# Patient Record
Sex: Female | Born: 1980 | Race: White | Hispanic: No | Smoking: Former smoker
Health system: Southern US, Community
[De-identification: ages and names within clinical notes are randomized; demographics above are authoritative.]

## PROBLEM LIST (undated history)

## (undated) VITALS — BP 106/59 | HR 104 | Temp 97.9°F | Resp 16 | Ht 65.5 in | Wt 258.0 lb

## (undated) DIAGNOSIS — F10931 Alcohol use, unspecified with withdrawal delirium: Secondary | ICD-10-CM

## (undated) DIAGNOSIS — F1010 Alcohol abuse, uncomplicated: Secondary | ICD-10-CM

## (undated) DIAGNOSIS — K259 Gastric ulcer, unspecified as acute or chronic, without hemorrhage or perforation: Secondary | ICD-10-CM

## (undated) DIAGNOSIS — F32A Depression, unspecified: Secondary | ICD-10-CM

## (undated) DIAGNOSIS — K219 Gastro-esophageal reflux disease without esophagitis: Secondary | ICD-10-CM

## (undated) DIAGNOSIS — F419 Anxiety disorder, unspecified: Secondary | ICD-10-CM

## (undated) DIAGNOSIS — F1910 Other psychoactive substance abuse, uncomplicated: Secondary | ICD-10-CM

## (undated) DIAGNOSIS — G4700 Insomnia, unspecified: Secondary | ICD-10-CM

## (undated) DIAGNOSIS — G35 Multiple sclerosis: Secondary | ICD-10-CM

## (undated) DIAGNOSIS — F319 Bipolar disorder, unspecified: Secondary | ICD-10-CM

## (undated) DIAGNOSIS — F329 Major depressive disorder, single episode, unspecified: Secondary | ICD-10-CM

## (undated) DIAGNOSIS — Z9289 Personal history of other medical treatment: Secondary | ICD-10-CM

## (undated) DIAGNOSIS — F10231 Alcohol dependence with withdrawal delirium: Secondary | ICD-10-CM

## (undated) DIAGNOSIS — R110 Nausea: Secondary | ICD-10-CM

## (undated) DIAGNOSIS — G8929 Other chronic pain: Secondary | ICD-10-CM

## (undated) DIAGNOSIS — K529 Noninfective gastroenteritis and colitis, unspecified: Secondary | ICD-10-CM

## (undated) DIAGNOSIS — G43909 Migraine, unspecified, not intractable, without status migrainosus: Secondary | ICD-10-CM

## (undated) HISTORY — DX: Other chronic pain: G89.29

## (undated) HISTORY — DX: Nausea: R11.0

## (undated) HISTORY — DX: Anxiety disorder, unspecified: F41.9

## (undated) HISTORY — PX: GASTRIC BYPASS: SHX52

## (undated) HISTORY — DX: Migraine, unspecified, not intractable, without status migrainosus: G43.909

## (undated) HISTORY — PX: DG TEETH FULL: HXRAD118

## (undated) HISTORY — DX: Multiple sclerosis: G35

## (undated) HISTORY — DX: Insomnia, unspecified: G47.00

## (undated) HISTORY — DX: Personal history of other medical treatment: Z92.89

## (undated) HISTORY — DX: Noninfective gastroenteritis and colitis, unspecified: K52.9

## (undated) HISTORY — DX: Bipolar disorder, unspecified: F31.9

## (undated) HISTORY — DX: Gastric ulcer, unspecified as acute or chronic, without hemorrhage or perforation: K25.9

## (undated) HISTORY — PX: BACK SURGERY: SHX140

---

## 2001-10-29 HISTORY — PX: DIAGNOSTIC LAPAROSCOPY: SUR761

## 2003-10-30 DIAGNOSIS — G35 Multiple sclerosis: Secondary | ICD-10-CM

## 2003-10-30 HISTORY — DX: Multiple sclerosis: G35

## 2009-08-02 ENCOUNTER — Emergency Department (HOSPITAL_COMMUNITY): Admission: EM | Admit: 2009-08-02 | Discharge: 2009-08-02 | Payer: Self-pay | Admitting: Emergency Medicine

## 2009-11-09 ENCOUNTER — Ambulatory Visit: Payer: Self-pay | Admitting: Internal Medicine

## 2009-11-09 ENCOUNTER — Encounter (INDEPENDENT_AMBULATORY_CARE_PROVIDER_SITE_OTHER): Payer: Self-pay | Admitting: Family Medicine

## 2009-11-09 LAB — CONVERTED CEMR LAB
AST: 38 units/L — ABNORMAL HIGH (ref 0–37)
Alkaline Phosphatase: 88 units/L (ref 39–117)
Anti Nuclear Antibody(ANA): NEGATIVE
BUN: 11 mg/dL (ref 6–23)
Calcium: 9.3 mg/dL (ref 8.4–10.5)
Chloride: 101 meq/L (ref 96–112)
Creatinine, Ser: 0.74 mg/dL (ref 0.40–1.20)
Eosinophils Relative: 2 % (ref 0–5)
Glucose, Bld: 71 mg/dL (ref 70–99)
HCT: 36.1 % (ref 36.0–46.0)
Lymphocytes Relative: 29 % (ref 12–46)
MCV: 77.3 fL — ABNORMAL LOW (ref 78.0–100.0)
Monocytes Absolute: 0.6 10*3/uL (ref 0.1–1.0)
Monocytes Relative: 7 % (ref 3–12)
Platelets: 431 10*3/uL — ABNORMAL HIGH (ref 150–400)
Potassium: 4.4 meq/L (ref 3.5–5.3)
Rhuematoid fact SerPl-aCnc: <20 intl units/mL (ref 0–20)
Sed Rate: 8 mm/hr (ref 0–22)
Sodium: 137 meq/L (ref 135–145)
Total CK: 64 units/L (ref 7–177)
Total Protein: 7 g/dL (ref 6.0–8.3)

## 2009-12-29 ENCOUNTER — Ambulatory Visit: Payer: Self-pay | Admitting: Internal Medicine

## 2010-02-13 ENCOUNTER — Encounter (INDEPENDENT_AMBULATORY_CARE_PROVIDER_SITE_OTHER): Payer: Self-pay | Admitting: Family Medicine

## 2010-02-13 ENCOUNTER — Ambulatory Visit: Payer: Self-pay | Admitting: Internal Medicine

## 2010-02-13 LAB — CONVERTED CEMR LAB
Cholesterol: 147 mg/dL (ref 0–200)
Triglycerides: 62 mg/dL (ref <150)
VLDL: 12 mg/dL (ref 0–40)

## 2010-02-23 ENCOUNTER — Ambulatory Visit: Payer: Self-pay | Admitting: Family Medicine

## 2010-03-01 ENCOUNTER — Ambulatory Visit (HOSPITAL_COMMUNITY): Admission: RE | Admit: 2010-03-01 | Discharge: 2010-03-01 | Payer: Self-pay | Admitting: Family Medicine

## 2010-03-07 ENCOUNTER — Ambulatory Visit (HOSPITAL_COMMUNITY): Admission: RE | Admit: 2010-03-07 | Discharge: 2010-03-07 | Payer: Self-pay | Admitting: Family Medicine

## 2010-03-16 ENCOUNTER — Ambulatory Visit: Payer: Self-pay | Admitting: Family Medicine

## 2010-04-25 ENCOUNTER — Ambulatory Visit: Payer: Self-pay | Admitting: Internal Medicine

## 2010-04-25 ENCOUNTER — Encounter (INDEPENDENT_AMBULATORY_CARE_PROVIDER_SITE_OTHER): Payer: Self-pay | Admitting: Family Medicine

## 2010-04-25 LAB — CONVERTED CEMR LAB
Hemoglobin: 12.2 g/dL (ref 12.0–15.0)
Lymphocytes Relative: 23 % (ref 12–46)
MCV: 79.6 fL (ref 78.0–100.0)
Monocytes Relative: 9 % (ref 3–12)
Neutro Abs: 5.6 10*3/uL (ref 1.7–7.7)
Neutrophils Relative %: 66 % (ref 43–77)
RDW: 14.3 % (ref 11.5–15.5)

## 2010-12-03 ENCOUNTER — Emergency Department (HOSPITAL_COMMUNITY)
Admission: EM | Admit: 2010-12-03 | Discharge: 2010-12-03 | Disposition: A | Discharge status: Home / Self Care | Payer: Self-pay | Attending: Emergency Medicine | Admitting: Emergency Medicine

## 2010-12-03 DIAGNOSIS — N12 Tubulo-interstitial nephritis, not specified as acute or chronic: Secondary | ICD-10-CM | POA: Insufficient documentation

## 2010-12-03 DIAGNOSIS — R319 Hematuria, unspecified: Secondary | ICD-10-CM | POA: Insufficient documentation

## 2010-12-03 DIAGNOSIS — R509 Fever, unspecified: Secondary | ICD-10-CM | POA: Insufficient documentation

## 2010-12-03 DIAGNOSIS — R109 Unspecified abdominal pain: Secondary | ICD-10-CM | POA: Insufficient documentation

## 2010-12-03 DIAGNOSIS — R112 Nausea with vomiting, unspecified: Secondary | ICD-10-CM | POA: Insufficient documentation

## 2010-12-03 DIAGNOSIS — R300 Dysuria: Secondary | ICD-10-CM | POA: Insufficient documentation

## 2010-12-03 DIAGNOSIS — G35 Multiple sclerosis: Secondary | ICD-10-CM | POA: Insufficient documentation

## 2010-12-03 LAB — URINALYSIS, ROUTINE W REFLEX MICROSCOPIC
Bilirubin Urine: NEGATIVE
Nitrite: NEGATIVE
Protein, ur: NEGATIVE mg/dL
Specific Gravity, Urine: 1.012 (ref 1.005–1.030)
Urine Glucose, Fasting: NEGATIVE mg/dL
pH: 7.5 (ref 5.0–8.0)

## 2010-12-03 LAB — URINE MICROSCOPIC-ADD ON

## 2010-12-03 LAB — DIFFERENTIAL
Eosinophils Absolute: 0.2 10*3/uL (ref 0.0–0.7)
Lymphocytes Relative: 18 % (ref 12–46)
Lymphs Abs: 1.7 10*3/uL (ref 0.7–4.0)
Monocytes Absolute: 0.9 10*3/uL (ref 0.1–1.0)
Neutrophils Relative %: 71 % (ref 43–77)

## 2010-12-03 LAB — BASIC METABOLIC PANEL
BUN: 7 mg/dL (ref 6–23)
CO2: 26 mEq/L (ref 19–32)
Calcium: 8.8 mg/dL (ref 8.4–10.5)
Chloride: 106 mEq/L (ref 96–112)
GFR calc non Af Amer: >60 mL/min (ref >60)
Potassium: 3.9 mEq/L (ref 3.5–5.1)

## 2010-12-03 LAB — POCT PREGNANCY, URINE: Preg Test, Ur: NEGATIVE

## 2010-12-03 LAB — CBC
HCT: 37 % (ref 36.0–46.0)
MCHC: 31.6 g/dL (ref 30.0–36.0)
Platelets: 451 10*3/uL — ABNORMAL HIGH (ref 150–400)
WBC: 9.5 10*3/uL (ref 4.0–10.5)

## 2010-12-04 LAB — URINE CULTURE
Colony Count: NO GROWTH
Culture  Setup Time: 201202051953

## 2011-01-14 ENCOUNTER — Emergency Department (HOSPITAL_COMMUNITY)
Admission: EM | Admit: 2011-01-14 | Discharge: 2011-01-14 | Disposition: A | Discharge status: Home / Self Care | Payer: Self-pay | Attending: Emergency Medicine | Admitting: Emergency Medicine

## 2011-01-14 DIAGNOSIS — J029 Acute pharyngitis, unspecified: Secondary | ICD-10-CM | POA: Insufficient documentation

## 2011-01-14 DIAGNOSIS — J3489 Other specified disorders of nose and nasal sinuses: Secondary | ICD-10-CM | POA: Insufficient documentation

## 2011-01-14 DIAGNOSIS — R6889 Other general symptoms and signs: Secondary | ICD-10-CM | POA: Insufficient documentation

## 2011-01-14 DIAGNOSIS — R490 Dysphonia: Secondary | ICD-10-CM | POA: Insufficient documentation

## 2011-01-14 DIAGNOSIS — G35 Multiple sclerosis: Secondary | ICD-10-CM | POA: Insufficient documentation

## 2011-02-01 LAB — URINALYSIS, ROUTINE W REFLEX MICROSCOPIC
Glucose, UA: NEGATIVE mg/dL
Nitrite: NEGATIVE
Protein, ur: NEGATIVE mg/dL
Specific Gravity, Urine: 1.019 (ref 1.005–1.030)
Urobilinogen, UA: 1 mg/dL (ref 0.0–1.0)
pH: 7.5 (ref 5.0–8.0)

## 2011-02-01 LAB — BASIC METABOLIC PANEL
CO2: 27 mEq/L (ref 19–32)
Calcium: 9.2 mg/dL (ref 8.4–10.5)
Creatinine, Ser: 0.61 mg/dL (ref 0.4–1.2)
GFR calc non Af Amer: >60 mL/min (ref >60)
Potassium: 4.1 mEq/L (ref 3.5–5.1)
Sodium: 138 mEq/L (ref 135–145)

## 2011-02-01 LAB — DIFFERENTIAL
Basophils Absolute: 0 10*3/uL (ref 0.0–0.1)
Basophils Relative: 1 % (ref 0–1)
Eosinophils Absolute: 0.1 10*3/uL (ref 0.0–0.7)
Eosinophils Relative: 2 % (ref 0–5)
Monocytes Absolute: 0.5 10*3/uL (ref 0.1–1.0)
Monocytes Relative: 8 % (ref 3–12)

## 2011-02-01 LAB — ABO/RH: ABO/RH(D): A POS

## 2011-02-01 LAB — CBC
HCT: 33.1 % — ABNORMAL LOW (ref 36.0–46.0)
MCV: 74.8 fL — ABNORMAL LOW (ref 78.0–100.0)

## 2011-02-01 LAB — HCG, QUANTITATIVE, PREGNANCY: hCG, Beta Chain, Quant, S: <2 m[IU]/mL (ref <5)

## 2011-02-01 LAB — URINE MICROSCOPIC-ADD ON

## 2011-02-01 LAB — POCT PREGNANCY, URINE: Preg Test, Ur: NEGATIVE

## 2011-02-01 LAB — GC/CHLAMYDIA PROBE AMP, GENITAL: Chlamydia, DNA Probe: NEGATIVE

## 2011-02-01 LAB — WET PREP, GENITAL: Trich, Wet Prep: NONE SEEN

## 2011-03-21 ENCOUNTER — Emergency Department (HOSPITAL_COMMUNITY)
Admission: EM | Admit: 2011-03-21 | Discharge: 2011-03-21 | Disposition: A | Discharge status: Home / Self Care | Payer: Self-pay | Attending: Emergency Medicine | Admitting: Emergency Medicine

## 2011-03-21 ENCOUNTER — Emergency Department (HOSPITAL_COMMUNITY): Payer: Self-pay

## 2011-03-21 DIAGNOSIS — R112 Nausea with vomiting, unspecified: Secondary | ICD-10-CM | POA: Insufficient documentation

## 2011-03-21 DIAGNOSIS — K259 Gastric ulcer, unspecified as acute or chronic, without hemorrhage or perforation: Secondary | ICD-10-CM | POA: Insufficient documentation

## 2011-03-21 DIAGNOSIS — R1013 Epigastric pain: Secondary | ICD-10-CM | POA: Insufficient documentation

## 2011-03-21 DIAGNOSIS — G35 Multiple sclerosis: Secondary | ICD-10-CM | POA: Insufficient documentation

## 2011-03-21 LAB — COMPREHENSIVE METABOLIC PANEL
ALT: 9 U/L (ref 0–35)
AST: 15 U/L (ref 0–37)
Alkaline Phosphatase: 76 U/L (ref 39–117)
CO2: 26 mEq/L (ref 19–32)
Chloride: 102 mEq/L (ref 96–112)
GFR calc non Af Amer: >60 mL/min (ref >60)
Glucose, Bld: 82 mg/dL (ref 70–99)
Potassium: 4 mEq/L (ref 3.5–5.1)
Sodium: 137 mEq/L (ref 135–145)
Total Bilirubin: 0.2 mg/dL — ABNORMAL LOW (ref 0.3–1.2)

## 2011-03-21 LAB — DIFFERENTIAL
Basophils Absolute: 0.1 10*3/uL (ref 0.0–0.1)
Lymphocytes Relative: 20 % (ref 12–46)
Monocytes Absolute: 0.9 10*3/uL (ref 0.1–1.0)
Neutro Abs: 7 10*3/uL (ref 1.7–7.7)
Neutrophils Relative %: 67 % (ref 43–77)

## 2011-03-21 LAB — CBC
HCT: 37.9 % (ref 36.0–46.0)
Hemoglobin: 11.7 g/dL — ABNORMAL LOW (ref 12.0–15.0)
MCHC: 30.9 g/dL (ref 30.0–36.0)
RBC: 5.03 MIL/uL (ref 3.87–5.11)
WBC: 10.5 10*3/uL (ref 4.0–10.5)

## 2011-03-21 LAB — URINALYSIS, ROUTINE W REFLEX MICROSCOPIC
Bilirubin Urine: NEGATIVE
Ketones, ur: NEGATIVE mg/dL
Nitrite: NEGATIVE
pH: 6.5 (ref 5.0–8.0)

## 2011-03-21 LAB — POCT PREGNANCY, URINE: Preg Test, Ur: NEGATIVE

## 2011-03-21 LAB — LIPASE, BLOOD: Lipase: 29 U/L (ref 11–59)

## 2011-03-21 LAB — URINE MICROSCOPIC-ADD ON

## 2011-03-21 MED ORDER — IOHEXOL 300 MG/ML  SOLN
80.0000 mL | Freq: Once | INTRAMUSCULAR | Status: AC | PRN
  Administered 2011-03-21: 80 mL via INTRAVENOUS

## 2011-03-22 ENCOUNTER — Telehealth: Payer: Self-pay | Admitting: Gastroenterology

## 2011-03-22 NOTE — Telephone Encounter (Signed)
30 yo woman, new to GI, was in ER last night with 3 days of abd burning.  Labs, CT all negative, pt says she's had ulcers before (also s/p bariactric surgery).  She was being put on PPI twice daily.  She needs NGI apt with first avail MD. thanks

## 2012-03-10 ENCOUNTER — Encounter (HOSPITAL_COMMUNITY): Payer: Self-pay | Admitting: Emergency Medicine

## 2012-03-10 ENCOUNTER — Emergency Department (HOSPITAL_COMMUNITY)
Admission: EM | Admit: 2012-03-10 | Discharge: 2012-03-10 | Disposition: A | Discharge status: Home / Self Care | Payer: BC Managed Care – PPO | Attending: Emergency Medicine | Admitting: Emergency Medicine

## 2012-03-10 ENCOUNTER — Emergency Department (HOSPITAL_COMMUNITY): Payer: BC Managed Care – PPO

## 2012-03-10 DIAGNOSIS — R109 Unspecified abdominal pain: Secondary | ICD-10-CM | POA: Insufficient documentation

## 2012-03-10 DIAGNOSIS — N939 Abnormal uterine and vaginal bleeding, unspecified: Secondary | ICD-10-CM

## 2012-03-10 DIAGNOSIS — N949 Unspecified condition associated with female genital organs and menstrual cycle: Secondary | ICD-10-CM | POA: Insufficient documentation

## 2012-03-10 DIAGNOSIS — R102 Pelvic and perineal pain: Secondary | ICD-10-CM

## 2012-03-10 DIAGNOSIS — N898 Other specified noninflammatory disorders of vagina: Secondary | ICD-10-CM | POA: Insufficient documentation

## 2012-03-10 DIAGNOSIS — R10819 Abdominal tenderness, unspecified site: Secondary | ICD-10-CM | POA: Insufficient documentation

## 2012-03-10 HISTORY — DX: Gastro-esophageal reflux disease without esophagitis: K21.9

## 2012-03-10 LAB — URINE MICROSCOPIC-ADD ON

## 2012-03-10 LAB — WET PREP, GENITAL: Yeast Wet Prep HPF POC: NONE SEEN

## 2012-03-10 LAB — URINALYSIS, ROUTINE W REFLEX MICROSCOPIC
Bilirubin Urine: NEGATIVE
Glucose, UA: NEGATIVE mg/dL
Protein, ur: NEGATIVE mg/dL
Urobilinogen, UA: 0.2 mg/dL (ref 0.0–1.0)

## 2012-03-10 MED ORDER — OXYCODONE-ACETAMINOPHEN 5-325 MG PO TABS
2.0000 | ORAL_TABLET | Freq: Once | ORAL | Status: AC
  Administered 2012-03-10: 2 via ORAL
  Filled 2012-03-10: qty 2

## 2012-03-10 MED ORDER — OXYCODONE-ACETAMINOPHEN 5-325 MG PO TABS: 1.0000 | ORAL_TABLET | Freq: Four times a day (QID) | ORAL | Status: DC | PRN

## 2012-03-10 NOTE — Discharge Instructions (Signed)
Abnormal Vaginal Bleeding Abnormal vaginal bleeding means bleeding from the vagina that is not your normal menstrual period. Bleeding may be heavy or light. It may last for days or come and go. There are many problems that may cause this. HOME CARE  Keep track of your periods on a calendar if they are not regular.   Write down:   How often pads or tampons are changed.   The size and number of clots, if there are any.   A change in the color of the blood.   A change in the amount of blood.   Any smell.   The time and strength of cramps or pain.   Limit activity as told.   Eat a healthy diet.   Do not have sex (intercourse) until your doctor says it is okay.   Never have unprotected sex unless you are trying to get pregnant.   Only take medicine as told by your doctor.  GET HELP RIGHT AWAY IF:   You get dizzy or feel faint when standing up.   You have to change pads or tampons more than once an hour.   You feel a sudden change in your pain.   You start bleeding heavily.   You develop a fever.  MAKE SURE YOU:  Understand these instructions.   Will watch your condition.   Will get help right away if you are not doing well or get worse.  Document Released: 08/12/2009 Document Revised: 10/04/2011 Document Reviewed: 08/12/2009 [facility] Patient Information 2012 [place], [place].   It is really important for you to follow up with Gynecology for further evaluation.  Take pain medication for severe pain.  Do not drive or operate heavy machinery while taking medication.

## 2012-03-10 NOTE — ED Notes (Signed)
Reports last period 5 months ago, has always had irregular periods. States had spotting for 2-3 days then stopped. Last night started period with heavy bleeding & cramping & lower abd pain. Reports changing super tampon every 45-60 min. States this is the heaviest bleeding & cramping/pain she's ever had. Currently not on any birth control.

## 2012-03-10 NOTE — ED Notes (Addendum)
Patient transported to Ultrasound prior to receiving meds

## 2012-03-10 NOTE — ED Notes (Signed)
Was told she could not get preg has been having unprotected  intercourse

## 2012-03-10 NOTE — ED Notes (Signed)
Patient transported to Ultrasound 

## 2012-03-10 NOTE — ED Notes (Signed)
Periods have been irreg periods and then this past week  Has been worse with cramping and lots of blood

## 2012-03-10 NOTE — ED Provider Notes (Signed)
History     CSN: [id number]  Arrival date & time 03/10/12  1151   First MD Initiated Contact with Patient 03/10/12 1506      Chief Complaint  Patient presents with  . Vaginal Bleeding    (Consider location/radiation/quality/duration/timing/severity/associated sxs/prior treatment) HPI Comments: Patient reports that last evening she began having vaginal bleeding.  She reports that the bleeding was more than what she typically has with her menstrual periods.  Five days ago she had some vaginal spotting for two days.  She also reports that this morning she began having cramping of her lower back and pelvic area.  Cramping has been continuous since that time and is becoming progressively worse.  She denies any passage of blood clots or tissue.  She reports that her last normal period was in January. She reports that her menstrual periods are typically irregular and she only has periods every 2-3 months.  She is G0P0.  She reports that years ago she underwent extensive testing with a Gynecologist years ago and was told that she could not get pregnant because of "scar tissue"  Aside from gastric bypass she has not had any other surgeries.  She is sexually active with her husband and does not use protection.  She is not on any form of birth control.  She also reports that she had one episode of vomiting last evening.  No vomiting today.  Denies fever or chills.  Denies any past history of STD's or PID.    Patient is a 31 y.o. female presenting with vaginal bleeding. The history is provided by the patient.  Vaginal Bleeding This is a new problem. Episode onset: last evening. Associated symptoms include abdominal pain and vomiting. Pertinent negatives include no chills, fever or nausea.    Past Medical History  Diagnosis Date  . Acid reflux     Past Surgical History  Procedure Date  . Gastric bypass   . Abdominal surgery     No family history on file.  History  Substance Use Topics  .  Smoking status: Former Games developer  . Smokeless tobacco: Not on file  . Alcohol Use: Yes    OB History    Grav Para Term Preterm Abortions TAB SAB Ect Mult Living                  Review of Systems  Constitutional: Negative for fever and chills.  Respiratory: Negative for shortness of breath.   Gastrointestinal: Positive for vomiting and abdominal pain. Negative for nausea, diarrhea, constipation, blood in stool and abdominal distention.       Abdominal cramping  Genitourinary: Positive for vaginal bleeding and pelvic pain. Negative for dysuria, hematuria, flank pain, vaginal discharge and dyspareunia.  Neurological: Negative for dizziness, syncope and light-headedness.  Hematological: Does not bruise/bleed easily.    Allergies  Review of patient's allergies indicates no known allergies.  Home Medications   Current Outpatient Rx  Name Route Sig Dispense Refill  . NAPROXEN SODIUM 220 MG PO TABS Oral Take 220 mg by mouth 3 (three) times daily as needed. For headaches    . TRAMADOL HCL 50 MG PO TABS Oral Take 50 mg by mouth 3 (three) times daily as needed. For pain      BP 121/75  Pulse 66  Temp(Src) 97.4 F (36.3 C) (Oral)  Resp 20  SpO2 100%  Physical Exam  Nursing note and vitals reviewed. Constitutional: She appears well-developed and well-nourished. No distress.  HENT:  Head: Normocephalic and  atraumatic.  Mouth/Throat: Oropharynx is clear and moist.  Cardiovascular: Normal rate, regular rhythm and normal heart sounds.   Pulmonary/Chest: Effort normal and breath sounds normal.  Abdominal: Soft. Bowel sounds are normal. She exhibits no distension and no mass. There is tenderness in the suprapubic area. There is no rigidity, no rebound and no guarding.  Genitourinary: Uterus normal. There is no rash, tenderness or lesion on the right labia. There is no rash, tenderness or lesion on the left labia. Cervix exhibits no motion tenderness, no discharge and no friability. Right  adnexum displays tenderness. Right adnexum displays no mass and no fullness. Left adnexum displays no mass, no tenderness and no fullness. There is bleeding around the vagina. No erythema or tenderness around the vagina. No signs of injury around the vagina. No vaginal discharge found.       Cervical os closed  Neurological: She is alert.  Skin: Skin is warm and dry. She is not diaphoretic.  Psychiatric: She has a normal mood and affect.    ED Course  Procedures (including critical care time)  Labs Reviewed  URINALYSIS, ROUTINE W REFLEX MICROSCOPIC - Abnormal; Notable for the following:    APPearance CLOUDY (*)    Hgb urine dipstick MODERATE (*)    All other components within normal limits  URINE MICROSCOPIC-ADD ON - Abnormal; Notable for the following:    Bacteria, UA FEW (*)    All other components within normal limits  POCT PREGNANCY, URINE  GC/CHLAMYDIA PROBE AMP, GENITAL  WET PREP, GENITAL   US Transvaginal Non-ob  03/10/2012  *RADIOLOGY REPORT*  Clinical Data: Vaginal bleeding  TRANSABDOMINAL AND TRANSVAGINAL ULTRASOUND OF PELVIS Technique:  Both transabdominal and transvaginal ultrasound examinations of the pelvis were performed. Transabdominal technique was performed for global imaging of the pelvis including uterus, ovaries, adnexal regions, and pelvic cul-de-sac. Transabdominal images limited by lack of adequate bladder distention and resultant poor acoustic window.  Comparison: 03/01/2010   It was necessary to proceed with endovaginal exam following the transabdominal exam to visualize the uterus, endometrium, and ovaries.  Findings:  Uterus: Normal in size and position, 5.8 cm length by 3.3 cm AP by 3.6 cm transverse.  No definite intrauterine mass.  Endometrium: 3 mm thick, normal.  However and lower uterine segment the endometrial complex is focally thickened up to 5.3 mm thick and is heterogeneous echogenicity over up proximally 13 mm length, question blood, debris, or polyp.   Right ovary:  Normal size and morphology, 2.7 x 2.2 x 2.2 cm.  Left ovary: 4.1 x 3.8 x 4.8 cm. Minimally complicated cyst left ovary 3.1 x 3.0 x 3.3 cm containing a single thin septation.  Other findings: Small amount of nonspecific free pelvic fluid.  Superior to the uterus is a hypoechoic collection measuring 3.0 x 2.5 x 3.0 cm in size.  This is adjacent to the urinary bladder without definite direct communication and abuts the uterusand is in close proximity to the left ovary.  This appears to exert mass effect on surrounding tissues and is not felt to represent free pelvic fluid.  IMPRESSION: Normal appearing uterus and right ovary. Minimally complicated left ovary 3.3 cm greatest size. Focal collection of heterogeneous echogenicity within the lower uterine segment 5.3 x 13 mm in size, question blood, less likely polyp. Focal question fluid collection adjacent to the superior aspect of the uterus and urinary bladder, 3.0 x 2.5 x 3.0 cm in size. Etiology is uncertain. This is not definitely visualized on the prior CT and  ultrasound exams Differential diagnosis includes hydrosalpinx, bladder diverticulum, nonspecific pelvic cyst, exophytic left ovarian cyst or paraovarian cyst. This is felt to unlikely be related to degenerated leiomyoma or endometrioma. Recommend localization and characterization of this lesion by MR imaging with and without contrast.  Original Report Authenticated By: Lollie Marrow, M.D.   US Pelvis Complete  03/10/2012  *RADIOLOGY REPORT*  Clinical Data: Vaginal bleeding  TRANSABDOMINAL AND TRANSVAGINAL ULTRASOUND OF PELVIS Technique:  Both transabdominal and transvaginal ultrasound examinations of the pelvis were performed. Transabdominal technique was performed for global imaging of the pelvis including uterus, ovaries, adnexal regions, and pelvic cul-de-sac. Transabdominal images limited by lack of adequate bladder distention and resultant poor acoustic window.  Comparison: 03/01/2010    It was necessary to proceed with endovaginal exam following the transabdominal exam to visualize the uterus, endometrium, and ovaries.  Findings:  Uterus: Normal in size and position, 5.8 cm length by 3.3 cm AP by 3.6 cm transverse.  No definite intrauterine mass.  Endometrium: 3 mm thick, normal.  However and lower uterine segment the endometrial complex is focally thickened up to 5.3 mm thick and is heterogeneous echogenicity over up proximally 13 mm length, question blood, debris, or polyp.  Right ovary:  Normal size and morphology, 2.7 x 2.2 x 2.2 cm.  Left ovary: 4.1 x 3.8 x 4.8 cm. Minimally complicated cyst left ovary 3.1 x 3.0 x 3.3 cm containing a single thin septation.  Other findings: Small amount of nonspecific free pelvic fluid.  Superior to the uterus is a hypoechoic collection measuring 3.0 x 2.5 x 3.0 cm in size.  This is adjacent to the urinary bladder without definite direct communication and abuts the uterusand is in close proximity to the left ovary.  This appears to exert mass effect on surrounding tissues and is not felt to represent free pelvic fluid.  IMPRESSION: Normal appearing uterus and right ovary. Minimally complicated left ovary 3.3 cm greatest size. Focal collection of heterogeneous echogenicity within the lower uterine segment 5.3 x 13 mm in size, question blood, less likely polyp. Focal question fluid collection adjacent to the superior aspect of the uterus and urinary bladder, 3.0 x 2.5 x 3.0 cm in size. Etiology is uncertain. This is not definitely visualized on the prior CT and ultrasound exams Differential diagnosis includes hydrosalpinx, bladder diverticulum, nonspecific pelvic cyst, exophytic left ovarian cyst or paraovarian cyst. This is felt to unlikely be related to degenerated leiomyoma or endometrioma. Recommend localization and characterization of this lesion by MR imaging with and without contrast.  Original Report Authenticated By: Lollie Marrow, M.D.     No  diagnosis found.    MDM  Patient comes in today with pelvic cramping and vaginal bleeding.  Right sided adnexal pain on pelvic exam.  Therefore, pelvic ultrasound ordered.  Urine pregnancy negative.  Results of the ultrasound are listed above.  Discussed results with Dr. Effie Shy.  Feel that patient can follow up with Gynecology outpatient.  Patient given referral.  Patient in agreement with the plan.        Pascal Lux Cleary, PA-C 03/11/12 1422

## 2012-03-11 LAB — GC/CHLAMYDIA PROBE AMP, GENITAL: Chlamydia, DNA Probe: NEGATIVE

## 2012-03-11 NOTE — ED Provider Notes (Signed)
Medical screening examination/treatment/procedure(s) were performed by non-physician practitioner and as supervising physician I was immediately available for consultation/collaboration.  Flint Melter, MD 03/11/12 [phone]

## 2012-03-13 ENCOUNTER — Encounter: Payer: Self-pay | Admitting: Obstetrics and Gynecology

## 2012-03-13 ENCOUNTER — Ambulatory Visit (INDEPENDENT_AMBULATORY_CARE_PROVIDER_SITE_OTHER): Payer: BC Managed Care – PPO | Admitting: Obstetrics and Gynecology

## 2012-03-13 VITALS — BP 110/58 | Wt 224.0 lb

## 2012-03-13 DIAGNOSIS — Z9884 Bariatric surgery status: Secondary | ICD-10-CM | POA: Insufficient documentation

## 2012-03-13 DIAGNOSIS — N938 Other specified abnormal uterine and vaginal bleeding: Secondary | ICD-10-CM

## 2012-03-13 DIAGNOSIS — R102 Pelvic and perineal pain: Secondary | ICD-10-CM

## 2012-03-13 DIAGNOSIS — G35 Multiple sclerosis: Secondary | ICD-10-CM

## 2012-03-13 DIAGNOSIS — N949 Unspecified condition associated with female genital organs and menstrual cycle: Secondary | ICD-10-CM

## 2012-03-13 DIAGNOSIS — N926 Irregular menstruation, unspecified: Secondary | ICD-10-CM

## 2012-03-13 LAB — PROLACTIN: Prolactin: 8.8 ng/mL

## 2012-03-13 LAB — FOLLICLE STIMULATING HORMONE: FSH: 4.4 m[IU]/mL

## 2012-03-13 MED ORDER — PROGESTERONE MICRONIZED 200 MG PO CAPS: 200.0000 mg | ORAL_CAPSULE | Freq: Every day | ORAL | Status: DC

## 2012-03-13 MED ORDER — KETOROLAC TROMETHAMINE 30 MG/ML IJ SOLN
30.0000 mg | Freq: Once | INTRAMUSCULAR | Status: AC
  Administered 2012-03-13: 30 mg via INTRAMUSCULAR

## 2012-03-13 MED ORDER — KETOROLAC TROMETHAMINE 10 MG PO TABS: 10.0000 mg | ORAL_TABLET | Freq: Four times a day (QID) | ORAL | Status: AC | PRN

## 2012-03-13 NOTE — Progress Notes (Signed)
Contraception: none History of STD:  Beth [name] history of PID, STD's History of ovarian cyst: yes:  03/10/12 History of fibroids: Beth [name] History of endometriosis:Beth [name] Previous ultrasound:yes:  03/10/12  Urinary symptoms: none Gastro-intestinal symptoms:  Constipation: Beth [name]     Diarrhea: yes     Nausea: yes     Vomiting: yes     Fever: Beth [name] Vaginal discharge: Beth [name] vaginal discharge   Subjective:     Beth Beth [name] is a 31 y.o. female Beth [name] obstetric history on file. who presents with complaints of abdominal/pelvic pain.   Onset of symptoms was abrupt starting 2 days ago and is controlled.The pain is located in the LUQ and in the low pelvis and is described as cramping and sharp with an intensity of 9 on a 10 point pain scale. The pain does radiate to back. The pain occurs prior to onset of menses after 5 months of amenorrhea. Symptoms improve with standing and Percocet and worsen with movement and full bladder.First pain episode of this kind. Cycles were normal until August 2012 where cycle became irregular and unpredictable.Positive family history of thyroid disorder.  Seen in ER. Ultrasound 03/10/12:  Uterus: Normal in size and position, 5.8 cm length by 3.3 cm AP by 3.6 cm transverse. Beth [name] definite intrauterine mass. Endometrium: 3 mm thick, normal. However and lower uterine segment the endometrial complex is focally thickened up to 5.3 mm thick and is heterogeneous echogenicity over up proximally 13 mm length, question blood, debris, or polyp. Right ovary: Normal size and morphology, 2.7 x 2.2 x 2.2 cm. Left ovary: 4.1 x 3.8 x 4.8 cm. Minimally complicated cyst left ovary 3.1 x 3.0 x 3.3 cm containing a single thin septation. Other findings: Small amount of nonspecific free pelvic fluid. Superior to the uterus is a hypoechoic collection measuring 3.0 x 2.5 x 3.0 cm in size. This is adjacent to the urinary bladder without definite direct communication and abuts the uterusand is in close proximity to the left ovary.  This appears to exert mass effect on surrounding tissues and is not felt to represent free pelvic fluid.     She does desire further childbearing  Associated symptoms: nausea and vomiting Family history: positive for Thyroid disorder, endometriosis and fibroid (mother)  Pertinent gyn history:   Menses: see HPI  PMH: Past Medical History  Diagnosis Date  . Acid reflux   . Asthma   . History of blood transfusion      Medication: (Not in a hospital admission) Allergies:Beth [name] Known Allergies    Review of systems:   Pertinent items are noted in HPI.  Objective:    BP 110/58  Wt 224 lb (101.606 kg)  LMP 02/27/2012  Weight: Wt Readings from Last 1 Encounters:  03/13/12 224 lb (101.606 kg)    BMI: There is Beth [name] height on file to calculate BMI.  General Appearance: Alert, appropriate appearance for age. Beth [name] acute distress HEENT: Grossly normal Neck / Thyroid: Supple, Beth [name] masses, nodes or enlargement Lungs: clear to auscultation bilaterally Back: Beth [name] CVA tenderness Cardiovascular: Regular rate and rhythm. S1, S2, Beth [name] murmur Gastrointestinal: Soft, non-tender, Beth [name] masses or organomegaly Pelvic Exam: Vulva and vagina appear normal. Bimanual exam reveals normal uterus and adnexa. Rectovaginal: not indicated       Assessment and Plan:   Anovulatory DUB with associated ruptured CL cyst. Improving. Blood tests: TSH, PRL.Progesterone, Free and total testosterone, FSH, FBS, Insulin, CBC Educational material distributed. Pelvic ultrasound in 1 month Prometrium 200 mg daily for 14 days Toradol 30  mg given today Toradol 10 mg every 6 hours PRN   Beth Beth [name] A  MD 5/16/201310:35 AM

## 2012-03-13 NOTE — Patient Instructions (Signed)
Patient Education Materials to be provided at check out (*indicates is located in Garment/textile technologist):  Abnormal Uterine Bleeding, Ovarian Cysts

## 2012-03-14 ENCOUNTER — Other Ambulatory Visit: Payer: Self-pay | Admitting: Obstetrics and Gynecology

## 2012-03-14 ENCOUNTER — Telehealth: Payer: Self-pay | Admitting: Obstetrics and Gynecology

## 2012-03-14 LAB — CBC
HCT: 33.2 % — ABNORMAL LOW (ref 36.0–46.0)
Hemoglobin: 9.9 g/dL — ABNORMAL LOW (ref 12.0–15.0)
MCV: 76.3 fL — ABNORMAL LOW (ref 78.0–100.0)
RDW: 15.5 % (ref 11.5–15.5)
WBC: 5.2 10*3/uL (ref 4.0–10.5)

## 2012-03-14 LAB — TESTOSTERONE, FREE, TOTAL, SHBG
Sex Hormone Binding: 48 nmol/L (ref 18–114)
Testosterone, Free: 8.7 pg/mL — ABNORMAL HIGH (ref 0.6–6.8)
Testosterone: 60.92 ng/dL (ref 10–70)

## 2012-03-14 MED ORDER — HYDROCODONE-ACETAMINOPHEN 5-500 MG PO TABS: 1.0000 | ORAL_TABLET | Freq: Four times a day (QID) | ORAL | Status: DC | PRN

## 2012-03-14 NOTE — Telephone Encounter (Signed)
PT CALLED AND STATES SAW SR YESTERDAY FOR SEVERE ABD PAIN, WAS GIVEN SHOT OF TORADOL WHILE IN OFFICE AND GIVEN RX BEFORE LEAVING OFFICE.  PT SAYS TODAY PAIN IS SEVERE AND TORADOL IS NOT TOUCHING THE PAIN, HAS TAKEN SIX DOSES SO FAR, PER SR CAN CALL IN VICODIN, RX ENTERED IN PT'S CHART AND CALLED TO PT'S SELECTED PHARMACY, PT ADVISED TO CALL OVER WKND WITH ANY QUESTIONS OR CONCERNS.

## 2012-03-14 NOTE — Telephone Encounter (Signed)
Laura/sr pt/epic

## 2012-03-19 ENCOUNTER — Encounter: Payer: BC Managed Care – PPO | Admitting: Obstetrics and Gynecology

## 2012-03-19 ENCOUNTER — Telehealth: Payer: Self-pay | Admitting: Obstetrics and Gynecology

## 2012-03-19 NOTE — Telephone Encounter (Signed)
Deborah/epic

## 2012-03-19 NOTE — Telephone Encounter (Signed)
Pt called cont to have left sided pain vicoden does not help she took some tramadol she had at home it helped very little no blding currently lmp 03-03-12 concerned she continues to hurt and today it is worse,pt also reports liquid diarrhea. consult with SR pt to be seen tomorrow by EP I have attempted to contact this patient by phone with the following results: left message to return my call on answering machine. To pt  Got voice mail left message that she needed an appt and I scheduled her for 3:45p with EP call if that isnot going to work for her . She is to cont the tramadol and heat to abd  And call with any concerns [email or web address]

## 2012-03-20 ENCOUNTER — Other Ambulatory Visit: Payer: Self-pay | Admitting: Obstetrics and Gynecology

## 2012-03-20 ENCOUNTER — Ambulatory Visit (INDEPENDENT_AMBULATORY_CARE_PROVIDER_SITE_OTHER): Payer: BC Managed Care – PPO | Admitting: Obstetrics and Gynecology

## 2012-03-20 ENCOUNTER — Encounter: Payer: Self-pay | Admitting: Obstetrics and Gynecology

## 2012-03-20 ENCOUNTER — Encounter (HOSPITAL_COMMUNITY): Payer: Self-pay | Admitting: *Deleted

## 2012-03-20 ENCOUNTER — Inpatient Hospital Stay (HOSPITAL_COMMUNITY): Payer: BC Managed Care – PPO

## 2012-03-20 ENCOUNTER — Inpatient Hospital Stay (HOSPITAL_COMMUNITY)
Admission: AD | Admit: 2012-03-20 | Discharge: 2012-03-20 | Disposition: A | Discharge status: Home / Self Care | Payer: BC Managed Care – PPO | Source: Ambulatory Visit | Attending: Obstetrics and Gynecology | Admitting: Obstetrics and Gynecology

## 2012-03-20 VITALS — BP 110/70 | Temp 98.0°F | Ht 66.0 in | Wt 226.0 lb

## 2012-03-20 DIAGNOSIS — K219 Gastro-esophageal reflux disease without esophagitis: Secondary | ICD-10-CM | POA: Insufficient documentation

## 2012-03-20 DIAGNOSIS — N8353 Torsion of ovary, ovarian pedicle and fallopian tube: Secondary | ICD-10-CM

## 2012-03-20 DIAGNOSIS — N949 Unspecified condition associated with female genital organs and menstrual cycle: Secondary | ICD-10-CM | POA: Insufficient documentation

## 2012-03-20 DIAGNOSIS — R102 Pelvic and perineal pain: Secondary | ICD-10-CM

## 2012-03-20 DIAGNOSIS — N83209 Unspecified ovarian cyst, unspecified side: Secondary | ICD-10-CM

## 2012-03-20 DIAGNOSIS — Z9189 Other specified personal risk factors, not elsewhere classified: Secondary | ICD-10-CM

## 2012-03-20 DIAGNOSIS — Z9289 Personal history of other medical treatment: Secondary | ICD-10-CM | POA: Insufficient documentation

## 2012-03-20 LAB — POCT URINALYSIS DIPSTICK
Bilirubin, UA: NEGATIVE
Ketones, UA: NEGATIVE
Protein, UA: NEGATIVE
Spec Grav, UA: 1.02

## 2012-03-20 MED ORDER — HYDROMORPHONE HCL 2 MG PO TABS: 2.0000 mg | ORAL_TABLET | ORAL | Status: AC | PRN

## 2012-03-20 MED ORDER — HYDROMORPHONE HCL 2 MG PO TABS
4.0000 mg | ORAL_TABLET | Freq: Once | ORAL | Status: AC
  Administered 2012-03-20: 4 mg via ORAL

## 2012-03-20 MED ORDER — HYDROMORPHONE HCL 2 MG PO TABS
ORAL_TABLET | ORAL | Status: AC
  Administered 2012-03-20: 4 mg via ORAL
  Filled 2012-03-20: qty 2

## 2012-03-20 NOTE — ED Notes (Signed)
Pt got up to BR and started to fel light headed again. Assisted pt back to bed. Notified NP.

## 2012-03-20 NOTE — MAU Note (Signed)
Pt reports having pelvic and back pain for over a week. Has been to doctors office and here several times but has not gotten any better. Having vaginal bleeding irregular

## 2012-03-20 NOTE — Discharge Instructions (Signed)
Ovarian Cyst The ovaries are small organs that are on each side of the uterus. The ovaries are the organs that produce the female hormones, estrogen and progesterone. An ovarian cyst is a sac filled with fluid that can vary in its size. It is normal for a small cyst to form in women who are in the childbearing age and who have menstrual periods. This type of cyst is called a follicle cyst that becomes an ovulation cyst (corpus luteum cyst) after it produces the women's egg. It later goes away on its own if the woman does not become pregnant. There are other kinds of ovarian cysts that may cause problems and may need to be treated. The most serious problem is a cyst with cancer. It should be noted that menopausal women who have an ovarian cyst are at a higher risk of it being a cancer cyst. They should be evaluated very quickly, thoroughly and followed closely. This is especially true in menopausal women because of the high rate of ovarian cancer in women in menopause. CAUSES AND TYPES OF OVARIAN CYSTS:  FUNCTIONAL CYST: The follicle/corpus luteum cyst is a functional cyst that occurs every month during ovulation with the menstrual cycle. They go away with the next menstrual cycle if the woman does not get pregnant. Usually, there are no symptoms with a functional cyst.   ENDOMETRIOMA CYST: This cyst develops from the lining of the uterus tissue. This cyst gets in or on the ovary. It grows every month from the bleeding during the menstrual period. It is also called a "chocolate cyst" because it becomes filled with blood that turns brown. This cyst can cause pain in the lower abdomen during intercourse and with your menstrual period.   CYSTADENOMA CYST: This cyst develops from the cells on the outside of the ovary. They usually are not cancerous. They can get very big and cause lower abdomen pain and pain with intercourse. This type of cyst can twist on itself, cut off its blood supply and cause severe pain.  It also can easily rupture and cause a lot of pain.   DERMOID CYST: This type of cyst is sometimes found in both ovaries. They are found to have different kinds of body tissue in the cyst. The tissue includes skin, teeth, hair, and/or cartilage. They usually do not have symptoms unless they get very big. Dermoid cysts are rarely cancerous.   POLYCYSTIC OVARY: This is a rare condition with hormone problems that produces many small cysts on both ovaries. The cysts are follicle-like cysts that never produce an egg and become a corpus luteum. It can cause an increase in body weight, infertility, acne, increase in body and facial hair and lack of menstrual periods or rare menstrual periods. Many women with this problem develop type 2 diabetes. The exact cause of this problem is unknown. A polycystic ovary is rarely cancerous.   THECA LUTEIN CYST: Occurs when too much hormone (human chorionic gonadotropin) is produced and over-stimulates the ovaries to produce an egg. They are frequently seen when doctors stimulate the ovaries for invitro-fertilization (test tube babies).   LUTEOMA CYST: This cyst is seen during pregnancy. Rarely it can cause an obstruction to the birth canal during labor and delivery. They usually go away after delivery.  SYMPTOMS   Pelvic pain or pressure.   Pain during sexual intercourse.   Increasing girth (swelling) of the abdomen.   Abnormal menstrual periods.   Increasing pain with menstrual periods.   You stop having   menstrual periods and you are not pregnant.  DIAGNOSIS  The diagnosis can be made during:  Routine or annual pelvic examination (common).   Ultrasound.   X-ray of the pelvis.   CT Scan.   MRI.   Blood tests.  TREATMENT   Treatment may only be to follow the cyst monthly for 2 to 3 months with your caregiver. Many go away on their own, especially functional cysts.   May be aspirated (drained) with a long needle with ultrasound, or by laparoscopy  (inserting a tube into the pelvis through a small incision).   The whole cyst can be removed by laparoscopy.   Sometimes the cyst may need to be removed through an incision in the lower abdomen.   Hormone treatment is sometimes used to help dissolve certain cysts.   Birth control pills are sometimes used to help dissolve certain cysts.  HOME CARE INSTRUCTIONS  Follow your caregiver's advice regarding:  Medicine.   Follow up visits to evaluate and treat the cyst.   You may need to come back or make an appointment with another caregiver, to find the exact cause of your cyst, if your caregiver is not a gynecologist.   Get your yearly and recommended pelvic examinations and Pap tests.   Let your caregiver know if you have had an ovarian cyst in the past.  SEEK MEDICAL CARE IF:   Your periods are late, irregular, they stop, or are painful.   Your stomach (abdomen) or pelvic pain does not go away.   Your stomach becomes larger or swollen.   You have pressure on your bladder or trouble emptying your bladder completely.   You have painful sexual intercourse.   You have feelings of fullness, pressure, or discomfort in your stomach.   You lose weight for no apparent reason.   You feel generally ill.   You become constipated.   You lose your appetite.   You develop acne.   You have an increase in body and facial hair.   You are gaining weight, without changing your exercise and eating habits.   You think you are pregnant.  SEEK IMMEDIATE MEDICAL CARE IF:   You have increasing abdominal pain.   You feel sick to your stomach (nausea) and/or vomit.   You develop a fever that comes on suddenly.   You develop abdominal pain during a bowel movement.   Your menstrual periods become heavier than usual.  Document Released: 10/15/2005 Document Revised: 10/04/2011 Document Reviewed: 08/18/2009 [employer] Patient Information 2012 [employer]. 

## 2012-03-20 NOTE — Progress Notes (Signed)
31 YO seen recently with complaints of pelvic pain and irregular bleeding.  CBC-wnl,  except H/H= 9.9/33.2 & PLT 405;  FSH=4.4, Prolactin=8.8, Testosterone=60.92 and TSH=0.732.  Ultrasound showed complex left ovarian cyst with plans to repeat her ultrasound in 6 weeks.  Still complains of stabbing/cramping pain that includes the back, worse with full or empty bladder.  Admits to diarrhea x 2 days but denies fever, nausea or vomiting. Bleeding stopped last Thursday but resumed yesterday (same time the pain worsened). Family history of endometriosis and renal stones.  Patient's pain will decrease pain from a 10/10 to 7/10.  Pain doubles her over and can't sleep.   O: Abdomen:soft, diffusely tender with guarding in both lower quadrants, no rebound PELVIC: EGBUS-wnl (tender), vagina-moderate blood, cervix-no lesions, uterus-unable to assess due to patient discomfort & habitus, adnexae-tenderness left> right  U/A-negative  A: Severe Pelvic Pain     Left Ovarian Cyst     DUB  P: Consulted Dr. Estanislado Pandy, pelvic ultrasound r/o torsion      Dilaudid 2mg   # 30 1-2 every 4 hours no refills      V. Emilee Hero contacted as patient will need to go through     MAU for ultrasound  Sienna Stonehocker, PA-C

## 2012-03-20 NOTE — Progress Notes (Signed)
States pain relief x 1 1/2 hour with dilaudid worse after poking aroutnd for US exam [place] report discussed with Dr. Pennie Rushing per telephone and discussed option of hospitalization with PCA pump for pain management or home with another medication has new RX for dilaudid and wants to go home and use that, will call if sx change, worsen, or unresolved. Lavera Guise, CNM

## 2012-03-20 NOTE — Progress Notes (Signed)
Vaginal Discharge: no Kidney Stones: no Prior Eval: yes last week with sr  Odor: no Constipation: no Prior U/S: yes last monday  Fever: no Diarrhea: yes Hx of Ovarian Cyst: yes  Irreg. Periods: yes Rectal Bleeding: no Hx of STD-PID: no  Dyspareunia: yes Vomiting: yes Appendectomy: no   Dysuria: no Nausea: yes Gall Bladder Ds: no  Frequency: yes Pregnant: no Other: pain on left side and back;feels like a stabbing pain  Urgency: yes Fibroids: no   Hematuria: no Endometriosis: no

## 2012-03-20 NOTE — MAU Provider Note (Signed)
History   31 yo G0 presenting from the office for a pelvic US to R/O ovarian torsion--hx of on-going pelvic pain, with [place] at ER 5/13 showing 3 cm left ovarian complex cyst.  Has had several Rxs for pain, all ineffective.  Has been seen at [facility] by Dr. Estanislado Pandy and Larey Dresser, with plan for follow-up US 6 weeks from the 1st scan.  Chief Complaint  Patient presents with  . Pelvic Pain     OB History    Grav Para Term Preterm Abortions TAB SAB Ect Mult Living   0         0      Past Medical History  Diagnosis Date  . Acid reflux   . Asthma   . History of blood transfusion   . Multiple sclerosis 2005    Past Surgical History  Procedure Date  . Gastric bypass   . Abdominal surgery 2003    weight loss of over 200 lbs    Family History  Problem Relation Age of Onset  . Hypertension Paternal Grandfather   . Colon cancer Paternal Grandfather 65  . Asthma Paternal Grandmother   . Hypertension Paternal Grandmother   . Breast cancer Paternal Grandmother   . Cancer Paternal Grandmother     lung   . Diabetes Maternal Grandmother   . Diabetes Maternal Grandfather   . Hypertension Father   . Urinary tract infection Mother   . Stroke Mother 68    3 strokes     History  Substance Use Topics  . Smoking status: Former Smoker    Quit date: 05/14/2011  . Smokeless tobacco: Never Used  . Alcohol Use: Yes    Allergies: No Known Allergies  Prescriptions prior to admission  Medication Sig Dispense Refill  . HYDROcodone-acetaminophen (VICODIN) 5-500 MG per tablet Take 1-2 tablets by mouth every 6 (six) hours as needed for pain.  30 tablet  0  . HYDROmorphone (DILAUDID) 2 MG tablet Take 1 tablet (2 mg total) by mouth every 4 (four) hours as needed for pain.  30 tablet  0  . naproxen sodium (ANAPROX) 220 MG tablet Take 220 mg by mouth 3 (three) times daily as needed. For headaches      . oxyCODONE-acetaminophen (PERCOCET) 5-325 MG per tablet Take 1-2 tablets by mouth every 6 (six) hours  as needed for pain.  20 tablet  0  . progesterone (PROMETRIUM) 200 MG capsule Take 1 capsule (200 mg total) by mouth daily.  14 capsule  11  . traMADol (ULTRAM) 50 MG tablet Take 50 mg by mouth 3 (three) times daily as needed. For pain         Physical Exam   Blood pressure 117/73, pulse 62, resp. rate 18, last menstrual period 02/27/2012.  PE deferred--see notes from office eval today.  ED Course  Known left ovarian cyst, with increased pain.  Plan: Dilaudid 4 mg po now. Pelvic US, R/O torsion.  Nigel Bridgeman, CNM, [place] 03/20/12 6:55p

## 2012-04-07 ENCOUNTER — Telehealth: Payer: Self-pay | Admitting: Obstetrics and Gynecology

## 2012-04-07 NOTE — Telephone Encounter (Signed)
Laura/SR pt. °

## 2012-04-07 NOTE — Telephone Encounter (Signed)
Pt notified that it will be ok to do u/s while on cycle.  Spoke with Olegario Messier to confirm.  Pt agreeable.  Msg to [facility] to make sure appts are still in the comp.  ld

## 2012-04-09 ENCOUNTER — Encounter: Payer: BC Managed Care – PPO | Admitting: Obstetrics and Gynecology

## 2012-05-05 ENCOUNTER — Encounter: Payer: BC Managed Care – PPO | Admitting: Obstetrics and Gynecology

## 2012-05-05 ENCOUNTER — Other Ambulatory Visit: Payer: BC Managed Care – PPO

## 2012-05-07 ENCOUNTER — Encounter: Payer: Self-pay | Admitting: Obstetrics and Gynecology

## 2012-06-07 ENCOUNTER — Emergency Department (HOSPITAL_COMMUNITY)
Admission: EM | Admit: 2012-06-07 | Discharge: 2012-06-07 | Disposition: A | Discharge status: Home / Self Care | Payer: BC Managed Care – PPO | Attending: Emergency Medicine | Admitting: Emergency Medicine

## 2012-06-07 ENCOUNTER — Encounter (HOSPITAL_COMMUNITY): Payer: Self-pay | Admitting: Emergency Medicine

## 2012-06-07 DIAGNOSIS — S0180XA Unspecified open wound of other part of head, initial encounter: Secondary | ICD-10-CM | POA: Insufficient documentation

## 2012-06-07 DIAGNOSIS — F17200 Nicotine dependence, unspecified, uncomplicated: Secondary | ICD-10-CM | POA: Insufficient documentation

## 2012-06-07 DIAGNOSIS — S0083XA Contusion of other part of head, initial encounter: Secondary | ICD-10-CM

## 2012-06-07 DIAGNOSIS — G35 Multiple sclerosis: Secondary | ICD-10-CM | POA: Insufficient documentation

## 2012-06-07 DIAGNOSIS — K219 Gastro-esophageal reflux disease without esophagitis: Secondary | ICD-10-CM | POA: Insufficient documentation

## 2012-06-07 DIAGNOSIS — H1130 Conjunctival hemorrhage, unspecified eye: Secondary | ICD-10-CM | POA: Insufficient documentation

## 2012-06-07 DIAGNOSIS — S025XXA Fracture of tooth (traumatic), initial encounter for closed fracture: Secondary | ICD-10-CM

## 2012-06-07 DIAGNOSIS — S0181XA Laceration without foreign body of other part of head, initial encounter: Secondary | ICD-10-CM

## 2012-06-07 DIAGNOSIS — Z9884 Bariatric surgery status: Secondary | ICD-10-CM | POA: Insufficient documentation

## 2012-06-07 MED ORDER — OXYCODONE-ACETAMINOPHEN 5-325 MG PO TABS: 1.0000 | ORAL_TABLET | ORAL | Status: AC | PRN

## 2012-06-07 MED ORDER — OXYCODONE-ACETAMINOPHEN 5-325 MG PO TABS
2.0000 | ORAL_TABLET | Freq: Once | ORAL | Status: AC
  Administered 2012-06-07: 2 via ORAL
  Filled 2012-06-07: qty 2

## 2012-06-07 MED ORDER — TETANUS-DIPHTH-ACELL PERTUSSIS 5-2.5-18.5 LF-MCG/0.5 IM SUSP
0.5000 mL | Freq: Once | INTRAMUSCULAR | Status: AC
  Administered 2012-06-07: 0.5 mL via INTRAMUSCULAR
  Filled 2012-06-07: qty 0.5

## 2012-06-07 MED ORDER — IBUPROFEN 200 MG PO TABS
400.0000 mg | ORAL_TABLET | Freq: Once | ORAL | Status: AC
Start: 2012-06-07 — End: 2012-06-07
  Administered 2012-06-07: 400 mg via ORAL
  Filled 2012-06-07: qty 2
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
## 2012-07-09 ENCOUNTER — Emergency Department (HOSPITAL_COMMUNITY): Payer: BC Managed Care – PPO

## 2012-07-09 ENCOUNTER — Encounter (HOSPITAL_COMMUNITY): Payer: Self-pay | Admitting: Emergency Medicine

## 2012-07-09 ENCOUNTER — Emergency Department (HOSPITAL_COMMUNITY)
Admission: EM | Admit: 2012-07-09 | Discharge: 2012-07-09 | Disposition: A | Discharge status: Home / Self Care | Payer: BC Managed Care – PPO | Attending: Emergency Medicine | Admitting: Emergency Medicine

## 2012-07-09 DIAGNOSIS — G35 Multiple sclerosis: Secondary | ICD-10-CM | POA: Insufficient documentation

## 2012-07-09 DIAGNOSIS — J45909 Unspecified asthma, uncomplicated: Secondary | ICD-10-CM | POA: Insufficient documentation

## 2012-07-09 DIAGNOSIS — H53149 Visual discomfort, unspecified: Secondary | ICD-10-CM | POA: Insufficient documentation

## 2012-07-09 DIAGNOSIS — R51 Headache: Secondary | ICD-10-CM | POA: Insufficient documentation

## 2012-07-09 DIAGNOSIS — K219 Gastro-esophageal reflux disease without esophagitis: Secondary | ICD-10-CM | POA: Insufficient documentation

## 2012-07-09 DIAGNOSIS — F17200 Nicotine dependence, unspecified, uncomplicated: Secondary | ICD-10-CM | POA: Insufficient documentation

## 2012-07-09 MED ORDER — ONDANSETRON 4 MG PO TBDP
8.0000 mg | ORAL_TABLET | Freq: Once | ORAL | Status: AC
  Administered 2012-07-09: 8 mg via ORAL
  Filled 2012-07-09: qty 2

## 2012-07-09 MED ORDER — HYDROMORPHONE HCL PF 2 MG/ML IJ SOLN
2.0000 mg | Freq: Once | INTRAMUSCULAR | Status: AC
  Administered 2012-07-09: 2 mg via INTRAMUSCULAR
  Filled 2012-07-09: qty 1

## 2012-07-09 MED ORDER — HYDROCODONE-ACETAMINOPHEN 5-325 MG PO TABS: 1.0000 | ORAL_TABLET | ORAL | Status: AC | PRN
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
## 2013-03-13 ENCOUNTER — Encounter (HOSPITAL_COMMUNITY): Payer: Self-pay | Admitting: Emergency Medicine

## 2013-03-13 ENCOUNTER — Emergency Department (HOSPITAL_COMMUNITY)
Admission: EM | Admit: 2013-03-13 | Discharge: 2013-03-13 | Disposition: A | Discharge status: Home / Self Care | Payer: BC Managed Care – PPO | Attending: Emergency Medicine | Admitting: Emergency Medicine

## 2013-03-13 ENCOUNTER — Emergency Department (HOSPITAL_COMMUNITY): Payer: BC Managed Care – PPO

## 2013-03-13 DIAGNOSIS — Z3202 Encounter for pregnancy test, result negative: Secondary | ICD-10-CM | POA: Insufficient documentation

## 2013-03-13 DIAGNOSIS — F17200 Nicotine dependence, unspecified, uncomplicated: Secondary | ICD-10-CM | POA: Insufficient documentation

## 2013-03-13 DIAGNOSIS — K219 Gastro-esophageal reflux disease without esophagitis: Secondary | ICD-10-CM | POA: Insufficient documentation

## 2013-03-13 DIAGNOSIS — R300 Dysuria: Secondary | ICD-10-CM | POA: Insufficient documentation

## 2013-03-13 DIAGNOSIS — Z9884 Bariatric surgery status: Secondary | ICD-10-CM | POA: Insufficient documentation

## 2013-03-13 DIAGNOSIS — R319 Hematuria, unspecified: Secondary | ICD-10-CM | POA: Insufficient documentation

## 2013-03-13 DIAGNOSIS — Z9189 Other specified personal risk factors, not elsewhere classified: Secondary | ICD-10-CM | POA: Insufficient documentation

## 2013-03-13 DIAGNOSIS — G35 Multiple sclerosis: Secondary | ICD-10-CM | POA: Insufficient documentation

## 2013-03-13 DIAGNOSIS — Z79899 Other long term (current) drug therapy: Secondary | ICD-10-CM | POA: Insufficient documentation

## 2013-03-13 DIAGNOSIS — N390 Urinary tract infection, site not specified: Secondary | ICD-10-CM | POA: Insufficient documentation

## 2013-03-13 DIAGNOSIS — R109 Unspecified abdominal pain: Secondary | ICD-10-CM

## 2013-03-13 DIAGNOSIS — R110 Nausea: Secondary | ICD-10-CM | POA: Insufficient documentation

## 2013-03-13 DIAGNOSIS — J45909 Unspecified asthma, uncomplicated: Secondary | ICD-10-CM | POA: Insufficient documentation

## 2013-03-13 LAB — CBC WITH DIFFERENTIAL/PLATELET
Basophils Absolute: 0 10*3/uL (ref 0.0–0.1)
HCT: 33.3 % — ABNORMAL LOW (ref 36.0–46.0)
Lymphocytes Relative: 13 % (ref 12–46)
Monocytes Relative: 9 % (ref 3–12)
Neutro Abs: 12.8 10*3/uL — ABNORMAL HIGH (ref 1.7–7.7)
Neutrophils Relative %: 78 % — ABNORMAL HIGH (ref 43–77)
RDW: 17.1 % — ABNORMAL HIGH (ref 11.5–15.5)
WBC: 16.4 10*3/uL — ABNORMAL HIGH (ref 4.0–10.5)

## 2013-03-13 LAB — COMPREHENSIVE METABOLIC PANEL
ALT: 14 U/L (ref 0–35)
AST: 18 U/L (ref 0–37)
Albumin: 4 g/dL (ref 3.5–5.2)
Alkaline Phosphatase: 95 U/L (ref 39–117)
Glucose, Bld: 102 mg/dL — ABNORMAL HIGH (ref 70–99)
Potassium: 4.1 mEq/L (ref 3.5–5.1)
Sodium: 136 mEq/L (ref 135–145)
Total Protein: 7.9 g/dL (ref 6.0–8.3)

## 2013-03-13 LAB — URINE MICROSCOPIC-ADD ON

## 2013-03-13 LAB — URINALYSIS, ROUTINE W REFLEX MICROSCOPIC
Glucose, UA: NEGATIVE mg/dL
Protein, ur: >300 mg/dL — AB
pH: 6 (ref 5.0–8.0)

## 2013-03-13 MED ORDER — OXYCODONE-ACETAMINOPHEN 5-325 MG PO TABS
1.0000 | ORAL_TABLET | Freq: Once | ORAL | Status: AC
  Administered 2013-03-13: 1 via ORAL
  Filled 2013-03-13: qty 1

## 2013-03-13 MED ORDER — HYDROMORPHONE HCL PF 1 MG/ML IJ SOLN
1.0000 mg | Freq: Once | INTRAMUSCULAR | Status: AC
  Administered 2013-03-13: 1 mg via INTRAVENOUS
  Filled 2013-03-13: qty 1

## 2013-03-13 MED ORDER — CEFTRIAXONE SODIUM 1 G IJ SOLR
1.0000 g | Freq: Once | INTRAMUSCULAR | Status: AC
  Administered 2013-03-13: 1 g via INTRAVENOUS
  Filled 2013-03-13: qty 10

## 2013-03-13 MED ORDER — FENTANYL CITRATE 0.05 MG/ML IJ SOLN: 50.0000 ug | Freq: Once | INTRAMUSCULAR | Status: DC

## 2013-03-13 MED ORDER — KETOROLAC TROMETHAMINE 30 MG/ML IJ SOLN
30.0000 mg | Freq: Once | INTRAMUSCULAR | Status: AC
  Administered 2013-03-13: 30 mg via INTRAVENOUS
  Filled 2013-03-13: qty 1

## 2013-03-13 MED ORDER — ONDANSETRON HCL 4 MG/2ML IJ SOLN
4.0000 mg | Freq: Once | INTRAMUSCULAR | Status: AC
  Administered 2013-03-13: 4 mg via INTRAVENOUS
  Filled 2013-03-13: qty 2

## 2013-03-13 MED ORDER — OXYCODONE-ACETAMINOPHEN 5-325 MG PO TABS: 1.0000 | ORAL_TABLET | ORAL | Status: DC | PRN

## 2013-03-13 MED ORDER — CIPROFLOXACIN HCL 500 MG PO TABS: 500.0000 mg | ORAL_TABLET | Freq: Two times a day (BID) | ORAL | Status: DC

## 2013-03-13 NOTE — ED Notes (Signed)
Pharmacy tech at bedside 

## 2013-03-13 NOTE — ED Notes (Signed)
Registration at bedside to d/c pt.

## 2013-03-13 NOTE — ED Notes (Signed)
Pt returned from radiology.

## 2013-03-13 NOTE — ED Notes (Signed)
Pt with lower flank and abd pain into back starting yesterday and much worse today; pt appears distressed from pain at present; pt sts hematuria

## 2013-03-13 NOTE — ED Provider Notes (Signed)
Medical screening examination/treatment/procedure(s) were conducted as a shared visit with non-physician practitioner(s) and myself.  I personally evaluated the patient during the encounter.  String visible suggestive of kidney stone, but urinalysis reveals infection.  Treat pain, hydrate and discharge home on antibiotics   Donnetta Hutching, MD 03/13/13 [phone]

## 2013-03-13 NOTE — ED Provider Notes (Signed)
History     CSN: [id number]  Arrival date & time 03/13/13  1401   First MD Initiated Contact with Patient 03/13/13 1440      Chief Complaint  Patient presents with  . Flank Pain  . Abdominal Pain    (Consider location/radiation/quality/duration/timing/severity/associated sxs/prior treatment) HPI Comments: Patient with PMH significant for MS, asthma, GERD, presents to the ED for constant, sharp, bilateral flank pain radiating to her groin associated with intermittent nausea. Pain started yesterday and has progressively gotten worse. Patient also endorses some dysuria with dark discoloration of her urine and hematuria intermittently.  Patient is status post gastric bypass.  BM normal, no diarrhea or hematochezia.  Pt has not urinated today but does not feel like she needs to go.  No hx of kidney stones.  Does not drink excessive amounts of coffee, tea, or soda- drinks mostly water and beer.  Denies any recent fever, sweats, or chills. PCP- Georgette Shell.  Patient is a 33 y.o. female presenting with flank pain and abdominal pain. The history is provided by the patient.  Flank Pain Associated symptoms include abdominal pain.  Abdominal Pain Associated symptoms: dysuria     Past Medical History  Diagnosis Date  . Acid reflux   . Asthma   . History of blood transfusion   . Multiple sclerosis 2005    Past Surgical History  Procedure Laterality Date  . Gastric bypass    . Abdominal surgery  2003    weight loss of over 200 lbs    Family History  Problem Relation Age of Onset  . Hypertension Paternal Grandfather   . Colon cancer Paternal Grandfather 71  . Asthma Paternal Grandmother   . Hypertension Paternal Grandmother   . Breast cancer Paternal Grandmother   . Cancer Paternal Grandmother     lung   . Diabetes Maternal Grandmother   . Diabetes Maternal Grandfather   . Hypertension Father   . Urinary tract infection Mother   . Stroke Mother 82    3 strokes     History   Substance Use Topics  . Smoking status: Current Some Day Smoker    Types: Cigarettes    Last Attempt to Quit: 05/14/2011  . Smokeless tobacco: Never Used  . Alcohol Use: Yes    OB History   Grav Para Term Preterm Abortions TAB SAB Ect Mult Living   0         0      Review of Systems  Gastrointestinal: Positive for abdominal pain.  Genitourinary: Positive for dysuria and flank pain.  All other systems reviewed and are negative.    Allergies  Review of patient's allergies indicates no known allergies.  Home Medications   Current Outpatient Rx  Name  Route  Sig  Dispense  Refill  . cephALEXin (KEFLEX) 500 MG capsule   Oral   Take 500 mg by mouth 3 (three) times daily.         . clonazePAM (KLONOPIN) 0.5 MG tablet   Oral   Take 0.5 mg by mouth 2 (two) times daily as needed. For anxiety.         Marland Kitchen LORazepam (ATIVAN) 0.5 MG tablet   Oral   Take 0.5 mg by mouth every 8 (eight) hours as needed. For anxiety.         Marland Kitchen oxyCODONE-acetaminophen (PERCOCET/ROXICET) 5-325 MG per tablet   Oral   Take 1 tablet by mouth every 4 (four) hours as needed. For pain.         Marland Kitchen  SUMAtriptan (IMITREX) 50 MG tablet   Oral   Take 50 mg by mouth every 2 (two) hours as needed. For migraines.           BP 144/104  Pulse 108  Temp(Src) 98.1 F (36.7 C) (Oral)  Resp 20  SpO2 99%  Physical Exam  Nursing note and vitals reviewed. Constitutional: She is oriented to person, place, and time. She appears well-developed and well-nourished. She appears distressed.  Writhing in bed, unable to find a comfortable position  HENT:  Head: Normocephalic and atraumatic.  Mouth/Throat: Oropharynx is clear and moist.  Eyes: Conjunctivae and EOM are normal. Pupils are equal, round, and reactive to light.  Neck: Normal range of motion.  Cardiovascular: Normal rate, regular rhythm and normal heart sounds.   Pulmonary/Chest: Effort normal and breath sounds normal.  Abdominal: Soft. Bowel  sounds are normal. There is no tenderness. There is CVA tenderness (R > L). There is no tenderness at McBurney's point and negative Murphy's sign.  Bilateral CVA tenderness, R > L, radiating to groin  Musculoskeletal: Normal range of motion.  Neurological: She is alert and oriented to person, place, and time.  Skin: Skin is warm and dry.  Psychiatric: She has a normal mood and affect.    ED Course  Procedures (including critical care time)  Labs Reviewed  URINALYSIS, ROUTINE W REFLEX MICROSCOPIC - Abnormal; Notable for the following:    Color, Urine RED (*)    APPearance TURBID (*)    Hgb urine dipstick LARGE (*)    Bilirubin Urine SMALL (*)    Ketones, ur 15 (*)    Protein, ur >300 (*)    Nitrite POSITIVE (*)    Leukocytes, UA MODERATE (*)    All other components within normal limits  CBC WITH DIFFERENTIAL - Abnormal; Notable for the following:    WBC 16.4 (*)    Hemoglobin 10.2 (*)    HCT 33.3 (*)    MCV 67.7 (*)    MCH 20.7 (*)    RDW 17.1 (*)    Platelets 434 (*)    Neutrophils Relative % 78 (*)    Neutro Abs 12.8 (*)    Monocytes Absolute 1.5 (*)    All other components within normal limits  COMPREHENSIVE METABOLIC PANEL - Abnormal; Notable for the following:    Glucose, Bld 102 (*)    All other components within normal limits  URINE MICROSCOPIC-ADD ON - Abnormal; Notable for the following:    Squamous Epithelial / LPF FEW (*)    Bacteria, UA MANY (*)    All other components within normal limits  URINE CULTURE  POCT PREGNANCY, URINE   Ct Abdomen Pelvis Wo Contrast  03/13/2013   *RADIOLOGY REPORT*  Clinical Data: Flank and abdominal pain.  Hematuria.  CT ABDOMEN AND PELVIS WITHOUT CONTRAST  Technique:  Multidetector CT imaging of the abdomen and pelvis was performed following the standard protocol without intravenous contrast.  Comparison: 03/21/2011  Findings: Lung bases are clear.  No pleural or pericardial fluid. The liver has a normal appearance without contrast.   No calcified gallstones.  The spleen is unremarkable.  There are changes of previous bariatric surgery.  Pancreas is normal.  The adrenal glands are normal.  Both kidneys are normal in size shape and position.  The left kidney appears entirely normal.  No cyst, mass, stone or hydronephrosis.  There is mild stranding of the fat around the right kidney and mild fullness of the renal collecting system  and ureter.  A small amount edema tracts along the course of the right ureter.  I do not see a stone in the ureter or in the bladder.  It is possible that a stone has passed.  The other possibilities include pyelonephritis.  The aorta shows mild atherosclerotic change but no aneurysm.  The IVC is unremarkable.  Uterus and adnexal regions appear normal. There is some sort of hyperdense material posterior to the cervix, unchanged and not of acute significance.  There is mild facet degeneration in the lower lumbar spine but no other notable bony finding.  IMPRESSION: No visible urinary tract stone disease.  There is mild fullness of the right renal collecting system and ureter and there is a small amount of edema in the region of the right kidney and along the course of the right ureter.  This raises the possibility of a stone has recently passed.  The differential diagnosis would include pyelonephritis.   Original Report Authenticated By: Paulina Fusi, M.D.     1. UTI (lower urinary tract infection)   2. Flank pain       MDM   31 y.o. F presenting to the ED for bilateral flank pain, R > L, with associated with dysuria and urine discoloration.  No hx of kidney stones.  U/a nitrite +, culture pending.  CT abd pelvis without evidence of urinary calculi but possible recently passed stone vs pyelo.  Rocephin 1g IV given in the ED.  Sx well controlled with IVF, dilaudid, toradol, and zofran.  Pt afebrile, non-toxic appearing, NAD, VS stable- ok for d/c.  Rx cipro and percocet.  FU with PCP if sx worsen.  Discussed  plan with pt, she agreed.  Return precautions advised.        Garlon Hatchet, PA-C 03/13/13 Paulo Fruit

## 2013-03-13 NOTE — ED Notes (Signed)
Pt reports a few days ago she noticed her urine was darker than normal, then noticed some bld in her urine and at first thought it was period bld but then started to experience abd and flank pain. Pt reports the pain is unbearable so she came to ED. Pt in nad, skin warm and dry, resp e/u.

## 2013-03-15 LAB — URINE CULTURE

## 2013-03-16 NOTE — ED Notes (Signed)
Post ED Visit - Positive Culture Follow-up  Culture report reviewed by antimicrobial stewardship pharmacist: []  Wes Dulaney, Pharm.D., BCPS []  Celedonio Miyamoto, Pharm.D., BCPS []  Georgina Pillion, [address].D., BCPS [x]  [place], [place].D., BCPS, AAHIVP []  Estella Husk, Pharm.D., BCPS, AAHIV  Positive urine-culture Treated with Cipro, organism sensitive to the same and no further patient follow-up is required at this time.  Larena Sox 03/16/2013, 6:15 PM

## 2013-07-22 ENCOUNTER — Encounter: Payer: Self-pay | Admitting: Medical

## 2013-07-22 ENCOUNTER — Ambulatory Visit (INDEPENDENT_AMBULATORY_CARE_PROVIDER_SITE_OTHER): Payer: BC Managed Care – PPO | Admitting: Medical

## 2013-07-22 VITALS — BP 110/80 | HR 78 | Temp 97.9°F | Resp 16 | Wt 245.0 lb

## 2013-07-22 DIAGNOSIS — G4700 Insomnia, unspecified: Secondary | ICD-10-CM

## 2013-07-22 DIAGNOSIS — F17200 Nicotine dependence, unspecified, uncomplicated: Secondary | ICD-10-CM

## 2013-07-22 DIAGNOSIS — G35 Multiple sclerosis: Secondary | ICD-10-CM

## 2013-07-22 DIAGNOSIS — F410 Panic disorder [episodic paroxysmal anxiety] without agoraphobia: Secondary | ICD-10-CM

## 2013-07-22 DIAGNOSIS — J329 Chronic sinusitis, unspecified: Secondary | ICD-10-CM

## 2013-07-22 DIAGNOSIS — G8929 Other chronic pain: Secondary | ICD-10-CM

## 2013-07-22 DIAGNOSIS — K259 Gastric ulcer, unspecified as acute or chronic, without hemorrhage or perforation: Secondary | ICD-10-CM

## 2013-07-22 MED ORDER — AMOXICILLIN 875 MG PO TABS: 875.0000 mg | ORAL_TABLET | Freq: Two times a day (BID) | ORAL | Status: DC

## 2013-07-22 MED ORDER — DULOXETINE HCL 60 MG PO CPEP: 60.0000 mg | ORAL_CAPSULE | Freq: Every day | ORAL | Status: DC

## 2013-07-22 MED ORDER — OMEPRAZOLE 40 MG PO CPDR: 40.0000 mg | DELAYED_RELEASE_CAPSULE | Freq: Every day | ORAL | Status: DC

## 2013-07-22 MED ORDER — CLONAZEPAM 0.5 MG PO TABS: 0.5000 mg | ORAL_TABLET | Freq: Two times a day (BID) | ORAL | Status: DC | PRN

## 2013-07-22 NOTE — Patient Instructions (Signed)
Sinusitis Sinuses are air pockets within the bones of your face. The growth of bacteria within a sinus leads to infection. Infection keeps the sinuses from draining. This infection is called sinusitis. SYMPTOMS There will be different areas of pain depending on which sinuses have become infected.  The maxillary sinuses often produce pain beneath the eyes.   Frontal sinusitis may cause pain in the middle of the forehead and above the eyes.  Other problems (symptoms) include:  Toothaches.   Colored, pus-like (purulent) drainage from the nose.   Any swelling, warmth, or tenderness over the sinus areas may be signs of infection.  TREATMENT Sinusitis is most often determined by an exam and you may have x-rays taken. If x-rays have been taken, make sure you obtain your results. Or find out how you are to obtain them. Your caregiver may give you medications (antibiotics). These are medications that will help kill the infection. You may also be given a medication (decongestant) that helps to reduce sinus swelling.  HOME CARE INSTRUCTIONS  Only take over-the-counter or prescription medicines for pain, discomfort, or fever as directed by your caregiver.   Drink extra fluids. Fluids help thin the mucus so your sinuses can drain more easily.   Applying either moist heat or ice packs to the sinus areas may help relieve discomfort.   Use saline nasal sprays to help moisten your sinuses. The sprays can be found at your local drugstore.  SEEK IMMEDIATE MEDICAL CARE IF YOU DEVELOP:  High fever that is still present after two days of antibiotic treatment.   Increasing pain, severe headaches, or toothache.   Nausea, vomiting, or drowsiness.   Unusual swelling around the face or trouble seeing.  MAKE SURE YOU:   Understand these instructions.   Will watch your condition.   Will get help right away if you are not doing well or get worse.  Document Released: 10/15/2005 Document Re-Released:  09/27/2008 [facility] Patient Information 2011 [place], [place].    Anxiety and Panic Attacks Your caregiver has informed you that you are having an anxiety or panic attack. There may be many forms of this. Most of the time these attacks come suddenly and without warning. They come at any time of day, including periods of sleep, and at any time of life. They may be strong and unexplained. Although panic attacks are very scary, they are physically harmless. Sometimes the cause of your anxiety is not known. Anxiety is a protective mechanism of the body in its fight or flight mechanism. Most of these perceived danger situations are actually nonphysical situations (such as anxiety over losing a job). CAUSES  The causes of an anxiety or panic attack are many. Panic attacks may occur in otherwise healthy people given a certain set of circumstances. There may be a genetic cause for panic attacks. Some medications may also have anxiety as a side effect. SYMPTOMS  Some of the most common feelings are:  Intense terror.   Dizziness, feeling faint.   Hot and cold flashes.   Fear of going crazy.   Feelings that nothing is real.   Sweating.   Shaking.   Chest pain or a fast heartbeat (palpitations).   Smothering, choking sensations.   Feelings of impending doom and that death is near.   Tingling of extremities, this may be from over-breathing.   Altered reality (derealization).   Being detached from yourself (depersonalization).  Several symptoms can be present to make up anxiety or panic attacks. DIAGNOSIS  The evaluation  by your caregiver will depend on the type of symptoms you are experiencing. The diagnosis of anxiety or panic attack is made when no physical illness can be determined to be a cause of the symptoms. TREATMENT  Treatment to prevent anxiety and panic attacks may include:  Avoidance of circumstances that cause anxiety.   Reassurance and relaxation.   Regular exercise.     Relaxation therapies, such as yoga.   Psychotherapy with a psychiatrist or therapist.   Avoidance of caffeine, alcohol and illegal drugs.   Prescribed medication.  SEEK IMMEDIATE MEDICAL CARE IF:   You experience panic attack symptoms that are different than your usual symptoms.   You have any worsening or concerning symptoms.  Document Released: 10/15/2005 Document Revised: 06/27/2011 Document Reviewed: 02/16/2010 [facility] Patient Information 2012 [place], [place].    RESOURCES in [place], [place]  If you are experiencing a mental health crisis or an emergency, please call 911 or go to the nearest emergency department.  [facility]   [phone] [facility]  [phone] [facility]   [phone]  Suicide Hotline 1-800-Suicide 5790730768)  National Suicide Prevention Lifeline (239)243-9625  6092664624)  Domestic Violence, Rape/Crisis - [facility] [place] 742-595-6387  [employer] Violence Hotline 1-800-799-SAFE 934-242-1398)  To report Child or Elder Abuse, please call: [facility] Police Department  [phone] [facility] Department  [phone]  [facility] Crisis Line [phone]  Teen Crisis line 514-284-3103 or [phone]     Psychiatry and Counseling services   Dr. Andee Poles, psychiatry [phone] office [email or web address] [address], [address] [place], [place], [place] [address] Dr. Andee Poles Valinda Hoar, NP Grayland Ormond, NP  Anxiety, Depression, ADHD, OCD, Eating Disorders, Bipolar, other   [facility] [phone] office [email or web address] [address] [place], [place] [address]  Dr. Lynden Ang, psychiatry services  Dr. Bennie Dallas  Depression, Anxiety, Substance Abuse, Couples Issues, Adolescent Issues Oneta Rack, NP Depression, Anxiety, ADHD, Women's issues, Bipolar Disorder, Substance   Abuse Saul Fordyce, NP Depression, Anxiety, Aging, ADHD, Bipolar Disorder, Substance Abuse Manuela Neptune, NP  Mood disturbances, ADHD, children, adolescents, adults Geronimo Running, Therapist Sexual Addiction, Bipolar, Depression, Anxiety, Substance Addiction Shaaron Adler, Therapist Grief and Loss, Anxiety, Depression, Bipolar, Medical Challenges, Life    Transitions Michaelle Copas, Therapist Substance Abuse, Relationships, Clergy Families, Anger and Stress Management, Postpartum Depression, Pre-Marital Counseling Rochele Raring, Therapist [place], Anxiety, Depression, ADHD, Adjustment Disorder, PTSD, Grief and Loss, Divorce, Adoption Concerns   [facility] [phone] office [email or web address] [address] [place], [place], [place] [address]  Franchot Erichsen, MA, clinical psychologist  Cognitive-Behavior Therapy; Mood Disorders; Anxiety Disorders; adult and child ADHD; Family Therapy; Stress Management; personal growth, and Marital Therapy.    Carlus Pavlov Ph.D., clinical psychologist Cognitive-Behavior Therapy; Mood Disorders; Anxiety Disorders; Stress     Management   Miguel Aschoff Ph.D., clinical psychologist [phone] office [address] [place], [place] [address] Cognitive Behavior Therapy, Depression, Bipolar, Anxiety, Grief and Loss    [facility] [phone] office [address], [place], [place] [address] Crisis services, Family support, in home therapy, treatment for Anxiety, PTSD, Sexual Assault, Substance Abuse, Financial/Credit Counseling, Variety of other services    [email or web address] [phone] office [address], [place], [place] [address]  Veneda Melter, Ph.D., [facility] Family, Couples, Anxiety, Depression, ADHD, Abuse, Anger Management Sherie Don, M.Ed., LPC Couples, Sexual orientation, Domestic  violence, Child Abuse, Major Life Change,  Depression  Leandra Kern, [facility] Marriage counseling, Women's Issues, Depression, Intimacy, Career Issues Madelaine Etienne, Ph.D.,  [facility] PTSD, Addictions, Grief, Anxiety, Sexual Orientation Reather Laurence, [facility] Teen and child depression, anxiety, parenting challenges, Adult depression, self injury, relationship issues. Maple Hudson, [facility] Addiction, PTSD, Eating Disorders, Depression, Sexual Orientation Daun Peacock, [facility] Eating disorder, Anxiety and Depression, Grief, Divorce, Couples and Family Counseling, Parenting   Dr. Archer Asa, psychiatry [phone] office [address] [place], [place] [address]  Geriatric psychiatry services   [facility] [phone] office, 24x7 help line [email or web address] [address] [place], [place] [address] Substance Abuse, Depression, Anxiety, Mood Disorders, other Addictions, DWI Assessment/Treatment, Teen Issues, ADHD, Family Therapy Dr. Ezzard Flax, Psychiatry services   Posey Rea, Therapist Initial assessments, Clinical Director, Substance Abuse counseling, DWI and DMV assessments, individual and group counseling Arrie Senate, Therapist Depression, Anxiety, Dysfunctional families, Individual and Couples Counseling, Addiction, Sexual Abuse, Childhood Trauma, Spiritually Based Counseling Robin Ringer, Therapist [facility], Children and Adult Individual Counseling, Depression, Anxiety, Mood Disorders Danice Goltz, Therapist Ages 5 and up, individual, couple and family therapy, family concerns, ADHD, Mood disorders, Grief, Substance Abuse Weston Settle, Therapist Female patients only - Mood disorders, Depression, Anxiety, PTSD, Gried,   Abuse, Relationships   Dr. Milagros Evener, psychiatry [phone] office [address], [place], [place] [address]

## 2013-07-22 NOTE — Progress Notes (Signed)
Subjective:  Beth [name] is a 32 y.o. female who presents for possible sinus infection.  She is only scheduled as a 15 minute new patient acute visit, but apparently has a list of problems. She seems somewhat tearful when I entered the room. She was formerly seeing Eileen Stanford PA in [facility].  She reports one-week history of a worsening cold, worsening sinus pressure, sinus drainage, ear pressure, fatigue, sore throat, chills, flushing at times, nausea and vomiting a few times the last 3 days, using NyQuil.  Denies sick contacts. No other aggravating or relieving factors.   She states that she needs refill on her medications today for clonazepam and omeprazole.  She reports a history of prior gastric bypass but says that she has numerous ulcers in her stomach, takes omeprazole for this and Zantac over-the-counter without a lot of relief. She also notes a history of an ulcer that "exploded" in 2004 after being put on steroids for her multiple sclerosis and this almost killed her. She has never seen gastroenterology or had an EGD. She also reports a history of chronic nausea vomiting and diarrhea.  She notes a history of anxiety dating back to even teenage years, was on benzodiazepines even as a teenager. For the last several years had been off of medication and had done well, but her sister committed suicide in 2013 and she has had recurrent panic attacks and anxiety ever since.  She has never had counseling prior medications have included Paxil, Seroquel, Zoloft, and Ambien and Lunesta for sleep. Prior antidepressants made her feel more depressed.  She notes a history of multiple sclerosis diagnosed in 2004 in [place], was briefly put on steroids, but is never had any other treatment, and currently has not seen a neurologist since 2005.  ROS as in subjective  Allergies  Allergen Reactions  . Prednisone     Steroids in general    Current Outpatient Prescriptions on File Prior to  Visit  Medication Sig Dispense Refill  . oxyCODONE-acetaminophen (PERCOCET/ROXICET) 5-325 MG per tablet Take 1 tablet by mouth every 4 (four) hours as needed. For pain.      . promethazine (PHENERGAN) 25 MG tablet Take 25 mg by mouth every 6 (six) hours as needed for nausea.       No current facility-administered medications on file prior to visit.    Past Medical History  Diagnosis Date  . Acid reflux   . Asthma   . History of blood transfusion   . Multiple sclerosis 2005    diagnosed in [place], failed steroids, no other medication  . Anxiety   . Gastric ulcer   . Migraine   . Chronic nausea     self reported  . Chronic diarrhea     self reported  . Chronic pain     self reported chronic pain, back pain, fibromyalgia  . Insomnia     Past Surgical History  Procedure Laterality Date  . Gastric bypass    . Abdominal surgery  2003    weight loss of over 200 lbs    Family History  Problem Relation Age of Onset  . Hypertension Paternal Grandfather   . Colon cancer Paternal Grandfather 31  . Asthma Paternal Grandmother   . Hypertension Paternal Grandmother   . Breast cancer Paternal Grandmother   . Cancer Paternal Grandmother     lung   . Diabetes Maternal Grandmother   . Diabetes Maternal Grandfather   . Hypertension Father   .  Urinary tract infection Mother   . Stroke Mother 53    3 strokes     History   Social History  . Marital Status: Married    Spouse Name: N/A    Number of Children: N/A  . Years of Education: N/A   Occupational History  . Not on file.   Social History Main Topics  . Smoking status: Current Some Day Smoker    Types: Cigarettes    Last Attempt to Quit: 05/14/2011  . Smokeless tobacco: Never Used  . Alcohol Use: 1.2 oz/week    1 Cans of beer, 1 Shots of liquor per week  . Drug Use: No  . Sexual Activity: Yes    Birth Control/ Protection: None   Other Topics Concern  . Not on file   Social History Narrative  . No narrative on  file    Reviewed their medical, surgical, family, social, medication, and allergy history and updated chart as appropriate.    Objective: Filed Vitals:   07/22/13 1108  BP: 110/80  Pulse: 78  Temp: 97.9 F (36.6 C)  Resp: 16    General appearance: Alert, WD/WN, no distress, obese white female                             Skin: warm, no rash                           Head: + frontal sinus tenderness,                            Eyes: conjunctiva normal, corneas clear, PERRLA                            Ears: pearly TMs, external ear canals normal                          Nose: septum midline, turbinates swollen, with erythema and clear discharge             Mouth/throat: MMM, tongue normal, mild pharyngeal erythema                           Neck: supple, no adenopathy, no thyromegaly, nontender                          Heart: RRR, normal S1, S2, no murmurs                         Lungs: CTA bilaterally, no wheezes, rales, or rhonchi   Psych: pleasant, tearful at times, answers questions appropriately    Assessment and Plan:   Encounter Diagnoses  Name Primary?  . Sinusitis Yes  . Panic attacks   . Insomnia   . Gastric ulcer   . Multiple sclerosis   . Tobacco use disorder   . Chronic pain    Sinusitis -  prescription given for Amoxicillin.  Can use OTC Mucinex for congestion.  Tylenol or Ibuprofen OTC for fever and malaise.  Discussed symptomatic relief, nasal saline flush, and call or return if worse or not improving in 2-3 days.    Panic attacks - we discussed her concerns, symptoms, prior medications. I discussed  my concerns for use of benzos given their addictive potential.  I strongly recommended she seek counseling.  I gave a list of counselors.  She will begin Cymbalta. Refilled her clonazepam for when necessary use.  Insomnia - status sleep hygiene, getting her anxiety under better control, recheck in one month  Gastric ulcer, chronic nausea and diarrhea - increased  omeprazole to 40 mg daily, advised to take on empty stomach first thing in the morning which she has not been doing.  Interestingly, she notes history of gastric bypass and numerous ulcers in her stomach although she has never had EGD.  I advised if not improved in 2 weeks, then we should pursue GI consult  Multiple sclerosis- will need to obtain prior records, will likely need referral to neurology  Tobacco use disorder  Chronic pain-of note her chart history shows prior oxycodone use which she says was prn from the emergency department.  She does note frequent emergency department visits for pain and other issues. I advised that we will need to see her back to further discuss pain and her other issues as that is beyond what we can accomplish today.  I also advised that we would not be refilling narcotic pain medications.

## 2013-07-23 ENCOUNTER — Telehealth: Payer: Self-pay | Admitting: Medical

## 2013-07-23 NOTE — Telephone Encounter (Signed)
I'm going to let you handle this

## 2013-07-23 NOTE — Telephone Encounter (Signed)
lm

## 2013-07-24 NOTE — Telephone Encounter (Signed)
Pt was advised to stop the cymbalta although she has only took one dose.  She is currently being treated for sinus infection too.  She will use the medication to help with the sinus infection, and then will f/u with phone call in 1wk.   Will possibly restart cymbalta once she is over the infection.

## 2013-08-05 ENCOUNTER — Ambulatory Visit: Payer: BC Managed Care – PPO | Admitting: Medical

## 2014-04-13 ENCOUNTER — Inpatient Hospital Stay (HOSPITAL_COMMUNITY)
Admission: EM | Admit: 2014-04-13 | Discharge: 2014-04-16 | DRG: 519 | Disposition: A | Discharge status: Home / Self Care | Payer: Self-pay | Attending: Neurosurgery | Admitting: Neurosurgery

## 2014-04-13 ENCOUNTER — Encounter (HOSPITAL_COMMUNITY): Payer: Self-pay | Admitting: Emergency Medicine

## 2014-04-13 ENCOUNTER — Emergency Department (HOSPITAL_COMMUNITY): Payer: Self-pay

## 2014-04-13 DIAGNOSIS — M5126 Other intervertebral disc displacement, lumbar region: Principal | ICD-10-CM | POA: Diagnosis present

## 2014-04-13 DIAGNOSIS — Z9884 Bariatric surgery status: Secondary | ICD-10-CM

## 2014-04-13 DIAGNOSIS — F17200 Nicotine dependence, unspecified, uncomplicated: Secondary | ICD-10-CM | POA: Diagnosis present

## 2014-04-13 DIAGNOSIS — Z6841 Body Mass Index (BMI) 40.0 and over, adult: Secondary | ICD-10-CM

## 2014-04-13 DIAGNOSIS — G35 Multiple sclerosis: Secondary | ICD-10-CM | POA: Diagnosis present

## 2014-04-13 DIAGNOSIS — K219 Gastro-esophageal reflux disease without esophagitis: Secondary | ICD-10-CM | POA: Diagnosis present

## 2014-04-13 DIAGNOSIS — M5137 Other intervertebral disc degeneration, lumbosacral region: Secondary | ICD-10-CM | POA: Diagnosis present

## 2014-04-13 DIAGNOSIS — Z79899 Other long term (current) drug therapy: Secondary | ICD-10-CM

## 2014-04-13 DIAGNOSIS — IMO0002 Reserved for concepts with insufficient information to code with codable children: Secondary | ICD-10-CM

## 2014-04-13 DIAGNOSIS — F411 Generalized anxiety disorder: Secondary | ICD-10-CM | POA: Diagnosis present

## 2014-04-13 DIAGNOSIS — G8929 Other chronic pain: Secondary | ICD-10-CM | POA: Diagnosis present

## 2014-04-13 DIAGNOSIS — G4700 Insomnia, unspecified: Secondary | ICD-10-CM | POA: Diagnosis present

## 2014-04-13 DIAGNOSIS — M51379 Other intervertebral disc degeneration, lumbosacral region without mention of lumbar back pain or lower extremity pain: Secondary | ICD-10-CM | POA: Diagnosis present

## 2014-04-13 LAB — CBC WITH DIFFERENTIAL/PLATELET
BASOS ABS: 0.1 10*3/uL (ref 0.0–0.1)
Basophils Relative: 1 % (ref 0–1)
EOS ABS: 0.1 10*3/uL (ref 0.0–0.7)
Eosinophils Relative: 2 % (ref 0–5)
HCT: 32.3 % — ABNORMAL LOW (ref 36.0–46.0)
Hemoglobin: 9.5 g/dL — ABNORMAL LOW (ref 12.0–15.0)
LYMPHS PCT: 24 % (ref 12–46)
Lymphs Abs: 1.7 10*3/uL (ref 0.7–4.0)
MCH: 21.3 pg — AB (ref 26.0–34.0)
MCHC: 29.4 g/dL — AB (ref 30.0–36.0)
MCV: 72.3 fL — ABNORMAL LOW (ref 78.0–100.0)
MONO ABS: 0.8 10*3/uL (ref 0.1–1.0)
Monocytes Relative: 11 % (ref 3–12)
NEUTROS PCT: 62 % (ref 43–77)
Neutro Abs: 4.4 10*3/uL (ref 1.7–7.7)
Platelets: 534 10*3/uL — ABNORMAL HIGH (ref 150–400)
RBC: 4.47 MIL/uL (ref 3.87–5.11)
RDW: 19.2 % — AB (ref 11.5–15.5)
WBC: 7.1 10*3/uL (ref 4.0–10.5)

## 2014-04-13 LAB — BASIC METABOLIC PANEL
BUN: 4 mg/dL — AB (ref 6–23)
CALCIUM: 9 mg/dL (ref 8.4–10.5)
CO2: 23 mEq/L (ref 19–32)
Chloride: 101 mEq/L (ref 96–112)
Creatinine, Ser: 0.61 mg/dL (ref 0.50–1.10)
GLUCOSE: 87 mg/dL (ref 70–99)
POTASSIUM: 4.6 meq/L (ref 3.7–5.3)
SODIUM: 139 meq/L (ref 137–147)

## 2014-04-13 LAB — URINALYSIS, ROUTINE W REFLEX MICROSCOPIC
Bilirubin Urine: NEGATIVE
Glucose, UA: NEGATIVE mg/dL
Hgb urine dipstick: NEGATIVE
KETONES UR: NEGATIVE mg/dL
LEUKOCYTES UA: NEGATIVE
NITRITE: NEGATIVE
PROTEIN: NEGATIVE mg/dL
Specific Gravity, Urine: 1.013 (ref 1.005–1.030)
UROBILINOGEN UA: 0.2 mg/dL (ref 0.0–1.0)
pH: 8 (ref 5.0–8.0)

## 2014-04-13 LAB — PREGNANCY, URINE: PREG TEST UR: NEGATIVE

## 2014-04-13 MED ORDER — ONDANSETRON HCL 4 MG/2ML IJ SOLN
4.0000 mg | INTRAMUSCULAR | Status: DC | PRN
Start: 2014-04-13 — End: 2014-04-16
  Administered 2014-04-14 – 2014-04-15 (×2): 4 mg via INTRAVENOUS
  Filled 2014-04-13 (×2): qty 2

## 2014-04-13 MED ORDER — HYDROCODONE-ACETAMINOPHEN 5-325 MG PO TABS
1.0000 | ORAL_TABLET | ORAL | Status: DC | PRN
  Administered 2014-04-14 – 2014-04-15 (×3): 2 via ORAL
  Filled 2014-04-13 (×4): qty 2

## 2014-04-13 MED ORDER — DEXAMETHASONE 4 MG PO TABS
4.0000 mg | ORAL_TABLET | Freq: Four times a day (QID) | ORAL | Status: DC
  Administered 2014-04-14: 4 mg via ORAL
  Filled 2014-04-13 (×6): qty 1

## 2014-04-13 MED ORDER — SODIUM CHLORIDE 0.9 % IV SOLN: 250.0000 mL | INTRAVENOUS | Status: DC

## 2014-04-13 MED ORDER — HYDROMORPHONE HCL PF 1 MG/ML IJ SOLN
1.0000 mg | INTRAMUSCULAR | Status: AC
  Administered 2014-04-13: 1 mg via INTRAVENOUS
  Filled 2014-04-13: qty 1

## 2014-04-13 MED ORDER — LORAZEPAM 2 MG/ML IJ SOLN: 1.0000 mg | Freq: Once | INTRAMUSCULAR | Status: DC

## 2014-04-13 MED ORDER — ALUM & MAG HYDROXIDE-SIMETH 200-200-20 MG/5ML PO SUSP
30.0000 mL | Freq: Four times a day (QID) | ORAL | Status: DC | PRN
  Administered 2014-04-15: 30 mL via ORAL
  Filled 2014-04-13: qty 30

## 2014-04-13 MED ORDER — ACETAMINOPHEN 650 MG RE SUPP: 650.0000 mg | RECTAL | Status: DC | PRN

## 2014-04-13 MED ORDER — DIAZEPAM 5 MG PO TABS
5.0000 mg | ORAL_TABLET | Freq: Four times a day (QID) | ORAL | Status: DC | PRN
  Administered 2014-04-14 – 2014-04-15 (×4): 5 mg via ORAL
  Filled 2014-04-13 (×4): qty 1

## 2014-04-13 MED ORDER — SODIUM CHLORIDE 0.9 % IJ SOLN
3.0000 mL | Freq: Two times a day (BID) | INTRAMUSCULAR | Status: DC
  Administered 2014-04-13 – 2014-04-14 (×2): 3 mL via INTRAVENOUS

## 2014-04-13 MED ORDER — ZOLPIDEM TARTRATE 5 MG PO TABS: 5.0000 mg | ORAL_TABLET | Freq: Every evening | ORAL | Status: DC | PRN

## 2014-04-13 MED ORDER — LORAZEPAM 2 MG/ML IJ SOLN
1.0000 mg | Freq: Once | INTRAMUSCULAR | Status: AC
  Administered 2014-04-13: 1 mg via INTRAVENOUS
  Filled 2014-04-13: qty 1

## 2014-04-13 MED ORDER — SODIUM CHLORIDE 0.9 % IJ SOLN: 3.0000 mL | INTRAMUSCULAR | Status: DC | PRN

## 2014-04-13 MED ORDER — SENNA 8.6 MG PO TABS
1.0000 | ORAL_TABLET | Freq: Two times a day (BID) | ORAL | Status: DC
  Administered 2014-04-14 – 2014-04-15 (×3): 8.6 mg via ORAL
  Filled 2014-04-13 (×6): qty 1

## 2014-04-13 MED ORDER — PHENOL 1.4 % MT LIQD: 1.0000 | OROMUCOSAL | Status: DC | PRN

## 2014-04-13 MED ORDER — MENTHOL 3 MG MT LOZG: 1.0000 | LOZENGE | OROMUCOSAL | Status: DC | PRN

## 2014-04-13 MED ORDER — DEXAMETHASONE SODIUM PHOSPHATE 4 MG/ML IJ SOLN
4.0000 mg | Freq: Four times a day (QID) | INTRAMUSCULAR | Status: DC
  Administered 2014-04-14 (×2): 4 mg via INTRAVENOUS
  Filled 2014-04-13 (×6): qty 1

## 2014-04-13 MED ORDER — DOCUSATE SODIUM 100 MG PO CAPS
100.0000 mg | ORAL_CAPSULE | Freq: Two times a day (BID) | ORAL | Status: DC
  Administered 2014-04-13 – 2014-04-16 (×5): 100 mg via ORAL
  Filled 2014-04-13 (×5): qty 1

## 2014-04-13 MED ORDER — PANTOPRAZOLE SODIUM 40 MG IV SOLR
40.0000 mg | Freq: Every day | INTRAVENOUS | Status: DC
  Administered 2014-04-13 – 2014-04-14 (×2): 40 mg via INTRAVENOUS
  Filled 2014-04-13 (×4): qty 40

## 2014-04-13 MED ORDER — SENNOSIDES-DOCUSATE SODIUM 8.6-50 MG PO TABS
1.0000 | ORAL_TABLET | Freq: Every evening | ORAL | Status: DC | PRN
Start: 2014-04-13 — End: 2014-04-16

## 2014-04-13 MED ORDER — GADOBENATE DIMEGLUMINE 529 MG/ML IV SOLN
20.0000 mL | Freq: Once | INTRAVENOUS | Status: AC
  Administered 2014-04-13: 20 mL via INTRAVENOUS

## 2014-04-13 MED ORDER — HYDROMORPHONE HCL PF 1 MG/ML IJ SOLN
2.0000 mg | Freq: Once | INTRAMUSCULAR | Status: AC
  Administered 2014-04-13: 2 mg via INTRAMUSCULAR
  Filled 2014-04-13: qty 2

## 2014-04-13 MED ORDER — ACETAMINOPHEN 325 MG PO TABS: 650.0000 mg | ORAL_TABLET | ORAL | Status: DC | PRN

## 2014-04-13 MED ORDER — MORPHINE SULFATE 2 MG/ML IJ SOLN
1.0000 mg | INTRAMUSCULAR | Status: DC | PRN
  Administered 2014-04-13 – 2014-04-14 (×2): 2 mg via INTRAVENOUS
  Administered 2014-04-14 (×2): 4 mg via INTRAVENOUS
  Administered 2014-04-14 (×2): 2 mg via INTRAVENOUS
  Administered 2014-04-14: 4 mg via INTRAVENOUS
  Administered 2014-04-15 (×4): 2 mg via INTRAVENOUS
  Administered 2014-04-15 – 2014-04-16 (×2): 4 mg via INTRAVENOUS
  Administered 2014-04-16: 2 mg via INTRAVENOUS
  Filled 2014-04-13 (×3): qty 2
  Filled 2014-04-13 (×5): qty 1
  Filled 2014-04-13: qty 2
  Filled 2014-04-13 (×4): qty 1

## 2014-04-13 MED ORDER — KCL IN DEXTROSE-NACL 20-5-0.45 MEQ/L-%-% IV SOLN
INTRAVENOUS | Status: DC
  Administered 2014-04-13 – 2014-04-15 (×3): via INTRAVENOUS
  Filled 2014-04-13 (×6): qty 1000

## 2014-04-13 MED ORDER — OXYCODONE-ACETAMINOPHEN 5-325 MG PO TABS
1.0000 | ORAL_TABLET | ORAL | Status: DC | PRN
  Administered 2014-04-13 – 2014-04-15 (×5): 2 via ORAL
  Administered 2014-04-15: 1 via ORAL
  Administered 2014-04-16 (×3): 2 via ORAL
  Filled 2014-04-13 (×10): qty 2

## 2014-04-13 MED ORDER — PNEUMOCOCCAL VAC POLYVALENT 25 MCG/0.5ML IJ INJ
0.5000 mL | INJECTION | INTRAMUSCULAR | Status: AC
  Administered 2014-04-14: 0.5 mL via INTRAMUSCULAR
  Filled 2014-04-13 (×2): qty 0.5

## 2014-04-13 NOTE — ED Notes (Signed)
Pt given turkey sandwich and gingerale. 

## 2014-04-13 NOTE — ED Notes (Signed)
Dr. Venetia Maxon in to assess pt at this time.  Family remains at bedside.

## 2014-04-13 NOTE — ED Notes (Signed)
Per pt sts for about 4 weeks she has had worsening pain in RLE with numbness. sts also for the past week she has been having N,V,D.

## 2014-04-13 NOTE — ED Provider Notes (Signed)
CSN: [id number]     Arrival date & time 04/13/14  1049 History   First MD Initiated Contact with Patient 04/13/14 1153     Chief Complaint  Patient presents with  . Leg Pain  . Numbness  . Diarrhea     (Consider location/radiation/quality/duration/timing/severity/associated sxs/prior Treatment) The history is provided by the patient. The history is limited by the condition of the patient.    Patient with hx multiple sclerosis reports right leg pain that has been ongoing and gradually worsening for the past 4 weeks.  The pain began in her right foot, described as tingling/pins and needles, is painful to the touch.  The symptoms have gradually worsened and now extends up the lateral aspect of her right leg to her lower back.  Pain is sharp, stabbing, throbbing, and cramping, 8-10/10.  Reports weakness in this leg.  Has required support from another person in order to walk over the past two days.  Has had diarrhea, occasionally has not made it to the bathroom in time but is always aware when she needs to go.  She has had urinary hesitancy a few times this week.  Denies fevers, recent illness, recent injury.  Denies hx cancer, IVDU.  Has tried elevation and goody powder without any relief.  Her MS symptoms involve only shaking of her hands.  She has never experienced anything like this previously.   Has been on prednisone and Rebif in the past for her MS but does not tolerate either.  Pt has also had occasional garbled speech over the past few weeks.  States she sometimes has trouble finding a word and then garbles her speech, sometimes without being aware of it.   Reports mother has hx stroke, first stroke in her late 41s.    Past Medical History  Diagnosis Date  . Acid reflux   . Asthma   . History of blood transfusion   . Multiple sclerosis 2005    diagnosed in [place], failed steroids, no other medication  . Anxiety   . Gastric ulcer   . Migraine   . Chronic nausea     self reported  .  Chronic diarrhea     self reported  . Chronic pain     self reported chronic pain, back pain, fibromyalgia  . Insomnia    Past Surgical History  Procedure Laterality Date  . Gastric bypass    . Abdominal surgery  2003    weight loss of over 200 lbs   Family History  Problem Relation Age of Onset  . Hypertension Paternal Grandfather   . Colon cancer Paternal Grandfather 7  . Asthma Paternal Grandmother   . Hypertension Paternal Grandmother   . Breast cancer Paternal Grandmother   . Cancer Paternal Grandmother     lung   . Diabetes Maternal Grandmother   . Diabetes Maternal Grandfather   . Hypertension Father   . Urinary tract infection Mother   . Stroke Mother 38    3 strokes    History  Substance Use Topics  . Smoking status: Current Some Day Smoker    Types: Cigarettes    Last Attempt to Quit: 05/14/2011  . Smokeless tobacco: Never Used  . Alcohol Use: 1.2 oz/week    1 Cans of beer, 1 Shots of liquor per week   OB History   Grav Para Term Preterm Abortions TAB SAB Ect Mult Living   0         0  Review of Systems  All other systems reviewed and are negative.     Allergies  Cymbalta and Prednisone  Home Medications   Prior to Admission medications   Medication Sig Start Date End Date Taking? Authorizing Provider  clonazePAM (KLONOPIN) 0.5 MG tablet Take 0.5 mg by mouth 2 (two) times daily as needed for anxiety.   Yes Historical Provider, MD  HYDROcodone-acetaminophen (NORCO) 7.5-325 MG per tablet Take 1 tablet by mouth every 6 (six) hours as needed for moderate pain.   Yes Historical Provider, MD   BP 131/79  Pulse 85  Temp(Src) 98.2 F (36.8 C) (Oral)  Resp 19  SpO2 99%  LMP 04/11/2014 Physical Exam  Nursing note and vitals reviewed. Constitutional: She appears well-developed and well-nourished. No distress.  HENT:  Head: Normocephalic and atraumatic.  Eyes: Conjunctivae are normal.  Neck: Neck supple.  Cardiovascular: Intact distal pulses.    Pulmonary/Chest: Effort normal.  Abdominal: Soft. She exhibits no distension. There is no tenderness. There is no rebound and no guarding.  Genitourinary: Rectal exam shows anal tone normal.  Musculoskeletal:  Pt is generally weak with right leg vs pain limiting ability to participate.   Spine without stepoffs or crepitus, tender to palpation diffusely over lower lumbar spine.   She is very hypersensitive throughout the right lower leg and foot, pain with even light touch.   Patellar tendon reflexes intact and equal bilaterally.   Neurological: She is alert.  Skin: She is not diaphoretic.    ED Course  Procedures (including critical care time) Labs Review Labs Reviewed  CBC WITH DIFFERENTIAL - Abnormal; Notable for the following:    Hemoglobin 9.5 (*)    HCT 32.3 (*)    MCV 72.3 (*)    MCH 21.3 (*)    MCHC 29.4 (*)    RDW 19.2 (*)    Platelets 534 (*)    All other components within normal limits  BASIC METABOLIC PANEL - Abnormal; Notable for the following:    BUN 4 (*)    All other components within normal limits  URINALYSIS, ROUTINE W REFLEX MICROSCOPIC  PREGNANCY, URINE    Imaging Review Mr Lodema PilotBrain W Wo Contrast  04/13/2014   CLINICAL DATA:  33 year old female with multiple sclerosis. Low back pain radiating to the right lower extremity with weakness. Initial encounter.  EXAM: MRI LUMBAR SPINE WITHOUT AND WITH CONTRAST; MRI HEAD WITHOUT AND WITH CONTRAST  TECHNIQUE: Multiplanar and multiecho pulse sequences of the lumbar spine were obtained without and with intravenous contrast.; Multiplanar, multiecho pulse sequences of the brain and surrounding structures were obtained according to standard protocol without and with intravenous contrast  CONTRAST:  20mL MULTIHANCE GADOBENATE DIMEGLUMINE 529 MG/ML IV SOLN  COMPARISON:  CT Abdomen and Pelvis 03/13/2013.  Head CT 07/09/2012.  FINDINGS: MR LUMBAR FINDINGS:  Normal lumbar segmentation depicted in 2014. Stable vertebral height and  alignment. No marrow edema or evidence of acute osseous abnormality.  Visualized lower thoracic spinal cord is normal with conus medularis at L1. No abnormal intradural enhancement.  Visualized abdominal viscera and paraspinal soft tissues are within normal limits. Trace pelvic free fluid likely is physiologic.  T10-T11:  Partially visible, grossly negative.  T11-T12:  Negative.  T12-L1:  Negative.  L1-L2:  Negative.  L2-L3:  Negative.  L3-L4:  Negative except for mild facet hypertrophy on the left.  L4-L5: Disc desiccation and disc space loss. Right paracentral disc herniation with large round rim enhancing space-occupying mass in the right lateral recess and ventral  epidural space tracking posterior to the L5 vertebral body (series 800, images 27-30 and sagittal series 400, image 6). This lesion most resembles an oval sequestered disc fragment measuring 14 x 13 x 20 mm (AP by transverse by CC)  Superimposed mild to moderate facet hypertrophy. Trace facet joint fluid. Severe right lateral recess stenosis at the level of the descending and exiting right L5 nerve. There is also mass effect on the descending right S1 nerve roots. No significant spinal stenosis overall. No L4 foraminal involvement. Mild epidural lipomatosis also noted.  L5-S1: Negative disc. Mild facet hypertrophy. Mild epidural lipomatosis. Abnormal sequestered disc fragment as above. No superimposed L5 foraminal stenosis.  MR BRAIN FINDINGS:  Cerebral volume is normal. No restricted diffusion to suggest acute infarction. No midline shift, mass effect, evidence of mass lesion, ventriculomegaly, extra-axial collection or acute intracranial hemorrhage. Cervicomedullary junction and pituitary are within normal limits. Grossly negative visualized cervical spine. Major intracranial vascular flow voids are preserved.  Nodular periventricular T2 and FLAIR hyperintense white matter lesions, many oriented perpendicular to the lateral ventricles. Some  superimposed subcortical white matter involvement. No temporal lobe involvement identified. No lesion shows restricted diffusion. Following contrast There is suggested faint enhancement of a right centrum semiovale shin (series 7, image 18 and series 12, image 18). Mild vascular pulsation artifact also noted on post-contrast images. No definite additional abnormal enhancement.  Deep gray matter nuclei and brainstem are spared. Abnormal T2 and FLAIR hyperintensity in the central left cerebellum, without enhancement.  Visible internal auditory structures appear normal. Visualized paranasal sinuses and mastoids are clear. Grossly normal orbits soft tissues. Visualized bone marrow signal is within normal limits. Visualized scalp soft tissues are within normal limits.  IMPRESSION: 1. L4-L5 disc herniation and 2 cm sequestered disc fragment, severely affecting the course of the descending and exiting right L5 nerve. Descending right S1 nerve roots also affected. No significant spinal stenosis. 2. Signal abnormality in the cerebral and cerebellar white matter most compatible with multiple sclerosis. Small area of acute demyelination suggested in the right centrum semiovale on the basis of faint enhancement.   Electronically Signed   By: Augusto Gamble M.D.   On: 04/13/2014 18:38   Mr Lumbar Spine W Wo Contrast  04/13/2014   CLINICAL DATA:  33 year old female with multiple sclerosis. Low back pain radiating to the right lower extremity with weakness. Initial encounter.  EXAM: MRI LUMBAR SPINE WITHOUT AND WITH CONTRAST; MRI HEAD WITHOUT AND WITH CONTRAST  TECHNIQUE: Multiplanar and multiecho pulse sequences of the lumbar spine were obtained without and with intravenous contrast.; Multiplanar, multiecho pulse sequences of the brain and surrounding structures were obtained according to standard protocol without and with intravenous contrast  CONTRAST:  20mL MULTIHANCE GADOBENATE DIMEGLUMINE 529 MG/ML IV SOLN  COMPARISON:  CT  Abdomen and Pelvis 03/13/2013.  Head CT 07/09/2012.  FINDINGS: MR LUMBAR FINDINGS:  Normal lumbar segmentation depicted in 2014. Stable vertebral height and alignment. No marrow edema or evidence of acute osseous abnormality.  Visualized lower thoracic spinal cord is normal with conus medularis at L1. No abnormal intradural enhancement.  Visualized abdominal viscera and paraspinal soft tissues are within normal limits. Trace pelvic free fluid likely is physiologic.  T10-T11:  Partially visible, grossly negative.  T11-T12:  Negative.  T12-L1:  Negative.  L1-L2:  Negative.  L2-L3:  Negative.  L3-L4:  Negative except for mild facet hypertrophy on the left.  L4-L5: Disc desiccation and disc space loss. Right paracentral disc herniation with large round rim enhancing  space-occupying mass in the right lateral recess and ventral epidural space tracking posterior to the L5 vertebral body (series 800, images 27-30 and sagittal series 400, image 6). This lesion most resembles an oval sequestered disc fragment measuring 14 x 13 x 20 mm (AP by transverse by CC)  Superimposed mild to moderate facet hypertrophy. Trace facet joint fluid. Severe right lateral recess stenosis at the level of the descending and exiting right L5 nerve. There is also mass effect on the descending right S1 nerve roots. No significant spinal stenosis overall. No L4 foraminal involvement. Mild epidural lipomatosis also noted.  L5-S1: Negative disc. Mild facet hypertrophy. Mild epidural lipomatosis. Abnormal sequestered disc fragment as above. No superimposed L5 foraminal stenosis.  MR BRAIN FINDINGS:  Cerebral volume is normal. No restricted diffusion to suggest acute infarction. No midline shift, mass effect, evidence of mass lesion, ventriculomegaly, extra-axial collection or acute intracranial hemorrhage. Cervicomedullary junction and pituitary are within normal limits. Grossly negative visualized cervical spine. Major intracranial vascular flow voids  are preserved.  Nodular periventricular T2 and FLAIR hyperintense white matter lesions, many oriented perpendicular to the lateral ventricles. Some superimposed subcortical white matter involvement. No temporal lobe involvement identified. No lesion shows restricted diffusion. Following contrast There is suggested faint enhancement of a right centrum semiovale shin (series 7, image 18 and series 12, image 18). Mild vascular pulsation artifact also noted on post-contrast images. No definite additional abnormal enhancement.  Deep gray matter nuclei and brainstem are spared. Abnormal T2 and FLAIR hyperintensity in the central left cerebellum, without enhancement.  Visible internal auditory structures appear normal. Visualized paranasal sinuses and mastoids are clear. Grossly normal orbits soft tissues. Visualized bone marrow signal is within normal limits. Visualized scalp soft tissues are within normal limits.  IMPRESSION: 1. L4-L5 disc herniation and 2 cm sequestered disc fragment, severely affecting the course of the descending and exiting right L5 nerve. Descending right S1 nerve roots also affected. No significant spinal stenosis. 2. Signal abnormality in the cerebral and cerebellar white matter most compatible with multiple sclerosis. Small area of acute demyelination suggested in the right centrum semiovale on the basis of faint enhancement.   Electronically Signed   By: Augusto Gamble M.D.   On: 04/13/2014 18:38     EKG Interpretation   Date/Time:  Tuesday April 13 2014 11:40:32 EDT Ventricular Rate:  82 PR Interval:  148 QRS Duration: 81 QT Interval:  406 QTC Calculation: 474 R Axis:   71 Text Interpretation:  Sinus rhythm Normal ECG No old tracing to compare  Confirmed by GOLDSTON  MD, SCOTT ([id number]) on 04/13/2014 1:12:05 PM      1:15 PM Discussed pt with Dr Criss Alvine.   Pt searched on DEA database - #16 percocet 5-325 prescribed 01/27/14 - this is the only narcotic prescribed in the past 6 months.     3:34 PM Discussed pt with Dr Amada Jupiter who will see the patient.  Have added MR brain w and wo per his recommendation.    MDM   Final diagnoses:  Herniated disc    Pt with hx MS low back and right lower extremity pain, weakness, hypersensitivity ongoing and gradually worsening over several weeks.  Found to have L4-5 disc herniation with disc fragment.  Affecting L5, S1 nerves.  Dr Venetia Maxon to see and likely admit patient.      Trixie Dredge, PA-C 04/13/14 2003

## 2014-04-13 NOTE — H&P (Signed)
Subjective: Patient is a 33 y.o. female who complains of severe right leg pain and numbness. Onset of symptoms was 1 month ago, gradually worsening since that time.  Onset was related to no known injury and was of gradual onset. The pain is rated unremitting, and is located at the across the lower back or radiating to right leg(s). The pain is described as stabbing and occurs all day. The symptoms has been progressive. Symptoms are exacerbated by standing. The patient has tried relief with rest. MRI showed a large right free fragment disc herniation at L 45 with caudally migrated free fragment.  Patient Active Problem List   Diagnosis Date Noted  . Acid reflux   . History of blood transfusion   . Multiple sclerosis 03/13/2012  . 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 Acid reflux   . Asthma   . History of blood transfusion   . Multiple sclerosis 2005    [place] in [place], failed steroids, no other medication  . Anxiety   . Gastric ulcer   . Migraine   . Chronic nausea     self reported  . Chronic diarrhea     self reported  . Chronic pain     self reported chronic pain, back pain, fibromyalgia  . Insomnia     Past Surgical History  Procedure Laterality Date  . Gastric bypass    . Abdominal surgery  2003    weight loss of over 200 lbs     (Not in a hospital admission) Allergies  Allergen Reactions  . Cymbalta [Duloxetine Hcl]     Made body jerk and twitch, spasms  . Prednisone     Steroids in general    History  Substance Use Topics  . Smoking status: Current Some Day Smoker    Types: Cigarettes    Last Attempt to Quit: 05/14/2011  . Smokeless tobacco: Never Used  . Alcohol Use: 1.2 oz/week    1 Cans of beer, 1 Shots of liquor per week    Family History  Problem Relation Age of Onset  . Hypertension Paternal Grandfather   . Colon cancer Paternal Grandfather [id number]  . Asthma Paternal Grandmother   . Hypertension Paternal Grandmother    . Breast cancer Paternal Grandmother   . Cancer Paternal Grandmother     lung   . Diabetes Maternal Grandmother   . Diabetes Maternal Grandfather   . Hypertension Father   . Urinary tract infection Mother   . Stroke Mother [id number]    3 strokes     Review of Systems A comprehensive review of systems was negative.Pt notes she weighed 600 lbs prior to bypass and she smokes several cigarettes a day  Objective: Vital signs in last 24 hours: Temp:  [98.2 F (36.8 C)-98.6 F (37 C)] 98.6 F (37 C) (06/16 1842) Pulse Rate:  [76-90] 84 (06/16 1842) Resp:  [12-28] 16 (06/16 1842) BP: (111-137)/(60-80) 128/75 mmHg (06/16 1842) SpO2:  [94 %-100 %] 99 % (06/16 1842)  BP 133/84  Pulse 84  Temp(Src) 98.6 F (37 C) (Oral)  Resp 12  SpO2 96%  LMP 04/11/2014  General Appearance:    Alert, cooperative, no distress, appears stated age, obese  Head:    Normocephalic, without obvious abnormality, atraumatic  Eyes:    PERRL, conjunctiva/corneas clear, EOM's intact, fundi    benign, both eyes  Ears:    Normal TM's and external ear canals, both ears  Nose:  Nares normal, septum midline, mucosa normal, no drainage    or sinus tenderness  Throat:   Lips, mucosa, and tongue normal; teeth and gums normal  Neck:   Supple, symmetrical, trachea midline, no adenopathy;    thyroid:  no enlargement/tenderness/nodules; no carotid   bruit or JVD  Back:     Symmetric, no curvature, ROM limited due to pain, no CVA tenderness  Lungs:     Clear to auscultation bilaterally, respirations unlabored  Chest Wall:    No tenderness or deformity   Heart:    Regular rate and rhythm, S1 and S2 normal, no murmur, rub   or gallop  Breast Exam:    No tenderness, masses, or nipple abnormality  Abdomen:     Soft, non-tender, bowel sounds active all four quadrants,    no masses, no organomegaly  Genitalia:    Normal female without lesion, discharge or tenderness  Rectal:    Normal tone, normal prostate, no masses or  tenderness;   guaiac negative stool  Extremities:   Extremities normal, atraumatic, no cyanosis or edema  Pulses:   2+ and symmetric all extremities  Skin:   Skin color, texture, turgor normal, no rashes or lesions  Lymph nodes:   Cervical, supraclavicular, and axillary nodes normal  Neurologic:   CNII-XII intact, normal strength, sensation and reflexes    Throughout, with exception of right DF 4/5 and positive SLR 10 degrees right with numbness in right leg    Data ReviewRadiology review: MRI as above  Assessment/Plan: Patient has large right L 45 disc herniation with caudally migrated free fragment.  Will admit for pain control and surgery ASAP.   Dorian Heckle, MD 04/13/2014 7:29 PM

## 2014-04-13 NOTE — ED Notes (Signed)
OR permit signed by pt after procedure explained by Dr. Venetia Maxon.

## 2014-04-14 ENCOUNTER — Encounter (HOSPITAL_COMMUNITY): Payer: Self-pay | Admitting: Anesthesiology

## 2014-04-14 ENCOUNTER — Inpatient Hospital Stay (HOSPITAL_COMMUNITY): Payer: BC Managed Care – PPO

## 2014-04-14 ENCOUNTER — Encounter (HOSPITAL_COMMUNITY): Payer: Self-pay | Admitting: Certified Registered"

## 2014-04-14 ENCOUNTER — Encounter (HOSPITAL_COMMUNITY)
Admission: EM | Disposition: A | Discharge status: Home / Self Care | Payer: Self-pay | Source: Home / Self Care | Attending: Neurosurgery

## 2014-04-14 ENCOUNTER — Inpatient Hospital Stay (HOSPITAL_COMMUNITY): Payer: Self-pay | Admitting: Certified Registered"

## 2014-04-14 HISTORY — PX: LUMBAR LAMINECTOMY/DECOMPRESSION MICRODISCECTOMY: SHX5026

## 2014-04-14 SURGERY — LUMBAR LAMINECTOMY/DECOMPRESSION MICRODISCECTOMY 1 LEVEL
Anesthesia: General | Laterality: Right

## 2014-04-14 MED ORDER — PROPOFOL 10 MG/ML IV BOLUS
INTRAVENOUS | Status: DC | PRN
  Administered 2014-04-14: 200 mg via INTRAVENOUS

## 2014-04-14 MED ORDER — ONDANSETRON HCL 4 MG/2ML IJ SOLN: 4.0000 mg | INTRAMUSCULAR | Status: DC | PRN

## 2014-04-14 MED ORDER — DOCUSATE SODIUM 100 MG PO CAPS: 100.0000 mg | ORAL_CAPSULE | Freq: Two times a day (BID) | ORAL | Status: DC

## 2014-04-14 MED ORDER — ALUM & MAG HYDROXIDE-SIMETH 200-200-20 MG/5ML PO SUSP: 30.0000 mL | Freq: Four times a day (QID) | ORAL | Status: DC | PRN

## 2014-04-14 MED ORDER — LIDOCAINE-EPINEPHRINE 1 %-1:100000 IJ SOLN
INTRAMUSCULAR | Status: DC | PRN
  Administered 2014-04-14: 5 mL

## 2014-04-14 MED ORDER — FENTANYL CITRATE 0.05 MG/ML IJ SOLN
INTRAMUSCULAR | Status: AC
  Filled 2014-04-14: qty 5

## 2014-04-14 MED ORDER — FLEET ENEMA 7-19 GM/118ML RE ENEM: 1.0000 | ENEMA | Freq: Once | RECTAL | Status: AC | PRN

## 2014-04-14 MED ORDER — SODIUM CHLORIDE 0.9 % IV SOLN
250.0000 mL | INTRAVENOUS | Status: DC
Start: 2014-04-14 — End: 2014-04-16

## 2014-04-14 MED ORDER — METHOCARBAMOL 500 MG PO TABS
500.0000 mg | ORAL_TABLET | Freq: Four times a day (QID) | ORAL | Status: DC | PRN
  Administered 2014-04-14 – 2014-04-16 (×5): 500 mg via ORAL
  Filled 2014-04-14 (×5): qty 1

## 2014-04-14 MED ORDER — PHENOL 1.4 % MT LIQD: 1.0000 | OROMUCOSAL | Status: DC | PRN

## 2014-04-14 MED ORDER — THROMBIN 5000 UNITS EX SOLR
CUTANEOUS | Status: DC | PRN
  Administered 2014-04-14 (×2): 5000 [IU] via TOPICAL

## 2014-04-14 MED ORDER — SODIUM CHLORIDE 0.9 % IJ SOLN
3.0000 mL | Freq: Two times a day (BID) | INTRAMUSCULAR | Status: DC
  Administered 2014-04-14 – 2014-04-15 (×3): 3 mL via INTRAVENOUS

## 2014-04-14 MED ORDER — ACETAMINOPHEN 650 MG RE SUPP: 650.0000 mg | RECTAL | Status: DC | PRN

## 2014-04-14 MED ORDER — CEFAZOLIN SODIUM 1-5 GM-% IV SOLN
INTRAVENOUS | Status: AC
Start: 2014-04-14 — End: 2014-04-14
  Administered 2014-04-14: 3 g via INTRAVENOUS
  Filled 2014-04-14: qty 50

## 2014-04-14 MED ORDER — MENTHOL 3 MG MT LOZG: 1.0000 | LOZENGE | OROMUCOSAL | Status: DC | PRN

## 2014-04-14 MED ORDER — SENNA 8.6 MG PO TABS: 1.0000 | ORAL_TABLET | Freq: Two times a day (BID) | ORAL | Status: DC

## 2014-04-14 MED ORDER — LIDOCAINE HCL (CARDIAC) 20 MG/ML IV SOLN
INTRAVENOUS | Status: DC | PRN
  Administered 2014-04-14: 80 mg via INTRAVENOUS

## 2014-04-14 MED ORDER — HYDROCODONE-ACETAMINOPHEN 7.5-325 MG PO TABS: 1.0000 | ORAL_TABLET | Freq: Four times a day (QID) | ORAL | Status: DC | PRN

## 2014-04-14 MED ORDER — FENTANYL CITRATE 0.05 MG/ML IJ SOLN
INTRAMUSCULAR | Status: DC | PRN
  Administered 2014-04-14: 100 ug via INTRAVENOUS
  Administered 2014-04-14 (×2): 50 ug via INTRAVENOUS

## 2014-04-14 MED ORDER — MIDAZOLAM HCL 2 MG/2ML IJ SOLN
INTRAMUSCULAR | Status: AC
  Filled 2014-04-14: qty 2

## 2014-04-14 MED ORDER — PROPOFOL 10 MG/ML IV BOLUS
INTRAVENOUS | Status: AC
  Filled 2014-04-14: qty 20

## 2014-04-14 MED ORDER — ACETAMINOPHEN 325 MG PO TABS: 650.0000 mg | ORAL_TABLET | ORAL | Status: DC | PRN

## 2014-04-14 MED ORDER — BUPIVACAINE HCL (PF) 0.5 % IJ SOLN
INTRAMUSCULAR | Status: DC | PRN
  Administered 2014-04-14: 5 mL

## 2014-04-14 MED ORDER — ROCURONIUM BROMIDE 50 MG/5ML IV SOLN
INTRAVENOUS | Status: AC
  Filled 2014-04-14: qty 1

## 2014-04-14 MED ORDER — ONDANSETRON HCL 4 MG/2ML IJ SOLN
INTRAMUSCULAR | Status: DC | PRN
  Administered 2014-04-14: 4 mg via INTRAVENOUS

## 2014-04-14 MED ORDER — SODIUM CHLORIDE 0.9 % IJ SOLN: 3.0000 mL | INTRAMUSCULAR | Status: DC | PRN

## 2014-04-14 MED ORDER — FENTANYL CITRATE 0.05 MG/ML IJ SOLN
INTRAMUSCULAR | Status: AC
  Filled 2014-04-14: qty 2

## 2014-04-14 MED ORDER — LACTATED RINGERS IV SOLN
INTRAVENOUS | Status: DC | PRN
  Administered 2014-04-14: 13:00:00 via INTRAVENOUS

## 2014-04-14 MED ORDER — OXYCODONE-ACETAMINOPHEN 5-325 MG PO TABS: 1.0000 | ORAL_TABLET | ORAL | Status: DC | PRN

## 2014-04-14 MED ORDER — CEFAZOLIN SODIUM 1-5 GM-% IV SOLN
1.0000 g | Freq: Three times a day (TID) | INTRAVENOUS | Status: AC
  Administered 2014-04-14 – 2014-04-15 (×2): 1 g via INTRAVENOUS
  Filled 2014-04-14 (×3): qty 50

## 2014-04-14 MED ORDER — DIAZEPAM 5 MG PO TABS
ORAL_TABLET | ORAL | Status: AC
  Administered 2014-04-14: 5 mg
  Filled 2014-04-14: qty 1

## 2014-04-14 MED ORDER — HYDROMORPHONE HCL PF 1 MG/ML IJ SOLN
0.5000 mg | INTRAMUSCULAR | Status: AC | PRN
  Administered 2014-04-14: 0.5 mg via INTRAVENOUS
  Filled 2014-04-14: qty 1

## 2014-04-14 MED ORDER — PANTOPRAZOLE SODIUM 40 MG IV SOLR: 40.0000 mg | Freq: Every day | INTRAVENOUS | Status: DC

## 2014-04-14 MED ORDER — ONDANSETRON HCL 4 MG/2ML IJ SOLN: 4.0000 mg | Freq: Once | INTRAMUSCULAR | Status: DC | PRN

## 2014-04-14 MED ORDER — HYDROMORPHONE HCL PF 1 MG/ML IJ SOLN
0.2500 mg | INTRAMUSCULAR | Status: DC | PRN
Start: 2014-04-14 — End: 2014-04-14
  Administered 2014-04-14 (×2): 0.5 mg via INTRAVENOUS

## 2014-04-14 MED ORDER — BISACODYL 10 MG RE SUPP: 10.0000 mg | Freq: Every day | RECTAL | Status: DC | PRN

## 2014-04-14 MED ORDER — FENTANYL CITRATE 0.05 MG/ML IJ SOLN
INTRAMUSCULAR | Status: DC | PRN
  Administered 2014-04-14: 100 ug via INTRAVENOUS

## 2014-04-14 MED ORDER — LIDOCAINE HCL (CARDIAC) 20 MG/ML IV SOLN
INTRAVENOUS | Status: AC
  Filled 2014-04-14: qty 5

## 2014-04-14 MED ORDER — MORPHINE SULFATE 2 MG/ML IJ SOLN: 1.0000 mg | INTRAMUSCULAR | Status: DC | PRN

## 2014-04-14 MED ORDER — METHYLPREDNISOLONE ACETATE 80 MG/ML IJ SUSP
INTRAMUSCULAR | Status: DC | PRN
  Administered 2014-04-14: 80 mg

## 2014-04-14 MED ORDER — HEMOSTATIC AGENTS (NO CHARGE) OPTIME
TOPICAL | Status: DC | PRN
  Administered 2014-04-14: 1 via TOPICAL

## 2014-04-14 MED ORDER — HYDROMORPHONE HCL PF 1 MG/ML IJ SOLN
INTRAMUSCULAR | Status: AC
  Administered 2014-04-14: 0.5 mg via INTRAVENOUS
  Filled 2014-04-14: qty 1

## 2014-04-14 MED ORDER — MIDAZOLAM HCL 5 MG/5ML IJ SOLN
INTRAMUSCULAR | Status: DC | PRN
  Administered 2014-04-14: 2 mg via INTRAVENOUS

## 2014-04-14 MED ORDER — CLONAZEPAM 0.5 MG PO TABS: 0.5000 mg | ORAL_TABLET | Freq: Two times a day (BID) | ORAL | Status: DC | PRN

## 2014-04-14 MED ORDER — HYDROCODONE-ACETAMINOPHEN 5-325 MG PO TABS: 1.0000 | ORAL_TABLET | ORAL | Status: DC | PRN

## 2014-04-14 MED ORDER — KCL IN DEXTROSE-NACL 20-5-0.45 MEQ/L-%-% IV SOLN: INTRAVENOUS | Status: DC

## 2014-04-14 MED ORDER — DEXAMETHASONE SODIUM PHOSPHATE 4 MG/ML IJ SOLN
INTRAMUSCULAR | Status: DC | PRN
  Administered 2014-04-14: 4 mg via INTRAVENOUS

## 2014-04-14 MED ORDER — SUFENTANIL CITRATE 50 MCG/ML IV SOLN
INTRAVENOUS | Status: AC
  Filled 2014-04-14: qty 1

## 2014-04-14 MED ORDER — LIDOCAINE HCL (CARDIAC) 20 MG/ML IV SOLN
INTRAVENOUS | Status: AC
  Filled 2014-04-14: qty 10

## 2014-04-14 MED ORDER — METHOCARBAMOL 1000 MG/10ML IJ SOLN
500.0000 mg | Freq: Four times a day (QID) | INTRAVENOUS | Status: DC | PRN
  Filled 2014-04-14: qty 5

## 2014-04-14 MED ORDER — SENNOSIDES-DOCUSATE SODIUM 8.6-50 MG PO TABS: 1.0000 | ORAL_TABLET | Freq: Every evening | ORAL | Status: DC | PRN

## 2014-04-14 MED ORDER — ARTIFICIAL TEARS OP OINT
TOPICAL_OINTMENT | OPHTHALMIC | Status: DC | PRN
  Administered 2014-04-14: 1 via OPHTHALMIC

## 2014-04-14 SURGICAL SUPPLY — 61 items
BAG DECANTER FOR FLEXI CONT (MISCELLANEOUS) ×2 IMPLANT
BENZOIN TINCTURE PRP APPL 2/3 (GAUZE/BANDAGES/DRESSINGS) IMPLANT
BIT DRILL NEURO 2X3.1 SFT TUCH (MISCELLANEOUS) ×1 IMPLANT
BLADE 10 SAFETY STRL DISP (BLADE) ×2 IMPLANT
BLADE SURG ROTATE 9660 (MISCELLANEOUS) IMPLANT
BUR ROUND FLUTED 5 RND (BURR) ×2 IMPLANT
CANISTER SUCT 3000ML (MISCELLANEOUS) ×2 IMPLANT
CONT SPEC 4OZ CLIKSEAL STRL BL (MISCELLANEOUS) ×2 IMPLANT
DERMABOND ADVANCED (GAUZE/BANDAGES/DRESSINGS) ×1
DERMABOND ADVANCED .7 DNX12 (GAUZE/BANDAGES/DRESSINGS) ×1 IMPLANT
DRAPE LAPAROTOMY 100X72X124 (DRAPES) ×2 IMPLANT
DRAPE MICROSCOPE LEICA (MISCELLANEOUS) ×2 IMPLANT
DRAPE POUCH INSTRU U-SHP 10X18 (DRAPES) ×2 IMPLANT
DRAPE SURG 17X23 STRL (DRAPES) ×2 IMPLANT
DRILL NEURO 2X3.1 SOFT TOUCH (MISCELLANEOUS) ×2
DRSG TELFA 3X8 NADH (GAUZE/BANDAGES/DRESSINGS) IMPLANT
DURAPREP 26ML APPLICATOR (WOUND CARE) ×2 IMPLANT
ELECT BLADE 4.0 EZ CLEAN MEGAD (MISCELLANEOUS) ×2
ELECT REM PT RETURN 9FT ADLT (ELECTROSURGICAL) ×2
ELECTRODE BLDE 4.0 EZ CLN MEGD (MISCELLANEOUS) ×1 IMPLANT
ELECTRODE REM PT RTRN 9FT ADLT (ELECTROSURGICAL) ×1 IMPLANT
GAUZE SPONGE 4X4 16PLY XRAY LF (GAUZE/BANDAGES/DRESSINGS) IMPLANT
GLOVE BIO SURGEON STRL SZ8 (GLOVE) ×2 IMPLANT
GLOVE BIOGEL PI IND STRL 7.5 (GLOVE) ×2 IMPLANT
GLOVE BIOGEL PI IND STRL 8 (GLOVE) ×1 IMPLANT
GLOVE BIOGEL PI IND STRL 8.5 (GLOVE) ×1 IMPLANT
GLOVE BIOGEL PI INDICATOR 7.5 (GLOVE) ×2
GLOVE BIOGEL PI INDICATOR 8 (GLOVE) ×1
GLOVE BIOGEL PI INDICATOR 8.5 (GLOVE) ×1
GLOVE ECLIPSE 6.5 STRL STRAW (GLOVE) ×2 IMPLANT
GLOVE ECLIPSE 8.0 STRL XLNG CF (GLOVE) ×2 IMPLANT
GLOVE EXAM NITRILE LRG STRL (GLOVE) IMPLANT
GLOVE EXAM NITRILE MD LF STRL (GLOVE) ×2 IMPLANT
GLOVE EXAM NITRILE XL STR (GLOVE) IMPLANT
GLOVE EXAM NITRILE XS STR PU (GLOVE) IMPLANT
GLOVE SURG SS PI 7.0 STRL IVOR (GLOVE) ×4 IMPLANT
GOWN STRL REUS W/ TWL LRG LVL3 (GOWN DISPOSABLE) ×2 IMPLANT
GOWN STRL REUS W/ TWL XL LVL3 (GOWN DISPOSABLE) ×2 IMPLANT
GOWN STRL REUS W/TWL 2XL LVL3 (GOWN DISPOSABLE) IMPLANT
GOWN STRL REUS W/TWL LRG LVL3 (GOWN DISPOSABLE) ×2
GOWN STRL REUS W/TWL XL LVL3 (GOWN DISPOSABLE) ×2
KIT BASIN OR (CUSTOM PROCEDURE TRAY) ×2 IMPLANT
KIT ROOM TURNOVER OR (KITS) ×2 IMPLANT
NEEDLE HYPO 18GX1.5 BLUNT FILL (NEEDLE) ×2 IMPLANT
NEEDLE HYPO 25X1 1.5 SAFETY (NEEDLE) ×2 IMPLANT
NS IRRIG 1000ML POUR BTL (IV SOLUTION) ×2 IMPLANT
PACK LAMINECTOMY NEURO (CUSTOM PROCEDURE TRAY) ×2 IMPLANT
PAD ARMBOARD 7.5X6 YLW CONV (MISCELLANEOUS) ×6 IMPLANT
RUBBERBAND STERILE (MISCELLANEOUS) ×4 IMPLANT
SPONGE GAUZE 4X4 12PLY (GAUZE/BANDAGES/DRESSINGS) IMPLANT
SPONGE SURGIFOAM ABS GEL SZ50 (HEMOSTASIS) ×2 IMPLANT
STRIP CLOSURE SKIN 1/2X4 (GAUZE/BANDAGES/DRESSINGS) IMPLANT
SUT VIC AB 0 CT1 18XCR BRD8 (SUTURE) ×1 IMPLANT
SUT VIC AB 0 CT1 8-18 (SUTURE) ×1
SUT VIC AB 2-0 CT1 18 (SUTURE) ×2 IMPLANT
SUT VIC AB 3-0 SH 8-18 (SUTURE) ×2 IMPLANT
SYR 20ML ECCENTRIC (SYRINGE) ×2 IMPLANT
SYR 5ML LL (SYRINGE) ×2 IMPLANT
TOWEL OR 17X24 6PK STRL BLUE (TOWEL DISPOSABLE) ×2 IMPLANT
TOWEL OR 17X26 10 PK STRL BLUE (TOWEL DISPOSABLE) ×2 IMPLANT
WATER STERILE IRR 1000ML POUR (IV SOLUTION) ×2 IMPLANT

## 2014-04-14 NOTE — Progress Notes (Signed)
Received pt from the ED a&o x4. Vital signs taken, oriented to room and unit. Call bell within reach. Pt will be NPO-P-MN according to MD orders for OR in the morning.

## 2014-04-14 NOTE — Anesthesia Preprocedure Evaluation (Addendum)
Anesthesia Evaluation  Patient identified by MRN, date of birth, ID band Patient awake    Reviewed: Allergy & Precautions, H&P , NPO status , Patient's Chart, lab work & pertinent test results  Airway       Dental   Pulmonary asthma , Current Smoker,          Cardiovascular     Neuro/Psych  Headaches,  Neuromuscular disease    GI/Hepatic PUD, GERD-  ,  Endo/Other    Renal/GU      Musculoskeletal   Abdominal   Peds  Hematology   Anesthesia Other Findings   Reproductive/Obstetrics                          Anesthesia Physical Anesthesia Plan  ASA: II  Anesthesia Plan: General   Post-op Pain Management:    Induction: Intravenous  Airway Management Planned: Oral ETT  Additional Equipment:   Intra-op Plan:   Post-operative Plan: Extubation in OR  Informed Consent: I have reviewed the patients History and Physical, chart, labs and discussed the procedure including the risks, benefits and alternatives for the proposed anesthesia with the patient or authorized representative who has indicated his/her understanding and acceptance.     Plan Discussed with:   Anesthesia Plan Comments:        Anesthesia Quick Evaluation

## 2014-04-14 NOTE — Brief Op Note (Signed)
04/13/2014 - 04/14/2014  3:05 PM  PATIENT:  Beth [name]  33 y.o. female  PRE-OPERATIVE DIAGNOSIS:  Lumbar Four-Five Disc Herniation with radiculopathy, stenosis, Degenerative disc disease Right  POST-OPERATIVE DIAGNOSIS:   Lumbar Four-Five Disc Herniation with radiculopathy, stenosis, Degenerative disc disease Right  PROCEDURE:  Procedure(s) with comments: LUMBAR LAMINECTOMY/DECOMPRESSION MICRODISCECTOMY LEVEL L4-5 (Right) - LUMBAR LAMINECTOMY/DECOMPRESSION MICRODISCECTOMY LEVEL L4-5  SURGEON:  Surgeon(s) and Role:    * Maeola Harman, MD - Primary    * Carmela Hurt, MD - Assisting  PHYSICIAN ASSISTANT:   ASSISTANTS: none   ANESTHESIA:   general  EBL:  Total I/O In: 700 [I.V.:700] Out: 40 [Blood:40]  BLOOD ADMINISTERED:none  DRAINS: none   LOCAL MEDICATIONS USED:  LIDOCAINE   SPECIMEN:  No Specimen  DISPOSITION OF SPECIMEN:  N/A  COUNTS:  YES  TOURNIQUET:  * No tourniquets in log *  DICTATION: DICTATION: Patient has a large L4-5 disc rupture on the right with significant right leg weakness. It was elected to take her to surgery for right L4-5 microdiscectomy.  Procedure: Patient was brought to the operating room and following the smooth and uncomplicated induction of general endotracheal anesthesia she was placed in a prone position on the Wilson frame. Low back was prepped and draped in the usual sterile fashion with betadine scrub and DuraPrep. Area of planned incision was infiltrated with local lidocaine. Preoperative localizing X ray was obtained with a spinal needle. Incision was made in the midline and carried to the lumbodorsal fascia which was incised on the right side of midline. Subperiosteal dissection was performed exposing what was felt to be L45 level. Intraoperative x-ray demonstrated marker probes at L4-5. A hemi-semi-laminectomy of L4 was performed a high-speed drill and completed with Kerrison rongeurs and a generous foraminotomy was performed overlying the  superior aspect of the L5 lamina. Ligamentum flavum was detached and removed in a piecemeal fashion and the L5 nerve root was decompressed laterally with removal of the superior aspect of the facet and ligamentum causing nerve root compression. The microscope was brought into the field and the L5 nerve root was mobilized medially. This exposed a large amount of soft disc material and a free fragment of herniated disc material within the axilla of the L 5 nerve root. Multiple fragments were removed and the nerve root and thecal sac were decompressed.  At this point it was felt that all neural elements were well decompressed and there was no evidence of residual loose disc material nor herniation from the interspace. The wound was then irrigated with  saline and no additional disc material was mobilized. Hemostasis was assured with bipolar electrocautery and the interspace was irrigated with Depo-Medrol and fentanyl. The lumbodorsal fascia was closed with 0 Vicryl sutures the subcutaneous tissues reapproximated 2-0 Vicryl inverted sutures and the skin edges were reapproximated with 3-0 Vicryl subcuticular stitch. The wound is dressed with Dermabond. Patient was extubated in the operating room and taken to recovery in stable and satisfactory condition having tolerated his operation well counts were correct at the end of the case.  PLAN OF CARE: Admit for overnight observation  PATIENT DISPOSITION:  PACU - hemodynamically stable.   Delay start of Pharmacological VTE agent (>24hrs) due to surgical blood loss or risk of bleeding: yes

## 2014-04-14 NOTE — Progress Notes (Signed)
Subjective: Patient reports still lots of pain  Objective: Vital signs in last 24 hours: Temp:  [97.6 F (36.4 C)-98.6 F (37 C)] 97.6 F (36.4 C) (06/17 0644) Pulse Rate:  [76-95] 76 (06/17 0644) Resp:  [12-28] 18 (06/17 0644) BP: (111-137)/(60-89) 126/76 mmHg (06/17 0644) SpO2:  [94 %-100 %] 100 % (06/17 0644) Weight:  [117.209 kg (258 lb 6.4 oz)] 117.209 kg (258 lb 6.4 oz) (06/16 2100)  Intake/Output from previous day:   Intake/Output this shift:    Physical Exam: Strength stable.  Resting in bed.  Lab Results:  Recent Labs  04/13/14 1339  WBC 7.1  HGB 9.5*  HCT 32.3*  PLT 534*   BMET  Recent Labs  04/13/14 1339  NA 139  K 4.6  CL 101  CO2 23  GLUCOSE 87  BUN 4*  CREATININE 0.61  CALCIUM 9.0    Studies/Results: Mr Lodema Pilot Contrast  04/13/2014   CLINICAL DATA:  33 year old female with multiple sclerosis. Low back pain radiating to the right lower extremity with weakness. Initial encounter.  EXAM: MRI LUMBAR SPINE WITHOUT AND WITH CONTRAST; MRI HEAD WITHOUT AND WITH CONTRAST  TECHNIQUE: Multiplanar and multiecho pulse sequences of the lumbar spine were obtained without and with intravenous contrast.; Multiplanar, multiecho pulse sequences of the brain and surrounding structures were obtained according to standard protocol without and with intravenous contrast  CONTRAST:  20mL MULTIHANCE GADOBENATE DIMEGLUMINE 529 MG/ML IV SOLN  COMPARISON:  CT Abdomen and Pelvis 03/13/2013.  Head CT 07/09/2012.  FINDINGS: MR LUMBAR FINDINGS:  Normal lumbar segmentation depicted in 2014. Stable vertebral height and alignment. No marrow edema or evidence of acute osseous abnormality.  Visualized lower thoracic spinal cord is normal with conus medularis at L1. No abnormal intradural enhancement.  Visualized abdominal viscera and paraspinal soft tissues are within normal limits. Trace pelvic free fluid likely is physiologic.  T10-T11:  Partially visible, grossly negative.  T11-T12:   Negative.  T12-L1:  Negative.  L1-L2:  Negative.  L2-L3:  Negative.  L3-L4:  Negative except for mild facet hypertrophy on the left.  L4-L5: Disc desiccation and disc space loss. Right paracentral disc herniation with large round rim enhancing space-occupying mass in the right lateral recess and ventral epidural space tracking posterior to the L5 vertebral body (series 800, images 27-30 and sagittal series 400, image 6). This lesion most resembles an oval sequestered disc fragment measuring 14 x 13 x 20 mm (AP by transverse by CC)  Superimposed mild to moderate facet hypertrophy. Trace facet joint fluid. Severe right lateral recess stenosis at the level of the descending and exiting right L5 nerve. There is also mass effect on the descending right S1 nerve roots. No significant spinal stenosis overall. No L4 foraminal involvement. Mild epidural lipomatosis also noted.  L5-S1: Negative disc. Mild facet hypertrophy. Mild epidural lipomatosis. Abnormal sequestered disc fragment as above. No superimposed L5 foraminal stenosis.  MR BRAIN FINDINGS:  Cerebral volume is normal. No restricted diffusion to suggest acute infarction. No midline shift, mass effect, evidence of mass lesion, ventriculomegaly, extra-axial collection or acute intracranial hemorrhage. Cervicomedullary junction and pituitary are within normal limits. Grossly negative visualized cervical spine. Major intracranial vascular flow voids are preserved.  Nodular periventricular T2 and FLAIR hyperintense white matter lesions, many oriented perpendicular to the lateral ventricles. Some superimposed subcortical white matter involvement. No temporal lobe involvement identified. No lesion shows restricted diffusion. Following contrast There is suggested faint enhancement of a right centrum semiovale shin (series 7, image 18  and series 12, image 18). Mild vascular pulsation artifact also noted on post-contrast images. No definite additional abnormal enhancement.   Deep gray matter nuclei and brainstem are spared. Abnormal T2 and FLAIR hyperintensity in the central left cerebellum, without enhancement.  Visible internal auditory structures appear normal. Visualized paranasal sinuses and mastoids are clear. Grossly normal orbits soft tissues. Visualized bone marrow signal is within normal limits. Visualized scalp soft tissues are within normal limits.  IMPRESSION: 1. L4-L5 disc herniation and 2 cm sequestered disc fragment, severely affecting the course of the descending and exiting right L5 nerve. Descending right S1 nerve roots also affected. No significant spinal stenosis. 2. Signal abnormality in the cerebral and cerebellar white matter most compatible with multiple sclerosis. Small area of acute demyelination suggested in the right centrum semiovale on the basis of faint enhancement.   Electronically Signed   By: Augusto GambleLee  Hall M.D.   On: 04/13/2014 18:38   Mr Lumbar Spine W Wo Contrast  04/13/2014   CLINICAL DATA:  33 year old female with multiple sclerosis. Low back pain radiating to the right lower extremity with weakness. Initial encounter.  EXAM: MRI LUMBAR SPINE WITHOUT AND WITH CONTRAST; MRI HEAD WITHOUT AND WITH CONTRAST  TECHNIQUE: Multiplanar and multiecho pulse sequences of the lumbar spine were obtained without and with intravenous contrast.; Multiplanar, multiecho pulse sequences of the brain and surrounding structures were obtained according to standard protocol without and with intravenous contrast  CONTRAST:  20mL MULTIHANCE GADOBENATE DIMEGLUMINE 529 MG/ML IV SOLN  COMPARISON:  CT Abdomen and Pelvis 03/13/2013.  Head CT 07/09/2012.  FINDINGS: MR LUMBAR FINDINGS:  Normal lumbar segmentation depicted in 2014. Stable vertebral height and alignment. No marrow edema or evidence of acute osseous abnormality.  Visualized lower thoracic spinal cord is normal with conus medularis at L1. No abnormal intradural enhancement.  Visualized abdominal viscera and paraspinal  soft tissues are within normal limits. Trace pelvic free fluid likely is physiologic.  T10-T11:  Partially visible, grossly negative.  T11-T12:  Negative.  T12-L1:  Negative.  L1-L2:  Negative.  L2-L3:  Negative.  L3-L4:  Negative except for mild facet hypertrophy on the left.  L4-L5: Disc desiccation and disc space loss. Right paracentral disc herniation with large round rim enhancing space-occupying mass in the right lateral recess and ventral epidural space tracking posterior to the L5 vertebral body (series 800, images 27-30 and sagittal series 400, image 6). This lesion most resembles an oval sequestered disc fragment measuring 14 x 13 x 20 mm (AP by transverse by CC)  Superimposed mild to moderate facet hypertrophy. Trace facet joint fluid. Severe right lateral recess stenosis at the level of the descending and exiting right L5 nerve. There is also mass effect on the descending right S1 nerve roots. No significant spinal stenosis overall. No L4 foraminal involvement. Mild epidural lipomatosis also noted.  L5-S1: Negative disc. Mild facet hypertrophy. Mild epidural lipomatosis. Abnormal sequestered disc fragment as above. No superimposed L5 foraminal stenosis.  MR BRAIN FINDINGS:  Cerebral volume is normal. No restricted diffusion to suggest acute infarction. No midline shift, mass effect, evidence of mass lesion, ventriculomegaly, extra-axial collection or acute intracranial hemorrhage. Cervicomedullary junction and pituitary are within normal limits. Grossly negative visualized cervical spine. Major intracranial vascular flow voids are preserved.  Nodular periventricular T2 and FLAIR hyperintense white matter lesions, many oriented perpendicular to the lateral ventricles. Some superimposed subcortical white matter involvement. No temporal lobe involvement identified. No lesion shows restricted diffusion. Following contrast There is suggested faint enhancement of  a right centrum semiovale shin (series 7, image  18 and series 12, image 18). Mild vascular pulsation artifact also noted on post-contrast images. No definite additional abnormal enhancement.  Deep gray matter nuclei and brainstem are spared. Abnormal T2 and FLAIR hyperintensity in the central left cerebellum, without enhancement.  Visible internal auditory structures appear normal. Visualized paranasal sinuses and mastoids are clear. Grossly normal orbits soft tissues. Visualized bone marrow signal is within normal limits. Visualized scalp soft tissues are within normal limits.  IMPRESSION: 1. L4-L5 disc herniation and 2 cm sequestered disc fragment, severely affecting the course of the descending and exiting right L5 nerve. Descending right S1 nerve roots also affected. No significant spinal stenosis. 2. Signal abnormality in the cerebral and cerebellar white matter most compatible with multiple sclerosis. Small area of acute demyelination suggested in the right centrum semiovale on the basis of faint enhancement.   Electronically Signed   By: Augusto Gamble M.D.   On: 04/13/2014 18:38    Assessment/Plan: Proceed with microdiscectomy today.    LOS: 1 day    Dorian Heckle, MD 04/14/2014, 8:31 AM

## 2014-04-14 NOTE — Op Note (Signed)
04/13/2014 - 04/14/2014  3:05 PM  PATIENT:  Beth [name]  33 y.o. female  PRE-OPERATIVE DIAGNOSIS:  Lumbar Four-Five Disc Herniation with radiculopathy, stenosis, Degenerative disc disease Right  POST-OPERATIVE DIAGNOSIS:   Lumbar Four-Five Disc Herniation with radiculopathy, stenosis, Degenerative disc disease Right  PROCEDURE:  Procedure(s) with comments: LUMBAR LAMINECTOMY/DECOMPRESSION MICRODISCECTOMY LEVEL L4-5 (Right) - LUMBAR LAMINECTOMY/DECOMPRESSION MICRODISCECTOMY LEVEL L4-5  SURGEON:  Surgeon(s) and Role:    * Joseph Stern, MD - Primary    * Kyle L Cabbell, MD - Assisting  PHYSICIAN ASSISTANT:   ASSISTANTS: none   ANESTHESIA:   general  EBL:  Total I/O In: 700 [I.V.:700] Out: 40 [Blood:40]  BLOOD ADMINISTERED:none  DRAINS: none   LOCAL MEDICATIONS USED:  LIDOCAINE   SPECIMEN:  No Specimen  DISPOSITION OF SPECIMEN:  N/A  COUNTS:  YES  TOURNIQUET:  * No tourniquets in log *  DICTATION: DICTATION: Patient has a large L4-5 disc rupture on the right with significant right leg weakness. It was elected to take her to surgery for right L4-5 microdiscectomy.  Procedure: Patient was brought to the operating room and following the smooth and uncomplicated induction of general endotracheal anesthesia she was placed in a prone position on the Wilson frame. Low back was prepped and draped in the usual sterile fashion with betadine scrub and DuraPrep. Area of planned incision was infiltrated with local lidocaine. Preoperative localizing X ray was obtained with a spinal needle. Incision was made in the midline and carried to the lumbodorsal fascia which was incised on the right side of midline. Subperiosteal dissection was performed exposing what was felt to be L45 level. Intraoperative x-ray demonstrated marker probes at L4-5. A hemi-semi-laminectomy of L4 was performed a high-speed drill and completed with Kerrison rongeurs and a generous foraminotomy was performed overlying the  superior aspect of the L5 lamina. Ligamentum flavum was detached and removed in a piecemeal fashion and the L5 nerve root was decompressed laterally with removal of the superior aspect of the facet and ligamentum causing nerve root compression. The microscope was brought into the field and the L5 nerve root was mobilized medially. This exposed a large amount of soft disc material and a free fragment of herniated disc material within the axilla of the L 5 nerve root. Multiple fragments were removed and the nerve root and thecal sac were decompressed.  At this point it was felt that all neural elements were well decompressed and there was no evidence of residual loose disc material nor herniation from the interspace. The wound was then irrigated with  saline and no additional disc material was mobilized. Hemostasis was assured with bipolar electrocautery and the interspace was irrigated with Depo-Medrol and fentanyl. The lumbodorsal fascia was closed with 0 Vicryl sutures the subcutaneous tissues reapproximated 2-0 Vicryl inverted sutures and the skin edges were reapproximated with 3-0 Vicryl subcuticular stitch. The wound is dressed with Dermabond. Patient was extubated in the operating room and taken to recovery in stable and satisfactory condition having tolerated his operation well counts were correct at the end of the case.  PLAN OF CARE: Admit for overnight observation  PATIENT DISPOSITION:  PACU - hemodynamically stable.   Delay start of Pharmacological VTE agent (>24hrs) due to surgical blood loss or risk of bleeding: yes  

## 2014-04-14 NOTE — Progress Notes (Signed)
Awake, alert, conversant.  Right leg strength improved and pain diminished.  Doing well.

## 2014-04-14 NOTE — Progress Notes (Signed)
PT Cancellation Note  Patient Details Name: Beth [name] MRN: [id number] DOB: 08-21-1981   Cancelled Treatment:    Reason Eval/Treat Not Completed: Patient not medically ready.  Heading back to surgery today.  Will start tomorrow as able. 04/14/2014  [name] [phone]  (pager)   Mottinger, Eliseo Gum 04/14/2014, 11:15 AM

## 2014-04-14 NOTE — Transfer of Care (Signed)
Immediate Anesthesia Transfer of Care Note  Patient: Beth [name]  Procedure(s) Performed: Procedure(s) with comments: LUMBAR LAMINECTOMY/DECOMPRESSION MICRODISCECTOMY LEVEL L4-5 (Right) - LUMBAR LAMINECTOMY/DECOMPRESSION MICRODISCECTOMY LEVEL L4-5  Patient Location: PACU  Anesthesia Type:General  Level of Consciousness: awake, alert , oriented and patient cooperative  Airway & Oxygen Therapy: Patient Spontanous Breathing and Patient connected to nasal cannula oxygen  Post-op Assessment: Report given to PACU RN, Post -op Vital signs reviewed and stable and Patient moving all extremities X 4  Post vital signs: Reviewed and stable  Complications: No apparent anesthesia complications

## 2014-04-14 NOTE — Progress Notes (Signed)
OT Cancellation Note  Patient Details Name: Beth [name] MRN: [id number] DOB: [birth year]   Cancelled Treatment:    Reason Eval/Treat Not Completed: Other (comment). OT made visit this date to assess pt's ADL performance. Pt reports that she is having surgery today. OT will follow up with pt post-surgery to assess ADL performance.    Rae LipsMiller, LeeAnn M [phone] 04/14/2014, 9:23 AM

## 2014-04-14 NOTE — Anesthesia Postprocedure Evaluation (Signed)
  Anesthesia Post-op Note  Patient: Dot LanesKrista A Rodden  Procedure(s) Performed: Procedure(s) with comments: LUMBAR LAMINECTOMY/DECOMPRESSION MICRODISCECTOMY LEVEL L4-5 (Right) - LUMBAR LAMINECTOMY/DECOMPRESSION MICRODISCECTOMY LEVEL L4-5  Patient Location: PACU  Anesthesia Type:General  Level of Consciousness: awake, alert , oriented and patient cooperative  Airway and Oxygen Therapy: Patient Spontanous Breathing  Post-op Pain: moderate  Post-op Assessment: Post-op Vital signs reviewed, Patient's Cardiovascular Status Stable, Respiratory Function Stable, Patent Airway, No signs of Nausea or vomiting and Pain level controlled  Post-op Vital Signs: stable  Last Vitals:  Filed Vitals:   04/14/14 1530  BP: 148/104  Pulse: 81  Temp:   Resp: 14    Complications: No apparent anesthesia complications

## 2014-04-14 NOTE — Clinical Social Work Note (Signed)
CSW consulted for possible SNF placement at time of discharge. CSW now following for discharge disposition. Thank you for the referral.  Marcelline Deist, MSW, [facility] Licensed Clinical Social Worker [phone] and [phone]

## 2014-04-15 MED ORDER — PANTOPRAZOLE SODIUM 40 MG PO TBEC
40.0000 mg | DELAYED_RELEASE_TABLET | Freq: Every day | ORAL | Status: DC
  Administered 2014-04-15: 40 mg via ORAL
  Filled 2014-04-15: qty 1

## 2014-04-15 NOTE — Progress Notes (Signed)
Occupational Therapy Evaluation Patient Details Name: HAELEE [name] MRN: [id number] DOB: [birth year] Today's Date: 04/15/2014    History of Present Illness Pt is 33 y.o. Female s/p lumbar laminectomy, microdiskectomy and decompression L4-L5 on 04/14/14 for disk herniation.    Clinical Impression   PTA pt lived at home with her fiance and was independent with ADLs and IADLs. Pt is moving well this date with no c/o pain. Pt utilizes adapted log roll technique for bed mobility. Issued pt with AE (hip kit) and pt would benefit from skilled OT for AE training and to progress pt to mod Independent level prior to d/c home. Pt's mother is able to be home 24/7 until Monday.     Follow Up Recommendations  No OT follow up;Supervision/Assistance - 24 hour    Equipment Recommendations  Other (comment) (Pt issued AE (hip kit))       Precautions / Restrictions Precautions Precautions: Back;Fall Precaution Booklet Issued: Yes (comment) Precaution Comments: Educated pt with teach back on 3/3 back precautions and incorporating into ADLs.  Restrictions Weight Bearing Restrictions: No      Mobility Bed Mobility Overal bed mobility: Needs Assistance Bed Mobility: Sidelying to Sit   Sidelying to sit: Supervision;HOB elevated       General bed mobility comments: Per chart, neuro MD has allowed pt to perform bed mobility without log roll. Pt completes a version of a log roll from sidelying with HOB elevated and use of bed rails with increased time.   Transfers Overall transfer level: Needs assistance Equipment used: Rolling walker (2 wheeled) Transfers: Sit to/from Stand Sit to Stand: Min guard         General transfer comment: min guard to steady with transfers; incr time due to pain; cues for hand placement and to maintain upright posture     Balance Overall balance assessment: Needs assistance Sitting-balance support: No upper extremity supported;Feet supported Sitting balance-Leahy  Scale: Fair     Standing balance support: Bilateral upper extremity supported;During functional activity Standing balance-Leahy Scale: Poor Standing balance comment: Pt able to remove one UE from RW to open door without LOB. Pt commented on trying to put more weight through her LEs than her UEs.                             ADL Overall ADL's : Needs assistance/impaired Eating/Feeding: Independent;Sitting   Grooming: Wash/dry hands;Min guard;Standing   Upper Body Bathing: Set up;Supervision/ safety;Sitting   Lower Body Bathing: Minimal assistance;Sit to/from stand;Adhering to back precautions   Upper Body Dressing : Supervision/safety;Set up;Sitting   Lower Body Dressing: Moderate assistance;Sit to/from stand;Adhering to back precautions   Toilet Transfer: Min guard;Ambulation;RW (sit<>stand)   Toileting- Clothing Manipulation and Hygiene: Minimal assistance;Sit to/from stand;Adhering to back precautions       Functional mobility during ADLs: Min guard;Rolling walker General ADL Comments: Pt nervous abuot breaking back precautions, however moving well this afternoon. Pt ambulated in hallway then returned to chair and positioned for comfort. Pt reports pain relief when walking and sitting in recliner. Educated pt on fall prevention, energy conservation, and incorporating back precautions into ADLs. Issued hip kit and will train AE at a later time.      Vision  Pt reports no change from baseline.  No apparent visual deficits.                 Perception Perception Perception Tested?: No   Praxis Praxis Praxis tested?: Within  functional limits    Pertinent Vitals/Pain No c/o pain, pt reports relief when standing/walking and sitting in recliner. Pt is fearful/anxious of movement however does well.      Hand Dominance Right   Extremity/Trunk Assessment Upper Extremity Assessment Upper Extremity Assessment: Overall WFL for tasks assessed   Lower Extremity  Assessment Lower Extremity Assessment: Defer to PT evaluation   Cervical / Trunk Assessment Cervical / Trunk Assessment: Normal   Communication Communication Communication: No difficulties   Cognition Arousal/Alertness: Awake/alert Behavior During Therapy: Anxious Overall Cognitive Status: Within Functional Limits for tasks assessed                                Home Living Family/patient expects to be discharged to:: Private residence Living Arrangements: Parent Available Help at Discharge: Family;Available 24 hours/day (mom will be with her until Monday) Type of Home: House Home Access: Level entry     Home Layout: One level     Bathroom Shower/Tub: Teacher, early years/pre: Handicapped height     Home Equipment: Environmental consultant - 2 wheels   Additional Comments: Pt's fiance works 3rd shift. Pt plans to stay with mother, and mother will be with her 24/7 until monday. Pt needs to be as independent as possible.       Prior Functioning/Environment Level of Independence: Independent             OT Diagnosis: Generalized weakness;Acute pain   OT Problem List: Decreased strength;Decreased range of motion;Decreased activity tolerance;Impaired balance (sitting and/or standing);Decreased safety awareness;Decreased knowledge of use of DME or AE;Decreased knowledge of precautions;Pain   OT Treatment/Interventions: Self-care/ADL training;Energy conservation;Therapeutic exercise;DME and/or AE instruction;Therapeutic activities;Patient/family education;Balance training    OT Goals(Current goals can be found in the care plan section) Acute Rehab OT Goals Patient Stated Goal: to be independent to go home OT Goal Formulation: With patient Time For Goal Achievement: 04/22/14 Potential to Achieve Goals: Good ADL Goals Pt Will Perform Grooming: with modified independence;standing Pt Will Perform Lower Body Bathing: with modified independence;with adaptive  equipment;sit to/from stand Pt Will Perform Lower Body Dressing: with modified independence;with adaptive equipment;sit to/from stand Pt Will Transfer to Toilet: with modified independence;ambulating;regular height toilet Pt Will Perform Toileting - Clothing Manipulation and hygiene: with modified independence;sit to/from stand;with adaptive equipment Pt Will Perform Tub/Shower Transfer: Tub transfer;with supervision;ambulating;rolling walker Additional ADL Goal #1: Pt will perform bed mobility while maintaining back precautions with Mod Independence to prepare for ADLs.   OT Frequency: Min 2X/week   Barriers to D/C: Decreased caregiver support             End of Session Equipment Utilized During Treatment: Gait belt;Rolling walker Nurse Communication: Other (comment) (pt in chair and mobilized with OT)  Activity Tolerance: Patient tolerated treatment well Patient left: in chair;with call bell/phone within reach   Time: 8325-4982 OT Time Calculation (min): 37 min Charges:  OT General Charges $OT Visit: 1 Procedure OT Evaluation $Initial OT Evaluation Tier I: 1 Procedure OT Treatments $Self Care/Home Management : 23-37 mins  Juluis Rainier  [phone]  04/15/2014, 6:21 PM

## 2014-04-15 NOTE — Progress Notes (Signed)
Subjective: Patient reports leg pain resolved.  Complaining of incisional/ low back pain  Objective: Vital signs in last 24 hours: Temp:  [96.8 F (36 C)-98.8 F (37.1 C)] 97.7 F (36.5 C) (06/18 0934) Pulse Rate:  [77-91] 79 (06/18 0934) Resp:  [12-20] 20 (06/18 0934) BP: (92-155)/(61-104) 116/66 mmHg (06/18 0934) SpO2:  [95 %-100 %] 100 % (06/18 0934)  Intake/Output from previous day: 06/17 0701 - 06/18 0700 In: 1300 [P.O.:600; I.V.:700] Out: 40 [Blood:40] Intake/Output this shift: Total I/O In: 480 [P.O.:480] Out: -   Physical Exam: Strength full both legs DF/EHL.  Numbness improved.  Incision CDI  Lab Results:  Recent Labs  04/13/14 1339  WBC 7.1  HGB 9.5*  HCT 32.3*  PLT 534*   BMET  Recent Labs  04/13/14 1339  NA 139  K 4.6  CL 101  CO2 23  GLUCOSE 87  BUN 4*  CREATININE 0.61  CALCIUM 9.0    Studies/Results: Dg Lumbar Spine 2-3 Views  04/14/2014   CLINICAL DATA:  L4-5 laminectomy  EXAM: LUMBAR SPINE - 2-3 VIEW  COMPARISON:  04/13/2014  FINDINGS: Lateral radiograph of the lumbar spine reveals a needle in the posterior soft tissues just above the L4-5 interspace. Second film shows surgical retractor and instruments posterior to the L4-5 interspace.   Electronically Signed   By: Alcide Clever M.D.   On: 04/14/2014 15:56   Mr Laqueta Jean ZO Contrast  04/13/2014   CLINICAL DATA:  33 year old female with multiple sclerosis. Low back pain radiating to the right lower extremity with weakness. Initial encounter.  EXAM: MRI LUMBAR SPINE WITHOUT AND WITH CONTRAST; MRI HEAD WITHOUT AND WITH CONTRAST  TECHNIQUE: Multiplanar and multiecho pulse sequences of the lumbar spine were obtained without and with intravenous contrast.; Multiplanar, multiecho pulse sequences of the brain and surrounding structures were obtained according to standard protocol without and with intravenous contrast  CONTRAST:  20mL MULTIHANCE GADOBENATE DIMEGLUMINE 529 MG/ML IV SOLN  COMPARISON:  CT  Abdomen and Pelvis 03/13/2013.  Head CT 07/09/2012.  FINDINGS: MR LUMBAR FINDINGS:  Normal lumbar segmentation depicted in 2014. Stable vertebral height and alignment. No marrow edema or evidence of acute osseous abnormality.  Visualized lower thoracic spinal cord is normal with conus medularis at L1. No abnormal intradural enhancement.  Visualized abdominal viscera and paraspinal soft tissues are within normal limits. Trace pelvic free fluid likely is physiologic.  T10-T11:  Partially visible, grossly negative.  T11-T12:  Negative.  T12-L1:  Negative.  L1-L2:  Negative.  L2-L3:  Negative.  L3-L4:  Negative except for mild facet hypertrophy on the left.  L4-L5: Disc desiccation and disc space loss. Right paracentral disc herniation with large round rim enhancing space-occupying mass in the right lateral recess and ventral epidural space tracking posterior to the L5 vertebral body (series 800, images 27-30 and sagittal series 400, image 6). This lesion most resembles an oval sequestered disc fragment measuring 14 x 13 x 20 mm (AP by transverse by CC)  Superimposed mild to moderate facet hypertrophy. Trace facet joint fluid. Severe right lateral recess stenosis at the level of the descending and exiting right L5 nerve. There is also mass effect on the descending right S1 nerve roots. No significant spinal stenosis overall. No L4 foraminal involvement. Mild epidural lipomatosis also noted.  L5-S1: Negative disc. Mild facet hypertrophy. Mild epidural lipomatosis. Abnormal sequestered disc fragment as above. No superimposed L5 foraminal stenosis.  MR BRAIN FINDINGS:  Cerebral volume is normal. No restricted diffusion to suggest acute  infarction. No midline shift, mass effect, evidence of mass lesion, ventriculomegaly, extra-axial collection or acute intracranial hemorrhage. Cervicomedullary junction and pituitary are within normal limits. Grossly negative visualized cervical spine. Major intracranial vascular flow voids  are preserved.  Nodular periventricular T2 and FLAIR hyperintense white matter lesions, many oriented perpendicular to the lateral ventricles. Some superimposed subcortical white matter involvement. No temporal lobe involvement identified. No lesion shows restricted diffusion. Following contrast There is suggested faint enhancement of a right centrum semiovale shin (series 7, image 18 and series 12, image 18). Mild vascular pulsation artifact also noted on post-contrast images. No definite additional abnormal enhancement.  Deep gray matter nuclei and brainstem are spared. Abnormal T2 and FLAIR hyperintensity in the central left cerebellum, without enhancement.  Visible internal auditory structures appear normal. Visualized paranasal sinuses and mastoids are clear. Grossly normal orbits soft tissues. Visualized bone marrow signal is within normal limits. Visualized scalp soft tissues are within normal limits.  IMPRESSION: 1. L4-L5 disc herniation and 2 cm sequestered disc fragment, severely affecting the course of the descending and exiting right L5 nerve. Descending right S1 nerve roots also affected. No significant spinal stenosis. 2. Signal abnormality in the cerebral and cerebellar white matter most compatible with multiple sclerosis. Small area of acute demyelination suggested in the right centrum semiovale on the basis of faint enhancement.   Electronically Signed   By: Augusto Gamble M.D.   On: 04/13/2014 18:38   Mr Lumbar Spine W Wo Contrast  04/13/2014   CLINICAL DATA:  33 year old female with multiple sclerosis. Low back pain radiating to the right lower extremity with weakness. Initial encounter.  EXAM: MRI LUMBAR SPINE WITHOUT AND WITH CONTRAST; MRI HEAD WITHOUT AND WITH CONTRAST  TECHNIQUE: Multiplanar and multiecho pulse sequences of the lumbar spine were obtained without and with intravenous contrast.; Multiplanar, multiecho pulse sequences of the brain and surrounding structures were obtained according  to standard protocol without and with intravenous contrast  CONTRAST:  20mL MULTIHANCE GADOBENATE DIMEGLUMINE 529 MG/ML IV SOLN  COMPARISON:  CT Abdomen and Pelvis 03/13/2013.  Head CT 07/09/2012.  FINDINGS: MR LUMBAR FINDINGS:  Normal lumbar segmentation depicted in 2014. Stable vertebral height and alignment. No marrow edema or evidence of acute osseous abnormality.  Visualized lower thoracic spinal cord is normal with conus medularis at L1. No abnormal intradural enhancement.  Visualized abdominal viscera and paraspinal soft tissues are within normal limits. Trace pelvic free fluid likely is physiologic.  T10-T11:  Partially visible, grossly negative.  T11-T12:  Negative.  T12-L1:  Negative.  L1-L2:  Negative.  L2-L3:  Negative.  L3-L4:  Negative except for mild facet hypertrophy on the left.  L4-L5: Disc desiccation and disc space loss. Right paracentral disc herniation with large round rim enhancing space-occupying mass in the right lateral recess and ventral epidural space tracking posterior to the L5 vertebral body (series 800, images 27-30 and sagittal series 400, image 6). This lesion most resembles an oval sequestered disc fragment measuring 14 x 13 x 20 mm (AP by transverse by CC)  Superimposed mild to moderate facet hypertrophy. Trace facet joint fluid. Severe right lateral recess stenosis at the level of the descending and exiting right L5 nerve. There is also mass effect on the descending right S1 nerve roots. No significant spinal stenosis overall. No L4 foraminal involvement. Mild epidural lipomatosis also noted.  L5-S1: Negative disc. Mild facet hypertrophy. Mild epidural lipomatosis. Abnormal sequestered disc fragment as above. No superimposed L5 foraminal stenosis.  MR BRAIN FINDINGS:  Cerebral  volume is normal. No restricted diffusion to suggest acute infarction. No midline shift, mass effect, evidence of mass lesion, ventriculomegaly, extra-axial collection or acute intracranial hemorrhage.  Cervicomedullary junction and pituitary are within normal limits. Grossly negative visualized cervical spine. Major intracranial vascular flow voids are preserved.  Nodular periventricular T2 and FLAIR hyperintense white matter lesions, many oriented perpendicular to the lateral ventricles. Some superimposed subcortical white matter involvement. No temporal lobe involvement identified. No lesion shows restricted diffusion. Following contrast There is suggested faint enhancement of a right centrum semiovale shin (series 7, image 18 and series 12, image 18). Mild vascular pulsation artifact also noted on post-contrast images. No definite additional abnormal enhancement.  Deep gray matter nuclei and brainstem are spared. Abnormal T2 and FLAIR hyperintensity in the central left cerebellum, without enhancement.  Visible internal auditory structures appear normal. Visualized paranasal sinuses and mastoids are clear. Grossly normal orbits soft tissues. Visualized bone marrow signal is within normal limits. Visualized scalp soft tissues are within normal limits.  IMPRESSION: 1. L4-L5 disc herniation and 2 cm sequestered disc fragment, severely affecting the course of the descending and exiting right L5 nerve. Descending right S1 nerve roots also affected. No significant spinal stenosis. 2. Signal abnormality in the cerebral and cerebellar white matter most compatible with multiple sclerosis. Small area of acute demyelination suggested in the right centrum semiovale on the basis of faint enhancement.   Electronically Signed   By: Augusto GambleLee  Hall M.D.   On: 04/13/2014 18:38    Assessment/Plan: Mobilizing slowly with PT.  Improving.      LOS: 2 days    Dorian HeckleSTERN,JOSEPH D, MD 04/15/2014, 11:39 AM

## 2014-04-15 NOTE — Evaluation (Signed)
Physical Therapy Evaluation Patient Details Name: Beth [name] MRN: [id number] DOB: 12-24-1980 Today's Date: 04/15/2014   History of Present Illness  33 y.o. female who complains of severe right leg pain and numbness. Onset of symptoms was 1 month ago, gradually worsening since that time. MRI showed a large right free fragment disc herniation at L 45 with caudally migrated free fragment. Pt is s/p lumbar laminectomy, microdiskectomy and decompression L4-L5.   Clinical Impression  Patient is s/p surgery listed above resulting in the deficits listed below (see PT Problem List). Patient will benefit from skilled PT to increase their independence and safety with mobility (while adhering to their precautions) to allow discharge home with family. Pt tearful and in 8/10 pain at end of session. RN notified.     Follow Up Recommendations Outpatient PT;Supervision/Assistance - 24 hour;Other (comment) (OPPT when appropriate per MD)    Equipment Recommendations  Rolling walker with 5" wheels    Recommendations for Other Services OT consult     Precautions / Restrictions Precautions Precautions: Back;Fall Precaution Comments: pt educated on back precautions; plan to give handout next session Restrictions Weight Bearing Restrictions: No      Mobility  Bed Mobility Overal bed mobility: Needs Assistance Bed Mobility: Rolling;Sidelying to Sit;Sit to Sidelying Rolling: Min assist Sidelying to sit: Min assist     Sit to sidelying: Min assist General bed mobility comments: (A) and cues for log rolling technique; pt c/o incr pain and requires incr time and handrails for bed mobility  Transfers Overall transfer level: Needs assistance Equipment used: Rolling walker (2 wheeled) Transfers: Sit to/from Stand Sit to Stand: Min guard         General transfer comment: min guard to steady with transfers; incr time due to pain; cues for hand placement and to maintain upright posture    Ambulation/Gait Ambulation/Gait assistance: Min guard Ambulation Distance (Feet): 80 Feet Assistive device: Rolling walker (2 wheeled) Gait Pattern/deviations: Step-to pattern;Decreased stance time - right;Decreased stride length;Wide base of support;Trunk flexed Gait velocity: very decreased due to pain Gait velocity interpretation: Below normal speed for age/gender General Gait Details: cues for upright posture and gt sequencing; pt with decreased stance time on Rt LE due to pain and weakness; limited in distance due to fatigue and pain  Stairs            Wheelchair Mobility    Modified Rankin (Stroke Patients Only)       Balance Overall balance assessment: Needs assistance Sitting-balance support: Feet supported;No upper extremity supported Sitting balance-Leahy Scale: Fair     Standing balance support: During functional activity;Bilateral upper extremity supported Standing balance-Leahy Scale: Poor Standing balance comment: relies heavily on RW for bil UE support                             Pertinent Vitals/Pain 8/10 at end of session; RN notified.    Home Living Family/patient expects to be discharged to:: Private residence Living Arrangements: Parent Available Help at Discharge: Family;Available PRN/intermittently Type of Home: House Home Access: Level entry     Home Layout: One level Home Equipment: Walker - 2 wheels Additional Comments: elevated toilet seat at mothers. plans to D/C with mother since husband works 3rd shift. has tub shower at mother's    Prior Function Level of Independence: Independent               Hand Dominance   Dominant Hand: Right  Extremity/Trunk Assessment   Upper Extremity Assessment: Defer to OT evaluation           Lower Extremity Assessment: RLE deficits/detail RLE Deficits / Details: ankle 3+/5; quad 3+/5    Cervical / Trunk Assessment: Normal  Communication   Communication: No  difficulties  Cognition Arousal/Alertness: Awake/alert Behavior During Therapy: Anxious Overall Cognitive Status: Within Functional Limits for tasks assessed       Memory: Decreased recall of precautions              General Comments      Exercises General Exercises - Lower Extremity Ankle Circles/Pumps: AROM;Strengthening;Both;10 reps;Supine      Assessment/Plan    PT Assessment Patient needs continued PT services  PT Diagnosis Abnormality of gait;Generalized weakness;Acute pain   PT Problem List Decreased strength;Decreased activity tolerance;Decreased balance;Decreased mobility;Decreased knowledge of use of DME;Decreased knowledge of precautions;Pain  PT Treatment Interventions DME instruction;Gait training;Functional mobility training;Therapeutic activities;Therapeutic exercise;Neuromuscular re-education;Balance training;Patient/family education   PT Goals (Current goals can be found in the Care Plan section) Acute Rehab PT Goals Patient Stated Goal: to not have pain  PT Goal Formulation: With patient Time For Goal Achievement: 04/22/14 Potential to Achieve Goals: Good    Frequency Min 5X/week   Barriers to discharge        Co-evaluation               End of Session Equipment Utilized During Treatment: Gait belt Activity Tolerance: Patient limited by fatigue;Patient limited by pain Patient left: in bed;with call bell/phone within reach;with family/visitor present Nurse Communication: Mobility status;Patient requests pain meds;Precautions         Time: 0569-7948 PT Time Calculation (min): 22 min   Charges:   PT Evaluation $Initial PT Evaluation Tier I: 1 Procedure PT Treatments $Gait Training: 8-22 mins   PT G CodesDonell Sievert, [place]  [phone] 04/15/2014, 12:09 PM

## 2014-04-15 NOTE — Progress Notes (Signed)
CARE MANAGEMENT NOTE 04/15/2014  Patient:  Beth [name],Beth [name]   Account Number:  [id number]  Date Initiated:  04/15/2014  Documentation initiated by:  Jiles CrockerHANDLER,BRENDA  Subjective/Objective Assessment:   ADMITTED FOR SURGERY     Action/Plan:   CM FOLLOWING FOR DCP   Anticipated DC Date:  04/18/2014   Anticipated DC Plan:  PHYSICAL THERAPY RECOMMENDS OUTPATIENT THERAPY; CM CAN ARRANGE WITH ATTENDING MD IN AGREEMENT FOR SERVICES     DC Planning Services  CM consult          Status of service:  In process, will continue to follow Medicare Important Message given?   (If response is "NO", the following Medicare IM given date fields will be blank)  Per UR Regulation:  Reviewed for med. necessity/level of care/duration of stay  Comments:  6/18/2015Abelino Derrick- B CHANDLER RN,BSN,MHA [phone]

## 2014-04-15 NOTE — Clinical Social Work Note (Signed)
Per chart review, PT has recommended outpatient PT at time of discharge. CSW signing off. Thank you for the referral.  Marcelline Deist, MSW, [facility] Licensed Clinical Social Worker [phone] and [phone]

## 2014-04-16 MED ORDER — OXYCODONE-ACETAMINOPHEN 5-325 MG PO TABS: 1.0000 | ORAL_TABLET | ORAL | Status: DC | PRN

## 2014-04-16 MED ORDER — DIAZEPAM 5 MG PO TABS: 5.0000 mg | ORAL_TABLET | Freq: Four times a day (QID) | ORAL | Status: DC | PRN

## 2014-04-16 NOTE — Progress Notes (Signed)
Discharge information reviewed with patient/family. All questions answered at this time. RXs given to patient/family. Patient requests to leave this afternoon.  Sim Boast, RN

## 2014-04-16 NOTE — Progress Notes (Signed)
Subjective: Patient reports still some back pain, leg better  Objective: Vital signs in last 24 hours: Temp:  [97.8 F (36.6 C)-98.5 F (36.9 C)] 97.9 F (36.6 C) (06/19 0938) Pulse Rate:  [57-74] 65 (06/19 0938) Resp:  [18-20] 18 (06/19 0938) BP: (82-121)/(51-74) 111/74 mmHg (06/19 0938) SpO2:  [96 %-100 %] 100 % (06/19 0938)  Intake/Output from previous day: 06/18 0701 - 06/19 0700 In: 480 [P.O.:480] Out: -  Intake/Output this shift: Total I/O In: 240 [P.O.:240] Out: -   Physical Exam: Full strength, dressing CDI  Lab Results:  Recent Labs  04/13/14 1339  WBC 7.1  HGB 9.5*  HCT 32.3*  PLT 534*   BMET  Recent Labs  04/13/14 1339  NA 139  K 4.6  CL 101  CO2 23  GLUCOSE 87  BUN 4*  CREATININE 0.61  CALCIUM 9.0    Studies/Results: Dg Lumbar Spine 2-3 Views  04/14/2014   CLINICAL DATA:  L4-5 laminectomy  EXAM: LUMBAR SPINE - 2-3 VIEW  COMPARISON:  04/13/2014  FINDINGS: Lateral radiograph of the lumbar spine reveals a needle in the posterior soft tissues just above the L4-5 interspace. Second film shows surgical retractor and instruments posterior to the L4-5 interspace.   Electronically Signed   By: Alcide CleverMark  Lukens M.D.   On: 04/14/2014 15:56    Assessment/Plan: D/C home    LOS: 3 days    STERN,JOSEPH D, MD 04/16/2014, 10:46 AM

## 2014-04-16 NOTE — Progress Notes (Signed)
Occupational Therapy Treatment Patient Details Name: Beth [name] MRN: [id number] DOB: 1981/05/12 Today's Date: 04/16/2014    History of present illness Pt is 33 y.o. Female s/p lumbar laminectomy, microdiskectomy and decompression L4-L5 on 04/14/14 for disk herniation.    OT comments  Pt seen today for ADLs and functional mobility. Demonstrated AE hip kit with return demonstration for LB ADLs and pt overall at [place] level for ADLs with use of AE. Reinforced back precautions with pt/family. Pt has met OT goals. Acute OT to sign off.    Follow Up Recommendations  No OT follow up;Supervision/Assistance - 24 hour    Equipment Recommendations  None recommended by OT       Precautions / Restrictions Precautions Precautions: Back Precaution Comments: pt able to independently recall 3/3 back precautions. Pt fearful of breaking back precautions, however maintained during ADL session without cueing. Restrictions Weight Bearing Restrictions: No       Mobility Bed Mobility               General bed mobility comments: Pt sitting EOB upon OT arrival. Discussed method for getting in/out of flat bed at home and pt feels comfortable.   Transfers Overall transfer level: Modified independent                        ADL Overall ADL's : Modified independent                                       General ADL Comments: Pt at mod independent level for ADLs with use of AE hip kit. Demonstrated with return demonstration by pt of use of reacher, sock aid, and long handled shoe horn. Pt expressed more confidence regarding going home after  ADL session.                 Cognition  Arousal/Alertness: Awake/Alert Behavior During Therapy: WFL for tasks assessed/performed; Anxious Overall Cognitive Status: Within Functional Limits for tasks assessed                                   Pertinent Vitals/ Pain       No c/o pain             Progress Toward Goals  OT Goals(current goals can now be found in the care plan section)  Progress towards OT goals: Goals met/education completed, patient discharged from [facility] All goals met and education completed, patient discharged from [facility] During Treatment: Other (comment) (AE (hip kit))   Activity Tolerance Patient tolerated treatment well   Patient Left Other (comment);with call bell/phone within reach;with family/visitor present (sitting EOB eating lunch)   Nurse Communication Other (comment) (pt dressed and ready for D/C. )        Time: 6553-7482 OT Time Calculation (min): 34 min  Charges: OT General Charges $OT Visit: 1 Procedure OT Treatments $Self Care/Home Management : 23-37 mins  Juluis Rainier [phone] 04/16/2014, 1:52 PM

## 2014-04-16 NOTE — ED Provider Notes (Signed)
Medical screening examination/treatment/procedure(s) were performed by non-physician practitioner and as supervising physician I was immediately available for consultation/collaboration.   EKG Interpretation   Date/Time:  Tuesday April 13 2014 11:40:32 EDT Ventricular Rate:  82 PR Interval:  148 QRS Duration: 81 QT Interval:  406 QTC Calculation: 474 R Axis:   71 Text Interpretation:  Sinus rhythm Normal ECG No old tracing to compare  Confirmed by GOLDSTON  MD, SCOTT ([id number]) on 04/13/2014 1:12:05 PM        Audree Camel, MD 04/16/14 [phone]

## 2014-04-16 NOTE — Discharge Summary (Signed)
Physician Discharge Summary  Patient ID: Beth [name] MRN: [id number] DOB/AGE: 1981/07/28 32 y.o.  Admit date: 04/13/2014 Discharge date: 04/16/2014  Admission Diagnoses:Herniated lumbar disc with spondylosis, ddd, morbid obesity, ms, radiculopathy Right L5  Discharge Diagnoses: Herniated lumbar disc with spondylosis, ddd, morbid obesity, ms, radiculopathy Right L5 Active Problems:   Herniated lumbar disc without myelopathy   Discharged Condition: good  Hospital Course: Patient admitted through ER, unable to stand or walk.  Underwent Right L 45 microdiscectomy and gradually mobilized postoperatively  Consults: None  Significant Diagnostic Studies: MRI  Treatments: surgery: Right L 45 microdiscectomy   Discharge Exam: Blood pressure 111/74, pulse 65, temperature 97.9 F (36.6 C), temperature source Oral, resp. rate 18, height 5\' 6"  (1.676 m), weight 117.209 kg (258 lb 6.4 oz), last menstrual period 04/11/2014, SpO2 100.00%. Neurologic: Alert and oriented X 3, normal strength and tone. Normal symmetric reflexes. Normal coordination and gait Wound:CDI  Disposition: Home     Medication List         clonazePAM 0.5 MG tablet  Commonly known as:  KLONOPIN  Take 0.5 mg by mouth 2 (two) times daily as needed for anxiety.     diazepam 5 MG tablet  Commonly known as:  VALIUM  Take 1 tablet (5 mg total) by mouth every 6 (six) hours as needed for muscle spasms.     HYDROcodone-acetaminophen 7.5-325 MG per tablet  Commonly known as:  NORCO  Take 1 tablet by mouth every 6 (six) hours as needed for moderate pain.     oxyCODONE-acetaminophen 5-325 MG per tablet  Commonly known as:  PERCOCET/ROXICET  Take 1-2 tablets by mouth every 4 (four) hours as needed for moderate pain.         Signed: Dorian Heckle, MD 04/16/2014, 10:47 AM

## 2014-04-16 NOTE — Progress Notes (Signed)
Physical Therapy Treatment Patient Details Name: Hartford PoliKrista A Pirro MRN: [id number] DOB: 1981-04-13 Today's Date: 04/16/2014    History of Present Illness Pt is 33 y.o. Female s/p lumbar laminectomy, microdiskectomy and decompression L4-L5 on 04/14/14 for disk herniation.     PT Comments    Pt progressing well with mobility today. Pain appears to be controlled. Pt able to ambulate without AD. Encouraged to continue ambulating as tolerated around unit. Pt safe from mobility standpoint to D/C home with family when medically ready. Will cont to follow per POC.   Follow Up Recommendations  Other (comment) (OPPT for core exercises as MD allows)     Equipment Recommendations  None recommended by PT    Recommendations for Other Services OT consult     Precautions / Restrictions Precautions Precautions: Back Precaution Comments: pt able to independently recall 3/3 back precautions; cues through session to adhere with activity Restrictions Weight Bearing Restrictions: No    Mobility  Bed Mobility               General bed mobility comments: not addressed; pt up sitting EOB and returned to chair to sit up   Transfers Overall transfer level: Needs assistance Equipment used: Rolling walker (2 wheeled) Transfers: Sit to/from Stand Sit to Stand: Supervision         General transfer comment: no LOB noted; supervision for safety and min cues to adhere to back precautions  Ambulation/Gait Ambulation/Gait assistance: Supervision Ambulation Distance (Feet): 450 Feet Assistive device: Rolling walker (2 wheeled);None Gait Pattern/deviations: Step-through pattern;Decreased stride length;Narrow base of support Gait velocity: decreased vs baseline due to pain Gait velocity interpretation: Below normal speed for age/gender General Gait Details: pt initially began ambulating with RW; was able to progres to ambulation without AD; cues to avoid twisting with directional changes; no LOB noted;  encouraged to continue ambulation as tolerated with family or nursing today   Stairs            Wheelchair Mobility    Modified Rankin (Stroke Patients Only)       Balance           Standing balance support: During functional activity;No upper extremity supported Standing balance-Leahy Scale: Good Standing balance comment: stood without UE support at sink for ADLs; no LOB noted                    Cognition Arousal/Alertness: Awake/alert Behavior During Therapy: WFL for tasks assessed/performed Overall Cognitive Status: Within Functional Limits for tasks assessed                      Exercises      General Comments        Pertinent Vitals/Pain 6/10 pain. Pt premedicated by RN    Home Living                      Prior Function            PT Goals (current goals can now be found in the care plan section) Acute Rehab PT Goals Patient Stated Goal: to move around PT Goal Formulation: With patient Time For Goal Achievement: 04/22/14 Potential to Achieve Goals: Good Progress towards PT goals: Progressing toward goals    Frequency  Min 5X/week    PT Plan Current plan remains appropriate    Co-evaluation             End of Session Equipment Utilized During Treatment: Gait belt  Activity Tolerance: Patient tolerated treatment well Patient left: in chair;with call bell/phone within reach;with family/visitor present     Time: 4098-1191 PT Time Calculation (min): 15 min  Charges:  $Gait Training: 8-22 mins                    G Codes:      Donnamarie Poag [place] ,[place]err  [phone] 04/16/2014, 9:18 AM

## 2014-04-18 ENCOUNTER — Encounter (HOSPITAL_COMMUNITY): Payer: Self-pay | Admitting: Neurosurgery

## 2014-05-25 ENCOUNTER — Encounter (HOSPITAL_COMMUNITY): Payer: Self-pay | Admitting: Emergency Medicine

## 2014-05-25 ENCOUNTER — Emergency Department (HOSPITAL_COMMUNITY)
Admission: EM | Admit: 2014-05-25 | Discharge: 2014-05-25 | Disposition: A | Discharge status: Other Acute Inpatient Hospital | Payer: BC Managed Care – PPO | Attending: Emergency Medicine | Admitting: Emergency Medicine

## 2014-05-25 ENCOUNTER — Encounter (HOSPITAL_COMMUNITY): Payer: Self-pay

## 2014-05-25 ENCOUNTER — Observation Stay (HOSPITAL_COMMUNITY)
Admission: EM | Admit: 2014-05-25 | Discharge: 2014-05-26 | Disposition: A | Discharge status: Home / Self Care | Payer: BC Managed Care – PPO | Source: Intra-hospital | Attending: Psychiatry | Admitting: Psychiatry

## 2014-05-25 DIAGNOSIS — F332 Major depressive disorder, recurrent severe without psychotic features: Secondary | ICD-10-CM | POA: Insufficient documentation

## 2014-05-25 DIAGNOSIS — R259 Unspecified abnormal involuntary movements: Secondary | ICD-10-CM | POA: Insufficient documentation

## 2014-05-25 DIAGNOSIS — F1020 Alcohol dependence, uncomplicated: Principal | ICD-10-CM | POA: Insufficient documentation

## 2014-05-25 DIAGNOSIS — F17200 Nicotine dependence, unspecified, uncomplicated: Secondary | ICD-10-CM | POA: Insufficient documentation

## 2014-05-25 DIAGNOSIS — F1094 Alcohol use, unspecified with alcohol-induced mood disorder: Secondary | ICD-10-CM | POA: Diagnosis present

## 2014-05-25 DIAGNOSIS — Z8719 Personal history of other diseases of the digestive system: Secondary | ICD-10-CM | POA: Insufficient documentation

## 2014-05-25 DIAGNOSIS — F1210 Cannabis abuse, uncomplicated: Secondary | ICD-10-CM | POA: Insufficient documentation

## 2014-05-25 DIAGNOSIS — F1994 Other psychoactive substance use, unspecified with psychoactive substance-induced mood disorder: Secondary | ICD-10-CM | POA: Insufficient documentation

## 2014-05-25 DIAGNOSIS — G8929 Other chronic pain: Secondary | ICD-10-CM | POA: Insufficient documentation

## 2014-05-25 DIAGNOSIS — Z008 Encounter for other general examination: Secondary | ICD-10-CM | POA: Insufficient documentation

## 2014-05-25 DIAGNOSIS — R45851 Suicidal ideations: Secondary | ICD-10-CM | POA: Insufficient documentation

## 2014-05-25 DIAGNOSIS — Z9884 Bariatric surgery status: Secondary | ICD-10-CM | POA: Insufficient documentation

## 2014-05-25 DIAGNOSIS — Z8669 Personal history of other diseases of the nervous system and sense organs: Secondary | ICD-10-CM | POA: Insufficient documentation

## 2014-05-25 DIAGNOSIS — Z6379 Other stressful life events affecting family and household: Secondary | ICD-10-CM | POA: Insufficient documentation

## 2014-05-25 DIAGNOSIS — Z8679 Personal history of other diseases of the circulatory system: Secondary | ICD-10-CM | POA: Insufficient documentation

## 2014-05-25 DIAGNOSIS — F1010 Alcohol abuse, uncomplicated: Secondary | ICD-10-CM

## 2014-05-25 DIAGNOSIS — F411 Generalized anxiety disorder: Secondary | ICD-10-CM | POA: Insufficient documentation

## 2014-05-25 DIAGNOSIS — G35 Multiple sclerosis: Secondary | ICD-10-CM | POA: Insufficient documentation

## 2014-05-25 DIAGNOSIS — J45909 Unspecified asthma, uncomplicated: Secondary | ICD-10-CM | POA: Insufficient documentation

## 2014-05-25 DIAGNOSIS — Z3202 Encounter for pregnancy test, result negative: Secondary | ICD-10-CM | POA: Insufficient documentation

## 2014-05-25 LAB — CBC WITH DIFFERENTIAL/PLATELET
BASOS ABS: 0.1 10*3/uL (ref 0.0–0.1)
Basophils Relative: 1 % (ref 0–1)
EOS ABS: 0.1 10*3/uL (ref 0.0–0.7)
Eosinophils Relative: 1 % (ref 0–5)
HCT: 33.4 % — ABNORMAL LOW (ref 36.0–46.0)
Hemoglobin: 9.9 g/dL — ABNORMAL LOW (ref 12.0–15.0)
Lymphocytes Relative: 27 % (ref 12–46)
Lymphs Abs: 1.5 10*3/uL (ref 0.7–4.0)
MCH: 21.2 pg — ABNORMAL LOW (ref 26.0–34.0)
MCHC: 29.6 g/dL — AB (ref 30.0–36.0)
MCV: 71.5 fL — ABNORMAL LOW (ref 78.0–100.0)
Monocytes Absolute: 0.5 10*3/uL (ref 0.1–1.0)
Monocytes Relative: 9 % (ref 3–12)
NEUTROS PCT: 62 % (ref 43–77)
Neutro Abs: 3.5 10*3/uL (ref 1.7–7.7)
PLATELETS: 484 10*3/uL — AB (ref 150–400)
RBC: 4.67 MIL/uL (ref 3.87–5.11)
RDW: 18.8 % — ABNORMAL HIGH (ref 11.5–15.5)
WBC: 5.7 10*3/uL (ref 4.0–10.5)

## 2014-05-25 LAB — COMPREHENSIVE METABOLIC PANEL
ALBUMIN: 3.4 g/dL — AB (ref 3.5–5.2)
ALK PHOS: 85 U/L (ref 39–117)
ALT: 47 U/L — AB (ref 0–35)
ANION GAP: 20 — AB (ref 5–15)
AST: 127 U/L — AB (ref 0–37)
BUN: 6 mg/dL (ref 6–23)
CO2: 18 mEq/L — ABNORMAL LOW (ref 19–32)
Calcium: 8.3 mg/dL — ABNORMAL LOW (ref 8.4–10.5)
Chloride: 102 mEq/L (ref 96–112)
Creatinine, Ser: 0.65 mg/dL (ref 0.50–1.10)
GFR calc Af Amer: >90 mL/min (ref >90)
GFR calc non Af Amer: >90 mL/min (ref >90)
Glucose, Bld: 83 mg/dL (ref 70–99)
Potassium: 3.8 mEq/L (ref 3.7–5.3)
Sodium: 140 mEq/L (ref 137–147)
TOTAL PROTEIN: 7.7 g/dL (ref 6.0–8.3)
Total Bilirubin: 0.4 mg/dL (ref 0.3–1.2)

## 2014-05-25 LAB — URINALYSIS, ROUTINE W REFLEX MICROSCOPIC
BILIRUBIN URINE: NEGATIVE
Glucose, UA: NEGATIVE mg/dL
Hgb urine dipstick: NEGATIVE
Ketones, ur: 15 mg/dL — AB
NITRITE: NEGATIVE
PH: 5 (ref 5.0–8.0)
Protein, ur: NEGATIVE mg/dL
SPECIFIC GRAVITY, URINE: 1.017 (ref 1.005–1.030)
Urobilinogen, UA: 0.2 mg/dL (ref 0.0–1.0)

## 2014-05-25 LAB — POC URINE PREG, ED: PREG TEST UR: NEGATIVE

## 2014-05-25 LAB — RAPID URINE DRUG SCREEN, HOSP PERFORMED
Amphetamines: NOT DETECTED
BARBITURATES: NOT DETECTED
BENZODIAZEPINES: NOT DETECTED
Cocaine: NOT DETECTED
Opiates: NOT DETECTED
Tetrahydrocannabinol: POSITIVE — AB

## 2014-05-25 LAB — ACETAMINOPHEN LEVEL: Acetaminophen (Tylenol), Serum: <15 ug/mL (ref 10–30)

## 2014-05-25 LAB — URINE MICROSCOPIC-ADD ON

## 2014-05-25 LAB — SALICYLATE LEVEL: Salicylate Lvl: <2 mg/dL — ABNORMAL LOW (ref 2.8–20.0)

## 2014-05-25 LAB — ETHANOL: Alcohol, Ethyl (B): 289 mg/dL — ABNORMAL HIGH (ref 0–11)

## 2014-05-25 MED ORDER — LORAZEPAM 1 MG PO TABS: 1.0000 mg | ORAL_TABLET | Freq: Two times a day (BID) | ORAL | Status: DC

## 2014-05-25 MED ORDER — SODIUM CHLORIDE 0.9 % IV BOLUS (SEPSIS)
500.0000 mL | Freq: Once | INTRAVENOUS | Status: AC
  Administered 2014-05-25: 500 mL via INTRAVENOUS

## 2014-05-25 MED ORDER — LORAZEPAM 2 MG/ML IJ SOLN: 1.0000 mg | Freq: Four times a day (QID) | INTRAMUSCULAR | Status: DC | PRN

## 2014-05-25 MED ORDER — FOLIC ACID 1 MG PO TABS
1.0000 mg | ORAL_TABLET | Freq: Every day | ORAL | Status: DC
  Administered 2014-05-25: 1 mg via ORAL
  Filled 2014-05-25: qty 1

## 2014-05-25 MED ORDER — LORAZEPAM 1 MG PO TABS: 1.0000 mg | ORAL_TABLET | Freq: Every day | ORAL | Status: DC

## 2014-05-25 MED ORDER — ONDANSETRON HCL 4 MG/2ML IJ SOLN
4.0000 mg | Freq: Once | INTRAMUSCULAR | Status: AC
  Administered 2014-05-25: 4 mg via INTRAVENOUS
  Filled 2014-05-25: qty 2

## 2014-05-25 MED ORDER — ALUM & MAG HYDROXIDE-SIMETH 200-200-20 MG/5ML PO SUSP: 30.0000 mL | ORAL | Status: DC | PRN

## 2014-05-25 MED ORDER — THIAMINE HCL 100 MG/ML IJ SOLN
100.0000 mg | Freq: Every day | INTRAMUSCULAR | Status: DC
  Administered 2014-05-25: 100 mg via INTRAVENOUS
  Filled 2014-05-25: qty 2

## 2014-05-25 MED ORDER — IBUPROFEN 400 MG PO TABS
600.0000 mg | ORAL_TABLET | Freq: Four times a day (QID) | ORAL | Status: DC | PRN
  Administered 2014-05-25 (×2): 600 mg via ORAL
  Filled 2014-05-25 (×4): qty 1

## 2014-05-25 MED ORDER — THIAMINE HCL 100 MG/ML IJ SOLN
100.0000 mg | Freq: Once | INTRAMUSCULAR | Status: AC
  Administered 2014-05-26: 100 mg via INTRAMUSCULAR
  Filled 2014-05-25: qty 2

## 2014-05-25 MED ORDER — VITAMIN B-1 100 MG PO TABS: 100.0000 mg | ORAL_TABLET | Freq: Every day | ORAL | Status: DC

## 2014-05-25 MED ORDER — IBUPROFEN 800 MG PO TABS: 800.0000 mg | ORAL_TABLET | Freq: Once | ORAL | Status: DC

## 2014-05-25 MED ORDER — DIPHENHYDRAMINE HCL 25 MG PO CAPS: 50.0000 mg | ORAL_CAPSULE | Freq: Four times a day (QID) | ORAL | Status: DC | PRN

## 2014-05-25 MED ORDER — LORAZEPAM 1 MG PO TABS
1.0000 mg | ORAL_TABLET | Freq: Four times a day (QID) | ORAL | Status: DC
  Administered 2014-05-26 (×3): 1 mg via ORAL
  Filled 2014-05-25 (×3): qty 1

## 2014-05-25 MED ORDER — ADULT MULTIVITAMIN W/MINERALS CH
1.0000 | ORAL_TABLET | Freq: Every day | ORAL | Status: DC
  Administered 2014-05-25: 1 via ORAL
  Filled 2014-05-25: qty 1

## 2014-05-25 MED ORDER — ACETAMINOPHEN 325 MG PO TABS
650.0000 mg | ORAL_TABLET | Freq: Four times a day (QID) | ORAL | Status: DC | PRN
  Administered 2014-05-26 (×2): 650 mg via ORAL
  Filled 2014-05-25 (×2): qty 2

## 2014-05-25 MED ORDER — LORAZEPAM 1 MG PO TABS: 1.0000 mg | ORAL_TABLET | Freq: Four times a day (QID) | ORAL | Status: DC | PRN

## 2014-05-25 MED ORDER — OXYCODONE HCL 5 MG PO TABS
5.0000 mg | ORAL_TABLET | Freq: Once | ORAL | Status: AC
  Administered 2014-05-25: 5 mg via ORAL
  Filled 2014-05-25: qty 1

## 2014-05-25 MED ORDER — LORAZEPAM 1 MG PO TABS: 1.0000 mg | ORAL_TABLET | Freq: Three times a day (TID) | ORAL | Status: DC

## 2014-05-25 MED ORDER — HYDROCODONE-ACETAMINOPHEN 5-325 MG PO TABS
1.0000 | ORAL_TABLET | Freq: Four times a day (QID) | ORAL | Status: DC | PRN
  Administered 2014-05-25: 2 via ORAL
  Filled 2014-05-25: qty 2

## 2014-05-25 MED ORDER — ADULT MULTIVITAMIN W/MINERALS CH
1.0000 | ORAL_TABLET | Freq: Every day | ORAL | Status: DC
  Administered 2014-05-26: 1 via ORAL
  Filled 2014-05-25 (×3): qty 1

## 2014-05-25 MED ORDER — HYDROXYZINE HCL 25 MG PO TABS
25.0000 mg | ORAL_TABLET | Freq: Four times a day (QID) | ORAL | Status: DC | PRN
  Administered 2014-05-26 (×2): 25 mg via ORAL
  Filled 2014-05-25 (×2): qty 1

## 2014-05-25 MED ORDER — TRAZODONE HCL 50 MG PO TABS
50.0000 mg | ORAL_TABLET | Freq: Every evening | ORAL | Status: DC | PRN
  Filled 2014-05-25 (×2): qty 1

## 2014-05-25 MED ORDER — MAGNESIUM HYDROXIDE 400 MG/5ML PO SUSP: 30.0000 mL | Freq: Every day | ORAL | Status: DC | PRN

## 2014-05-25 MED ORDER — LORAZEPAM 1 MG PO TABS
0.0000 mg | ORAL_TABLET | Freq: Four times a day (QID) | ORAL | Status: DC
  Administered 2014-05-25 (×2): 2 mg via ORAL
  Filled 2014-05-25 (×2): qty 2

## 2014-05-25 MED ORDER — LORAZEPAM 1 MG PO TABS
1.0000 mg | ORAL_TABLET | Freq: Four times a day (QID) | ORAL | Status: DC | PRN
  Administered 2014-05-25: 1 mg via ORAL
  Filled 2014-05-25: qty 1

## 2014-05-25 MED ORDER — VITAMIN B-1 100 MG PO TABS
100.0000 mg | ORAL_TABLET | Freq: Every day | ORAL | Status: DC
  Administered 2014-05-26: 100 mg via ORAL
  Filled 2014-05-25 (×3): qty 1

## 2014-05-25 MED ORDER — LORAZEPAM 1 MG PO TABS: 0.0000 mg | ORAL_TABLET | Freq: Two times a day (BID) | ORAL | Status: DC

## 2014-05-25 MED ORDER — LOPERAMIDE HCL 2 MG PO CAPS: 2.0000 mg | ORAL_CAPSULE | ORAL | Status: DC | PRN

## 2014-05-25 MED ORDER — ONDANSETRON 4 MG PO TBDP
4.0000 mg | ORAL_TABLET | Freq: Four times a day (QID) | ORAL | Status: DC | PRN
  Administered 2014-05-26: 4 mg via ORAL
  Filled 2014-05-25: qty 1

## 2014-05-25 MED ORDER — NAPROXEN 375 MG PO TABS
375.0000 mg | ORAL_TABLET | Freq: Two times a day (BID) | ORAL | Status: DC | PRN
  Administered 2014-05-26: 375 mg via ORAL
  Filled 2014-05-25: qty 1

## 2014-05-25 NOTE — ED Notes (Signed)
security cleared pt.

## 2014-05-25 NOTE — ED Notes (Signed)
Pt admits to smoking marijuana and taking a percocet tablet this morning.

## 2014-05-25 NOTE — ED Provider Notes (Signed)
CSN: [id number]     Arrival date & time 05/25/14  1121 History   First MD Initiated Contact with Patient 05/25/14 1158     Chief Complaint  Patient presents with  . Alcohol Problem  . Medical Clearance     (Consider location/radiation/quality/duration/timing/severity/associated sxs/prior Treatment) HPI Comments: Patient presents to the ER for concern of alcohol abuse. Patient reports that she has been drinking heavily for at least 2 years, since her sister died. Patient reports that when she does not drink she gets very shaky, but has never had any seizures. She has never been through detox or rehabilitation program. Patient denies suicidality, homicidality.  Patient is a 33 y.o. female presenting with alcohol problem.  Alcohol Problem    Past Medical History  Diagnosis Date  . Acid reflux   . Asthma   . History of blood transfusion   . Multiple sclerosis 2005    [place] in [place], failed steroids, no other medication  . Anxiety   . Gastric ulcer   . Migraine   . Chronic nausea     self reported  . Chronic diarrhea     self reported  . Chronic pain     self reported chronic pain, back pain, fibromyalgia  . Insomnia    Past Surgical History  Procedure Laterality Date  . Gastric bypass    . Abdominal surgery  2003    weight loss of over 200 lbs  . Lumbar laminectomy/decompression microdiscectomy Right 04/14/2014    Procedure: LUMBAR LAMINECTOMY/DECOMPRESSION MICRODISCECTOMY LEVEL L4-5;  Surgeon: Maeola HarmanJoseph Stern, MD;  Location: [facility];  Service: Neurosurgery;  Laterality: Right;  LUMBAR LAMINECTOMY/DECOMPRESSION MICRODISCECTOMY LEVEL L4-5   Family History  Problem Relation Age of Onset  . Hypertension Paternal Grandfather   . Colon cancer Paternal Grandfather 6170  . Asthma Paternal Grandmother   . Hypertension Paternal Grandmother   . Breast cancer Paternal Grandmother   . Cancer Paternal Grandmother     lung   . Diabetes Maternal Grandmother   . Diabetes Maternal  Grandfather   . Hypertension Father   . Urinary tract infection Mother   . Stroke Mother 6960    3 strokes    History  Substance Use Topics  . Smoking status: Current Some Day Smoker    Types: Cigarettes    Last Attempt to Quit: 05/14/2011  . Smokeless tobacco: Never Used  . Alcohol Use: 1.2 oz/week    1 Cans of beer, 1 Shots of liquor per week   OB History   Grav Para Term Preterm Abortions TAB SAB Ect Mult Living   0         0     Review of Systems  Neurological: Positive for tremors.  Psychiatric/Behavioral: Positive for suicidal ideas.  All other systems reviewed and are negative.     Allergies  Cymbalta; Dilaudid; and Prednisone  Home Medications   Prior to Admission medications   Medication Sig Start Date End Date Taking? Authorizing Provider  diazepam (VALIUM) 5 MG tablet Take 1 tablet (5 mg total) by mouth every 6 (six) hours as needed for muscle spasms. 04/16/14  Yes Maeola HarmanJoseph Stern, MD  HYDROcodone-acetaminophen (NORCO) 7.5-325 MG per tablet Take 1 tablet by mouth every 6 (six) hours as needed for moderate pain.   Yes Historical Provider, MD  oxyCODONE-acetaminophen (PERCOCET) 10-325 MG per tablet Take 1 tablet by mouth every 4 (four) hours as needed for pain.   Yes Historical Provider, MD   BP 108/63  Pulse 81  Temp(Src) 98.1 F (36.7 C) (Oral)  Resp 16  Ht 5\' 6"  (1.676 m)  Wt 240 lb (108.863 kg)  BMI 38.76 kg/m2  SpO2 98% Physical Exam  Constitutional: She is oriented to person, place, and time. She appears well-developed and well-nourished. No distress.  HENT:  Head: Normocephalic and atraumatic.  Right Ear: Hearing normal.  Left Ear: Hearing normal.  Nose: Nose normal.  Mouth/Throat: Oropharynx is clear and moist and mucous membranes are normal.  Eyes: Conjunctivae and EOM are normal. Pupils are equal, round, and reactive to light.  Neck: Normal range of motion. Neck supple.  Cardiovascular: Regular rhythm, S1 normal and S2 normal.  Exam reveals no  gallop and no friction rub.   No murmur heard. Pulmonary/Chest: Effort normal and breath sounds normal. No respiratory distress. She exhibits no tenderness.  Abdominal: Soft. Normal appearance and bowel sounds are normal. There is no hepatosplenomegaly. There is no tenderness. There is no rebound, no guarding, no tenderness at McBurney's point and negative Murphy's sign. No hernia.  Musculoskeletal: Normal range of motion.  Neurological: She is alert and oriented to person, place, and time. She has normal strength. No cranial nerve deficit or sensory deficit. Coordination normal. GCS eye subscore is 4. GCS verbal subscore is 5. GCS motor subscore is 6.  Very shaky  Skin: Skin is warm, dry and intact. No rash noted. No cyanosis.  Psychiatric: She has a normal mood and affect. Her speech is normal and behavior is normal. Thought content normal.    ED Course  Procedures (including critical care time) Labs Review Labs Reviewed  CBC WITH DIFFERENTIAL - Abnormal; Notable for the following:    Hemoglobin 9.9 (*)    HCT 33.4 (*)    MCV 71.5 (*)    MCH 21.2 (*)    MCHC 29.6 (*)    RDW 18.8 (*)    Platelets 484 (*)    All other components within normal limits  COMPREHENSIVE METABOLIC PANEL - Abnormal; Notable for the following:    CO2 18 (*)    Calcium 8.3 (*)    Albumin 3.4 (*)    AST 127 (*)    ALT 47 (*)    Anion gap 20 (*)    All other components within normal limits  ETHANOL - Abnormal; Notable for the following:    Alcohol, Ethyl (B) 289 (*)    All other components within normal limits  SALICYLATE LEVEL - Abnormal; Notable for the following:    Salicylate Lvl <2.0 (*)    All other components within normal limits  ACETAMINOPHEN LEVEL  URINALYSIS, ROUTINE W REFLEX MICROSCOPIC  URINE RAPID DRUG SCREEN (HOSP PERFORMED)  POC URINE PREG, ED    Imaging Review No results found.   EKG Interpretation None      MDM   Final diagnoses:  None   alcohol abuse  Patient presents  to the ER for evaluation of alcohol abuse. Patient's medical work up is unremarkable. She is very slightly elevated LFTs consistent with alcoholism, but nothing acute. Patient medically clear.    Gilda Crease, MD 05/25/14 [phone]

## 2014-05-25 NOTE — ED Notes (Addendum)
Pt reports ETOH problem, sts when she does stop drinking she gets very shakey and usually vomits. Denies ever having a seizure from withdrawl. Last drank ETOH and used Marijuana and Percocet at 9am today. Pt sts she just had lumbar sx within the past month, denies pain/increased weakness following the sx. Pt sts her sister died 2 years ago and that is mainly when she started drinking ETOH and using drugs.

## 2014-05-25 NOTE — ED Notes (Signed)
Patient states that she is having severe pains in her lower back where she had surgery at a month ago.

## 2014-05-25 NOTE — ED Notes (Signed)
Family at bedside. Patient was given a hot pack to put on her lower back for the discomfort.

## 2014-05-25 NOTE — ED Notes (Addendum)
Pt given paper scrubs to change into. Security paged to wand pt.

## 2014-05-25 NOTE — ED Notes (Signed)
Pt has a piece of paper with different numbers on it that we would be able to contact. This list is as follows:  Pt mother: [phone]  Amado Nashim, Pts [phone]  Pt Father- [phone]  Algernon HuxleyGlen- [phone]

## 2014-05-25 NOTE — Discharge Instructions (Signed)
Alcohol Intoxication °Alcohol intoxication occurs when the amount of alcohol that a person has consumed impairs his or her ability to mentally and physically function. Alcohol directly impairs the normal chemical activity of the brain. Drinking large amounts of alcohol can lead to changes in mental function and behavior, and it can cause many physical effects that can be harmful.  °Alcohol intoxication can range in severity from mild to very severe. Various factors can affect the level of intoxication that occurs, such as the person's age, gender, weight, frequency of alcohol consumption, and the presence of other medical conditions (such as diabetes, seizures, or heart conditions). Dangerous levels of alcohol intoxication may occur when people drink large amounts of alcohol in a short period (binge drinking). Alcohol can also be especially dangerous when combined with certain prescription medicines or "recreational" drugs. °SIGNS AND SYMPTOMS °Some common signs and symptoms of mild alcohol intoxication include: °· Loss of coordination. °· Changes in mood and behavior. °· Impaired judgment. °· Slurred speech. °As alcohol intoxication progresses to more severe levels, other signs and symptoms will appear. These may include: °· Vomiting. °· Confusion and impaired memory. °· Slowed breathing. °· Seizures. °· Loss of consciousness. °DIAGNOSIS  °Your health care provider will take a medical history and perform a physical exam. You will be asked about the amount and type of alcohol you have consumed. Blood tests will be done to measure the concentration of alcohol in your blood. In many places, your blood alcohol level must be lower than 80 mg/dL (0.08%) to legally drive. However, many dangerous effects of alcohol can occur at much lower levels.  °TREATMENT  °People with alcohol intoxication often do not require treatment. Most of the effects of alcohol intoxication are temporary, and they go away as the alcohol naturally  leaves the body. Your health care provider will monitor your condition until you are stable enough to go home. Fluids are sometimes given through an IV access tube to help prevent dehydration.  °HOME CARE INSTRUCTIONS °· Do not drive after drinking alcohol. °· Stay hydrated. Drink enough water and fluids to keep your urine clear or pale yellow. Avoid caffeine.   °· Only take over-the-counter or prescription medicines as directed by your health care provider.   °SEEK MEDICAL CARE IF:  °· You have persistent vomiting.   °· You do not feel better after a few days. °· You have frequent alcohol intoxication. Your health care provider can help determine if you should see a substance use treatment counselor. °SEEK IMMEDIATE MEDICAL CARE IF:  °· You become shaky or tremble when you try to stop drinking.   °· You shake uncontrollably (seizure).   °· You throw up (vomit) blood. This may be bright red or may look like black coffee grounds.   °· You have blood in your stool. This may be bright red or may appear as a black, tarry, bad smelling stool.   °· You become lightheaded or faint.   °MAKE SURE YOU:  °· Understand these instructions. °· Will watch your condition. °· Will get help right away if you are not doing well or get worse. °Document Released: 07/25/2005 Document Revised: 06/17/2013 Document Reviewed: 03/20/2013 °ExitCare® Patient Information ©2015 [employer]. This information is not intended to replace advice given to you by your health care provider. Make sure you discuss any questions you have with your health care provider. ° °Alcohol Withdrawal °Anytime drug use is interfering with normal living activities it has become abuse. This includes problems with family and friends. Psychological dependence has developed   when your mind tells you that the drug is needed. This is usually followed by physical dependence when a continuing increase of drugs are required to get the same feeling or "high." This is known as  addiction or chemical dependency. A person's risk is much higher if there is a history of chemical dependency in the family. °Mild Withdrawal Following Stopping Alcohol, When Addiction or Chemical Dependency Has Developed °When a person has developed tolerance to alcohol, any sudden stopping of alcohol can cause uncomfortable physical symptoms. Most of the time these are mild and consist of tremors in the hands and increases in heart rate, breathing, and temperature. Sometimes these symptoms are associated with anxiety, panic attacks, and bad dreams. There may also be stomach upset. Normal sleep patterns are often interrupted with periods of inability to sleep (insomnia). This may last for 6 months. Because of this discomfort, many people choose to continue drinking to get rid of this discomfort and to try to feel normal. °Severe Withdrawal with Decreased or No Alcohol Intake, When Addiction or Chemical Dependency Has Developed °About five percent of alcoholics will develop signs of severe withdrawal when they stop using alcohol. One sign of this is development of generalized seizures (convulsions). Other signs of this are severe agitation and confusion. This may be associated with believing in things which are not real or seeing things which are not really there (delusions and hallucinations). Vitamin deficiencies are usually present if alcohol intake has been long-term. Treatment for this most often requires hospitalization and close observation. °Addiction can only be helped by stopping use of all chemicals. This is hard but may save your life. With continual alcohol use, possible outcomes are usually loss of self respect and esteem, violence, and death. °Addiction cannot be cured but it can be stopped. This often requires outside help and the care of professionals. Treatment centers are listed in the yellow pages under Cocaine, Narcotics, and Alcoholics Anonymous. Most hospitals and clinics can refer you to a  specialized care center. °It is not necessary for you to go through the uncomfortable symptoms of withdrawal. Your caregiver can provide you with medicines that will help you through this difficult period. Try to avoid situations, friends, or drugs that made it possible for you to keep using alcohol in the past. Learn how to say no. °It takes a long period of time to overcome addictions to all drugs, including alcohol. There may be many times when you feel as though you want a drink. After getting rid of the physical addiction and withdrawal, you will have a lessening of the craving which tells you that you need alcohol to feel normal. Call your caregiver if more support is needed. Learn who to talk to in your family and among your friends so that during these periods you can receive outside help. Alcoholics Anonymous (AA) has helped many people over the years. To get further help, contact AA or call your caregiver, counselor, or clergyperson. Al-Anon and Alateen are support groups for friends and family members of an alcoholic. The people who love and care for an alcoholic often need help, too. For information about these organizations, check your phone directory or call a local alcoholism treatment center.  °SEEK IMMEDIATE MEDICAL CARE IF:  °· You have a seizure. °· You have a fever. °· You experience uncontrolled vomiting or you vomit up blood. This may be bright red or look like black coffee grounds. °· You have blood in the stool. This may be bright   red or appear as a black, tarry, bad-smelling stool. °· You become lightheaded or faint. Do not drive if you feel this way. Have someone else drive you or call 911 for help. °· You become more agitated or confused. °· You develop uncontrolled anxiety. °· You begin to see things that are not really there (hallucinate). °Your caregiver has determined that you completely understand your medical condition, and that your mental state is back to normal. You understand  that you have been treated for alcohol withdrawal, have agreed not to drink any alcohol for a minimum of 1 day, will not operate a car or other machinery for 24 hours, and have had an opportunity to ask any questions about your condition. °Document Released: 07/25/2005 Document Revised: 01/07/2012 Document Reviewed: 06/02/2008 °ExitCare® Patient Information ©2015 [employer]. This information is not intended to replace advice given to you by your health care provider. Make sure you discuss any questions you have with your health care provider. ° °

## 2014-05-25 NOTE — BH Assessment (Signed)
Relayed results of TA to Donell Sievert, [place]. Per Donell Sievert, PA Pt could be admitted to to Observation unit.   Spoke with Dr. Blinda Leatherwood EDP who is in agreement with Pt being admitted to Obs. Unit.   Per Julieanne Cotton, RN pt can be admitted to bed 2 under the care of Dr. Lucianne Muss.   Relayed information to [place], RN about Pt being accepted. He will have Pt sign voluntary form.   Informed registration tech Rosebud Poles that pt will be coming to observation.   Clista Bernhardt, [facility] Triage Specialist 05/25/2014 8:43 PM

## 2014-05-25 NOTE — ED Notes (Addendum)
Pt reports headache and no decrease in withdrawal symptoms at this time.  Pt endorsesanxiety, tremors, and mild diaphoresis. Dr. Blinda Leatherwood made aware.
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
## 2014-05-25 NOTE — ED Notes (Signed)
Pt requesting detox from alcohol, last drank this am. Denies any SI or HI.

## 2014-05-25 NOTE — BH Assessment (Signed)
Requested TA equipment be set up.   Spoke with Dr. Blinda Leatherwood prior to assessment. Per Dr. Blinda Leatherwood Pt reports heavy drinking since murder of sister two years ago. Denies HI/SI. Nearing anniversary of sister's death and Pt's birthday. She wants help to stop drinking.   TA to commence shortly.   Clista Bernhardt, [facility] Triage Specialist 05/25/2014 7:43 PM

## 2014-05-26 NOTE — Plan of Care (Signed)
[facility] Observation Crisis Plan  Reason for Crisis Plan:  Substance Abuse   Plan of Care:  Referral for IOP  Family Support:    Mother, boyfriend  Current Living Environment:  Living Arrangements: Parent;Spouse/significant other (Half-time with mom, half with boyfriend)  Insurance:  Self-Pay (pt no longer has BC/BS coverage) Hospital Account   Name Acct ID Class Status Primary Coverage   Beth [name], Beth [name] [id number] BEHAVIORAL HEALTH OBSERVATION Open BLUE CROSS BLUE SHIELD - BCBS [place] PPO        Guarantor Account (for Hospital Account [id number])   Name Relation to Pt Service Area Active? Acct Type   [name] Self [facility] Yes Behavioral Health   Address Phone       [address] [place], [place] [address] [phone](H)          Coverage Information (for Hospital Account [id number])   F/O Payor/Plan Precert #   BLUE CROSS BLUE SHIELD/BCBS [place] PPO    Subscriber Subscriber #   Beth [name], Beth [name] [id number]   Address Phone   [address] [place], [place] [address] [phone]      Legal Guardian:   Self  Primary Care Provider:  Ernst Breach, PA-C  Current Outpatient Providers:  None  Psychiatrist:   None  Counselor/Therapist:   None  Compliant with Medications:  Yes  Additional Information: After consulting with Claudette Head, NP it has been determined that pt does not present a life threatening danger to herself or others, and that psychiatric hospitalization is not indicated for her at this time.  He recommends that pt be referred to the following providers for her various needs: *[facility] at [facility] or at [facility]; *[facility], [facility], or [facility] for psychiatry *[facility] for grief counseling These referral resources have been included in pt's discharge instructions.   Beth [name] 7/29/20153:18 PM
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
## 2014-05-26 NOTE — Plan of Care (Signed)
Ms. Cahoon, 33, was polite and cooperative upon arrival to Observation. She was AOX4, with notable anxiety and tremors. (Initial CIWA 7.) She complained of a headache of a 9. She denied SI, HI, and a/v disturbances. She admits to drinking a variable amount. "It can be up to a half-gallon a day." Her mother, Mellody Drown, was with her upon arrival to [facility] and had questions about the process that were answered by staff. Ms. Glickstein said she often has trouble sleeping, both getting and staying asleep. She denied feeling depressed but said she has anxiety and has experienced panic attacks. Ms. Corson complained of nausea and said she had vomited earlier medications. Medications that were given were reviewed with Karleen Hampshire, NP. She is presently resting with eyes closed and regular/even respirations. No acute distress noted. Will continue to monitor for needs and safety.

## 2014-05-26 NOTE — Plan of Care (Signed)
Pt indicated earlier in evening that she has several family members who have abused alcohol, including her father and maternal grandmother. She reported that her sister's death two years ago was "deemed suicide but open for investigation." Her Beth CarinaUncle Randy committed suicide as well, she reported.
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
## 2014-05-26 NOTE — Progress Notes (Signed)
Patient denies SI/HI or psychosis. Patient verbalized understanding of discharge plan. Mother picked patient up at discharge.

## 2014-05-26 NOTE — Progress Notes (Signed)
[facility] INPATIENT:  Family/Significant Other Suicide Prevention Education  Suicide Prevention Education:  Patient Refusal for Family/Significant Other Suicide Prevention Education: The patient Beth [name] has refused to provide written consent for family/significant other to be provided Family/Significant Other Suicide Prevention Education during admission and/or prior to discharge.  Physician notified.  Sidi Dzikowski 05/26/2014, 3:40 PM

## 2014-05-26 NOTE — Plan of Care (Signed)
[facility] Observation Crisis Plan  Reason for Crisis Plan:  Substance Abuse   Plan of Care:  Referral for Substance Abuse  Family Support:    Mother  Current Living Environment:  Living Arrangements: Parent;Spouse/significant other (Half-time with mom, half with boyfriend)  Insurance:   Hospital Account   Name Acct ID Class Status Primary Coverage   Beth [name], Beth [name] [id number] BEHAVIORAL HEALTH OBSERVATION Open BLUE CROSS BLUE SHIELD - [employer] [place] PPO        Guarantor Account (for Hospital Account [id number])   Name Relation to Pt Service Area Active? Acct Type   Frederica Kuster A Self [facility] Yes Behavioral Health   Address Phone       [address] [place], [place] [address] [phone](H)          Coverage Information (for Hospital Account [id number])   F/O Payor/Plan Precert #   BLUE CROSS BLUE SHIELD/BCBS [place] PPO    Subscriber Subscriber #   [place], [place] [id number]   Address Phone   [address] [place], [place] [address] [phone]      Legal Guardian:     Primary Care Provider:  Ernst Breach, PA-C  Current Outpatient Providers:  None  Psychiatrist:  None   Counselor/Therapist:   None  Compliant with Medications:  Yes  Additional Information:   Maurine Simmering 7/29/20151:12 AM

## 2014-05-26 NOTE — Discharge Instructions (Addendum)
The following options are available to you for your various behavioral health needs:  For help maintaining a sober lifestyle, a Chemical Dependency Intensive Outpatient Program (CD-IOP) may be beneficial.  Consider one of the following programs:       [facility] [place]      [address]      [place], [place] [address]      [phone]      Contact person: Charmian Muffnn Evans, LCAS       [facility]      [address]      [place], [place] [address]      [phone]  For psychiatry/medication management, the following agencies may be helpful.  In some cases they are also able to provide counseling:       [facility]      [address]      [place], [place]      [phone]       [facility]      [address]      [place], [place] [address]      [phone]) [phone]       [facility]      [address]     [place] [address]      [phone]  For grief counseling, contact:       [facility]      [address]      [place], [place] [address]      [phone]) [phone]
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
## 2014-05-27 ENCOUNTER — Encounter (HOSPITAL_COMMUNITY): Payer: Self-pay | Admitting: Emergency Medicine

## 2014-05-27 ENCOUNTER — Emergency Department (HOSPITAL_COMMUNITY)
Admission: EM | Admit: 2014-05-27 | Discharge: 2014-05-27 | Disposition: A | Discharge status: Home / Self Care | Payer: BC Managed Care – PPO | Attending: Emergency Medicine | Admitting: Emergency Medicine

## 2014-05-27 DIAGNOSIS — F10930 Alcohol use, unspecified with withdrawal, uncomplicated: Secondary | ICD-10-CM

## 2014-05-27 DIAGNOSIS — Z79899 Other long term (current) drug therapy: Secondary | ICD-10-CM | POA: Insufficient documentation

## 2014-05-27 DIAGNOSIS — F10939 Alcohol use, unspecified with withdrawal, unspecified: Secondary | ICD-10-CM | POA: Insufficient documentation

## 2014-05-27 DIAGNOSIS — F17200 Nicotine dependence, unspecified, uncomplicated: Secondary | ICD-10-CM | POA: Insufficient documentation

## 2014-05-27 DIAGNOSIS — J45909 Unspecified asthma, uncomplicated: Secondary | ICD-10-CM | POA: Insufficient documentation

## 2014-05-27 DIAGNOSIS — F10239 Alcohol dependence with withdrawal, unspecified: Secondary | ICD-10-CM | POA: Insufficient documentation

## 2014-05-27 DIAGNOSIS — G8929 Other chronic pain: Secondary | ICD-10-CM | POA: Insufficient documentation

## 2014-05-27 DIAGNOSIS — R110 Nausea: Secondary | ICD-10-CM | POA: Insufficient documentation

## 2014-05-27 DIAGNOSIS — G479 Sleep disorder, unspecified: Secondary | ICD-10-CM | POA: Insufficient documentation

## 2014-05-27 DIAGNOSIS — Z8719 Personal history of other diseases of the digestive system: Secondary | ICD-10-CM | POA: Insufficient documentation

## 2014-05-27 DIAGNOSIS — F411 Generalized anxiety disorder: Secondary | ICD-10-CM | POA: Insufficient documentation

## 2014-05-27 DIAGNOSIS — R51 Headache: Secondary | ICD-10-CM | POA: Insufficient documentation

## 2014-05-27 DIAGNOSIS — F1010 Alcohol abuse, uncomplicated: Secondary | ICD-10-CM | POA: Insufficient documentation

## 2014-05-27 DIAGNOSIS — Z8669 Personal history of other diseases of the nervous system and sense organs: Secondary | ICD-10-CM | POA: Insufficient documentation

## 2014-05-27 DIAGNOSIS — R109 Unspecified abdominal pain: Secondary | ICD-10-CM | POA: Insufficient documentation

## 2014-05-27 DIAGNOSIS — F10230 Alcohol dependence with withdrawal, uncomplicated: Secondary | ICD-10-CM

## 2014-05-27 DIAGNOSIS — Z0489 Encounter for examination and observation for other specified reasons: Secondary | ICD-10-CM | POA: Insufficient documentation

## 2014-05-27 LAB — COMPREHENSIVE METABOLIC PANEL
ALT: 43 U/L — AB (ref 0–35)
AST: 70 U/L — AB (ref 0–37)
Albumin: 3.5 g/dL (ref 3.5–5.2)
Alkaline Phosphatase: 83 U/L (ref 39–117)
Anion gap: 19 — ABNORMAL HIGH (ref 5–15)
BUN: 3 mg/dL — ABNORMAL LOW (ref 6–23)
CO2: 20 meq/L (ref 19–32)
Calcium: 8.9 mg/dL (ref 8.4–10.5)
Chloride: 103 mEq/L (ref 96–112)
Creatinine, Ser: 0.63 mg/dL (ref 0.50–1.10)
GFR calc Af Amer: >90 mL/min (ref >90)
GFR calc non Af Amer: >90 mL/min (ref >90)
Glucose, Bld: 82 mg/dL (ref 70–99)
Potassium: 3.8 mEq/L (ref 3.7–5.3)
SODIUM: 142 meq/L (ref 137–147)
TOTAL PROTEIN: 7.6 g/dL (ref 6.0–8.3)
Total Bilirubin: 0.2 mg/dL — ABNORMAL LOW (ref 0.3–1.2)

## 2014-05-27 LAB — CBC
HCT: 32 % — ABNORMAL LOW (ref 36.0–46.0)
Hemoglobin: 9.5 g/dL — ABNORMAL LOW (ref 12.0–15.0)
MCH: 21.3 pg — AB (ref 26.0–34.0)
MCHC: 29.7 g/dL — AB (ref 30.0–36.0)
MCV: 71.7 fL — ABNORMAL LOW (ref 78.0–100.0)
PLATELETS: 402 10*3/uL — AB (ref 150–400)
RBC: 4.46 MIL/uL (ref 3.87–5.11)
RDW: 18.5 % — ABNORMAL HIGH (ref 11.5–15.5)
WBC: 6.4 10*3/uL (ref 4.0–10.5)

## 2014-05-27 LAB — SALICYLATE LEVEL

## 2014-05-27 LAB — RAPID URINE DRUG SCREEN, HOSP PERFORMED
AMPHETAMINES: NOT DETECTED
BENZODIAZEPINES: NOT DETECTED
Barbiturates: NOT DETECTED
Cocaine: NOT DETECTED
OPIATES: NOT DETECTED
Tetrahydrocannabinol: NOT DETECTED

## 2014-05-27 LAB — ETHANOL: Alcohol, Ethyl (B): 123 mg/dL — ABNORMAL HIGH (ref 0–11)

## 2014-05-27 LAB — ACETAMINOPHEN LEVEL: Acetaminophen (Tylenol), Serum: <15 ug/mL (ref 10–30)

## 2014-05-27 MED ORDER — IBUPROFEN 400 MG PO TABS
600.0000 mg | ORAL_TABLET | Freq: Once | ORAL | Status: AC
  Administered 2014-05-27: 600 mg via ORAL
  Filled 2014-05-27 (×2): qty 1

## 2014-05-27 MED ORDER — ONDANSETRON 8 MG PO TBDP: 4.0000 mg | ORAL_TABLET | Freq: Four times a day (QID) | ORAL | Status: DC | PRN

## 2014-05-27 MED ORDER — CHLORDIAZEPOXIDE HCL 25 MG PO CAPS: 25.0000 mg | ORAL_CAPSULE | Freq: Four times a day (QID) | ORAL | Status: DC | PRN

## 2014-05-27 MED ORDER — CHLORDIAZEPOXIDE HCL 25 MG PO CAPS
25.0000 mg | ORAL_CAPSULE | Freq: Once | ORAL | Status: AC
  Administered 2014-05-27: 25 mg via ORAL
  Filled 2014-05-27: qty 1

## 2014-05-27 MED ORDER — ONDANSETRON 4 MG PO TBDP
8.0000 mg | ORAL_TABLET | Freq: Once | ORAL | Status: AC
  Administered 2014-05-27: 8 mg via ORAL
  Filled 2014-05-27: qty 2
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
## 2014-05-27 NOTE — BH Assessment (Addendum)
Pt labs are back, and are visible once clicking out and back in. Blood alcohol level 123. Spoke with nurse who reports no concerns with labs.   Will confer with extender and EDP and then relay recommendations to nurse.   Spoke with Alberteen Sam, NP regarding the results of the TA. She would like to know if pt will sign herself in for tx.   Spoke with nurse who reports pt is refusing to go  [facility] because her parents can not go with her.   Spoke with Dondra Spry, NP who reports she was able to work out pt going to [facility]. She will discuss options with pt while this providers is on the phone to answer questions. Pt declined bed at [facility] and will be sent home with medication and OP resources per Dondra Spry, NP the EDP.   Clista Bernhardt, [facility] Triage Specialist 05/27/2014 10:52 PM

## 2014-05-27 NOTE — ED Notes (Signed)
Pt. Has requested for help with alcohol abuse.  Last drank today fifth of vodka.  Pt. Has been drinking for 2 years.  Pt. Is also having nausea and black stools.  Pt. Was in detox last week and discharged yesterday but relapsed.  Pt. Is alert and oriented X3.,

## 2014-05-27 NOTE — Discharge Instructions (Signed)
Finding Treatment for Alcohol and Drug Addiction It can be hard to find the right place to get professional treatment. Here are some important things to consider:  There are different types of treatment to choose from.  Some programs are live-in (residential) while others are not (outpatient). Sometimes a combination is offered.  No single type of program is right for everyone.  Most treatment programs involve a combination of education, counseling, and a 12-step, spiritually-based approach.  There are non-spiritually based programs (not 12-step).  Some treatment programs are government sponsored. They are geared for patients without private insurance.  Treatment programs can vary in many respects such as:  Cost and types of insurance accepted.  Types of on-site medical services offered.  Length of stay, setting, and size.  Overall philosophy of treatment. A person may need specialized treatment or have needs not addressed by all programs. For example, adolescents need treatment appropriate for their age. Other people have secondary disorders that must be managed as well. Secondary conditions can include mental illness, such as depression or diabetes. Often, a period of detoxification from alcohol or drugs is needed. This requires medical supervision and not all programs offer this. THINGS TO CONSIDER WHEN SELECTING A TREATMENT PROGRAM   Is the program certified by the appropriate government agency? Even private programs must be certified and employ certified professionals.  Does the program accept your insurance? If not, can a payment plan be set up?  Is the facility clean, organized, and well run? Do they allow you to speak with graduates who can share their treatment experience with you? Can you tour the facility? Can you meet with staff?  Does the program meet the full range of individual needs?  Does the treatment program address sexual orientation and physical disabilities?  Do they provide age, gender, and culturally appropriate treatment services?  Is treatment available in languages other than English?  Is long-term aftercare support or guidance encouraged and provided?  Is assessment of an individual's treatment plan ongoing to ensure it meets changing needs?  Does the program use strategies to encourage reluctant patients to remain in treatment long enough to increase the likelihood of success?  Does the program offer counseling (individual or group) and other behavioral therapies?  Does the program offer medicine as part of the treatment regimen, if needed?  Is there ongoing monitoring of possible relapse? Is there a defined relapse prevention program? Are services or referrals offered to family members to ensure they understand addiction and the recovery process? This would help them support the recovering individual.  Are 12-step meetings held at the center or is transport available for patients to attend outside meetings? In countries outside of the [place] and [place], Magazine features editorsee local directories for contact information for services in your area. Document Released: 09/13/2005 Document Revised: 01/07/2012 Document Reviewed: 03/25/2008 [facility] Patient Information 2015 [place]Care, [place]. This information is not intended to replace advice given to you by your health care provider. Make sure you discuss any questions you have with your health care provider.  Alcohol Withdrawal Anytime drug use is interfering with normal living activities it has become abuse. This includes problems with family and friends. Psychological dependence has developed when your mind tells you that the drug is needed. This is usually followed by physical dependence when a continuing increase of drugs are required to get the same feeling or "high." This is known as addiction or chemical dependency. A person's risk is much higher if there is a history of chemical  dependency in the family. Mild  Withdrawal Following Stopping Alcohol, When Addiction or Chemical Dependency Has Developed When a person has developed tolerance to alcohol, any sudden stopping of alcohol can cause uncomfortable physical symptoms. Most of the time these are mild and consist of tremors in the hands and increases in heart rate, breathing, and temperature. Sometimes these symptoms are associated with anxiety, panic attacks, and bad dreams. There may also be stomach upset. Normal sleep patterns are often interrupted with periods of inability to sleep (insomnia). This may last for 6 months. Because of this discomfort, many people choose to continue drinking to get rid of this discomfort and to try to feel normal. Severe Withdrawal with Decreased or No Alcohol Intake, When Addiction or Chemical Dependency Has Developed About five percent of alcoholics will develop signs of severe withdrawal when they stop using alcohol. One sign of this is development of generalized seizures (convulsions). Other signs of this are severe agitation and confusion. This may be associated with believing in things which are not real or seeing things which are not really there (delusions and hallucinations). Vitamin deficiencies are usually present if alcohol intake has been long-term. Treatment for this most often requires hospitalization and close observation. Addiction can only be helped by stopping use of all chemicals. This is hard but may save your life. With continual alcohol use, possible outcomes are usually loss of self respect and esteem, violence, and death. Addiction cannot be cured but it can be stopped. This often requires outside help and the care of professionals. Treatment centers are listed in the yellow pages under Cocaine, Narcotics, and Alcoholics Anonymous. Most hospitals and clinics can refer you to a specialized care center. It is not necessary for you to go through the uncomfortable symptoms of withdrawal. Your caregiver can  provide you with medicines that will help you through this difficult period. Try to avoid situations, friends, or drugs that made it possible for you to keep using alcohol in the past. Learn how to say no. It takes a long period of time to overcome addictions to all drugs, including alcohol. There may be many times when you feel as though you want a drink. After getting rid of the physical addiction and withdrawal, you will have a lessening of the craving which tells you that you need alcohol to feel normal. Call your caregiver if more support is needed. Learn who to talk to in your family and among your friends so that during these periods you can receive outside help. Alcoholics Anonymous (AA) has helped many people over the years. To get further help, contact AA or call your caregiver, counselor, or clergyperson. Al-Anon and Alateen are support groups for friends and family members of an alcoholic. The people who love and care for an alcoholic often need help, too. For information about these organizations, check your phone directory or call a local alcoholism treatment center.  SEEK IMMEDIATE MEDICAL CARE IF:   You have a seizure.  You have a fever.  You experience uncontrolled vomiting or you vomit up blood. This may be bright red or look like black coffee grounds.  You have blood in the stool. This may be bright red or appear as a black, tarry, bad-smelling stool.  You become lightheaded or faint. Do not drive if you feel this way. Have someone else drive you or call [address] for help.  You become more agitated or confused.  You develop uncontrolled anxiety.  You begin to see things that are  not really there (hallucinate). °Your caregiver has determined that you completely understand your medical condition, and that your mental state is back to normal. You understand that you have been treated for alcohol withdrawal, have agreed not to drink any alcohol for a minimum of 1 day, will not operate a  car or other machinery for 24 hours, and have had an opportunity to ask any questions about your condition. °Document Released: 07/25/2005 Document Revised: 01/07/2012 Document Reviewed: 06/02/2008 °ExitCare® Patient Information ©2015 ExitCare, LLC. This information is not intended to replace advice given to you by your health care provider. Make sure you discuss any questions you have with your health care provider. ° ° °Emergency Department Resource Guide °1) Find a Doctor and Pay Out of Pocket °Although you won't have to find out who is covered by your insurance plan, it is a good idea to ask around and get recommendations. You will then need to call the office and see if the doctor you have chosen will accept you as a new patient and what types of options they offer for patients who are self-pay. Some doctors offer discounts or will set up payment plans for their patients who do not have insurance, but you will need to ask so you aren't surprised when you get to your appointment. ° °2) Contact Your Local Health Department °Not all health departments have doctors that can see patients for sick visits, but many do, so it is worth a call to see if yours does. If you don't know where your local health department is, you can check in your phone book. The CDC also has a tool to help you locate your state's health department, and many state websites also have listings of all of their local health departments. ° °3) Find a Walk-in Clinic °If your illness is not likely to be very severe or complicated, you may want to try a walk in clinic. These are popping up all over the country in pharmacies, drugstores, and shopping centers. They're usually staffed by nurse practitioners or physician assistants that have been trained to treat common illnesses and complaints. They're usually fairly quick and inexpensive. However, if you have serious medical issues or chronic medical problems, these are probably not your best  option. ° °No Primary Care Doctor: °- Call Health Connect at  832-8000 - they can help you locate a primary care doctor that  accepts your insurance, provides certain services, etc. °- Physician Referral Service- 1-800-533-3463 ° °Chronic Pain Problems: °Organization         Address  Phone   Notes  °[facility]  [phone] Patients need to be referred by their primary care doctor.  ° °Medication Assistance: °Organization         Address  Phone   Notes  °[place] [facility] [address] °[place], [place] [address] [phone] --Must be a resident of [place] °-- Must have NO insurance coverage whatsoever (no Medicaid/ Medicare, etc.) °-- The pt. MUST have a primary care doctor that directs their care regularly and follows them in the community °  °MedAssist  (866) 331-1348   °United Way  (888) 892-1162   ° °Agencies that provide inexpensive medical care: °Organization         Address  Phone   Notes  °[facility]  [phone]   °[facility]    [phone]   °[facility] [address]   [address] [place], [place] [address] [phone]   [facility] [place] [address], [place] [phone]   [facility]anned Parenthood    [phone](336) [phone]   [facility]    [phone]   [facility]  [address], [place] Phone:  [phone], Fax:  [phone] Hours of Operation:  9 am - 6 pm, M-F.  Also accepts Medicaid/Medicare and self-pay.  [facility]  [address], [place] Phone: [phone]) [phone], Fax: [phone]. Hours of Operation:  8:30 am - 5:30 pm, M-F.  Also accepts Medicaid and self-pay.  [facility] [place] [address], [place] Phone: [phone]) [phone]   [facility] [address], [place], [place] [phone], Ext. [phone] Mondays & Thursdays: 7-9 AM.  First 15  patients are seen on a first come, first serve basis.    Medicaid-accepting [facility] Providers:  Organization         Address  Phone   Notes  [facility] [address], [place] [phone](336) [phone] Also accepts self-pay patients.  [facility] [address], [place]  [phone]) [phone]   [facility] [address], [place] [phone]) [phone]   [facility] [address] [place] [address], [place] [phone]) [phone]   Renaye RakersVeita Bland [address], [place]   [phone] Only accepts [place] Access [place] patients after they have their name applied to their card.   Self-Pay (no insurance) in [facility] [place]:  Organization         Address  Phone   Notes  Sickle Cell Patients, [facility] [address], [place] [phone]) [phone]   [facility] [address], [place] [phone]) [phone]   [facility]  [address], [place] [phone]   [facility]/Dr. Osei-Bonsu  [address] [place] [address], [place] or [phone], [place] [phone] Phone number for both [place] and [place] locations is the same.  [facility] [address], [place] [phone](336) [phone]   [facility] [address] [place] [address], [place] or [address] [phone]   [facility] [address], [place] [phone]) [phone], phone; [phone]) [phone], fax Sees patients 1st and 3rd Saturday of every month.  Must not qualify for public or private insurance (i.e. Medicaid, Medicare, [place] Health Choice, Veterans' Benefits)  Household income should be no more than 200% of the poverty level The clinic cannot treat you if you are pregnant or think you are pregnant  Sexually transmitted diseases are not treated at the clinic.    Dental  Care: Organization         Address  Phone  Notes  [facility] of [facility] [address], [place] [phone]) [phone] Accepts children up to age 33 who are enrolled in [place]icaid or [place] Health Choice; pregnant women with a Medicaid card; and children who have applied for Medicaid or [place] Health Choice, but were declined, whose parents can pay a reduced fee at time of service.  [facility] [place]  [address], [place] [phone]) [phone] Accepts children up to age 33 who are enrolled in [place]icaid or [place] Health  Choice; pregnant women with a Medicaid card; and children who have applied for Medicaid or [place] Health Choice, but were declined, whose parents can pay a reduced fee at time of service.  [facility]  [address], [place] [phone]) [phone] Patients are seen by appointment only. Walk-ins are not accepted. [facility] will see patients 33 years of age and older. Monday - Tuesday (8am-5pm) Most Wednesdays (8:30-5pm) $30 per visit, cash only  [facility]  [address], [facility] [phone] Patients are seen by appointment only. Walk-ins are not accepted. [facility] will see patients 33 years of age and older. One Wednesday Evening (Monthly: Volunteer Based).  $30 per visit, cash only  [facility]  [phone] for adults; Children under age 364, call Graduate Pediatric Dentistry at [phone]. Children aged 754-14, please call [phone] to request a pediatric application.  Dental services are provided in all areas of dental care including fillings, crowns and bridges, complete and partial dentures, implants, gum treatment, root canals, and extractions. Preventive care is also provided. Treatment is provided to both adults and children. Patients are selected via a lottery and there is often a waiting list.   [facility] [address], [place]  [phone]) [phone] [email or web address]   [facility] [address], [place], [place] [phone], Ext. [phone] Second and Fourth Thursday of each month, opens at 6:30 AM; Clinic ends at 9 AM.  Patients are seen on a first-come first-served basis, and a limited number are seen during each clinic.   [facility]  [address], [place], [place] [phone]) [phone]   Eligibility Requirements You must have lived in [place], or [place] counties for at least the last three months.   You cannot be eligible for state or federal sponsored National Cityhealthcare insurance, including CIGNAVeterans Administration, [place]icaid, or Harrah's EntertainmentMedicare.   You generally cannot be eligible for healthcare insurance through your employer.    How to apply: Eligibility screenings are held every Tuesday and Wednesday afternoon from 1:00 pm until 4:00 pm. You do not need an appointment for the interview!  [facility] [address], [place], [place] [phone]   [facility]  [phone]   [facility]  [phone]   [facility]  [phone]    Behavioral Health Resources in the Community: Intensive Outpatient Programs Organization         Address  Phone  Notes  [facility] [address], [place], [place] [phone]   [facility] [address], [place], [place] [phone]   [facility] [address], [place], [place]  [phone]   [facility] [address],  [place], [place] [phone] or [phone]   Substance Abuse Resources Organization         Address  Phone  Notes  [facility] Drug Services  [phone]   [facility]  [phone]   [facility]  [phone]   [facility]  [phone]   Residential & Outpatient Substance Abuse Program  [phone]    Psychological Services Organization         Address  Phone  Notes  [facility]  [phone]- [phone]   [facility]  [phone]- [phone]   [facility] [address], [place] [phone] or [phone]  Mobile Crisis Teams Organization         Address  Phone  Notes  [facility], Mobile Crisis Care Unit  [phone]   [facility]  [address] [place], [place]   [place] Levels [address] [place] [phone]    Self-Help/Support Groups Organization         Address  Phone             Notes  [place] of [place] - variety of support groups  [place] Call for more information  Narcotics Anonymous (NA), [facility] [address], [employer] [place] [place]  2 meetings at this location   Special educational needs teacher         Address  Phone  Notes  [facility] [place],    [place]  [phone]   [facility]  [address] [place] [address]67, [place], [place]   [facility], [place] [phone] Admissions: 8am-3pm M-F  Incentives Substance [place] [address]    [place], [place] [phone]   [facility] [address] [place], [place], [place]   [facility] [address]  [place], [place]   [facility] - Intensive Outpatient [address], [place], [place]   [facility] ([place] [place].,  [place], [place] [phone] or [phone]   [facility] (RTS) [address] [place], [place] Accepts Medicaid  [facility] [place] [address]  [place] [phone] Substance Abuse/Addiction Treatment   [facility] Organization         Address  Phone  Notes  [facility]  [phone]   Domenic Schwab, PhD [address] [place], [place]   [phone] or [phone]   [place], [place] [phone]   [facility] [address], [place], [place] [phone] Insurance/Medicaid/sponsorship through [facility] and Families [address], [address]                                    [place], [place] [phone] [place] [address], [place] [phone]    Dr. Adele Schilder  [phone]   [facility]. 1) [address], [place] 2) [place] 3)  [place] [address], [place] [phone]  [phone]   [place] [phone] or [phone] (After Hours)

## 2014-05-27 NOTE — ED Notes (Signed)
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 Pt last drank about 7pm. Pt states she drinks about a half gallon of vodka a day. Pt states she had about 12 shots of vodka. Pt reports she wants help today because she is tired of the way she feels when she drinks. Pt states she has been vomiting, diarrhea, shakes, headache.

## 2014-05-27 NOTE — ED Notes (Signed)
Patient is alert and orientedx4.  Patient was explained discharge instructions and they understood them with no questions.  The patient's parents, Mellody Drown and Marvia Pickles are taking her home.

## 2014-05-27 NOTE — ED Notes (Signed)
Pt. Is aware of needing a urine specimen 

## 2014-05-27 NOTE — ED Notes (Signed)
Pt. Is nauseated medicated per protocol

## 2014-05-27 NOTE — ED Notes (Signed)
Family at bedside. 

## 2014-05-27 NOTE — ED Notes (Signed)
Validated some vitals that are not valid.  Disregard the vitals validated for 2100.

## 2014-05-27 NOTE — Discharge Summary (Signed)
Case discussed, agree with plan 

## 2014-05-27 NOTE — ED Notes (Addendum)
Patient is having TTS done.

## 2014-05-27 NOTE — BH Assessment (Signed)
Completed assessment. Pt requested parents stay for interview. Pt and family report concerns for costs and pt expressed concerns over not being able to see her mom with limited visitation due to construction. Informed pt she would not likely be able to have unlimited visitation at any tx facility.  Pt and family report they would like pt to detox at their house with the help a of a nurse friend and would like to know if she can be prescribed medication. Informed pt and family this writer would consult medical team and have treatment options presented to them in a timely fashion. Pt reports she would like to take time to discuss options with her family.   Called to speak with nurse Doran Heater, RN working with Pt. Indicated this writer would not be able to get disposition recommendations without the labs results being in. Doran Heater reports she believes labs are back. Lab results are not present in the computer that this writer can see. Nurse will check for results and contact this writer back at [phone] at her earliest availability.   Clista Bernhardt, [facility] Triage Specialist 05/27/2014 10:43 PM

## 2014-05-27 NOTE — ED Provider Notes (Signed)
CSN: [id number]     Arrival date & time 05/27/14  1823 History   First MD Initiated Contact with Patient 05/27/14 2016     Chief Complaint  Patient presents with  . Drug / Alcohol Assessment    nausea and vomiting and black stools     (Consider location/radiation/quality/duration/timing/severity/associated sxs/prior Treatment) HPI Comments: Patient was discharged from behavioral health hospital.  Yesterday.  She was admitted for alcohol abuse.  She's been an alcoholic for the past persist or she states she was sober.  On discharge for several hours and then became frustrated and started drinking.  Patient presents again today requesting help with call if he has been persistently nauseated, and having a headache.  She does not go to a, meeting, she's never sought counseling for her traumatic event that triggered the alcohol problem.  She is accompanied to the emergency department by her mother, who appears to be enabling the addiction and making excuses for the patient. Patient and mother, states that when she was discharged from behavioral health hospital.  Yesterday.  She was not given any resources that she could "Ford a wall $500 or more.  She was not given any medication.  Prescriptions for Zofran or Ativan or Librium  Patient is a 33 y.o. female presenting with drug/alcohol assessment. The history is provided by the patient.  Drug / Alcohol Assessment Similar prior episodes: yes   Severity:  Severe Timing:  Constant Progression:  Worsening Chronicity:  Chronic Suspected agents:  Alcohol Associated symptoms: abdominal pain, headaches and nausea   Associated symptoms: no suicidal ideation and no vomiting   Risk factors: addiction treatment   Risk factors: no recent infection and no withdrawal syndrome     Past Medical History  Diagnosis Date  . Acid reflux   . Asthma   . History of blood transfusion   . Multiple sclerosis 2005    diagnosed in [place], failed steroids, no other  medication  . Anxiety   . Gastric ulcer   . Migraine   . Chronic nausea     self reported  . Chronic diarrhea     self reported  . Chronic pain     self reported chronic pain, back pain, fibromyalgia  . Insomnia    Past Surgical History  Procedure Laterality Date  . Gastric bypass    . Abdominal surgery  2003    weight loss of over 200 lbs  . Lumbar laminectomy/decompression microdiscectomy Right 04/14/2014    Procedure: LUMBAR LAMINECTOMY/DECOMPRESSION MICRODISCECTOMY LEVEL L4-5;  Surgeon: Maeola Harman, MD;  Location: [facility];  Service: Neurosurgery;  Laterality: Right;  LUMBAR LAMINECTOMY/DECOMPRESSION MICRODISCECTOMY LEVEL L4-5   Family History  Problem Relation Age of Onset  . Hypertension Paternal Grandfather   . Colon cancer Paternal Grandfather 52  . Asthma Paternal Grandmother   . Hypertension Paternal Grandmother   . Breast cancer Paternal Grandmother   . Cancer Paternal Grandmother     lung   . Diabetes Maternal Grandmother   . Diabetes Maternal Grandfather   . Hypertension Father   . Urinary tract infection Mother   . Stroke Mother 71    3 strokes    History  Substance Use Topics  . Smoking status: Current Some Day Smoker -- 0.10 packs/day    Types: Cigarettes    Last Attempt to Quit: 05/14/2011  . Smokeless tobacco: Never Used  . Alcohol Use: 1.2 oz/week    1 Cans of beer, 1 Shots of liquor per week  Comment: Says she can drink up to a 1/2 gallon/day   OB History   Grav Para Term Preterm Abortions TAB SAB Ect Mult Living   0         0     Review of Systems  Constitutional: Negative for fever.  Gastrointestinal: Positive for nausea and abdominal pain. Negative for vomiting.  Neurological: Positive for headaches. Negative for dizziness.  Psychiatric/Behavioral: Positive for sleep disturbance. Negative for suicidal ideas and self-injury.  All other systems reviewed and are negative.     Allergies  Cymbalta; Dilaudid; and Prednisone  Home  Medications   Prior to Admission medications   Medication Sig Start Date End Date Taking? Authorizing Provider  diazepam (VALIUM) 5 MG tablet Take 1 tablet (5 mg total) by mouth every 6 (six) hours as needed for muscle spasms. 04/16/14  Yes Maeola HarmanJoseph Stern, MD  HYDROcodone-acetaminophen (NORCO) 7.5-325 MG per tablet Take 1 tablet by mouth every 6 (six) hours as needed for moderate pain.   Yes Historical Provider, MD  chlordiazePOXIDE (LIBRIUM) 25 MG capsule Take 1 capsule (25 mg total) by mouth 4 (four) times daily as needed for withdrawal. 05/27/14   Arman FilterGail K Hitoshi Werts, NP  ondansetron (ZOFRAN-ODT) 8 MG disintegrating tablet Take 0.5 tablets (4 mg total) by mouth every 6 (six) hours as needed for nausea or vomiting. 05/27/14   Arman FilterGail K Latesia Norrington, NP   BP 93/65  Pulse 78  Temp(Src) 98.3 F (36.8 C) (Oral)  Resp 16  Ht 5\' 6"  (1.676 m)  Wt 240 lb (108.863 kg)  BMI 38.76 kg/m2  SpO2 99%  LMP 02/26/2014 Physical Exam  Nursing note and vitals reviewed. Constitutional: She is oriented to person, place, and time. She appears well-developed and well-nourished.  HENT:  Head: Normocephalic.  Eyes: Pupils are equal, round, and reactive to light.  Neck: Normal range of motion.  Cardiovascular: Normal rate and regular rhythm.   Pulmonary/Chest: Effort normal and breath sounds normal.  Abdominal: Soft. Bowel sounds are normal.  Musculoskeletal: Normal range of motion.  Neurological: She is alert and oriented to person, place, and time.  Skin: Skin is warm. No rash noted. No erythema.    ED Course  Procedures (including critical care time) Labs Review Labs Reviewed  CBC - Abnormal; Notable for the following:    Hemoglobin 9.5 (*)    HCT 32.0 (*)    MCV 71.7 (*)    MCH 21.3 (*)    MCHC 29.7 (*)    RDW 18.5 (*)    Platelets 402 (*)    All other components within normal limits  COMPREHENSIVE METABOLIC PANEL - Abnormal; Notable for the following:    BUN 3 (*)    AST 70 (*)    ALT 43 (*)    Total  Bilirubin 0.2 (*)    Anion gap 19 (*)    All other components within normal limits  ETHANOL - Abnormal; Notable for the following:    Alcohol, Ethyl (B) 123 (*)    All other components within normal limits  SALICYLATE LEVEL - Abnormal; Notable for the following:    Salicylate Lvl <2.0 (*)    All other components within normal limits  ACETAMINOPHEN LEVEL  URINE RAPID DRUG SCREEN (HOSP PERFORMED)    Imaging Review No results found.   EKG Interpretation None      MDM  Patient is refusing admission to the hospital record requesting that she detox on her and parents.  At the bedside to say they will  monitor her medications.  They have a nurse to be able to stay with her 24 7 for the next several days.  She will followup with her primary care physician.  She has been assessed by TTS.  She is not suicidal or homicidal.  I will send this patient home with Librium 25 mg every 6 hours for 5 days.  Zofran every 6 hours for the next several days and given her outpatient resources.  Hopefully to find the alcohol treatment program Final diagnoses:  Alcohol abuse  Alcohol withdrawal, uncomplicated         Arman Filter, NP 05/27/14 2322

## 2014-05-27 NOTE — BH Assessment (Signed)
Attempted to call TA equipment and it did not go through. Will attempt again at 10:10 pm to allow nurse more time to have machine set up.  Clista Bernhardt, [facility] Triage Specialist 05/27/2014 10:06 PM

## 2014-05-27 NOTE — BH Assessment (Addendum)
Called to request TA equipment, was told by Renae Fickle, RN that pt is in pod E. Pod E nurse was contacted and reports she will set up machine.  Spoke with Earley Favor, medically clear and can be assessed. She report Pt was upset that she was in Obs with a pt who wanted to kill himself, and can not afford to go to the referrals given to her. Per Dondra Spry, NP if pt is a candidate for 300 hall bed she would be in agreement with that. Per Dondra Spry, NP pt is medically cleared.   Checked with Delorise Jackson, [facility] who reports there are female 300 hall beds available if pt is appropriate.   TA to commence shortly.   Clista Bernhardt, [facility] Triage Specialist 05/27/2014 9:59 PM

## 2014-05-27 NOTE — H&P (Signed)
Patient admitted to OBS for detox. 

## 2014-05-28 NOTE — ED Provider Notes (Signed)
Medical screening examination/treatment/procedure(s) were performed by non-physician practitioner and as supervising physician I was immediately available for consultation/collaboration.   EKG Interpretation None        Richardean Canalavid H Yao, MD 05/28/14 1721

## 2014-06-28 ENCOUNTER — Emergency Department (HOSPITAL_COMMUNITY): Payer: BC Managed Care – PPO

## 2014-06-28 ENCOUNTER — Emergency Department (HOSPITAL_COMMUNITY)
Admission: EM | Admit: 2014-06-28 | Discharge: 2014-06-28 | Disposition: A | Discharge status: Home / Self Care | Payer: Self-pay | Attending: Emergency Medicine | Admitting: Emergency Medicine

## 2014-06-28 ENCOUNTER — Encounter (HOSPITAL_COMMUNITY): Payer: Self-pay | Admitting: Emergency Medicine

## 2014-06-28 DIAGNOSIS — F411 Generalized anxiety disorder: Secondary | ICD-10-CM | POA: Insufficient documentation

## 2014-06-28 DIAGNOSIS — M545 Low back pain, unspecified: Secondary | ICD-10-CM

## 2014-06-28 DIAGNOSIS — Y9389 Activity, other specified: Secondary | ICD-10-CM | POA: Insufficient documentation

## 2014-06-28 DIAGNOSIS — M25572 Pain in left ankle and joints of left foot: Secondary | ICD-10-CM

## 2014-06-28 DIAGNOSIS — G8929 Other chronic pain: Secondary | ICD-10-CM | POA: Insufficient documentation

## 2014-06-28 DIAGNOSIS — Z8719 Personal history of other diseases of the digestive system: Secondary | ICD-10-CM | POA: Insufficient documentation

## 2014-06-28 DIAGNOSIS — X500XXA Overexertion from strenuous movement or load, initial encounter: Secondary | ICD-10-CM | POA: Insufficient documentation

## 2014-06-28 DIAGNOSIS — F17200 Nicotine dependence, unspecified, uncomplicated: Secondary | ICD-10-CM | POA: Insufficient documentation

## 2014-06-28 DIAGNOSIS — W19XXXA Unspecified fall, initial encounter: Secondary | ICD-10-CM

## 2014-06-28 DIAGNOSIS — Z79899 Other long term (current) drug therapy: Secondary | ICD-10-CM | POA: Insufficient documentation

## 2014-06-28 DIAGNOSIS — Y9289 Other specified places as the place of occurrence of the external cause: Secondary | ICD-10-CM | POA: Insufficient documentation

## 2014-06-28 DIAGNOSIS — S99919A Unspecified injury of unspecified ankle, initial encounter: Principal | ICD-10-CM

## 2014-06-28 DIAGNOSIS — Z8669 Personal history of other diseases of the nervous system and sense organs: Secondary | ICD-10-CM | POA: Insufficient documentation

## 2014-06-28 DIAGNOSIS — IMO0002 Reserved for concepts with insufficient information to code with codable children: Secondary | ICD-10-CM | POA: Insufficient documentation

## 2014-06-28 DIAGNOSIS — J45909 Unspecified asthma, uncomplicated: Secondary | ICD-10-CM | POA: Insufficient documentation

## 2014-06-28 DIAGNOSIS — S99929A Unspecified injury of unspecified foot, initial encounter: Principal | ICD-10-CM

## 2014-06-28 DIAGNOSIS — S8990XA Unspecified injury of unspecified lower leg, initial encounter: Secondary | ICD-10-CM | POA: Insufficient documentation

## 2014-06-28 MED ORDER — OXYCODONE-ACETAMINOPHEN 5-325 MG PO TABS: 2.0000 | ORAL_TABLET | ORAL | Status: DC | PRN

## 2014-06-28 MED ORDER — OXYCODONE-ACETAMINOPHEN 5-325 MG PO TABS
2.0000 | ORAL_TABLET | Freq: Once | ORAL | Status: AC
  Administered 2014-06-28: 2 via ORAL
  Filled 2014-06-28: qty 2

## 2014-06-28 NOTE — ED Notes (Signed)
Patient transported to X-ray 

## 2014-06-28 NOTE — ED Notes (Signed)
Pt reports having a fall on Saturday night while dancing. Now having pain to left ankle and lower back.

## 2014-06-28 NOTE — Discharge Instructions (Signed)
Return to the emergency room with worsening of symptoms, new symptoms or with symptoms that are concerning, severe worsening pain, fevers, warmth, swelling or red skin streaks.  RICE: Rest, Ice (three cycles of 20 mins on, off at least twice a day), compression/brace, elevation. Ibuprofen  (4 tablets ) three times a day for 3-5 days and then as needed for pain. Narcotics for severe pain. Do not operate machinery or drive while taking narcotics or muscle relaxants. Compression is the ASO (ankle brace) and crutches for comfort. Advance to walking as you can tolerate.   Follow up with Dr. Venetia Maxon if symptoms worsen or are persistent. Please call your doctor for a follow up appointment within 24 to 48 hours and please let them know you were seen in the Emergency Department. Have them acquire all of your records so that they can discuss the findings with you and formulate a treatment plan to fully care for your new and ongoing medical problems.   Ankle Pain Ankle pain is a common symptom. The bones, cartilage, tendons, and muscles of the ankle joint perform a lot of work each day. The ankle joint holds your body weight and allows you to move around. Ankle pain can occur on either side or back of 1 or both ankles. Ankle pain may be sharp and burning or dull and aching. There may be tenderness, stiffness, redness, or warmth around the ankle. The pain occurs more often when a person walks or puts pressure on the ankle. CAUSES  There are many reasons ankle pain can develop. It is important to work with your caregiver to identify the cause since many conditions can impact the bones, cartilage, muscles, and tendons. Causes for ankle pain include:  Injury, including a break (fracture), sprain, or strain often due to a fall, sports, or a high-impact activity.  Swelling (inflammation) of a tendon (tendonitis).  Achilles tendon rupture.  Ankle instability after repeated sprains and  strains.  Poor foot alignment.  Pressure on a nerve (tarsal tunnel syndrome).  Arthritis in the ankle or the lining of the ankle.  Crystal formation in the ankle (gout or pseudogout). DIAGNOSIS  A diagnosis is based on your medical history, your symptoms, results of your physical exam, and results of diagnostic tests. Diagnostic tests may include X-ray exams or a computerized magnetic scan (magnetic resonance imaging, MRI). TREATMENT  Treatment will depend on the cause of your ankle pain and may include:  Keeping pressure off the ankle and limiting activities.  Using crutches or other walking support (a cane or brace).  Using rest, ice, compression, and elevation.  Participating in physical therapy or home exercises.  Wearing shoe inserts or special shoes.  Losing weight.  Taking medications to reduce pain or swelling or receiving an injection.  Undergoing surgery. HOME CARE INSTRUCTIONS   Only take over-the-counter or prescription medicines for pain, discomfort, or fever as directed by your caregiver.  Put ice on the injured area.  Put ice in a plastic bag.  Place a towel between your skin and the bag.  Leave the ice on for 15-20 minutes at a time, 03-04 times a day.  Keep your leg raised (elevated) when possible to lessen swelling.  Avoid activities that cause ankle pain.  Follow specific exercises as directed by your caregiver.  Record how often you have ankle pain, the location of the pain, and what it feels like. This information may be helpful to you and your caregiver.  Ask your caregiver about returning  to work or sports and whether you should drive.  Follow up with your caregiver for further examination, therapy, or testing as directed. SEEK MEDICAL CARE IF:   Pain or swelling continues or worsens beyond 1 week.  You have an oral temperature above 102 F (38.9 C).  You are feeling unwell or have chills.  You are having an increasingly difficult  time with walking.  You have loss of sensation or other new symptoms.  You have questions or concerns. MAKE SURE YOU:   Understand these instructions.  Will watch your condition.  Will get help right away if you are not doing well or get worse. Document Released: 04/04/2010 Document Revised: 01/07/2012 Document Reviewed: 04/04/2010 [facility] Patient Information 2015 [place], [place]. This information is not intended to replace advice given to you by your health care provider. Make sure you discuss any questions you have with your health care provider.

## 2014-06-28 NOTE — ED Provider Notes (Signed)
Beth [name] is a 33 y.o. female presenting after a mechanical fall on Saturday where she twisted her left ankle and fell on her lower back. She had back surgery two months ago for a herniated disc. Patient signed out at shift change by Trixie Dredge PA-C. Plan is to f/u xray and discharge with pain control and follow up. Please refer to previous note for full HPI, ROS, PMH and PE.    PE Physical Exam  Constitutional: She is well-developed, well-nourished, and in no distress. No distress.  HENT:  Head: Normocephalic and atraumatic.  Eyes: Conjunctivae are normal. Right eye exhibits no discharge. Left eye exhibits no discharge.  Pulmonary/Chest: No respiratory distress.  Musculoskeletal:  Edema and erythema to right ankle. Tender to palpation. Neurovascularly intact. Tenderness to palpation of lumbar spine without crepitus or step off.  Neurological: She is alert.  Skin: Skin is warm and dry. She is not diaphoretic.      Dg Lumbar Spine Complete  06/28/2014   CLINICAL DATA:  Fall 2 days ago. Low back pain. Prior back surgery on June 17th.  EXAM: LUMBAR SPINE - COMPLETE 4+ VIEW  COMPARISON:  Multiple exams, including 04/14/2014 and 04/13/2014  FINDINGS: Reduced intervertebral disc height at the L4-5 level. No significant malalignment, fracture, or bony abnormality. Faint chronic densities along the upper abdomen and pelvis, unchanged from multiple prior exams and considered incidental.  IMPRESSION: 1. Mild loss of disc height at L4-5 compatible with degenerative disc disease. No acute bony findings.   Electronically Signed   By: Herbie Baltimore M.D.   On: 06/28/2014 17:33   Dg Tibia/fibula Left  06/28/2014   CLINICAL DATA:  LEFT leg pain, fell 2 days ago  EXAM: LEFT TIBIA AND FIBULA - 2 VIEW  COMPARISON:  None  FINDINGS: Non fused ossicles at tibial tubercle.  Knee and ankle joint alignments normal.  Osseous mineralization normal for technique.  No acute fracture, dislocation or bone destruction.   IMPRESSION: No acute osseous abnormalities.   Electronically Signed   By: Ulyses Southward M.D.   On: 06/28/2014 17:30   Dg Ankle Complete Left  06/28/2014   CLINICAL DATA:  Fall 2 days ago with ankle swelling and pain.  EXAM: LEFT ANKLE COMPLETE - 3+ VIEW  COMPARISON:  None.  FINDINGS: There is no evidence of fracture, dislocation, or joint effusion.  IMPRESSION: No acute osseous findings.   Electronically Signed   By: Tiburcio Pea M.D.   On: 06/28/2014 17:29   Meds given in ED:  Medications  oxyCODONE-acetaminophen (PERCOCET/ROXICET) 5-325 MG per tablet 2 tablet (2 tablets Oral Given 06/28/14 1625)    Discharge Medication List as of 06/28/2014  5:58 PM    START taking these medications   Details  oxyCODONE-acetaminophen (PERCOCET/ROXICET) 5-325 MG per tablet Take 2 tablets by mouth every 4 (four) hours as needed for moderate pain or severe pain., Starting 06/28/2014, Until Discontinued, Print          MDM Beth [name] is a 33 y.o. female complaining of left ankle pain and lower back pain after a mechanical fall on Saturday. Xray of ankle without fracture, dislocation or joint effusion. Xray of lumbar spine without fracture or significant malalignment. Mild loss of disc height at L4-L5 compatible with degenerative disc disease. Patient given percocet in ED with improvement of symptoms. No red flags for back pain. Patient given ASO and crutches for comfort only. Advance to weightbearing as tolerated. Tx with RICE, NSAIDs and short course of percocet.  Follow up with PCP if ankle pain gets worse or is persistent. Follow up with Dr. Venetia Maxon, neurosurgeon for persistent back pain.  Discussed return precautions with patient. Discussed all results and patient verbalizes understanding and agrees with plan.       Louann Sjogren, PA-C 06/29/14 1133

## 2014-06-28 NOTE — ED Provider Notes (Signed)
CSN: [id number]     Arrival date & time 06/28/14  1523 History  This chart was scribed for non-physician practitioner, Trixie Dredge, PA-C working with Gerhard Munch, MD by Greggory Stallion, ED scribe. This patient was seen in room TR11C/TR11C and the patient's care was started at 4:02 PM.  Chief Complaint  Patient presents with  . Ankle Pain  . Back Pain   The history is provided by the patient. No language interpreter was used.   HPI Comments:  Beth [name] is a 33 y.o. female who presents to the Emergency Department complaining of a fall that occurred early yesterday morning. Pt twisted her ankle, fell down and landed on her back. Reports sudden onset lower back pain and sharp left ankle pain that shoots up her leg with associated swelling. Back pain does not radiate. Bearing weight worsens the ankle pain and certain movements worsen her back pain. Pt has taken tylenol, aleve and ibuprofen with no relief. She has also iced and elevated her ankle with no relief. Pt had back surgery about 2 months ago. Denies fever, abdominal pain, bowel or bladder incontinence.   Past Medical History  Diagnosis Date  . Acid reflux   . Asthma   . History of blood transfusion   . Multiple sclerosis 2005    diagnosed in [place], failed steroids, no other medication  . Anxiety   . Gastric ulcer   . Migraine   . Chronic nausea     self reported  . Chronic diarrhea     self reported  . Chronic pain     self reported chronic pain, back pain, fibromyalgia  . Insomnia    Past Surgical History  Procedure Laterality Date  . Gastric bypass    . Abdominal surgery  2003    weight loss of over 200 lbs  . Lumbar laminectomy/decompression microdiscectomy Right 04/14/2014    Procedure: LUMBAR LAMINECTOMY/DECOMPRESSION MICRODISCECTOMY LEVEL L4-5;  Surgeon: Maeola Harman, MD;  Location: [facility];  Service: Neurosurgery;  Laterality: Right;  LUMBAR LAMINECTOMY/DECOMPRESSION MICRODISCECTOMY LEVEL L4-5   Family History   Problem Relation Age of Onset  . Hypertension Paternal Grandfather   . Colon cancer Paternal Grandfather 72  . Asthma Paternal Grandmother   . Hypertension Paternal Grandmother   . Breast cancer Paternal Grandmother   . Cancer Paternal Grandmother     lung   . Diabetes Maternal Grandmother   . Diabetes Maternal Grandfather   . Hypertension Father   . Urinary tract infection Mother   . Stroke Mother 75    3 strokes    History  Substance Use Topics  . Smoking status: Current Some Day Smoker -- 0.10 packs/day    Types: Cigarettes    Last Attempt to Quit: 05/14/2011  . Smokeless tobacco: Never Used  . Alcohol Use: 1.2 oz/week    1 Cans of beer, 1 Shots of liquor per week     Comment: Says she can drink up to a 1/2 gallon/day   OB History   Grav Para Term Preterm Abortions TAB SAB Ect Mult Living   0         0     Review of Systems  Constitutional: Negative for fever.  Gastrointestinal: Negative for abdominal pain.  Genitourinary:       Negative for bowel or bladder incontinence.  Musculoskeletal: Positive for arthralgias, back pain and joint swelling.  All other systems reviewed and are negative.   Allergies  Cymbalta; Dilaudid; and Prednisone  Home  Medications   Prior to Admission medications   Medication Sig Start Date End Date Taking? Authorizing Provider  chlordiazePOXIDE (LIBRIUM) 25 MG capsule Take 1 capsule (25 mg total) by mouth 4 (four) times daily as needed for withdrawal. 05/27/14   Arman Filter, NP  diazepam (VALIUM) 5 MG tablet Take 1 tablet (5 mg total) by mouth every 6 (six) hours as needed for muscle spasms. 04/16/14   Maeola Harman, MD  HYDROcodone-acetaminophen (NORCO) 7.5-325 MG per tablet Take 1 tablet by mouth every 6 (six) hours as needed for moderate pain.    Historical Provider, MD  ondansetron (ZOFRAN-ODT) 8 MG disintegrating tablet Take 0.5 tablets (4 mg total) by mouth every 6 (six) hours as needed for nausea or vomiting. 05/27/14   Arman Filter, NP   Triage Vitals: BP 128/69  Pulse 87  Temp(Src) 98.4 F (36.9 C) (Oral)  Resp 18  SpO2 97% Physical Exam  Nursing note and vitals reviewed. Constitutional: She appears well-developed and well-nourished. No distress.  HENT:  Head: Normocephalic and atraumatic.  Neck: Neck supple.  Pulmonary/Chest: Effort normal.  Abdominal: Soft. There is no tenderness.  Musculoskeletal: She exhibits edema and tenderness.       Back:       Feet:  Diffuse tenderness without crepitus or step offs along lumbar spine with well healed surgical incision. Left lateral ankle is edematous and ecchymotic. Tender to palpation. No tenderness of foot. Full ROM of toes. Sensation intact. Capillary refill less than 3 seconds. Tenderness throughout the left lower leg.   Neurological: She is alert.  Skin: She is not diaphoretic.    ED Course  Procedures (including critical care time) DIAGNOSTIC STUDIES: Oxygen Saturation is 97% on RA, normal by my interpretation.   COORDINATION OF CARE: 4:04 PM-Discussed treatment plan which includes xray and pain medication with pt at bedside and pt agreed to plan.   Medications  oxyCODONE-acetaminophen (PERCOCET/ROXICET) 5-325 MG per tablet 2 tablet (not administered)    Labs Review Labs Reviewed - No data to display  Imaging Review No results found.   EKG Interpretation None      MDM   Final diagnoses:  Fall, initial encounter  Midline low back pain without sciatica  Left ankle pain    Patient with mechanical fall two nights ago, inversion injury, with left ankle swelling and pain.  Pain is over lateral ankle with radiation into lower leg.  Neurovascularly intact.  Lower back pain from fall, pt recently had surgery for herniated disc, Dr Venetia Maxon neurosurgery.  No red flags for back pain. Pt signed out at change of shift pending Xrays.  Percocet given in ED.    I personally performed the services described in this documentation, which was scribed  in my presence. The recorded information has been reviewed and is accurate.  4:10 PM Signed out patient to Karmen Stabs, PA-C, at change of shift.  Plan is for pain medication, xrays.  If negative, pt may be d/c home with pain medication, PCP follow up.     Trixie Dredge, PA-C 06/28/14 1622

## 2014-06-28 NOTE — ED Provider Notes (Signed)
  Medical screening examination/treatment/procedure(s) were performed by non-physician practitioner and as supervising physician I was immediately available for consultation/collaboration.   EKG Interpretation None         Sabena Winner, MD 06/28/14 2341 

## 2014-06-29 NOTE — ED Provider Notes (Signed)
  Medical screening examination/treatment/procedure(s) were performed by non-physician practitioner and as supervising physician I was immediately available for consultation/collaboration.   EKG Interpretation None         Gerhard Munch, MD 06/29/14 1845

## 2014-08-18 ENCOUNTER — Inpatient Hospital Stay (HOSPITAL_COMMUNITY): Payer: Self-pay | Admitting: Certified Registered"

## 2014-08-18 ENCOUNTER — Inpatient Hospital Stay (HOSPITAL_COMMUNITY)
Admission: EM | Admit: 2014-08-18 | Discharge: 2014-08-26 | DRG: 330 | Disposition: A | Discharge status: Home Health | Payer: Self-pay | Attending: Surgery | Admitting: Surgery

## 2014-08-18 ENCOUNTER — Encounter (HOSPITAL_COMMUNITY): Admission: EM | Disposition: A | Discharge status: Home Health | Payer: Self-pay | Source: Home / Self Care

## 2014-08-18 ENCOUNTER — Encounter (HOSPITAL_COMMUNITY): Payer: Self-pay | Admitting: Certified Registered"

## 2014-08-18 ENCOUNTER — Encounter (HOSPITAL_COMMUNITY): Payer: Self-pay | Admitting: Emergency Medicine

## 2014-08-18 ENCOUNTER — Emergency Department (HOSPITAL_COMMUNITY): Payer: Self-pay

## 2014-08-18 DIAGNOSIS — F17210 Nicotine dependence, cigarettes, uncomplicated: Secondary | ICD-10-CM | POA: Diagnosis present

## 2014-08-18 DIAGNOSIS — R1012 Left upper quadrant pain: Secondary | ICD-10-CM

## 2014-08-18 DIAGNOSIS — R32 Unspecified urinary incontinence: Secondary | ICD-10-CM | POA: Diagnosis present

## 2014-08-18 DIAGNOSIS — N390 Urinary tract infection, site not specified: Secondary | ICD-10-CM | POA: Diagnosis not present

## 2014-08-18 DIAGNOSIS — D62 Acute posthemorrhagic anemia: Secondary | ICD-10-CM | POA: Diagnosis not present

## 2014-08-18 DIAGNOSIS — G35 Multiple sclerosis: Secondary | ICD-10-CM | POA: Diagnosis present

## 2014-08-18 DIAGNOSIS — F419 Anxiety disorder, unspecified: Secondary | ICD-10-CM | POA: Diagnosis present

## 2014-08-18 DIAGNOSIS — K285 Chronic or unspecified gastrojejunal ulcer with perforation: Principal | ICD-10-CM | POA: Diagnosis present

## 2014-08-18 DIAGNOSIS — E876 Hypokalemia: Secondary | ICD-10-CM | POA: Diagnosis not present

## 2014-08-18 DIAGNOSIS — J45909 Unspecified asthma, uncomplicated: Secondary | ICD-10-CM | POA: Diagnosis present

## 2014-08-18 DIAGNOSIS — F1910 Other psychoactive substance abuse, uncomplicated: Secondary | ICD-10-CM | POA: Diagnosis present

## 2014-08-18 DIAGNOSIS — G4700 Insomnia, unspecified: Secondary | ICD-10-CM | POA: Diagnosis present

## 2014-08-18 DIAGNOSIS — K219 Gastro-esophageal reflux disease without esophagitis: Secondary | ICD-10-CM | POA: Diagnosis present

## 2014-08-18 DIAGNOSIS — D649 Anemia, unspecified: Secondary | ICD-10-CM | POA: Diagnosis not present

## 2014-08-18 DIAGNOSIS — K275 Chronic or unspecified peptic ulcer, site unspecified, with perforation: Secondary | ICD-10-CM | POA: Diagnosis present

## 2014-08-18 DIAGNOSIS — K631 Perforation of intestine (nontraumatic): Secondary | ICD-10-CM

## 2014-08-18 DIAGNOSIS — D72829 Elevated white blood cell count, unspecified: Secondary | ICD-10-CM

## 2014-08-18 DIAGNOSIS — K567 Ileus, unspecified: Secondary | ICD-10-CM | POA: Diagnosis not present

## 2014-08-18 DIAGNOSIS — F1010 Alcohol abuse, uncomplicated: Secondary | ICD-10-CM | POA: Diagnosis present

## 2014-08-18 DIAGNOSIS — R071 Chest pain on breathing: Secondary | ICD-10-CM

## 2014-08-18 DIAGNOSIS — Z9884 Bariatric surgery status: Secondary | ICD-10-CM

## 2014-08-18 DIAGNOSIS — K668 Other specified disorders of peritoneum: Secondary | ICD-10-CM | POA: Diagnosis present

## 2014-08-18 HISTORY — DX: Alcohol dependence with withdrawal delirium: F10.231

## 2014-08-18 HISTORY — DX: Alcohol abuse, uncomplicated: F10.10

## 2014-08-18 HISTORY — DX: Other psychoactive substance abuse, uncomplicated: F19.10

## 2014-08-18 HISTORY — DX: Alcohol use, unspecified with withdrawal delirium: F10.931

## 2014-08-18 HISTORY — PX: LAPAROTOMY: SHX154

## 2014-08-18 LAB — I-STAT BETA HCG BLOOD, ED (MC, WL, AP ONLY): I-stat hCG, quantitative: <5 m[IU]/mL (ref <5)

## 2014-08-18 LAB — CBC WITH DIFFERENTIAL/PLATELET
Basophils Absolute: 0 K/uL (ref 0.0–0.1)
Basophils Relative: 0 % (ref 0–1)
Eosinophils Absolute: 0 K/uL (ref 0.0–0.7)
Eosinophils Relative: 0 % (ref 0–5)
HCT: 33.8 % — ABNORMAL LOW (ref 36.0–46.0)
Hemoglobin: 10.5 g/dL — ABNORMAL LOW (ref 12.0–15.0)
Lymphocytes Relative: 3 % — ABNORMAL LOW (ref 12–46)
Lymphs Abs: 0.2 K/uL — ABNORMAL LOW (ref 0.7–4.0)
MCH: 21.8 pg — ABNORMAL LOW (ref 26.0–34.0)
MCHC: 31.1 g/dL (ref 30.0–36.0)
MCV: 70.1 fL — ABNORMAL LOW (ref 78.0–100.0)
Monocytes Absolute: 0.5 K/uL (ref 0.1–1.0)
Monocytes Relative: 7 % (ref 3–12)
Neutro Abs: 6.1 K/uL (ref 1.7–7.7)
Neutrophils Relative %: 90 % — ABNORMAL HIGH (ref 43–77)
Platelets: 300 K/uL (ref 150–400)
RBC: 4.82 MIL/uL (ref 3.87–5.11)
RDW: 18.5 % — ABNORMAL HIGH (ref 11.5–15.5)
WBC: 6.8 K/uL (ref 4.0–10.5)

## 2014-08-18 LAB — CBC
HEMATOCRIT: 35.1 % — AB (ref 36.0–46.0)
Hemoglobin: 10.7 g/dL — ABNORMAL LOW (ref 12.0–15.0)
MCH: 21.5 pg — ABNORMAL LOW (ref 26.0–34.0)
MCHC: 30.5 g/dL (ref 30.0–36.0)
MCV: 70.6 fL — AB (ref 78.0–100.0)
Platelets: 273 10*3/uL (ref 150–400)
RBC: 4.97 MIL/uL (ref 3.87–5.11)
RDW: 18.8 % — AB (ref 11.5–15.5)
WBC: 10.8 10*3/uL — AB (ref 4.0–10.5)

## 2014-08-18 LAB — COMPREHENSIVE METABOLIC PANEL
ALK PHOS: 108 U/L (ref 39–117)
ALT: 39 U/L — AB (ref 0–35)
AST: 58 U/L — ABNORMAL HIGH (ref 0–37)
Albumin: 2.6 g/dL — ABNORMAL LOW (ref 3.5–5.2)
Anion gap: 15 (ref 5–15)
BUN: 15 mg/dL (ref 6–23)
CO2: 25 mEq/L (ref 19–32)
Calcium: 9.2 mg/dL (ref 8.4–10.5)
Chloride: 99 mEq/L (ref 96–112)
Creatinine, Ser: 0.95 mg/dL (ref 0.50–1.10)
GFR calc non Af Amer: 78 mL/min — ABNORMAL LOW (ref >90)
Glucose, Bld: 105 mg/dL — ABNORMAL HIGH (ref 70–99)
Potassium: 3.4 mEq/L — ABNORMAL LOW (ref 3.7–5.3)
SODIUM: 139 meq/L (ref 137–147)
TOTAL PROTEIN: 7.1 g/dL (ref 6.0–8.3)
Total Bilirubin: 0.3 mg/dL (ref 0.3–1.2)

## 2014-08-18 LAB — LIPASE, BLOOD: Lipase: 19 U/L (ref 11–59)

## 2014-08-18 SURGERY — LAPAROTOMY, EXPLORATORY
Anesthesia: General | Site: Abdomen

## 2014-08-18 MED ORDER — THIAMINE HCL 100 MG/ML IJ SOLN
100.0000 mg | Freq: Every day | INTRAMUSCULAR | Status: DC
  Administered 2014-08-20 – 2014-08-23 (×4): 100 mg via INTRAVENOUS
  Filled 2014-08-18 (×6): qty 1
  Filled 2014-08-18: qty 2

## 2014-08-18 MED ORDER — DIPHENHYDRAMINE HCL 12.5 MG/5ML PO ELIX
12.5000 mg | ORAL_SOLUTION | Freq: Four times a day (QID) | ORAL | Status: DC | PRN
Start: 2014-08-18 — End: 2014-08-23

## 2014-08-18 MED ORDER — ROCURONIUM BROMIDE 100 MG/10ML IV SOLN
INTRAVENOUS | Status: DC | PRN
  Administered 2014-08-18: 50 mg via INTRAVENOUS

## 2014-08-18 MED ORDER — MORPHINE SULFATE 4 MG/ML IJ SOLN
8.0000 mg | Freq: Once | INTRAMUSCULAR | Status: AC
  Administered 2014-08-18: 8 mg via INTRAVENOUS
  Filled 2014-08-18: qty 2

## 2014-08-18 MED ORDER — FENTANYL CITRATE 0.05 MG/ML IJ SOLN
INTRAMUSCULAR | Status: AC
  Filled 2014-08-18: qty 4

## 2014-08-18 MED ORDER — FENTANYL 10 MCG/ML IV SOLN
INTRAVENOUS | Status: DC
  Administered 2014-08-18: 270 ug via INTRAVENOUS
  Administered 2014-08-18: 50 mL via INTRAVENOUS
  Administered 2014-08-18: 30 ug via INTRAVENOUS
  Administered 2014-08-19: 150 ug via INTRAVENOUS
  Administered 2014-08-19: 240 ug via INTRAVENOUS
  Administered 2014-08-19: 12:00:00 via INTRAVENOUS
  Administered 2014-08-19: 125 ug via INTRAVENOUS
  Administered 2014-08-19: 195 ug via INTRAVENOUS
  Administered 2014-08-19 (×2): via INTRAVENOUS
  Administered 2014-08-20: 255 ug via INTRAVENOUS
  Administered 2014-08-20: 240 ug via INTRAVENOUS
  Administered 2014-08-20: 180 ug via INTRAVENOUS
  Administered 2014-08-20: 225 ug via INTRAVENOUS
  Administered 2014-08-20: 195 ug via INTRAVENOUS
  Administered 2014-08-20: 14:00:00 via INTRAVENOUS
  Administered 2014-08-20: 165 ug via INTRAVENOUS
  Administered 2014-08-21: 75 ug via INTRAVENOUS
  Administered 2014-08-21: 09:00:00 via INTRAVENOUS
  Administered 2014-08-21: 210 ug via INTRAVENOUS
  Administered 2014-08-21: 145.2 ug via INTRAVENOUS
  Administered 2014-08-21: 315 ug via INTRAVENOUS
  Administered 2014-08-21: 150 ug/h via INTRAVENOUS
  Administered 2014-08-21: 75 ug via INTRAVENOUS
  Administered 2014-08-22: 145 ug via INTRAVENOUS
  Administered 2014-08-22: 131.5 ug via INTRAVENOUS
  Administered 2014-08-22: 225 ug via INTRAVENOUS
  Administered 2014-08-22: 13:00:00 via INTRAVENOUS
  Administered 2014-08-22: 45 ug via INTRAVENOUS
  Administered 2014-08-22: 180 ug via INTRAVENOUS
  Administered 2014-08-22: 220 ug via INTRAVENOUS
  Administered 2014-08-23: 195 ug via INTRAVENOUS
  Administered 2014-08-23: 117 ug via INTRAVENOUS
  Filled 2014-08-18 (×13): qty 50

## 2014-08-18 MED ORDER — LACTATED RINGERS IV SOLN
INTRAVENOUS | Status: DC | PRN
  Administered 2014-08-18: 16:00:00 via INTRAVENOUS

## 2014-08-18 MED ORDER — PROPOFOL 10 MG/ML IV BOLUS
INTRAVENOUS | Status: AC
  Filled 2014-08-18: qty 20

## 2014-08-18 MED ORDER — ONDANSETRON HCL 4 MG/2ML IJ SOLN
INTRAMUSCULAR | Status: AC
  Filled 2014-08-18: qty 2

## 2014-08-18 MED ORDER — SODIUM CHLORIDE 0.9 % IV SOLN: 150.0000 mg | Freq: Every day | INTRAVENOUS | Status: DC

## 2014-08-18 MED ORDER — ENOXAPARIN SODIUM 40 MG/0.4ML ~~LOC~~ SOLN
40.0000 mg | SUBCUTANEOUS | Status: DC
  Administered 2014-08-19 – 2014-08-26 (×8): 40 mg via SUBCUTANEOUS
  Filled 2014-08-18 (×8): qty 0.4

## 2014-08-18 MED ORDER — GLYCOPYRROLATE 0.2 MG/ML IJ SOLN
INTRAMUSCULAR | Status: DC | PRN
  Administered 2014-08-18: .4 mg via INTRAVENOUS

## 2014-08-18 MED ORDER — MIDAZOLAM HCL 2 MG/2ML IJ SOLN
INTRAMUSCULAR | Status: AC
  Administered 2014-08-18: 2 mg
  Filled 2014-08-18: qty 2

## 2014-08-18 MED ORDER — ADULT MULTIVITAMIN W/MINERALS CH
1.0000 | ORAL_TABLET | Freq: Every day | ORAL | Status: DC
  Filled 2014-08-18 (×4): qty 1

## 2014-08-18 MED ORDER — MIDAZOLAM HCL 5 MG/5ML IJ SOLN
INTRAMUSCULAR | Status: DC | PRN
  Administered 2014-08-18: 2 mg via INTRAVENOUS

## 2014-08-18 MED ORDER — LACTATED RINGERS IV SOLN
INTRAVENOUS | Status: DC
  Administered 2014-08-18: 15:00:00 via INTRAVENOUS

## 2014-08-18 MED ORDER — ONDANSETRON HCL 4 MG/2ML IJ SOLN: 4.0000 mg | Freq: Four times a day (QID) | INTRAMUSCULAR | Status: DC | PRN

## 2014-08-18 MED ORDER — FENTANYL CITRATE 0.05 MG/ML IJ SOLN
INTRAMUSCULAR | Status: AC
  Filled 2014-08-18: qty 5

## 2014-08-18 MED ORDER — IOHEXOL 300 MG/ML  SOLN
25.0000 mL | Freq: Once | INTRAMUSCULAR | Status: AC | PRN
  Administered 2014-08-18: 25 mL via ORAL

## 2014-08-18 MED ORDER — MIDAZOLAM HCL 2 MG/2ML IJ SOLN
INTRAMUSCULAR | Status: AC
  Filled 2014-08-18: qty 2

## 2014-08-18 MED ORDER — FOLIC ACID 1 MG PO TABS
1.0000 mg | ORAL_TABLET | Freq: Every day | ORAL | Status: DC
  Filled 2014-08-18 (×4): qty 1

## 2014-08-18 MED ORDER — VITAMIN B-1 100 MG PO TABS
100.0000 mg | ORAL_TABLET | Freq: Every day | ORAL | Status: DC
  Administered 2014-08-24 – 2014-08-26 (×3): 100 mg via ORAL
  Filled 2014-08-18 (×9): qty 1

## 2014-08-18 MED ORDER — ONDANSETRON HCL 4 MG PO TABS: 4.0000 mg | ORAL_TABLET | Freq: Four times a day (QID) | ORAL | Status: DC | PRN

## 2014-08-18 MED ORDER — LIDOCAINE HCL (CARDIAC) 20 MG/ML IV SOLN
INTRAVENOUS | Status: DC | PRN
Start: 2014-08-18 — End: 2014-08-18
  Administered 2014-08-18: 50 mg via INTRAVENOUS

## 2014-08-18 MED ORDER — SUCCINYLCHOLINE CHLORIDE 20 MG/ML IJ SOLN
INTRAMUSCULAR | Status: DC | PRN
  Administered 2014-08-18: 120 mg via INTRAVENOUS

## 2014-08-18 MED ORDER — POTASSIUM CHLORIDE IN NACL 20-0.9 MEQ/L-% IV SOLN: INTRAVENOUS | Status: DC

## 2014-08-18 MED ORDER — ENOXAPARIN SODIUM 40 MG/0.4ML ~~LOC~~ SOLN: 40.0000 mg | SUBCUTANEOUS | Status: DC

## 2014-08-18 MED ORDER — ONDANSETRON HCL 4 MG/2ML IJ SOLN
4.0000 mg | Freq: Once | INTRAMUSCULAR | Status: AC
  Administered 2014-08-18: 4 mg via INTRAVENOUS
  Filled 2014-08-18: qty 2

## 2014-08-18 MED ORDER — PIPERACILLIN-TAZOBACTAM 3.375 G IVPB
3.3750 g | Freq: Three times a day (TID) | INTRAVENOUS | Status: AC
  Administered 2014-08-18 – 2014-08-24 (×19): 3.375 g via INTRAVENOUS
  Filled 2014-08-18 (×21): qty 50

## 2014-08-18 MED ORDER — SODIUM CHLORIDE 0.9 % IV SOLN
1000.0000 mL | Freq: Once | INTRAVENOUS | Status: AC
  Administered 2014-08-18: 1000 mL via INTRAVENOUS

## 2014-08-18 MED ORDER — SODIUM CHLORIDE 0.9 % IV SOLN: 1000.0000 mL | INTRAVENOUS | Status: DC

## 2014-08-18 MED ORDER — NALOXONE HCL 0.4 MG/ML IJ SOLN
0.4000 mg | INTRAMUSCULAR | Status: DC | PRN
Start: 2014-08-18 — End: 2014-08-23

## 2014-08-18 MED ORDER — GLYCOPYRROLATE 0.2 MG/ML IJ SOLN
INTRAMUSCULAR | Status: AC
  Filled 2014-08-18: qty 2

## 2014-08-18 MED ORDER — IOHEXOL 300 MG/ML  SOLN
100.0000 mL | Freq: Once | INTRAMUSCULAR | Status: AC | PRN
  Administered 2014-08-18: 100 mL via INTRAVENOUS

## 2014-08-18 MED ORDER — LORAZEPAM 2 MG/ML IJ SOLN
0.0000 mg | Freq: Four times a day (QID) | INTRAMUSCULAR | Status: AC
  Administered 2014-08-18: 1 mg via INTRAVENOUS
  Filled 2014-08-18: qty 1

## 2014-08-18 MED ORDER — ONDANSETRON HCL 4 MG/2ML IJ SOLN
4.0000 mg | Freq: Four times a day (QID) | INTRAMUSCULAR | Status: DC | PRN
  Administered 2014-08-18: 4 mg via INTRAVENOUS

## 2014-08-18 MED ORDER — DIPHENHYDRAMINE HCL 50 MG/ML IJ SOLN
12.5000 mg | Freq: Four times a day (QID) | INTRAMUSCULAR | Status: DC | PRN
  Administered 2014-08-22: 12.5 mg via INTRAVENOUS
  Filled 2014-08-18: qty 1

## 2014-08-18 MED ORDER — MORPHINE SULFATE 2 MG/ML IJ SOLN: 2.0000 mg | INTRAMUSCULAR | Status: DC | PRN

## 2014-08-18 MED ORDER — PIPERACILLIN-TAZOBACTAM 3.375 G IVPB
3.3750 g | Freq: Once | INTRAVENOUS | Status: AC
  Administered 2014-08-18: 3.375 g via INTRAVENOUS
  Filled 2014-08-18: qty 50

## 2014-08-18 MED ORDER — FENTANYL CITRATE 0.05 MG/ML IJ SOLN
25.0000 ug | INTRAMUSCULAR | Status: DC | PRN
  Administered 2014-08-18: 50 ug via INTRAVENOUS

## 2014-08-18 MED ORDER — HYDROMORPHONE HCL 1 MG/ML IJ SOLN
INTRAMUSCULAR | Status: AC
  Filled 2014-08-18: qty 2

## 2014-08-18 MED ORDER — LORAZEPAM 2 MG/ML IJ SOLN
1.0000 mg | Freq: Four times a day (QID) | INTRAMUSCULAR | Status: AC | PRN
Start: 2014-08-18 — End: 2014-08-21
  Administered 2014-08-19 (×2): 1 mg via INTRAVENOUS
  Administered 2014-08-20: 0.5 mg via INTRAVENOUS
  Filled 2014-08-18 (×4): qty 1

## 2014-08-18 MED ORDER — NEOSTIGMINE METHYLSULFATE 10 MG/10ML IV SOLN
INTRAVENOUS | Status: DC | PRN
  Administered 2014-08-18: 5 mg via INTRAVENOUS

## 2014-08-18 MED ORDER — MORPHINE SULFATE 4 MG/ML IJ SOLN
4.0000 mg | INTRAMUSCULAR | Status: DC | PRN
  Administered 2014-08-20 – 2014-08-25 (×14): 4 mg via INTRAVENOUS
  Filled 2014-08-18 (×14): qty 1

## 2014-08-18 MED ORDER — LORAZEPAM 2 MG/ML IJ SOLN
0.0000 mg | Freq: Two times a day (BID) | INTRAMUSCULAR | Status: AC
  Administered 2014-08-21: 1 mg via INTRAVENOUS
  Filled 2014-08-18: qty 1

## 2014-08-18 MED ORDER — PIPERACILLIN-TAZOBACTAM 3.375 G IVPB: 3.3750 g | Freq: Three times a day (TID) | INTRAVENOUS | Status: DC

## 2014-08-18 MED ORDER — LORAZEPAM 1 MG PO TABS: 1.0000 mg | ORAL_TABLET | Freq: Four times a day (QID) | ORAL | Status: AC | PRN

## 2014-08-18 MED ORDER — SODIUM CHLORIDE 0.9 % IJ SOLN: 9.0000 mL | INTRAMUSCULAR | Status: DC | PRN

## 2014-08-18 MED ORDER — POTASSIUM CHLORIDE IN NACL 20-0.9 MEQ/L-% IV SOLN
INTRAVENOUS | Status: DC
  Administered 2014-08-18 – 2014-08-21 (×6): via INTRAVENOUS
  Filled 2014-08-18 (×11): qty 1000

## 2014-08-18 MED ORDER — FENTANYL CITRATE 0.05 MG/ML IJ SOLN
INTRAMUSCULAR | Status: DC | PRN
  Administered 2014-08-18 (×2): 100 ug via INTRAVENOUS
  Administered 2014-08-18: 150 ug via INTRAVENOUS
  Administered 2014-08-18 (×3): 50 ug via INTRAVENOUS

## 2014-08-18 MED ORDER — 0.9 % SODIUM CHLORIDE (POUR BTL) OPTIME
TOPICAL | Status: DC | PRN
  Administered 2014-08-18: 4000 mL

## 2014-08-18 MED ORDER — FENTANYL CITRATE 0.05 MG/ML IJ SOLN
25.0000 ug | INTRAMUSCULAR | Status: DC | PRN
  Administered 2014-08-18 (×3): 50 ug via INTRAVENOUS

## 2014-08-18 SURGICAL SUPPLY — 50 items
BLADE SURG ROTATE 9660 (MISCELLANEOUS) IMPLANT
CANISTER SUCTION 2500CC (MISCELLANEOUS) ×2 IMPLANT
CANISTER WOUND CARE 500ML ATS (WOUND CARE) ×2 IMPLANT
COVER MAYO STAND STRL (DRAPES) IMPLANT
COVER SURGICAL LIGHT HANDLE (MISCELLANEOUS) ×2 IMPLANT
DRAPE LAPAROSCOPIC ABDOMINAL (DRAPES) ×2 IMPLANT
DRAPE PROXIMA HALF (DRAPES) IMPLANT
DRAPE WARM FLUID 44X44 (DRAPE) ×2 IMPLANT
DRSG OPSITE POSTOP 4X10 (GAUZE/BANDAGES/DRESSINGS) IMPLANT
DRSG OPSITE POSTOP 4X8 (GAUZE/BANDAGES/DRESSINGS) IMPLANT
DRSG VAC ATS MED SENSATRAC (GAUZE/BANDAGES/DRESSINGS) ×2 IMPLANT
ELECT BLADE 6.5 EXT (BLADE) ×2 IMPLANT
ELECT CAUTERY BLADE 6.4 (BLADE) ×2 IMPLANT
ELECT REM PT RETURN 9FT ADLT (ELECTROSURGICAL) ×2
ELECTRODE REM PT RTRN 9FT ADLT (ELECTROSURGICAL) ×1 IMPLANT
GLOVE BIO SURGEON STRL SZ7 (GLOVE) ×2 IMPLANT
GLOVE BIO SURGEON STRL SZ7.5 (GLOVE) ×2 IMPLANT
GLOVE BIOGEL PI IND STRL 7.0 (GLOVE) ×1 IMPLANT
GLOVE BIOGEL PI IND STRL 7.5 (GLOVE) ×2 IMPLANT
GLOVE BIOGEL PI INDICATOR 7.0 (GLOVE) ×1
GLOVE BIOGEL PI INDICATOR 7.5 (GLOVE) ×2
GLOVE SURG SIGNA 7.5 PF LTX (GLOVE) ×4 IMPLANT
GOWN STRL REUS W/ TWL LRG LVL3 (GOWN DISPOSABLE) ×2 IMPLANT
GOWN STRL REUS W/ TWL XL LVL3 (GOWN DISPOSABLE) ×1 IMPLANT
GOWN STRL REUS W/TWL LRG LVL3 (GOWN DISPOSABLE) ×2
GOWN STRL REUS W/TWL XL LVL3 (GOWN DISPOSABLE) ×1
KIT BASIN OR (CUSTOM PROCEDURE TRAY) ×2 IMPLANT
KIT ROOM TURNOVER OR (KITS) ×2 IMPLANT
LIGASURE IMPACT 36 18CM CVD LR (INSTRUMENTS) ×2 IMPLANT
NS IRRIG 1000ML POUR BTL (IV SOLUTION) ×4 IMPLANT
PACK GENERAL/GYN (CUSTOM PROCEDURE TRAY) ×2 IMPLANT
PAD ARMBOARD 7.5X6 YLW CONV (MISCELLANEOUS) ×2 IMPLANT
PENCIL BUTTON HOLSTER BLD 10FT (ELECTRODE) IMPLANT
SPECIMEN JAR LARGE (MISCELLANEOUS) IMPLANT
SPONGE LAP 18X18 X RAY DECT (DISPOSABLE) IMPLANT
STAPLER VISISTAT 35W (STAPLE) ×2 IMPLANT
SUCTION POOLE TIP (SUCTIONS) ×2 IMPLANT
SUT PDS AB 1 TP1 96 (SUTURE) ×4 IMPLANT
SUT SILK 2 0 SH CR/8 (SUTURE) ×2 IMPLANT
SUT SILK 2 0 TIES 10X30 (SUTURE) ×2 IMPLANT
SUT SILK 2 0SH CR/8 30 (SUTURE) ×2 IMPLANT
SUT SILK 3 0 SH CR/8 (SUTURE) ×2 IMPLANT
SUT SILK 3 0 TIES 10X30 (SUTURE) ×2 IMPLANT
SUT VIC AB 3-0 SH 18 (SUTURE) IMPLANT
TOWEL OR 17X24 6PK STRL BLUE (TOWEL DISPOSABLE) ×2 IMPLANT
TOWEL OR 17X26 10 PK STRL BLUE (TOWEL DISPOSABLE) ×2 IMPLANT
TRAY FOLEY CATH 14FRSI W/METER (CATHETERS) ×2 IMPLANT
TRAY FOLEY CATH 16FRSI W/METER (SET/KITS/TRAYS/PACK) IMPLANT
TUBE CONNECTING 12X1/4 (SUCTIONS) IMPLANT
YANKAUER SUCT BULB TIP NO VENT (SUCTIONS) IMPLANT

## 2014-08-18 NOTE — Progress Notes (Signed)
Patient ID: Beth [name], female   DOB: 1981-02-25, 33 y.o.   MRN: [id number]  Seen and agree  Patient with perforated bowel.  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  Needs emergent exploratory laparotomy.  I discussed this with the patient and her family.  I discussed the risks of surgery which include but is not limited to bleeding, infection, leak, need for further surgery, cardiopulmonary problems, DVT, post op problems related to her substance abuse problems, etc.  She agrees to proceed.

## 2014-08-18 NOTE — ED Notes (Addendum)
General Surgery at bedside.

## 2014-08-18 NOTE — H&P (Signed)
I have seen and examined the patient and agree with the assessment and plans.  Akhilesh Sassone A. Paulo Keimig  MD, FACS  

## 2014-08-18 NOTE — Anesthesia Preprocedure Evaluation (Addendum)
Anesthesia Evaluation  Patient identified by MRN, date of birth, ID band Patient awake    Reviewed: Allergy & Precautions, H&P , NPO status , Patient's Chart, lab work & pertinent test results  History of Anesthesia Complications Negative for: history of anesthetic complications  Airway Mallampati: II      Dental  (+) Teeth Intact, Dental Advisory Given   Pulmonary neg pulmonary ROS, asthma , Current Smoker,  breath sounds clear to auscultation  Pulmonary exam normal       Cardiovascular negative cardio ROS  Rhythm:Regular Rate:Normal     Neuro/Psych  Headaches, Anxiety Multiple sclerosis    GI/Hepatic Neg liver ROS, PUD, GERD-  Medicated,  Endo/Other  negative endocrine ROS  Renal/GU negative Renal ROS     Musculoskeletal   Abdominal Normal abdominal exam  (+)   Peds  Hematology negative hematology ROS (+)   Anesthesia Other Findings   Reproductive/Obstetrics                         Anesthesia Physical Anesthesia Plan  ASA: III and emergent  Anesthesia Plan: General   Post-op Pain Management:    Induction: Intravenous  Airway Management Planned: Mask and Oral ETT  Additional Equipment:   Intra-op Plan:   Post-operative Plan: Extubation in OR  Informed Consent: I have reviewed the patients History and Physical, chart, labs and discussed the procedure including the risks, benefits and alternatives for the proposed anesthesia with the patient or authorized representative who has indicated his/her understanding and acceptance.   Dental advisory given  Plan Discussed with: CRNA, Anesthesiologist and Surgeon  Anesthesia Plan Comments:        Anesthesia Quick Evaluation

## 2014-08-18 NOTE — H&P (Signed)
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  Onset was sudden this morning.  Time pattern is constant.  Severe in severity.  Location is generalized.  Her symptoms were preceded by fever, chills and sweats which started on Sunday night.  She reports stopping alcohol about 1 week.  She did not have abdominal pain prior to awakening this morning.  Modifying factors include pepto bismol, tums, goody powders, aleve over the last few days.  No aggravating or alleviating factors.  Her work up shows intraperitoneal free air, normal white count, normal kidney function, K 3.4.  We have been asked to evaluate the patient due to free air.    Past Medical History  Diagnosis Date  . Acid reflux   . Asthma   . History of blood transfusion   . Multiple sclerosis 2005    diagnosed in [place], failed steroids, no other medication  . Anxiety   . Gastric ulcer   . Migraine   . Chronic nausea     self reported  . Chronic diarrhea     self reported  . Chronic pain     self reported chronic pain, back pain, fibromyalgia  . Insomnia   . Polysubstance abuse   . ETOH abuse   . Delirium tremens     Past Surgical History  Procedure Laterality Date  . Gastric bypass    . Diagnostic laparoscopy  2003    repair of perforation at [place] anastamosis   . Lumbar laminectomy/decompression microdiscectomy Right 04/14/2014    Procedure: LUMBAR LAMINECTOMY/DECOMPRESSION MICRODISCECTOMY LEVEL L4-5;  Surgeon: Erline Levine, MD;  Location: [place] [facility];  Service: Neurosurgery;  Laterality: Right;  LUMBAR LAMINECTOMY/DECOMPRESSION MICRODISCECTOMY LEVEL L4-5    Family History  Problem Relation Age of Onset  . Hypertension Paternal Grandfather   . Colon cancer Paternal  Grandfather 63  . Asthma Paternal Grandmother   . Hypertension Paternal Grandmother   . Breast cancer Paternal Grandmother   . Cancer Paternal Grandmother     lung   . Diabetes Maternal Grandmother   . Diabetes Maternal Grandfather   . Hypertension Father   . Urinary tract infection Mother   . Stroke Mother 33    3 strokes    Social History:  reports that she has been smoking Cigarettes.  She has been smoking about 0.10 packs per day. She has never used smokeless tobacco. She reports that she drinks about 195.6 ounces of alcohol per week. She reports that she uses illicit drugs (Marijuana, Cocaine, Methamphetamines, and Oxycodone).  Allergies:  Allergies  Allergen Reactions  . Cymbalta [Duloxetine Hcl]     Made body jerk and twitch, spasms  . Dilaudid [Hydromorphone Hcl] Other (See Comments)    Anger, aggression   . Prednisone     Steroids in general   Medication: Prior to Admission medications   Medication Sig Start Date End Date Taking? Authorizing Provider  naproxen sodium (ANAPROX) 220 MG tablet Take 220 mg by mouth 2 (two) times daily with a meal.   Yes Historical Provider, MD  oxyCODONE-acetaminophen (PERCOCET/ROXICET) 5-325 MG per tablet Take 2 tablets by mouth every 4 (four) hours as needed for moderate pain or severe pain. 06/28/14   Pura Spice, PA-C      (Not  in a hospital admission)  Results for orders placed during the hospital encounter of 08/18/14 (from the past 48 hour(s))  CBC WITH DIFFERENTIAL     Status: Abnormal   Collection Time    08/18/14 11:25 AM      Result Value Ref Range   WBC 6.8  4.0 - 10.5 K/uL   RBC 4.82  3.87 - 5.11 MIL/uL   Hemoglobin 10.5 (*) 12.0 - 15.0 g/dL   HCT 33.8 (*) 36.0 - 46.0 %   MCV 70.1 (*) 78.0 - 100.0 fL   MCH 21.8 (*) 26.0 - 34.0 pg   MCHC 31.1  30.0 - 36.0 g/dL   RDW 18.5 (*) 11.5 - 15.5 %   Platelets 300  150 - 400 K/uL   Neutrophils Relative % 90 (*) 43 - 77 %   Neutro Abs 6.1  1.7 - 7.7 K/uL   Lymphocytes  Relative 3 (*) 12 - 46 %   Lymphs Abs 0.2 (*) 0.7 - 4.0 K/uL   Monocytes Relative 7  3 - 12 %   Monocytes Absolute 0.5  0.1 - 1.0 K/uL   Eosinophils Relative 0  0 - 5 %   Eosinophils Absolute 0.0  0.0 - 0.7 K/uL   Basophils Relative 0  0 - 1 %   Basophils Absolute 0.0  0.0 - 0.1 K/uL  COMPREHENSIVE METABOLIC PANEL     Status: Abnormal   Collection Time    08/18/14 11:25 AM      Result Value Ref Range   Sodium 139  137 - 147 mEq/L   Potassium 3.4 (*) 3.7 - 5.3 mEq/L   Chloride 99  96 - 112 mEq/L   CO2 25  19 - 32 mEq/L   Glucose, Bld 105 (*) 70 - 99 mg/dL   BUN 15  6 - 23 mg/dL   Creatinine, Ser 0.95  0.50 - 1.10 mg/dL   Calcium 9.2  8.4 - 10.5 mg/dL   Total Protein 7.1  6.0 - 8.3 g/dL   Albumin 2.6 (*) 3.5 - 5.2 g/dL   AST 58 (*) 0 - 37 U/L   ALT 39 (*) 0 - 35 U/L   Alkaline Phosphatase 108  39 - 117 U/L   Total Bilirubin 0.3  0.3 - 1.2 mg/dL   GFR calc non Af Amer 78 (*) >90 mL/min   GFR calc Af Amer >90  >90 mL/min   Comment: (NOTE)     The eGFR has been calculated using the CKD EPI equation.     This calculation has not been validated in all clinical situations.     eGFR's persistently <90 mL/min signify possible Chronic Kidney     Disease.   Anion gap 15  5 - 15  LIPASE, BLOOD     Status: None   Collection Time    08/18/14 11:25 AM      Result Value Ref Range   Lipase 19  11 - 59 U/L  I-STAT BETA HCG BLOOD, ED ([facility], [facility], [facility] ONLY)     Status: None   Collection Time    08/18/14 11:39 AM      Result Value Ref Range   I-stat hCG, quantitative <5.0  <5 mIU/mL   Comment 3            Comment:   GEST. AGE      CONC.  (mIU/mL)       <=1 WEEK        5 - 50  2 WEEKS       50 - 500         3 WEEKS       100 - 10,000         4 WEEKS     1,000 - 30,000                FEMALE AND NON-PREGNANT FEMALE:         LESS THAN 5 mIU/mL   Ct Abdomen Pelvis W Contrast  08/18/2014   CLINICAL DATA:  Abdominal pain and distention for a few hours. Nausea and vomiting for 2 days with  chills. History of gastric bypass.  EXAM: CT ABDOMEN AND PELVIS WITH CONTRAST  TECHNIQUE: Multidetector CT imaging of the abdomen and pelvis was performed using the standard protocol following bolus administration of intravenous contrast.  CONTRAST:  164m OMNIPAQUE IOHEXOL 300 MG/ML  SOLN  COMPARISON:  03/13/2013  FINDINGS: Lower chest:  Unremarkable  Hepatobiliary:  Unremarkable  Pancreas: Unremarkable  Spleen: Unremarkable  Adrenals/Urinary Tract: Mild abnormal hypodensity in the left renal cortex. Pyelonephritis not excluded.  Stomach/Bowel: Free intraperitoneal gas and free intraperitoneal contrast concentrated in the upper abdomen. The contrast itself is along the gastrojejunostomy site and excluded part of the stomach. This is highly likely to reflect proximal perforation along the stomach. The amount of free intraperitoneal gas is large.  Vascular/Lymphatic: Unremarkable  Reproductive: Poorly seen due to surrounding free fluid.  Other: Scattered free fluid in the pelvis. Intraperitoneal contrast medium in the upper abdomen.  Musculoskeletal:  Degenerative disc disease, L4-5.  IMPRESSION: 1. Free intraperitoneal gas, fluid, and contrast favoring a proximal perforation along the stomach. Prior gastric bypass. 2. Abnormal hypodensity in the left renal cortex concerning for possible pyelonephritis. Correlate with urine analysis.   Electronically Signed   By: WSherryl BartersM.D.   On: 08/18/2014 14:11    Review of Systems  Constitutional: Positive for fever, chills, malaise/fatigue and diaphoresis. Negative for weight loss.  Cardiovascular: Negative for chest pain, palpitations, claudication and leg swelling.  Neurological: Positive for weakness and headaches.  All other systems reviewed and are negative.   Blood pressure 114/66, pulse 106, temperature 98.2 F (36.8 C), temperature source Oral, resp. rate 17, last menstrual period 08/04/2014, SpO2 95.00%. Physical Exam  General:obese white female  who is laying in bed in distress secondary to pain HEENT: head is normocephalic, atraumatic.  Sclera are noninjected.  PERRL.  Ears and nose without any masses or lesions.  Mouth is pink and moist Heart: regular, rate, and rhythm.  Normal s1,s2. No obvious murmurs, gallops, or rubs noted.  Palpable radial and pedal pulses bilaterally Lungs: CTAB, no wheezes, rhonchi, or rales noted.  Respiratory effort nonlabored Abd: soft, diffusely tender with voluntary guarding, ND, obese, Hypoactive BS, no masses, hernias, or organomegaly MS: all 4 extremities are symmetrical with no cyanosis, clubbing, or edema. Skin: warm and dry with no masses, lesions, or rashes Psych: A&Ox3 but in distress secondary to pain   Assessment/Plan 1. Intraperitoneal free air likely from a perforated ulcer -proceed with urgent laparotomy -zosyn and micafungin -NPO -IVF -SCD/lovenox -will need ETOH withdrawal protocol post operative - surgery was discussed with the patient as well as her parents.  They all understand and are agreeable to proceed. 2. Untreated Multiple Sclerosis 3. Polysubstance abuse 4. ETOH abuse 5. Anxiety  6. H/o perforated GJ anastomosis  7. H/o roux-en-y gastric bypass surgery in 2003 8. H/o DTs  Donnae Michels E 08/18/2014, 2:28 PM

## 2014-08-18 NOTE — ED Provider Notes (Signed)
CSN: [id number]     Arrival date & time 08/18/14  [id number] History   First MD Initiated Contact with Patient 08/18/14 1010     Chief Complaint  Patient presents with  . Abdominal Pain      The history is provided by the patient.   patient reports 3 days of abdominal pain with associated nausea.  She states she woke this morning and developed acute onset worsening abdominal pain which she describes as a burning sensation to her upper abdomen radiating towards her lower abdomen all the way to her suprapubic region.  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  She states [place] [facility] [place].  She is concerned something similar may have happened.  She denies diarrhea.  No chest pain or shortness of breath.  Her pain is moderate to severe in severity at this time.  She states she's never had discomfort or pain like this before.  Past Medical History  Diagnosis Date  . Acid reflux   . Asthma   . History of blood transfusion   . Multiple sclerosis 2005    [place], failed steroids, no other medication  . Anxiety   . Gastric ulcer   . Migraine   . Chronic nausea     self reported  . Chronic diarrhea     self reported  . Chronic pain     self reported chronic pain, back pain, fibromyalgia  . Insomnia    Past Surgical History  Procedure Laterality Date  . Gastric bypass    . Abdominal surgery  2003    weight loss of over 200 lbs  . Lumbar laminectomy/decompression microdiscectomy Right 04/14/2014    Procedure: LUMBAR LAMINECTOMY/DECOMPRESSION MICRODISCECTOMY LEVEL L4-5;  Surgeon: Maeola HarmanJoseph Stern, MD;  Location: [facility];  Service: Neurosurgery;  Laterality: Right;  LUMBAR LAMINECTOMY/DECOMPRESSION MICRODISCECTOMY LEVEL L4-5   Family History  Problem Relation Age of Onset  . Hypertension Paternal Grandfather   . Colon cancer Paternal  Grandfather [id number]  . Asthma Paternal Grandmother   . Hypertension Paternal Grandmother   . Breast cancer Paternal Grandmother   . Cancer Paternal Grandmother     lung   . Diabetes Maternal Grandmother   . Diabetes Maternal Grandfather   . Hypertension Father   . Urinary tract infection Mother   . Stroke Mother 1760    3 strokes    History  Substance Use Topics  . Smoking status: Current Some Day Smoker -- 0.10 packs/day    Types: Cigarettes    Last Attempt to Quit: 05/14/2011  . Smokeless tobacco: Never Used  . Alcohol Use: 1.2 oz/week    1 Cans of beer, 1 Shots of liquor per week     Comment: Says she can drink up to a 1/2 gallon/day   OB History   Grav Para Term Preterm Abortions TAB SAB Ect Mult Living   0         0     Review of Systems  Gastrointestinal: Positive for abdominal pain.  All other systems reviewed and are negative.     Allergies  Cymbalta; Dilaudid; and Prednisone  Home Medications   Prior to Admission medications   Medication Sig Start Date End Date Taking? Authorizing Provider  naproxen sodium (ANAPROX) 220 MG tablet Take 220 mg by mouth 2 (two) times daily with a meal.  Yes Historical Provider, MD  oxyCODONE-acetaminophen (PERCOCET/ROXICET) 5-325 MG per tablet Take 2 tablets by mouth every 4 (four) hours as needed for moderate pain or severe pain. 06/28/14   Louann Sjogren, PA-C   BP 110/68  Pulse 89  Temp(Src) 98.2 F (36.8 C) (Oral)  Resp 20  SpO2 97%  LMP 08/04/2014 Physical Exam  Nursing note and vitals reviewed. Constitutional: She is oriented to person, place, and time. She appears well-developed and well-nourished. No distress.  HENT:  Head: Normocephalic and atraumatic.  Eyes: EOM are normal.  Neck: Normal range of motion.  Cardiovascular: Normal rate, regular rhythm and normal heart sounds.   Pulmonary/Chest: Effort normal and breath sounds normal.  Abdominal: Soft. She exhibits no distension.  Mild generalized abdominal  tenderness without guarding or rebound  Musculoskeletal: Normal range of motion.  Neurological: She is alert and oriented to person, place, and time.  Skin: Skin is warm and dry.  Psychiatric: She has a normal mood and affect. Judgment normal.    ED Course  Procedures (including critical care time)   CRITICAL CARE Performed by: Lyanne Co Total critical care time: 32 Critical care time was exclusive of separately billable procedures and treating other patients. Critical care was necessary to treat or prevent imminent or life-threatening deterioration. Critical care was time spent personally by me on the following activities: development of treatment plan with patient and/or surrogate as well as nursing, discussions with consultants, evaluation of patient's response to treatment, examination of patient, obtaining history from patient or surrogate, ordering and performing treatments and interventions, ordering and review of laboratory studies, ordering and review of radiographic studies, pulse oximetry and re-evaluation of patient's condition.   Labs Review Labs Reviewed  CBC WITH DIFFERENTIAL - Abnormal; Notable for the following:    Hemoglobin 10.5 (*)    HCT 33.8 (*)    MCV 70.1 (*)    MCH 21.8 (*)    RDW 18.5 (*)    Neutrophils Relative % 90 (*)    Lymphocytes Relative 3 (*)    Lymphs Abs 0.2 (*)    All other components within normal limits  COMPREHENSIVE METABOLIC PANEL - Abnormal; Notable for the following:    Potassium 3.4 (*)    Glucose, Bld 105 (*)    Albumin 2.6 (*)    AST 58 (*)    ALT 39 (*)    GFR calc non Af Amer 78 (*)    All other components within normal limits  LIPASE, BLOOD  URINALYSIS, ROUTINE W REFLEX MICROSCOPIC  I-STAT BETA HCG BLOOD, ED ([facility], [facility], [facility] ONLY)    Imaging Review No results found.   EKG Interpretation None      MDM   Final diagnoses:  Bowel perforation   History of gastric bypass and perforation the past.  Labs, pain control,  n.p.o.  CT scan will be obtained.  1:43 PM Spoke with [facility] who will evaluate the pt and plan for operative repair now. Zosyn now. Still with significant pain  Lyanne Co, MD 08/18/14 1345

## 2014-08-18 NOTE — Progress Notes (Signed)
Dr. Alonna Buckler called with update on patient.  Noted slow but steady increase of heart rate from 102 on admission to now 130.  MD informed.  Ordered Versed for patient, 500 cc LR bolus (given) and cbc to be drawn stat.

## 2014-08-18 NOTE — Op Note (Signed)
EXPLORATORY LAPAROTOMY  Procedure Note  Beth [name] 08/18/2014   Pre-op Diagnosis: Perforated Bowel     Post-op Diagnosis: perforated marginal ulcer  Procedure(s): EXPLORATORY LAPAROTOMY OVERSEW AND PATCH REPAIR PERFORATED MARGINAL ULCER  Surgeon(s): Abigail Miyamoto, MD Manus Rudd, MD  Anesthesia: General  Staff:  Circulator: Milus Mallick, RN Scrub Person: Megan Day Cavanaugh, RN; Maureen Ralphs, RN Circulator Assistant: Charlott Rakes, RN  Estimated Blood Loss: Minimal                         [name]   Date: 08/18/2014  Time: 4:30 PM

## 2014-08-18 NOTE — Anesthesia Postprocedure Evaluation (Signed)
  Anesthesia Post-op Note  Patient: Beth [name]  Procedure(s) Performed: Procedure(s): EXPLORATORY LAPAROTOMY, Patch graft of gastric perforation (N/A)  Patient Location: PACU  Anesthesia Type:General  Level of Consciousness: awake  Airway and Oxygen Therapy: Patient Spontanous Breathing  Post-op Pain: mild  Post-op Assessment: Post-op Vital signs reviewed  Post-op Vital Signs: Reviewed  Last Vitals:  Filed Vitals:   08/18/14 1638  BP: 128/65  Pulse: 99  Temp: 36.8 C  Resp: 15    Complications: No apparent anesthesia complications

## 2014-08-18 NOTE — Transfer of Care (Signed)
Immediate Anesthesia Transfer of Care Note  Patient: Beth [name]  Procedure(s) Performed: Procedure(s): EXPLORATORY LAPAROTOMY, Patch graft of gastric perforation (N/A)  Patient Location: PACU  Anesthesia Type:General  Level of Consciousness: awake, alert  and patient cooperative  Airway & Oxygen Therapy: Patient Spontanous Breathing and Patient connected to nasal cannula oxygen  Post-op Assessment: Report given to PACU RN, Post -op Vital signs reviewed and stable and Patient moving all extremities  Post vital signs: Reviewed and stable  Complications: No apparent anesthesia complications

## 2014-08-18 NOTE — Discharge Planning (Signed)
[facility] Community Health & Eligibility Specialist  Spoke to patient regarding primary care resources and the [facility] orange card. Patient states she is familiar with the orange card and has had it in the past. [facility] card application given, pt instructed to contact me once discharged, with the application completed. Resource guide and my contact information also given for any future questions or concerns. No other Community Health & Eligibility Specialist needs identified at this time.

## 2014-08-18 NOTE — Progress Notes (Signed)
Rec'd report from [employer], assuming care of patient at this time

## 2014-08-18 NOTE — ED Notes (Addendum)
Pt has pain relief within 5 minute of morphine. Pt rates pain 4/10. Within 15 minute pt noted to have sharp pains rated 10/10.

## 2014-08-18 NOTE — ED Notes (Signed)
Per EMS pt c/o abd pain since yesterday. Pain 7/10. Pt received 50 fentanyl en route. Pt has had N/V since Saturday. Pt has hx MS, gastric bypass, and intestinal rupture in 2002-2005. EMS vitals 130/80 BP

## 2014-08-19 ENCOUNTER — Encounter (HOSPITAL_COMMUNITY): Payer: Self-pay | Admitting: General Practice

## 2014-08-19 LAB — COMPREHENSIVE METABOLIC PANEL
ALBUMIN: 2 g/dL — AB (ref 3.5–5.2)
ALT: 28 U/L (ref 0–35)
AST: 37 U/L (ref 0–37)
Alkaline Phosphatase: 82 U/L (ref 39–117)
Anion gap: 16 — ABNORMAL HIGH (ref 5–15)
BILIRUBIN TOTAL: 0.3 mg/dL (ref 0.3–1.2)
BUN: 15 mg/dL (ref 6–23)
CO2: 20 meq/L (ref 19–32)
Calcium: 8.2 mg/dL — ABNORMAL LOW (ref 8.4–10.5)
Chloride: 102 mEq/L (ref 96–112)
Creatinine, Ser: 0.96 mg/dL (ref 0.50–1.10)
GFR calc Af Amer: 89 mL/min — ABNORMAL LOW (ref >90)
GFR, EST NON AFRICAN AMERICAN: 77 mL/min — AB (ref >90)
Glucose, Bld: 117 mg/dL — ABNORMAL HIGH (ref 70–99)
Potassium: 4.3 mEq/L (ref 3.7–5.3)
SODIUM: 138 meq/L (ref 137–147)
Total Protein: 6.5 g/dL (ref 6.0–8.3)

## 2014-08-19 MED ORDER — CHLORHEXIDINE GLUCONATE 0.12 % MT SOLN
15.0000 mL | Freq: Two times a day (BID) | OROMUCOSAL | Status: DC
  Administered 2014-08-19 – 2014-08-26 (×15): 15 mL via OROMUCOSAL
  Filled 2014-08-19 (×12): qty 15

## 2014-08-19 MED ORDER — MENTHOL 3 MG MT LOZG: 1.0000 | LOZENGE | OROMUCOSAL | Status: DC | PRN

## 2014-08-19 MED ORDER — CETYLPYRIDINIUM CHLORIDE 0.05 % MT LIQD
7.0000 mL | Freq: Two times a day (BID) | OROMUCOSAL | Status: DC
  Administered 2014-08-20 – 2014-08-25 (×10): 7 mL via OROMUCOSAL

## 2014-08-19 MED ORDER — BLISTEX MEDICATED EX OINT
TOPICAL_OINTMENT | CUTANEOUS | Status: DC | PRN
  Administered 2014-08-19: 06:00:00 via TOPICAL
  Filled 2014-08-19 (×2): qty 10

## 2014-08-19 MED ORDER — ONDANSETRON HCL 4 MG/2ML IJ SOLN
4.0000 mg | Freq: Four times a day (QID) | INTRAMUSCULAR | Status: DC | PRN
  Administered 2014-08-21 – 2014-08-25 (×6): 4 mg via INTRAVENOUS
  Filled 2014-08-19 (×6): qty 2

## 2014-08-19 MED ORDER — INFLUENZA VAC SPLIT QUAD 0.5 ML IM SUSY
0.5000 mL | PREFILLED_SYRINGE | INTRAMUSCULAR | Status: AC
  Administered 2014-08-21: 0.5 mL via INTRAMUSCULAR
  Filled 2014-08-19: qty 0.5

## 2014-08-19 MED ORDER — FENTANYL CITRATE 0.05 MG/ML IJ SOLN
100.0000 ug | Freq: Once | INTRAMUSCULAR | Status: DC
Start: 2014-08-19 — End: 2014-08-26
  Filled 2014-08-19: qty 2

## 2014-08-19 MED ORDER — FENTANYL CITRATE 0.05 MG/ML IJ SOLN
INTRAMUSCULAR | Status: AC
  Administered 2014-08-19: 100 ug
  Filled 2014-08-19: qty 2

## 2014-08-19 NOTE — Progress Notes (Signed)
I have seen and examined the patient and agree with the assessment and plans. Will continue NPO and NG  Zannah Melucci A. Magnus Ivan  MD, FACS

## 2014-08-19 NOTE — Progress Notes (Signed)
LOS: 1 day   Subjective: 33 y/o F POD # 1 S/P Exp lap with patch repair of perforated marginal ulcer. Reports difficulty with pain and insomnia overnight. Given breakthrough dose of Fentanyl. Reports pain under reasonable control this morning. On PCA. Ice chips with NGT in place.     Objective: Vital signs in last 24 hours: Temp:  [97.5 F (36.4 C)-98.8 F (37.1 C)] 97.8 F (36.6 C) (10/22 0552) Pulse Rate:  [86-131] 93 (10/22 0552) Resp:  [14-98] 25 (10/22 0850) BP: (90-130)/(56-101) 110/56 mmHg (10/22 0552) SpO2:  [93 %-100 %] 98 % (10/22 0850) Weight:  [121.6 kg (268 lb 1.3 oz)] 121.6 kg (268 lb 1.3 oz) (10/21 1938)  Afebrile and normotensive.   Laboratory  CBC  Recent Labs  08/18/14 1125 08/18/14 1801  WBC 6.8 10.8*  HGB 10.5* 10.7*  HCT 33.8* 35.1*  PLT 300 273   BMET  Recent Labs  08/18/14 1125  NA 139  K 3.4*  CL 99  CO2 25  GLUCOSE 105*  BUN 15  CREATININE 0.95  CALCIUM 9.2  repeat labs for this morning (CBC, BMET, Hepatic function pending)     Physical Exam General appearance: alert, cooperative, no distress and moderately obese Eyes: no scleral icterus Resp: clear to auscultation bilaterally Cardio: regular rate and rhythm GI: soft, obese, non-distended, quiet, midline wound vac in place. incision clean with expected tenderness and trace serous drainage. Clear bilious material in suction canister with total output of 600 cc since placement 08/18/2014. No N/V.  Skin: Skin color, texture, turgor normal. No rashes or lesions Neurologic: Grossly normal, on CIWA protocol. No tremors, asterixis, agitation or seizure activity Incision/Wound:clean with minimal serous drainage from wound vac.   Assessment/Plan:  POD # 1 from exp lap with patch repair of perforated marginal ulcer.  1. Patient to remain NPO with ice chips only and NGT x 5 days. Order placed to clamp tube to facilitate ambulation and then to return to suction once done with ambulation.  Foley removed this a.m. No void yet. Wound vac dressing change M-W-F. Incision currently looks good with minimal drainage.  2. Mild hypokalemia yesterday and 20 meq K+ added to IVF. Will recheck K+ today. 3. Mild elevation of liver transaminases that I suspect is from chronic alcoholism. Will continue to follow. 4. On CIWA protocol and without evidence of DTs or seizure activity. Still with moderate anxiety, however this is chronic issue and patient has prn Ativan for this. May also use prn ativan for insomnia.  5. Pain improving on PCA.      Ardis Rowan, PA [facility] Surgery Pager [phone]  08/19/2014

## 2014-08-19 NOTE — Progress Notes (Signed)
0230 Patient very anxious. Patient c/o pain rating her pain a 7. Patient states she is not getting pain relief from the fentanyl and she could not rest. Dr. Harlon Florseui noitified and order received. [id number] Fentanyl 100mcg given IV. Will continue to monitor.

## 2014-08-19 NOTE — Care Management Note (Signed)
  Page 2 of 2   08/24/2014     10:58:01 AM CARE MANAGEMENT NOTE 08/24/2014  Patient:  Beth [name], Beth [name]   Account Number:  [id number]  Date Initiated:  08/19/2014  Documentation initiated by:  Ronny Flurry  Subjective/Objective Assessment:     Action/Plan:   Anticipated DC Date:     Anticipated DC Plan:  HOME W HOME HEALTH SERVICES         Choice offered to / List presented to:  C-1 Patient        HH arranged  HH-1 RN      [facility] [place] agency  [facility]   Status of service:   Medicare Important Message given?   (If response is "NO", the following Medicare IM given date fields will be blank) Date Medicare IM given:   Medicare IM given by:   Date Additional Medicare IM given:   Additional Medicare IM given by:    Discharge Disposition:    Per UR Regulation:  Reviewed for med. necessity/level of care/duration of stay  If discussed at Long Length of Stay Meetings, dates discussed:   08/24/2014    Comments: 08-24-14 Dianne from [facility] called, [facility] has been released and will be delivered this evening. Ronny Flurry RN BSN [phone]       08-24-14 [facility] requesting [place] [facility] Request for Prior Approval CMN/PA form be completed and faxed and patient's income form be re faxed . Same done. Ronny Flurry RN BSN    08-23-14 [facility] application faxed . Late note , on 08-20-14 discussed home health with patient and her parents , and confirmed face sheet information. Ronny Flurry RN BSN   08-19-14 Spoke to [facility] regarding VAC . VAC prescription form in shadow chart if VAC needed at DC please sign . VAC due to be change 08-20-14 will get measurements at that time .  Buddy Duty  [facility] Community Health & Eligibility Specialist ) saw patient in ED regarding primary care resources and the [facility] orange card. See note . At discharge will assist with [facility] letter and [facility] and [facility].  Ronny Flurry RN SBN [phone]

## 2014-08-19 NOTE — Op Note (Signed)
[name], [name]                  ACCOUNT NO.:  [id number]  MEDICAL RECORD NO.:  [id number]  LOCATION:  6N22C                        FACILITY:  [facility]  PHYSICIAN:  Abigail Miyamotoouglas Karion Cudd, M.D. DATE OF BIRTH:  15-Nov-1980  DATE OF PROCEDURE:  08/18/2014 DATE OF DISCHARGE:                              OPERATIVE REPORT   PREOPERATIVE DIAGNOSIS:  Perforated viscus.  POSTOPERATIVE DIAGNOSIS:  Perforated marginal ulcer.  PROCEDURE: 1. Exploratory laparotomy. 2. Oversew and patch repair of perforated marginal ulcer.  SURGEON:  Abigail Miyamotoouglas Navy Belay, M.D.  ASSISTANT:  Wilmon ArmsMatthew K. Tsuei, M.D.  ANESTHESIA:  General endotracheal anesthesia.  ESTIMATED BLOOD LOSS:  Minimal.  INDICATIONS:  This is a 33 year old female who has had previous gastric bypass surgery and has had perforations of ulcers in the past.  She now presents with a perforated ulcer.  This perforation was confirmed with a CAT scan of the abdomen and pelvis.  Decision was made to proceed to the operating room for emergent exploration.  FINDINGS:  The patient was found to have a large amount of gross contamination of gastric contents from a perforated marginal ulcer which was repaired with oversew and omental patch.  PROCEDURE IN DETAIL:  The patient was brought to the operating room, identified as Beth [name].  She was placed supine on the operating room table, and general anesthesia was induced.  Her abdomen was then prepped and draped in usual sterile fashion.  I created an upper midline incision with a scalpel.  I took this down through the fascia with the electrocautery.  The peritoneum was opened entirely with incision.  Upon entering the abdomen, the patient was found to have a large amount of gastric contents throughout the abdominal cavity which was suctioned free.  I then had to take down omentum from the left upper quadrant to the abdominal wall with the cautery.  I was then able to identify the small gastric pouch with  a gastrojejunostomy.  There was a perforation at the edge of the gastrojejunostomy.  We were able to get an NG tube pass this area.  I then closed the perforation with interrupted 2-0 silk sutures.  I then placed an omental patch over top of this and sutured it in place with 2-0 silk sutures as well.  Good closure of the perforation appeared to be achieved.  I then irrigated the abdomen with multiple liters of normal saline.  The patient's fascia was then closed with a running #1 looped PDS suture.  The skin was left open and a wound VAC sponge and adhesive and wound VAC device were applied to the wound.  The patient tolerated the procedure well.  All the counts were correct at the end of the procedure.  The patient was then extubated in the operating room and taken in stable condition to recovery room.     Abigail Miyamotoouglas Laterrance Nauta, M.D.     DB/MEDQ  D:  08/18/2014  T:  08/19/2014  Job:  [id number]

## 2014-08-20 ENCOUNTER — Encounter (HOSPITAL_COMMUNITY): Payer: Self-pay | Admitting: Surgery

## 2014-08-20 LAB — BASIC METABOLIC PANEL
ANION GAP: 11 (ref 5–15)
BUN: 11 mg/dL (ref 6–23)
CHLORIDE: 102 meq/L (ref 96–112)
CO2: 24 meq/L (ref 19–32)
CREATININE: 0.91 mg/dL (ref 0.50–1.10)
Calcium: 8.4 mg/dL (ref 8.4–10.5)
GFR calc Af Amer: >90 mL/min (ref >90)
GFR calc non Af Amer: 82 mL/min — ABNORMAL LOW (ref >90)
Glucose, Bld: 88 mg/dL (ref 70–99)
Potassium: 4 mEq/L (ref 3.7–5.3)
Sodium: 137 mEq/L (ref 137–147)

## 2014-08-20 LAB — CBC
HCT: 27.4 % — ABNORMAL LOW (ref 36.0–46.0)
Hemoglobin: 8.3 g/dL — ABNORMAL LOW (ref 12.0–15.0)
MCH: 21.3 pg — AB (ref 26.0–34.0)
MCHC: 30.3 g/dL (ref 30.0–36.0)
MCV: 70.3 fL — ABNORMAL LOW (ref 78.0–100.0)
PLATELETS: 304 10*3/uL (ref 150–400)
RBC: 3.9 MIL/uL (ref 3.87–5.11)
RDW: 19.3 % — AB (ref 11.5–15.5)
WBC: 13.9 10*3/uL — ABNORMAL HIGH (ref 4.0–10.5)

## 2014-08-20 MED ORDER — MENTHOL 3 MG MT LOZG
1.0000 | LOZENGE | OROMUCOSAL | Status: DC | PRN
  Filled 2014-08-20 (×2): qty 9

## 2014-08-20 NOTE — Progress Notes (Signed)
I have seen and examined the patient and agree with the assessment and plans.  Shaletha Humble A. Lorene Samaan  MD, FACS  

## 2014-08-20 NOTE — Progress Notes (Signed)
Patient ID: Beth [name], female   DOB: 1980-12-08, 33 y.o.   MRN: [id number] 2 Days Post-Op  Subjective: Pt getting up to go for walk around the unit.  Pain is better controled.  Wants apple juice.  No flatus or BM  Objective: Vital signs in last 24 hours: Temp:  [98 F (36.7 C)-98.5 F (36.9 C)] 98 F (36.7 C) (10/23 0556) Pulse Rate:  [90-99] 96 (10/23 0556) Resp:  [17-24] 17 (10/23 0556) BP: (83-138)/(52-82) 138/82 mmHg (10/23 0556) SpO2:  [95 %-100 %] 98 % (10/23 0556)    Intake/Output from previous day: 10/22 0701 - 10/23 0700 In: 3102.1 [P.O.:100; I.V.:3002.1] Out: 2250 [Urine:1400; Emesis/NG output:800; Drains:50] Intake/Output this shift: Total I/O In: 255 [NG/GT:255] Out: 200 [Urine:200]  PE: Abd: obese, soft, absent BS, NGT with some bilious output, wound VAC in place  Lab Results:   Recent Labs  08/18/14 1801 08/20/14 0404  WBC 10.8* 13.9*  HGB 10.7* 8.3*  HCT 35.1* 27.4*  PLT 273 304   BMET  Recent Labs  08/19/14 1120 08/20/14 0404  NA 138 137  K 4.3 4.0  CL 102 102  CO2 20 24  GLUCOSE 117* 88  BUN 15 11  CREATININE 0.96 0.91  CALCIUM 8.2* 8.4   PT/INR No results found for this basename: LABPROT, INR,  in the last 72 hours CMP     Component Value Date/Time   NA 137 08/20/2014 0404   K 4.0 08/20/2014 0404   CL 102 08/20/2014 0404   CO2 24 08/20/2014 0404   GLUCOSE 88 08/20/2014 0404   BUN 11 08/20/2014 0404   CREATININE 0.91 08/20/2014 0404   CALCIUM 8.4 08/20/2014 0404   PROT 6.5 08/19/2014 1120   ALBUMIN 2.0* 08/19/2014 1120   AST 37 08/19/2014 1120   ALT 28 08/19/2014 1120   ALKPHOS 82 08/19/2014 1120   BILITOT 0.3 08/19/2014 1120   GFRNONAA 82* 08/20/2014 0404   GFRAA >90 08/20/2014 0404   Lipase     Component Value Date/Time   LIPASE 19 08/18/2014 1125       Studies/Results: Ct Abdomen Pelvis W Contrast  08/18/2014   CLINICAL DATA:  Abdominal pain and distention for a few hours. Nausea and vomiting for 2 days with  chills. History of gastric bypass.  EXAM: CT ABDOMEN AND PELVIS WITH CONTRAST  TECHNIQUE: Multidetector CT imaging of the abdomen and pelvis was performed using the standard protocol [id number] [facility] [id number] [facility] [address] F[facility]land[facility]ale[facility] [id number] e  Hepatobiliary:  Unremarkable  Pancreas: Unremarkable  Spleen: Unremarkable  Adrenals/Urinary Tract: Mild abnormal hypodensity in the left renal cortex. Pyelonephritis not excluded.  Stomach/Bowel: Free intraperitoneal gas and free intraperitoneal contrast concentrated in the upper abdomen. The contrast itself is along the gastrojejunostomy site and excluded part of the stomach. This is highly likely to reflect proximal perforation along the stomach. The amount of free intraperitoneal gas is large.  Vascular/Lymphatic: Unremarkable  Reproductive: Poorly seen due to surrounding free fluid.  Other: Scattered free fluid in the pelvis. Intraperitoneal contrast medium in the upper abdomen.  Musculoskeletal:  Degenerative disc disease, L4-5.  IMPRESSION: 1. Free intraperitoneal gas, fluid, and contrast favoring a proximal perforation along the stomach. Prior gastric bypass. 2. Abnormal hypodensity in the left renal cortex concerning for possible pyelonephritis. Correlate with urine analysis.   Electronically Signed   By: Walt  Liebkemann M.D.   On: 08/18/2014 14:11  Anti-infectives: Anti-infectives   Start     Dose/Rate Route Frequency Ordered Stop   08/18/14 2200  piperacillin-tazobactam (ZOSYN) IVPB 3.375 g     3.375 g 12.5 mL/hr over 240 Minutes Intravenous Every 8 hours 08/18/14 1643     08/18/14 1430  [MAR Hold]  micafungin (MYCAMINE) 150 mg in sodium chloride 0.9 % 100 mL IVPB  Status:  Discontinued     (On MAR Hold since 08/18/14 1505)   150 mg 100 mL/hr over 1 Hours Intravenous Daily 08/18/14 1417 08/18/14 1750   08/18/14 1430  [MAR Hold]   piperacillin-tazobactam (ZOSYN) IVPB 3.375 g  Status:  Discontinued     (On MAR Hold since 08/18/14 1505)   3.375 g 12.5 mL/hr over 240 Minutes Intravenous 3 times per day 08/18/14 1417 08/18/14 1750   08/18/14 1345  [MAR Hold]  piperacillin-tazobactam (ZOSYN) IVPB 3.375 g     (On MAR Hold since 08/18/14 1505)   3.375 g 12.5 mL/hr over 240 Minutes Intravenous  Once 08/18/14 1333 08/18/14 1808       Assessment/Plan  1. POD 2, s/p ex lap with primary repair and graham patch for perforation at GJ anastomosis. 2. Untreated MS 3. ETOH abuse with recent DTs 4. H/o polysubstance abuse 5. anemia  Plan: 1. NGT will need to stay for 5 days, earliest it could come out is Monday.  I have discussed this with the patient and also told her why she can't drink apple juice. 2. Cont to mobilize and pulm toilet 3. VAC change on M,W,F 4. Cont CIWA protocol 5. hgb down to 8.3.  Repeat CBC in am to follow, but likely dilutional.  LOS: 2 days    Susen Haskew E 08/20/2014, 10:37 AM Pager: [phone]

## 2014-08-21 LAB — CBC
HEMATOCRIT: 28.9 % — AB (ref 36.0–46.0)
HEMOGLOBIN: 8.6 g/dL — AB (ref 12.0–15.0)
MCH: 21.5 pg — AB (ref 26.0–34.0)
MCHC: 29.8 g/dL — ABNORMAL LOW (ref 30.0–36.0)
MCV: 72.3 fL — ABNORMAL LOW (ref 78.0–100.0)
Platelets: 315 10*3/uL (ref 150–400)
RBC: 4 MIL/uL (ref 3.87–5.11)
RDW: 19.6 % — AB (ref 11.5–15.5)
WBC: 16.6 10*3/uL — ABNORMAL HIGH (ref 4.0–10.5)

## 2014-08-21 MED ORDER — KCL IN DEXTROSE-NACL 20-5-0.45 MEQ/L-%-% IV SOLN
INTRAVENOUS | Status: DC
  Administered 2014-08-21 – 2014-08-26 (×9): via INTRAVENOUS
  Filled 2014-08-21 (×11): qty 1000

## 2014-08-21 MED ORDER — PHENOL 1.4 % MT LIQD: 1.0000 | OROMUCOSAL | Status: DC | PRN

## 2014-08-21 NOTE — Progress Notes (Signed)
3 Days Post-Op  Subjective: No flatus or bm, pain fairly well controlled, has been oob  Objective: Vital signs in last 24 hours: Temp:  [98.1 F (36.7 C)-98.8 F (37.1 C)] 98.6 F (37 C) (10/24 0546) Pulse Rate:  [98-105] 98 (10/24 0546) Resp:  [17-34] 20 (10/24 0759) BP: (103-124)/(62-71) 124/71 mmHg (10/24 0546) SpO2:  [91 %-100 %] 97 % (10/24 0759) Last BM Date: 08/17/14  Intake/Output from previous day: 10/23 0701 - 10/24 0700 In: 3807.5 [P.O.:200; I.V.:3312.5; NG/GT:295] Out: 2295 [Urine:1400; Emesis/NG output:855; Drains:40] Intake/Output this shift: Total I/O In: -  Out: 200 [Urine:200]  General appearance: no distress Resp: clear to auscultation bilaterally Cardio: regular rate and rhythm GI: vac in place, no bs, approp tender  Lab Results:   Recent Labs  08/20/14 0404 08/21/14 0344  WBC 13.9* 16.6*  HGB 8.3* 8.6*  HCT 27.4* 28.9*  PLT 304 315   BMET  Recent Labs  08/19/14 1120 08/20/14 0404  NA 138 137  K 4.3 4.0  CL 102 102  CO2 20 24  GLUCOSE 117* 88  BUN 15 11  CREATININE 0.96 0.91  CALCIUM 8.2* 8.4   PT/INR No results found for this basename: LABPROT, INR,  in the last 72 hours ABG No results found for this basename: PHART, PCO2, PO2, HCO3,  in the last 72 hours  Studies/Results: No results found.  Anti-infectives: Anti-infectives   Start     Dose/Rate Route Frequency Ordered Stop   08/18/14 2200  piperacillin-tazobactam (ZOSYN) IVPB 3.375 g     3.375 g 12.5 mL/hr over 240 Minutes Intravenous Every 8 hours 08/18/14 1643     08/18/14 1430  [MAR Hold]  micafungin (MYCAMINE) 150 mg in sodium chloride 0.9 % 100 mL IVPB  Status:  Discontinued     (On MAR Hold since 08/18/14 1505)   150 mg 100 mL/hr over 1 Hours Intravenous Daily 08/18/14 1417 08/18/14 1750   08/18/14 1430  [MAR Hold]  piperacillin-tazobactam (ZOSYN) IVPB 3.375 g  Status:  Discontinued     (On MAR Hold since 08/18/14 1505)   3.375 g 12.5 mL/hr over 240 Minutes  Intravenous 3 times per day 08/18/14 1417 08/18/14 1750   08/18/14 1345  [MAR Hold]  piperacillin-tazobactam (ZOSYN) IVPB 3.375 g     (On MAR Hold since 08/18/14 1505)   3.375 g 12.5 mL/hr over 240 Minutes Intravenous  Once 08/18/14 1333 08/18/14 1808      Assessment/Plan:  POD 3 s/p ex lap with primary repair and graham patch for perforation at GJ anastomosis.   1. NGT will need to stay for 5 days, earliest it could come out is Monday.she has no return of bowel function yet  2. Cont to mobilize and pulm toilet, oob and ambulate more 3. VAC change on M,W,F  4. Cont CIWA protocol  5. Abl anemia- stable today 6. Cont zosyn for 7 d postop, wbc a little up today will recheck tomorrow and follow LOS: 2 days    [facility] 08/21/2014

## 2014-08-22 ENCOUNTER — Inpatient Hospital Stay (HOSPITAL_COMMUNITY): Payer: Self-pay

## 2014-08-22 ENCOUNTER — Encounter (HOSPITAL_COMMUNITY): Payer: Self-pay | Admitting: Radiology

## 2014-08-22 LAB — CBC
HCT: 26 % — ABNORMAL LOW (ref 36.0–46.0)
Hemoglobin: 8.1 g/dL — ABNORMAL LOW (ref 12.0–15.0)
MCH: 21.7 pg — AB (ref 26.0–34.0)
MCHC: 31.2 g/dL (ref 30.0–36.0)
MCV: 69.7 fL — AB (ref 78.0–100.0)
PLATELETS: 359 10*3/uL (ref 150–400)
RBC: 3.73 MIL/uL — ABNORMAL LOW (ref 3.87–5.11)
RDW: 19.3 % — AB (ref 11.5–15.5)
WBC: 15.5 10*3/uL — ABNORMAL HIGH (ref 4.0–10.5)

## 2014-08-22 LAB — BASIC METABOLIC PANEL
ANION GAP: 12 (ref 5–15)
BUN: 4 mg/dL — ABNORMAL LOW (ref 6–23)
CALCIUM: 8 mg/dL — AB (ref 8.4–10.5)
CO2: 22 mEq/L (ref 19–32)
CREATININE: 0.69 mg/dL (ref 0.50–1.10)
Chloride: 99 mEq/L (ref 96–112)
Glucose, Bld: 111 mg/dL — ABNORMAL HIGH (ref 70–99)
Potassium: 3.7 mEq/L (ref 3.7–5.3)
SODIUM: 133 meq/L — AB (ref 137–147)

## 2014-08-22 MED ORDER — IOHEXOL 300 MG/ML  SOLN
100.0000 mL | Freq: Once | INTRAMUSCULAR | Status: AC | PRN
  Administered 2014-08-22: 100 mL via INTRAVENOUS

## 2014-08-22 MED ORDER — IOHEXOL 300 MG/ML  SOLN
25.0000 mL | INTRAMUSCULAR | Status: AC
  Administered 2014-08-22 (×2): 25 mL via ORAL

## 2014-08-22 NOTE — Progress Notes (Signed)
4 Days Post-Op  Subjective: Had ng removed last night and replaced blindly, now has luq pain, fever overnight  Objective: Vital signs in last 24 hours: Temp:  [97.5 F (36.4 C)-101.2 F (38.4 C)] 101.2 F (38.4 C) (10/25 0513) Pulse Rate:  [80-97] 97 (10/25 0513) Resp:  [18-28] 18 (10/25 0757) BP: (100-134)/(67-87) 100/87 mmHg (10/25 0513) SpO2:  [93 %-98 %] 96 % (10/25 0757) Last BM Date: 08/17/14 (prior to admission)  Intake/Output from previous day: 10/24 0701 - 10/25 0700 In: 2441.7 [I.V.:2091.7; IV Piggyback:350] Out: 1970 [Urine:1125; Emesis/NG output:750; Drains:95] Intake/Output this shift:    General appearance: no distress Resp: clear to auscultation bilaterally Cardio: regular rate and rhythm GI: soft tender luq vac in place no bs  Lab Results:   Recent Labs  08/20/14 0404 08/21/14 0344  WBC 13.9* 16.6*  HGB 8.3* 8.6*  HCT 27.4* 28.9*  PLT 304 315   BMET  Recent Labs  08/19/14 1120 08/20/14 0404  NA 138 137  K 4.3 4.0  CL 102 102  CO2 20 24  GLUCOSE 117* 88  BUN 15 11  CREATININE 0.96 0.91  CALCIUM 8.2* 8.4   PT/INR No results found for this basename: LABPROT, INR,  in the last 72 hours ABG No results found for this basename: PHART, PCO2, PO2, HCO3,  in the last 72 hours  Studies/Results: No results found.  Anti-infectives: Anti-infectives   Start     Dose/Rate Route Frequency Ordered Stop   08/18/14 2200  piperacillin-tazobactam (ZOSYN) IVPB 3.375 g     3.375 g 12.5 mL/hr over 240 Minutes Intravenous Every 8 hours 08/18/14 1643     08/18/14 1430  [MAR Hold]  micafungin (MYCAMINE) 150 mg in sodium chloride 0.9 % 100 mL IVPB  Status:  Discontinued     (On MAR Hold since 08/18/14 1505)   150 mg 100 mL/hr over 1 Hours Intravenous Daily 08/18/14 1417 08/18/14 1750   08/18/14 1430  [MAR Hold]  piperacillin-tazobactam (ZOSYN) IVPB 3.375 g  Status:  Discontinued     (On MAR Hold since 08/18/14 1505)   3.375 g 12.5 mL/hr over 240 Minutes  Intravenous 3 times per day 08/18/14 1417 08/18/14 1750   08/18/14 1345  [MAR Hold]  piperacillin-tazobactam (ZOSYN) IVPB 3.375 g     (On MAR Hold since 08/18/14 1505)   3.375 g 12.5 mL/hr over 240 Minutes Intravenous  Once 08/18/14 1333 08/18/14 1808      Assessment/Plan: POD 4 s/p ex lap with primary repair and graham patch for perforation at GJ anastomosis.   1. Neuro- continue pca for now 2. pulm toilet 3. Vac change m/w/f 4. ciwa protocol 5. I am concerned about blind replacement of ng, new fever and worse luq pain, needs ct asap this am 6. Cont zosyn 7. Check cbc and bmet this am  [facility] 08/22/2014

## 2014-08-23 LAB — URINALYSIS, ROUTINE W REFLEX MICROSCOPIC
Bilirubin Urine: NEGATIVE
Glucose, UA: NEGATIVE mg/dL
Hgb urine dipstick: NEGATIVE
Ketones, ur: NEGATIVE mg/dL
Leukocytes, UA: NEGATIVE
NITRITE: NEGATIVE
Protein, ur: NEGATIVE mg/dL
SPECIFIC GRAVITY, URINE: 1.01 (ref 1.005–1.030)
Urobilinogen, UA: 1 mg/dL (ref 0.0–1.0)
pH: 7 (ref 5.0–8.0)

## 2014-08-23 MED ORDER — FENTANYL CITRATE 0.05 MG/ML IJ SOLN
50.0000 ug | INTRAMUSCULAR | Status: DC | PRN
  Administered 2014-08-23 (×5): 100 ug via INTRAVENOUS
  Filled 2014-08-23 (×4): qty 2

## 2014-08-23 NOTE — Progress Notes (Signed)
Patient ID: Beth PoliKrista A Demetrius, female   DOB: Jun 10, 1981, 33 y.o.   MRN: [id number] 5 Days Post-Op  Subjective: Pt feels much better today.  Has had 4 good BMs and passing flatus.  Pain much better today.  Decrease in NGT output  Objective: Vital signs in last 24 hours: Temp:  [98.4 F (36.9 C)-100 F (37.8 C)] 99.1 F (37.3 C) (10/26 0511) Pulse Rate:  [86-95] 86 (10/26 0511) Resp:  [18-28] 24 (10/26 0839) BP: (102-121)/(63-76) 118/63 mmHg (10/26 0511) SpO2:  [96 %-100 %] 97 % (10/26 0839) Last BM Date: 08/17/14  Intake/Output from previous day: 10/25 0701 - 10/26 0700 In: 2618.3 [I.V.:1798.3; IV Piggyback:100] Out: 1300 [Urine:650; Emesis/NG output:550; Drains:100] Intake/Output this shift:    PE: Abd: soft, minimally tender, VAC in place, +BS  Lab Results:   Recent Labs  08/21/14 0344 08/22/14 1014  WBC 16.6* 15.5*  HGB 8.6* 8.1*  HCT 28.9* 26.0*  PLT 315 359   BMET  Recent Labs  08/22/14 1014  NA 133*  K 3.7  CL 99  CO2 22  GLUCOSE 111*  BUN 4*  CREATININE 0.69  CALCIUM 8.0*   PT/INR No results found for this basename: LABPROT, INR,  in the last 72 hours CMP     Component Value Date/Time   NA 133* 08/22/2014 1014   K 3.7 08/22/2014 1014   CL 99 08/22/2014 1014   CO2 22 08/22/2014 1014   GLUCOSE 111* 08/22/2014 1014   BUN 4* 08/22/2014 1014   CREATININE 0.69 08/22/2014 1014   CALCIUM 8.0* 08/22/2014 1014   PROT 6.5 08/19/2014 1120   ALBUMIN 2.0* 08/19/2014 1120   AST 37 08/19/2014 1120   ALT 28 08/19/2014 1120   ALKPHOS 82 08/19/2014 1120   BILITOT 0.3 08/19/2014 1120   GFRNONAA >90 08/22/2014 1014   GFRAA >90 08/22/2014 1014   Lipase     Component Value Date/Time   LIPASE 19 08/18/2014 1125       Studies/Results: Ct Abdomen Pelvis W Contrast  08/22/2014   CLINICAL DATA:  Left upper abdominal pain inadvertent NG tube removal. NG tube recently replaced. History of perforated marginal gastric ulcer, status post exploratory laparotomy and  repair.  EXAM: CT ABDOMEN AND PELVIS WITH CONTRAST  TECHNIQUE: Multidetector CT imaging of the abdomen and pelvis was performed using the standard protocol following bolus administration of intravenous contrast.  CONTRAST:  100mL OMNIPAQUE IOHEXOL 300 MG/ML  SOLN  COMPARISON:  08/18/2014  FINDINGS: Lower chest: Increased areas of bibasilar atelectasis/ consolidation. Trace pleural effusions dependently. Normal heart size. No pericardial effusion. No pneumothorax.  Abdomen: NG tube enters the small stomach. Patient is status post previous gastric bypass surgery. Postop changes of the anterior abdominal wall from laparotomy. Small amount of scattered perihepatic ascites. Small amount of fluid noted along the left hepatic lobe and adjacent duodenum. Strandy edema and fluid throughout the anterior mesenteric and omentum. No significant pneumoperitoneum when compared to the prior study. Small amount of entrapped gas within the omentum, image 39. No contrast extravasation. Negative for bowel obstruction. No evidence of recurrent perforation.  Liver, gallbladder, biliary system, pancreas, spleen, adrenal glands, kidneys are within normal limits for age and demonstrate no acute process.  Pelvis: Small amount of scattered pelvic free fluid/ascites. This is likely postoperative. Urinary bladder demonstrates a small amount of air anteriorly, suspect related to instrumentation/Foley catheter previously. Uterus and adnexa unremarkable. No adenopathy, definite abscess, inguinal abnormality, or hernia.  No acute osseous finding.  IMPRESSION: Replacement of NG  tube within the small residual stomach. Status post gastric bypass surgery and recent repair of a perforated marginal ulcer.  No evidence of recurrent perforation, significant free air, or contrast extravasation.  Increased bibasilar areas of atelectasis/consolidation and trace pleural effusions.  Scattered upper abdominal free fluid about the liver and within the anterior  mesentery/ omentum, suspect postoperative.  Small amount of lower abdominal and pelvic ascites as well.  Negative for bowel obstruction or definite abscess   Electronically Signed   By: Ruel Favors M.D.   On: 08/22/2014 13:19    Anti-infectives: Anti-infectives   Start     Dose/Rate Route Frequency Ordered Stop   08/18/14 2200  piperacillin-tazobactam (ZOSYN) IVPB 3.375 g     3.375 g 12.5 mL/hr over 240 Minutes Intravenous Every 8 hours 08/18/14 1643     08/18/14 1430  [MAR Hold]  micafungin (MYCAMINE) 150 mg in sodium chloride 0.9 % 100 mL IVPB  Status:  Discontinued     (On MAR Hold since 08/18/14 1505)   150 mg 100 mL/hr over 1 Hours Intravenous Daily 08/18/14 1417 08/18/14 1750   08/18/14 1430  [MAR Hold]  piperacillin-tazobactam (ZOSYN) IVPB 3.375 g  Status:  Discontinued     (On MAR Hold since 08/18/14 1505)   3.375 g 12.5 mL/hr over 240 Minutes Intravenous 3 times per day 08/18/14 1417 08/18/14 1750   08/18/14 1345  [MAR Hold]  piperacillin-tazobactam (ZOSYN) IVPB 3.375 g     (On MAR Hold since 08/18/14 1505)   3.375 g 12.5 mL/hr over 240 Minutes Intravenous  Once 08/18/14 1333 08/18/14 1808       Assessment/Plan  1. POD 5, s/p ex lap with repair of perforated GJ anastomosis 2. Post op ileus, resolved 3. EOTH abuse 4. H/o polysubstance abuse 5. Untreated MS  Plan: 1. Will DC NGT tube today and will give clear liquids 2. She no longer wants her PCA, will stop this and give IV pushes for now until she is able to tolerate more than just liquids 3. Cont mobilization and pulmonary toileting. 4. lovenox daily for DVT prophylaxis. 5. Zosyn D6/7.    LOS: 5 days    Dawnette Mione E 08/23/2014, 10:28 AM Pager: [phone]

## 2014-08-24 ENCOUNTER — Inpatient Hospital Stay (HOSPITAL_COMMUNITY): Payer: Self-pay

## 2014-08-24 LAB — BASIC METABOLIC PANEL
ANION GAP: 14 (ref 5–15)
BUN: <3 mg/dL — ABNORMAL LOW (ref 6–23)
CHLORIDE: 97 meq/L (ref 96–112)
CO2: 25 meq/L (ref 19–32)
Calcium: 8.4 mg/dL (ref 8.4–10.5)
Creatinine, Ser: 0.77 mg/dL (ref 0.50–1.10)
GFR calc Af Amer: >90 mL/min (ref >90)
GFR calc non Af Amer: >90 mL/min (ref >90)
Glucose, Bld: 81 mg/dL (ref 70–99)
Potassium: 3.5 mEq/L — ABNORMAL LOW (ref 3.7–5.3)
Sodium: 136 mEq/L — ABNORMAL LOW (ref 137–147)

## 2014-08-24 LAB — CBC
HCT: 26 % — ABNORMAL LOW (ref 36.0–46.0)
Hemoglobin: 7.8 g/dL — ABNORMAL LOW (ref 12.0–15.0)
MCH: 20.6 pg — ABNORMAL LOW (ref 26.0–34.0)
MCHC: 30 g/dL (ref 30.0–36.0)
MCV: 68.8 fL — ABNORMAL LOW (ref 78.0–100.0)
PLATELETS: 631 10*3/uL — AB (ref 150–400)
RBC: 3.78 MIL/uL — AB (ref 3.87–5.11)
RDW: 19.2 % — ABNORMAL HIGH (ref 11.5–15.5)
WBC: 16.5 10*3/uL — AB (ref 4.0–10.5)

## 2014-08-24 MED ORDER — POTASSIUM CHLORIDE 20 MEQ/15ML (10%) PO LIQD
40.0000 meq | Freq: Two times a day (BID) | ORAL | Status: AC
  Administered 2014-08-24 (×2): 40 meq via ORAL
  Filled 2014-08-24 (×2): qty 30

## 2014-08-24 MED ORDER — OXYCODONE-ACETAMINOPHEN 5-325 MG PO TABS
1.0000 | ORAL_TABLET | ORAL | Status: DC | PRN
  Administered 2014-08-25 – 2014-08-26 (×10): 2 via ORAL
  Filled 2014-08-24 (×11): qty 2

## 2014-08-24 NOTE — Progress Notes (Signed)
Pt very anxious/tearful about starting oral pain meds.  I explained to pt that they will last longer than the IV pain meds. Pt states she has taken percocet before and is worried her body is "used" to it.  Pt states she is willing to try the oral pain meds after she gets something on her stomach tonight after dinner.  Will encourage oral pain med use. Vanice Sarahhompson, Conner Muegge L

## 2014-08-24 NOTE — Progress Notes (Signed)
Patient ID: Beth [name], female   DOB: April 12, 1981, 33 y.o.   MRN: [id number] 6 Days Post-Op  Subjective: Pt feels much better today with fentanyl changed to morphine.  Tolerating clear liquids well.    Objective: Vital signs in last 24 hours: Temp:  [98 F (36.7 C)-99.1 F (37.3 C)] 98.6 F (37 C) (10/27 0533) Pulse Rate:  [90-92] 92 (10/27 0533) Resp:  [18-26] 18 (10/27 0533) BP: (113-127)/(58-76) 113/58 mmHg (10/27 0533) SpO2:  [95 %-97 %] 97 % (10/27 0533) Last BM Date: 08/23/14  Intake/Output from previous day: 10/26 0701 - 10/27 0700 In: 714.1 [P.O.:240; I.V.:474.1] Out: 1000 [Urine:950; Drains:50] Intake/Output this shift:    PE: Abd: soft, appropriately tender, +BS, ND, obese, wound VAC in place Heart: regular Lungs: Few rhonchi as bases  Lab Results:   Recent Labs  08/22/14 1014 08/24/14 0410  WBC 15.5* 16.5*  HGB 8.1* 7.8*  HCT 26.0* 26.0*  PLT 359 631*   BMET  Recent Labs  08/22/14 1014 08/24/14 0410  NA 133* 136*  K 3.7 3.5*  CL 99 97  CO2 22 25  GLUCOSE 111* 81  BUN 4* <3*  CREATININE 0.69 0.77  CALCIUM 8.0* 8.4   PT/INR No results found for this basename: LABPROT, INR,  in the last 72 hours CMP     Component Value Date/Time   NA 136* 08/24/2014 0410   K 3.5* 08/24/2014 0410   CL 97 08/24/2014 0410   CO2 25 08/24/2014 0410   GLUCOSE 81 08/24/2014 0410   BUN <3* 08/24/2014 0410   CREATININE 0.77 08/24/2014 0410   CALCIUM 8.4 08/24/2014 0410   PROT 6.5 08/19/2014 1120   ALBUMIN 2.0* 08/19/2014 1120   AST 37 08/19/2014 1120   ALT 28 08/19/2014 1120   ALKPHOS 82 08/19/2014 1120   BILITOT 0.3 08/19/2014 1120   GFRNONAA >90 08/24/2014 0410   GFRAA >90 08/24/2014 0410   Lipase     Component Value Date/Time   LIPASE 19 08/18/2014 1125       Studies/Results: Ct Abdomen Pelvis W Contrast  08/22/2014   CLINICAL DATA:  Left upper abdominal pain inadvertent NG tube removal. NG tube recently replaced. History of perforated marginal  gastric ulcer, status post exploratory laparotomy and repair.  EXAM: CT ABDOMEN AND PELVIS WITH CONTRAST  TECHNIQUE: Multidetector CT imaging of the abdomen and pelvis was performed using the standard protocol following bolus administration of intravenous contrast.  CONTRAST:  OMNIPAQUE IOHEXOL 300 MG/ML  SOLN  COMPARISON:  08/18/2014  FINDINGS: Lower chest: Increased areas of bibasilar atelectasis/ consolidation. Trace pleural effusions dependently. Normal heart size. No pericardial effusion. No pneumothorax.  Abdomen: NG tube enters the small stomach. Patient is status post previous gastric bypass surgery. Postop changes of the anterior abdominal wall from laparotomy. Small amount of scattered perihepatic ascites. Small amount of fluid noted along the left hepatic lobe and adjacent duodenum. Strandy edema and fluid throughout the anterior mesenteric and omentum. No significant pneumoperitoneum when compared to the prior study. Small amount of entrapped gas within the omentum, image 39. No contrast extravasation. Negative for bowel obstruction. No evidence of recurrent perforation.  Liver, gallbladder, biliary system, pancreas, spleen, adrenal glands, kidneys are within normal limits for age and demonstrate no acute process.  Pelvis: Small amount of scattered pelvic free fluid/ascites. This is likely postoperative. Urinary bladder demonstrates a small amount of air anteriorly, suspect related to instrumentation/Foley catheter previously. Uterus and adnexa unremarkable. No adenopathy, definite abscess, inguinal abnormality, or hernia.  No acute osseous finding.  IMPRESSION: Replacement of NG tube within the small residual stomach. Status post gastric bypass surgery and recent repair of a perforated marginal ulcer.  No evidence of recurrent perforation, significant free air, or contrast extravasation.  Increased bibasilar areas of atelectasis/consolidation and trace pleural effusions.  Scattered upper abdominal  free fluid about the liver and within the anterior mesentery/ omentum, suspect postoperative.  Small amount of lower abdominal and pelvic ascites as well.  Negative for bowel obstruction or definite abscess   Electronically Signed   By: Ruel Favorsrevor  Shick M.D.   On: 08/22/2014 13:19    Anti-infectives: Anti-infectives   Start     Dose/Rate Route Frequency Ordered Stop   08/18/14 2200  piperacillin-tazobactam (ZOSYN) IVPB 3.375 g     3.375 g 12.5 mL/hr over 240 Minutes Intravenous Every 8 hours 08/18/14 1643     08/18/14 1430  [MAR Hold]  micafungin (MYCAMINE) 150 mg in sodium chloride 0.9 % 100 mL IVPB  Status:  Discontinued     (On MAR Hold since 08/18/14 1505)   150 mg 100 mL/hr over 1 Hours Intravenous Daily 08/18/14 1417 08/18/14 1750   08/18/14 1430  [MAR Hold]  piperacillin-tazobactam (ZOSYN) IVPB 3.375 g  Status:  Discontinued     (On MAR Hold since 08/18/14 1505)   3.375 g 12.5 mL/hr over 240 Minutes Intravenous 3 times per day 08/18/14 1417 08/18/14 1750   08/18/14 1345  [MAR Hold]  piperacillin-tazobactam (ZOSYN) IVPB 3.375 g     (On MAR Hold since 08/18/14 1505)   3.375 g 12.5 mL/hr over 240 Minutes Intravenous  Once 08/18/14 1333 08/18/14 1808       Assessment/Plan   1. POD 6, s/p ex lap with repair of perforated GJ anastomosis  2. Post op ileus, resolved  3. EOTH abuse  4. H/o polysubstance abuse  5. Untreated MS 6. Leukocytosis 7. Hypokalemia  Plan: 1. Replace K and check bmet in am 2. Advance to full liquids 3. WBC stable around 16K.  CT of A/P two days ago was normal and UA was normal.  She has a few crackles in her bases.  Will check PCXR, but suspect this is likely atelectasis.   4. Will d/w Dr. Lindie SpruceWyatt about abx therapy.  Her zosyn is supposed to stop today. 5. Cont to mobilize and pulm toilet 6. Change to oral narcotics   LOS: 6 days    [name] E 08/24/2014, 9:32 AM Pager: [phone]

## 2014-08-24 NOTE — Progress Notes (Signed)
She actually looks pretty good.  Probably to go home tomorrow.  Marta Lamas. Gae Bon, MD, FACS [phone] [facility]

## 2014-08-25 ENCOUNTER — Inpatient Hospital Stay (HOSPITAL_COMMUNITY): Payer: BC Managed Care – PPO

## 2014-08-25 LAB — URINALYSIS, ROUTINE W REFLEX MICROSCOPIC
Bilirubin Urine: NEGATIVE
GLUCOSE, UA: NEGATIVE mg/dL
HGB URINE DIPSTICK: NEGATIVE
Ketones, ur: NEGATIVE mg/dL
LEUKOCYTES UA: NEGATIVE
NITRITE: NEGATIVE
PROTEIN: NEGATIVE mg/dL
SPECIFIC GRAVITY, URINE: 1.017 (ref 1.005–1.030)
Urobilinogen, UA: 1 mg/dL (ref 0.0–1.0)
pH: 6 (ref 5.0–8.0)

## 2014-08-25 LAB — BASIC METABOLIC PANEL
Anion gap: 14 (ref 5–15)
BUN: <3 mg/dL — ABNORMAL LOW (ref 6–23)
CO2: 24 meq/L (ref 19–32)
Calcium: 8.6 mg/dL (ref 8.4–10.5)
Chloride: 97 mEq/L (ref 96–112)
Creatinine, Ser: 0.74 mg/dL (ref 0.50–1.10)
GFR calc Af Amer: >90 mL/min (ref >90)
GFR calc non Af Amer: >90 mL/min (ref >90)
GLUCOSE: 84 mg/dL (ref 70–99)
Potassium: 4 mEq/L (ref 3.7–5.3)
SODIUM: 135 meq/L — AB (ref 137–147)

## 2014-08-25 LAB — CBC
HCT: 27.1 % — ABNORMAL LOW (ref 36.0–46.0)
HEMOGLOBIN: 8.4 g/dL — AB (ref 12.0–15.0)
MCH: 22.3 pg — ABNORMAL LOW (ref 26.0–34.0)
MCHC: 31 g/dL (ref 30.0–36.0)
MCV: 71.9 fL — ABNORMAL LOW (ref 78.0–100.0)
Platelets: 853 10*3/uL — ABNORMAL HIGH (ref 150–400)
RBC: 3.77 MIL/uL — AB (ref 3.87–5.11)
RDW: 19.5 % — ABNORMAL HIGH (ref 11.5–15.5)
WBC: 18.7 10*3/uL — AB (ref 4.0–10.5)

## 2014-08-25 LAB — URINE MICROSCOPIC-ADD ON

## 2014-08-25 MED ORDER — PHENAZOPYRIDINE HCL 100 MG PO TABS
100.0000 mg | ORAL_TABLET | Freq: Three times a day (TID) | ORAL | Status: DC
  Administered 2014-08-25 – 2014-08-26 (×4): 100 mg via ORAL
  Filled 2014-08-25 (×6): qty 1

## 2014-08-25 MED ORDER — MORPHINE SULFATE 2 MG/ML IJ SOLN
2.0000 mg | Freq: Once | INTRAMUSCULAR | Status: AC
  Administered 2014-08-25: 2 mg via INTRAVENOUS
  Filled 2014-08-25: qty 1

## 2014-08-25 MED ORDER — SIMETHICONE 80 MG PO CHEW
40.0000 mg | CHEWABLE_TABLET | ORAL | Status: DC | PRN
  Administered 2014-08-25 – 2014-08-26 (×3): 40 mg via ORAL
  Filled 2014-08-25 (×3): qty 1

## 2014-08-25 NOTE — Progress Notes (Signed)
7 Days Post-Op  Subjective: Pain under control with current regimen, however, patient has quite a bit of anxiety with regard to pain. Expresses concern and anxiety about development of pain if she switches to oral pain meds. Has also developed urinary frequency, dysuria, urgency and suprapubic discomfort when emptying bladder. WBC increased today to 18.7, but remains afebrile. Still on Zosyn. Tolerating full liquid diet. No N/V or abdominal distension. Ambulatory.   Objective: Vital signs in last 24 hours: Temp:  [97.9 F (36.6 C)-98.4 F (36.9 C)] 97.9 F (36.6 C) (10/28 0500) Pulse Rate:  [88-119] 88 (10/28 0500) Resp:  [18-20] 20 (10/28 0500) BP: (103-128)/(62-75) 103/75 mmHg (10/28 0500) SpO2:  [94 %-98 %] 94 % (10/28 0500) Last BM Date: 08/23/14  Intake/Output from previous day: 10/27 0701 - 10/28 0700 In: 1680 [P.O.:480; I.V.:1200] Out: 75 [Drains:75] Intake/Output this shift:    PE: Card: RRR Pulm: CTA B/L Abd: soft, NTND, obese, NABS, wound vac in place with clean edges. No CVAT.  Lab Results:   Recent Labs  08/24/14 0410 08/25/14 0424  WBC 16.5* 18.7*  HGB 7.8* 8.4*  HCT 26.0* 27.1*  PLT 631* 853*   BMET  Recent Labs  08/24/14 0410 08/25/14 0424  NA 136* 135*  K 3.5* 4.0  CL 97 97  CO2 25 24  GLUCOSE 81 84  BUN <3* <3*  CREATININE 0.77 0.74  CALCIUM 8.4 8.6   PT/INR No results found for this basename: LABPROT, INR,  in the last 72 hours CMP     Component Value Date/Time   NA 135* 08/25/2014 0424   K 4.0 08/25/2014 0424   CL 97 08/25/2014 0424   CO2 24 08/25/2014 0424   GLUCOSE 84 08/25/2014 0424   BUN <3* 08/25/2014 0424   CREATININE 0.74 08/25/2014 0424   CALCIUM 8.6 08/25/2014 0424   PROT 6.5 08/19/2014 1120   ALBUMIN 2.0* 08/19/2014 1120   AST 37 08/19/2014 1120   ALT 28 08/19/2014 1120   ALKPHOS 82 08/19/2014 1120   BILITOT 0.3 08/19/2014 1120   GFRNONAA >90 08/25/2014 0424   GFRAA >90 08/25/2014 0424   Lipase     Component  Value Date/Time   LIPASE 19 08/18/2014 1125       Studies/Results: Dg Chest Port 1 View  08/24/2014   CLINICAL DATA:  Leukocytosis, dyspnea, post abdominal surgery  EXAM: PORTABLE CHEST - 1 VIEW  COMPARISON:  Portable exam 1142 hr without priors for comparison.  FINDINGS: Mildly prominent size of cardiac silhouette accentuated by hypoinflation.  Mediastinal contours and pulmonary vascularity normal.  Decreased lung volumes with bibasilar atelectasis.  Upper lungs clear.  No pleural effusion or pneumothorax.  IMPRESSION: Decreased lung volumes with bibasilar atelectasis.   Electronically Signed   By: Ulyses Southward M.D.   On: 08/24/2014 12:04    Anti-infectives: Anti-infectives   Start     Dose/Rate Route Frequency Ordered Stop   08/18/14 2200  piperacillin-tazobactam (ZOSYN) IVPB 3.375 g     3.375 g 12.5 mL/hr over 240 Minutes Intravenous Every 8 hours 08/18/14 1643 08/25/14 0114   08/18/14 1430  [MAR Hold]  micafungin (MYCAMINE) 150 mg in sodium chloride 0.9 % 100 mL IVPB  Status:  Discontinued     (On MAR Hold since 08/18/14 1505)   150 mg 100 mL/hr over 1 Hours Intravenous Daily 08/18/14 1417 08/18/14 1750   08/18/14 1430  [MAR Hold]  piperacillin-tazobactam (ZOSYN) IVPB 3.375 g  Status:  Discontinued     (On MAR Hold  since 08/18/14 1505)   3.375 g 12.5 mL/hr over 240 Minutes Intravenous 3 times per day 08/18/14 1417 08/18/14 1750   08/18/14 1345  [MAR Hold]  piperacillin-tazobactam (ZOSYN) IVPB 3.375 g     (On MAR Hold since 08/18/14 1505)   3.375 g 12.5 mL/hr over 240 Minutes Intravenous  Once 08/18/14 1333 08/18/14 1808       Assessment/Plan 1. POD# 7, s/p ex lap with repair of perforated GJ anastomosis  2. Post op ileus, resolved  3. ETOH abuse  4. H/o polysubstance abuse  5. Untreated MS  6. Leukocytosis  7. Hypokalemia, resolved  Plan: 1. Repeat BMET now with normal K+ (4.0) 2. Advance diet to soft today.  3. Leukocytosis worse today. CXR from 08/24/2014 normal,  however, clinically I suspect patient may be developing UTI. No clinical findings to suggest pyelonephritis. Will send stat clean catch urine with C&S and follow up. I do not feel leukocytosis is recurrent peritonitis and pt can finish last dose of Zosyn today. Unfortunately patient may require new abx therapy if UA results indicate.  4. D/C morphine and transition to oral pain meds only in effort to move forward with plans to D/C home. 5. Continue ambulation. Wound vac dressing change today.       LOS: 7 days    Ardis RowanRESSON, JENNIFER LEE, PA [facility] 08/25/2014, 8:46 AM Pager: [phone] or [phone]

## 2014-08-25 NOTE — Progress Notes (Signed)
Pt now complaining of RUQ localized pain that she says feels like air.  Pt has been getting up to walk and says that seems to help some.  Will continue to monitor. Vanice Sarah

## 2014-08-25 NOTE — Progress Notes (Signed)
High upper abdominal discomfort, but patient passing gas.  No bowel movement.  Sitting up in chair.  Will try to identify source of leukocytosis.  Marta Lamas Gae Bon, MD, FACS [phone] [facility]

## 2014-08-25 NOTE — Progress Notes (Signed)
Pt has ambulated around unit twice today and multiple times in room.  Pt has tolerated well.  Will continue to monitor. Vanice Sarah

## 2014-08-26 LAB — URINE CULTURE: SPECIAL REQUESTS: NORMAL

## 2014-08-26 LAB — CBC WITH DIFFERENTIAL/PLATELET
BASOS ABS: 0 10*3/uL (ref 0.0–0.1)
Basophils Relative: 0 % (ref 0–1)
EOS PCT: 2 % (ref 0–5)
Eosinophils Absolute: 0.4 10*3/uL (ref 0.0–0.7)
HEMATOCRIT: 23.7 % — AB (ref 36.0–46.0)
Hemoglobin: 7.1 g/dL — ABNORMAL LOW (ref 12.0–15.0)
LYMPHS PCT: 10 % — AB (ref 12–46)
Lymphs Abs: 1.9 10*3/uL (ref 0.7–4.0)
MCH: 20.8 pg — ABNORMAL LOW (ref 26.0–34.0)
MCHC: 30 g/dL (ref 30.0–36.0)
MCV: 69.3 fL — ABNORMAL LOW (ref 78.0–100.0)
MONOS PCT: 9 % (ref 3–12)
Monocytes Absolute: 1.7 10*3/uL — ABNORMAL HIGH (ref 0.1–1.0)
NEUTROS ABS: 14.5 10*3/uL — AB (ref 1.7–7.7)
Neutrophils Relative %: 79 % — ABNORMAL HIGH (ref 43–77)
Platelets: 949 10*3/uL (ref 150–400)
RBC: 3.42 MIL/uL — AB (ref 3.87–5.11)
RDW: 19.3 % — ABNORMAL HIGH (ref 11.5–15.5)
WBC: 18.5 10*3/uL — AB (ref 4.0–10.5)

## 2014-08-26 LAB — PATHOLOGIST SMEAR REVIEW

## 2014-08-26 MED ORDER — OXYCODONE-ACETAMINOPHEN 5-325 MG PO TABS: 1.0000 | ORAL_TABLET | Freq: Four times a day (QID) | ORAL | Status: DC | PRN

## 2014-08-26 MED ORDER — LIDOCAINE 5 % EX PTCH: 1.0000 | MEDICATED_PATCH | CUTANEOUS | Status: DC

## 2014-08-26 MED ORDER — METHOCARBAMOL 500 MG PO TABS
1000.0000 mg | ORAL_TABLET | Freq: Three times a day (TID) | ORAL | Status: DC | PRN
  Administered 2014-08-26: 1000 mg via ORAL
  Filled 2014-08-26: qty 2

## 2014-08-26 MED ORDER — LIDOCAINE 5 % EX PTCH
1.0000 | MEDICATED_PATCH | CUTANEOUS | Status: DC
  Administered 2014-08-26: 1 via TRANSDERMAL
  Filled 2014-08-26 (×3): qty 1

## 2014-08-26 MED ORDER — METHOCARBAMOL 500 MG PO TABS: 1000.0000 mg | ORAL_TABLET | Freq: Three times a day (TID) | ORAL | Status: DC | PRN

## 2014-08-26 MED ORDER — PHENAZOPYRIDINE HCL 100 MG PO TABS: 100.0000 mg | ORAL_TABLET | Freq: Three times a day (TID) | ORAL | Status: DC

## 2014-08-26 MED ORDER — CIPROFLOXACIN HCL 500 MG PO TABS: 500.0000 mg | ORAL_TABLET | Freq: Two times a day (BID) | ORAL | Status: DC

## 2014-08-26 MED ORDER — CIPROFLOXACIN HCL 500 MG PO TABS
500.0000 mg | ORAL_TABLET | Freq: Two times a day (BID) | ORAL | Status: DC
  Administered 2014-08-26: 500 mg via ORAL
  Filled 2014-08-26: qty 1

## 2014-08-26 NOTE — Care Management (Signed)
08-26-14 Patient and her father requesting home health RN be discontinued . Patient knows a retired Engineer, civil (consulting) who is willing to change dressings . Aris Georgia PA aware and states Dr Lindie Spruce has approved same. Rickie with [facility] aware and states approval . Miranda with [facility] aware. Ronny Flurry RN BSN [phone]

## 2014-08-26 NOTE — Progress Notes (Signed)
[facility] Progress Note  8 Days Post-Op  Subjective: Pt c/o pain/muscle spasms in the right flank/RUQ, aggravated by getting up OOB/chair.  Says its a sharp pain which radiates around her body.  Some intermittent nausea, no vomiting, appetite low.  Having flatus and BM.  Wound vac changed well.  Says shes having incomplete emptying of her bladder.  She'll get up from the toilet and have to sit right back down to urinate again.  Denies frequency, urgency, or dysuria.  She's cleaning appropriately down there and not using any fragrant soaps/irritants.  Want to know if a retired Engineer, civil (consulting) with her church can change her wound vac MWF instead of asking HH to come out.  Objective: Vital signs in last 24 hours: Temp:  [98 F (36.7 C)-98.8 F (37.1 C)] 98 F (36.7 C) (10/29 0545) Pulse Rate:  [82-103] 82 (10/29 0545) Resp:  [19-20] 19 (10/29 0545) BP: (105-123)/(60-71) 105/60 mmHg (10/29 0545) SpO2:  [95 %-96 %] 95 % (10/29 0545) Last BM Date: 08/23/14  Intake/Output from previous day: 10/28 0701 - 10/29 0700 In: 2090 [P.O.:940; I.V.:1150] Out: 80 [Drains:80] Intake/Output this shift:    PE: Gen:  Alert, NAD, pleasant Abd: Soft, ND, mild tenderness, right flank tenderness, +BS, no HSM, incisions with wound vac in place, vac cannister with serosanguinous drainage   Lab Results:   Recent Labs  08/25/14 0424 08/26/14 0615  WBC 18.7* 18.5*  HGB 8.4* 7.1*  HCT 27.1* 23.7*  PLT 853* 949*   BMET  Recent Labs  08/24/14 0410 08/25/14 0424  NA 136* 135*  K 3.5* 4.0  CL 97 97  CO2 25 24  GLUCOSE 81 84  BUN <3* <3*  CREATININE 0.77 0.74  CALCIUM 8.4 8.6   PT/INR No results found for this basename: LABPROT, INR,  in the last 72 hours CMP     Component Value Date/Time   NA 135* 08/25/2014 0424   K 4.0 08/25/2014 0424   CL 97 08/25/2014 0424   CO2 24 08/25/2014 0424   GLUCOSE 84 08/25/2014 0424   BUN <3* 08/25/2014 0424   CREATININE 0.74 08/25/2014 0424   CALCIUM 8.6 08/25/2014 0424   PROT 6.5 08/19/2014 1120   ALBUMIN 2.0* 08/19/2014 1120   AST 37 08/19/2014 1120   ALT 28 08/19/2014 1120   ALKPHOS 82 08/19/2014 1120   BILITOT 0.3 08/19/2014 1120   GFRNONAA >90 08/25/2014 0424   GFRAA >90 08/25/2014 0424   Lipase     Component Value Date/Time   LIPASE 19 08/18/2014 1125       Studies/Results: Dg Chest 2 View  08/25/2014   CLINICAL DATA:  Right-sided stabbing chest pain, worse with reading. Symptoms started today. History of recent laparotomy.  EXAM: CHEST  2 VIEW  COMPARISON:  08/24/2014  FINDINGS: Very low lung volumes with bibasilar atelectasis. Findings similar to prior study. Small bilateral effusions. No acute bony abnormality.  IMPRESSION: Low lung volumes with bibasilar atelectasis and small effusions.   Electronically Signed   By: Charlett Nose M.D.   On: 08/25/2014 19:41   Dg Chest Port 1 View  08/24/2014   CLINICAL DATA:  Leukocytosis, dyspnea, post abdominal surgery  EXAM: PORTABLE CHEST - 1 VIEW  COMPARISON:  Portable exam 1142 hr without priors for comparison.  FINDINGS: Mildly prominent size of cardiac silhouette accentuated by hypoinflation.  Mediastinal contours and pulmonary vascularity normal.  Decreased lung volumes with bibasilar atelectasis.  Upper lungs clear.  No pleural effusion or pneumothorax.  IMPRESSION:  Decreased lung volumes with bibasilar atelectasis.   Electronically Signed   By: Ulyses SouthwardMark  Boles M.D.   On: 08/24/2014 12:04    Anti-infectives: Anti-infectives   Start     Dose/Rate Route Frequency Ordered Stop   08/18/14 2200  piperacillin-tazobactam (ZOSYN) IVPB 3.375 g     3.375 g 12.5 mL/hr over 240 Minutes Intravenous Every 8 hours 08/18/14 1643 08/25/14 0114   08/18/14 1430  [MAR Hold]  micafungin (MYCAMINE) 150 mg in sodium chloride 0.9 % 100 mL IVPB  Status:  Discontinued     (On MAR Hold since 08/18/14 1505)   150 mg 100 mL/hr over 1 Hours Intravenous Daily 08/18/14 1417 08/18/14 1750   08/18/14  1430  [MAR Hold]  piperacillin-tazobactam (ZOSYN) IVPB 3.375 g  Status:  Discontinued     (On MAR Hold since 08/18/14 1505)   3.375 g 12.5 mL/hr over 240 Minutes Intravenous 3 times per day 08/18/14 1417 08/18/14 1750   08/18/14 1345  [MAR Hold]  piperacillin-tazobactam (ZOSYN) IVPB 3.375 g     (On MAR Hold since 08/18/14 1505)   3.375 g 12.5 mL/hr over 240 Minutes Intravenous  Once 08/18/14 1333 08/18/14 1808       Assessment/Plan 1. Perforated GJ anastomosis POD# 7 s/p Ex lap with repair 2. Post op ileus, resolved  3. ETOH abuse  4. H/o polysubstance abuse  5. Untreated MS  6. Leukocytosis  7. Hypokalemia, resolved  8. UTI, clinically  Plan:  1. Repeat BMET now with normal K+ (4.0)  2. Tolerating soft diet 3. Leukocytosis improved from yesterday. CXR from 08/24/2014 normal, UA contaminated. 4. Oral pain meds 5. Continue ambulation. Wound vac dressing change yesterday went well 6. Clinically she has symptoms of UTI despite her UA being essentially negative due to contamination.  Would recommend 3 days of cipro BID.  She's having incomplete emptying of bladder, and some intermittent incontinence.  Continue pyridium 7. Start robaxin and lidoderm patch for the muscle spasms she's getting in her right flank/RUQ, don't think its trapped gas at this point 8.  Ambulating well and using IS, SCD's and lovenox 9.  Will discuss with Dr. Lindie SpruceWyatt about timing of discharge   LOS: 8 days    DORT, [facility] 08/26/2014, 8:37 AM Pager: [phone]

## 2014-08-26 NOTE — Discharge Summary (Signed)
Patient ID: Beth [name] MRN: [id number] DOB/AGE: [birth year] 33 y.o.  Admit date: 08/18/2014 Discharge date: 08/26/2014  Procedures: 1. Exploratory laparotomy.  2. Oversew and patch repair of perforated marginal ulcer.  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 Onset was sudden this morning. Time pattern is constant. Severe in severity. Location is generalized. Her symptoms were preceded by fever, chills and sweats which started on Sunday night. She reports stopping alcohol about 1 week. She did not have abdominal pain prior to awakening this morning. Modifying factors include pepto bismol, tums, goody powders, aleve over the last few days. No aggravating or alleviating factors. Her work up shows intraperitoneal free air, normal white count, normal kidney function, K 3.4. We have been asked to evaluate the patient due to free air.   Admission Diagnoses:  1. Intraperitoneal free air likely from a perforated ulcer   2. Untreated Multiple Sclerosis  3. Polysubstance abuse  4. ETOH abuse  5. Anxiety  6. H/o perforated GJ anastomosis  7. H/o roux-en-y gastric bypass surgery in 2003  8. H/o DTs  Hospital Course: The patient was admitted and taken to the operating room where she underwent the above procedure.  She was then transferred to the [facility] floor with an NGT and foley in place.  She was placed on CIWA protocol due to her ETOH abuse history.  She had her NGT in place for 5 days postoperatively due to her primary repair.  On POD 4, her NGT fell out and was replaced with guided supervision.  The patient developed acute pain after this.  Due to a concern for reperforation, a CT scan was obtained.  This did not reveal free air or fluid or any other findings that would raise concern  for a new problem.  The following day the patient was feeling much better and began having multiple bowel movements.  Her NGT was then discontinued.  She was given clear liquids and her diet was able to be advanced as tolerated.  She completed a 7 day course of zosyn while here.  She did have an elevated WBC at 16K, but no reason for it.  Her CXR, UA, and CT scan were all negative.  She is not having any fevers and felt that her WBC was not indicative of any type of infection.  She did complain of some urinary symptoms.  Her UA was negative, but given persistent complaints, she was treated prophylacticaly treated with 3 days of Cipro.  She was otherwise stable on POD 8 for dc home.  Discharge Diagnoses:  Active Problems:   Free intraperitoneal air   Perforated ulcer  Patient Active Problem List   Diagnosis Date Noted  . Free intraperitoneal air 08/18/2014  . Perforated ulcer 08/18/2014  . 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 Herniated lumbar disc without myelopathy 04/13/2014  . Acid reflux   . History of blood transfusion   . Multiple sclerosis 03/13/2012  . H/O gastric bypass 03/13/2012     Discharge Medications:   Medication List    STOP taking these medications       naproxen sodium 220 MG tablet  Commonly known as:  ANAPROX      TAKE these medications       ciprofloxacin 500 MG tablet  Commonly known as:  CIPRO  Take 1 tablet (500 mg total) by mouth 2 (two) times daily.     lidocaine 5 %  Commonly known as:  LIDODERM  Place 1 patch onto the skin daily. Remove & Discard patch within 12 hours or as directed by MD     methocarbamol 500 MG tablet  Commonly known as:  ROBAXIN  Take 2 tablets (1,000 mg total) by mouth every 8 (eight) hours as needed for muscle spasms.     oxyCODONE-acetaminophen 5-325 MG per tablet  Commonly known as:  PERCOCET/ROXICET  Take 1-2 tablets by mouth every 6 (six) hours as needed for moderate pain.     phenazopyridine 100 MG tablet   Commonly known as:  PYRIDIUM  Take 1 tablet (100 mg total) by mouth 3 (three) times daily with meals.        Discharge Instructions:     Follow-up Information   Schedule an appointment as soon as possible for a visit with [facility]BLACKMAN,DOUGLAS A, MD. (For post-operation check.  The office should call you with an appointment time/date.  )    Specialty:  General Surgery   Contact information:   [address] [place] [address] [phone]       Signed: [name],Beth [name] 08/26/2014, 4:07 PM

## 2014-08-26 NOTE — Discharge Instructions (Signed)
[facility]      [facility] [phone]  OPEN ABDOMINAL SURGERY: POST OP INSTRUCTIONS  Always review your discharge instruction sheet given to you by the facility where your surgery was performed.  IF YOU HAVE DISABILITY OR FAMILY LEAVE FORMS, YOU MUST BRING THEM TO THE OFFICE FOR PROCESSING.  PLEASE DO NOT GIVE THEM TO YOUR DOCTOR.  1. A prescription for pain medication may be given to you upon discharge.  Take your pain medication as prescribed, if needed.  If narcotic pain medicine is not needed, then you may take acetaminophen (Tylenol) or ibuprofen (Advil) as needed. 2. Take your usually prescribed medications unless otherwise directed. 3. If you need a refill on your pain medication, please contact your pharmacy. They will contact our office to request authorization.  Prescriptions will not be filled after 5pm or on week-ends. 4. You should follow a light diet the first few days after arrival home, such as soup and crackers, pudding, etc.unless your doctor has advised otherwise. A high-fiber, low fat diet can be resumed as tolerated.   Be sure to include lots of fluids daily. Most patients will experience some swelling and bruising on the chest and neck area.  Ice packs will help.  Swelling and bruising can take several days to resolve 5. Most patients will experience some swelling and bruising in the area of the incision. Ice pack will help. Swelling and bruising can take several days to resolve..  6. It is common to experience some constipation if taking pain medication after surgery.  Increasing fluid intake and taking a stool softener will usually help or prevent this problem from occurring.  A mild laxative (Milk of Magnesia or Miralax) should be taken according to package directions if there are no bowel movements after 48 hours. 7.  You may have steri-strips (small skin tapes) in place directly over the incision.  These strips should be left on the skin for 7-10 days.  If your  surgeon used skin glue on the incision, you may shower in 24 hours.  The glue will flake off over the next 2-3 weeks.  Any sutures or staples will be removed at the office during your follow-up visit. You may find that a light gauze bandage over your incision may keep your staples from being rubbed or pulled. You may shower and replace the bandage daily. 8. ACTIVITIES:  You may resume regular (light) daily activities beginning the next day--such as daily self-care, walking, climbing stairs--gradually increasing activities as tolerated.  You may have sexual intercourse when it is comfortable.  Refrain from any heavy lifting or straining until approved by your doctor. a. You may drive when you no longer are taking prescription pain medication, you can comfortably wear a seatbelt, and you can safely maneuver your car and apply brakes b. Return to Work: ___________________________________ 9. You should see your doctor in the office for a follow-up appointment approximately two weeks after your surgery.  Make sure that you call for this appointment within a day or two after you arrive home to insure a convenient appointment time. OTHER INSTRUCTIONS:  _____________________________________________________________ _____________________________________________________________  WHEN TO CALL YOUR DOCTOR: 1. Fever over 101.0 2. Inability to urinate 3. Nausea and/or vomiting 4. Extreme swelling or bruising 5. Continued bleeding from incision. 6. Increased pain, redness, or drainage from the incision. 7. Difficulty swallowing or breathing 8. Muscle cramping or spasms. 9. Numbness or tingling in hands or feet or around lips.  The clinic staff is available to   answer your questions during regular business hours.  Please don't hesitate to call and ask to speak to one of the nurses if you have concerns.  For further questions, please visit [email or web address]   

## 2014-08-26 NOTE — Progress Notes (Signed)
Patient feels fine.  I agree about treatment empirically for UTI.  Can go home today.  Has VAC equipment.  They have a friend who can do the VAC changes.  Will get h er number and document on the chart.  Beth [name]. Gae Bon, MD, FACS [phone] [facility]

## 2014-08-27 NOTE — Discharge Summary (Signed)
Will give the patient a five day course of antibiotic for probably UTI.  Marta LamasJames O. Gae BonWyatt, III, MD, FACS [phone]--pager [phone]--[facility]

## 2014-08-29 ENCOUNTER — Telehealth (INDEPENDENT_AMBULATORY_CARE_PROVIDER_SITE_OTHER): Payer: Self-pay | Admitting: General Surgery

## 2014-08-29 NOTE — Telephone Encounter (Signed)
Pt complains of L flank pain, denies fevers.  Was discharged last week with a 5 day course of cipro for UTI.  Describes severe pain.  Recommended she see someone today for evaluation.

## 2014-09-03 ENCOUNTER — Telehealth (INDEPENDENT_AMBULATORY_CARE_PROVIDER_SITE_OTHER): Payer: Self-pay

## 2014-09-03 NOTE — Telephone Encounter (Signed)
Pt's surgery was 07/2114 by Dr. Barrie Dunker. She is calling today to request more pain medication.  She was prescribed Vicodin #30 on 08/30/14, and Oxycodone 5/325 #30 on 08/31/14.  In previous conversation the patient stated she could not take Percocet so was given a Rx for Vicodin.  She then called our office stating the Vicodin made her itch.  We recommended she take Benadryl with it, which she had tried and it did not relieve the itching.  Pt states today her pain is worse at night and she cannot sleep.  Dr. Magnus Ivan denied her refill request and recommended Tylenol for her pain.  Pt was unhappy with this and stated she will go to the ED, and she will request another physician if necessary.

## 2015-01-12 ENCOUNTER — Emergency Department (HOSPITAL_COMMUNITY): Payer: BLUE CROSS/BLUE SHIELD

## 2015-01-12 ENCOUNTER — Encounter (HOSPITAL_COMMUNITY): Payer: Self-pay | Admitting: *Deleted

## 2015-01-12 ENCOUNTER — Emergency Department (HOSPITAL_COMMUNITY)
Admission: EM | Admit: 2015-01-12 | Discharge: 2015-01-13 | Disposition: A | Discharge status: Other Acute Inpatient Hospital | Payer: BLUE CROSS/BLUE SHIELD | Attending: Emergency Medicine | Admitting: Emergency Medicine

## 2015-01-12 DIAGNOSIS — W1839XA Other fall on same level, initial encounter: Secondary | ICD-10-CM | POA: Insufficient documentation

## 2015-01-12 DIAGNOSIS — Y9389 Activity, other specified: Secondary | ICD-10-CM | POA: Diagnosis not present

## 2015-01-12 DIAGNOSIS — F1010 Alcohol abuse, uncomplicated: Secondary | ICD-10-CM | POA: Diagnosis not present

## 2015-01-12 DIAGNOSIS — Z792 Long term (current) use of antibiotics: Secondary | ICD-10-CM | POA: Diagnosis not present

## 2015-01-12 DIAGNOSIS — G8929 Other chronic pain: Secondary | ICD-10-CM | POA: Diagnosis not present

## 2015-01-12 DIAGNOSIS — Z3202 Encounter for pregnancy test, result negative: Secondary | ICD-10-CM | POA: Insufficient documentation

## 2015-01-12 DIAGNOSIS — Z720 Tobacco use: Secondary | ICD-10-CM | POA: Insufficient documentation

## 2015-01-12 DIAGNOSIS — Z79899 Other long term (current) drug therapy: Secondary | ICD-10-CM | POA: Insufficient documentation

## 2015-01-12 DIAGNOSIS — S0083XA Contusion of other part of head, initial encounter: Secondary | ICD-10-CM | POA: Diagnosis not present

## 2015-01-12 DIAGNOSIS — F329 Major depressive disorder, single episode, unspecified: Secondary | ICD-10-CM | POA: Diagnosis not present

## 2015-01-12 DIAGNOSIS — S0093XA Contusion of unspecified part of head, initial encounter: Secondary | ICD-10-CM

## 2015-01-12 DIAGNOSIS — F32A Depression, unspecified: Secondary | ICD-10-CM

## 2015-01-12 DIAGNOSIS — Z8679 Personal history of other diseases of the circulatory system: Secondary | ICD-10-CM | POA: Insufficient documentation

## 2015-01-12 DIAGNOSIS — Y9289 Other specified places as the place of occurrence of the external cause: Secondary | ICD-10-CM | POA: Insufficient documentation

## 2015-01-12 DIAGNOSIS — Y998 Other external cause status: Secondary | ICD-10-CM | POA: Insufficient documentation

## 2015-01-12 DIAGNOSIS — J45909 Unspecified asthma, uncomplicated: Secondary | ICD-10-CM | POA: Diagnosis not present

## 2015-01-12 DIAGNOSIS — S60512A Abrasion of left hand, initial encounter: Secondary | ICD-10-CM

## 2015-01-12 DIAGNOSIS — Z8719 Personal history of other diseases of the digestive system: Secondary | ICD-10-CM | POA: Diagnosis not present

## 2015-01-12 DIAGNOSIS — Z008 Encounter for other general examination: Secondary | ICD-10-CM | POA: Diagnosis present

## 2015-01-12 LAB — CBC
HCT: 33.9 % — ABNORMAL LOW (ref 36.0–46.0)
Hemoglobin: 10.2 g/dL — ABNORMAL LOW (ref 12.0–15.0)
MCH: 22 pg — AB (ref 26.0–34.0)
MCHC: 30.1 g/dL (ref 30.0–36.0)
MCV: 73.2 fL — ABNORMAL LOW (ref 78.0–100.0)
PLATELETS: 769 10*3/uL — AB (ref 150–400)
RBC: 4.63 MIL/uL (ref 3.87–5.11)
RDW: 18.5 % — ABNORMAL HIGH (ref 11.5–15.5)
WBC: 8.2 10*3/uL (ref 4.0–10.5)

## 2015-01-12 LAB — COMPREHENSIVE METABOLIC PANEL
ALK PHOS: 68 U/L (ref 39–117)
ALT: 21 U/L (ref 0–35)
AST: 24 U/L (ref 0–37)
Albumin: 3.5 g/dL (ref 3.5–5.2)
Anion gap: 13 (ref 5–15)
BILIRUBIN TOTAL: 0.5 mg/dL (ref 0.3–1.2)
BUN: 5 mg/dL — ABNORMAL LOW (ref 6–23)
CHLORIDE: 108 mmol/L (ref 96–112)
CO2: 21 mmol/L (ref 19–32)
Calcium: 9 mg/dL (ref 8.4–10.5)
Creatinine, Ser: 0.74 mg/dL (ref 0.50–1.10)
GLUCOSE: 91 mg/dL (ref 70–99)
Potassium: 3.8 mmol/L (ref 3.5–5.1)
SODIUM: 142 mmol/L (ref 135–145)
Total Protein: 7.3 g/dL (ref 6.0–8.3)

## 2015-01-12 LAB — SALICYLATE LEVEL: Salicylate Lvl: <4 mg/dL (ref 2.8–20.0)

## 2015-01-12 LAB — ACETAMINOPHEN LEVEL: Acetaminophen (Tylenol), Serum: <10 ug/mL — ABNORMAL LOW (ref 10–30)

## 2015-01-12 LAB — ETHANOL: Alcohol, Ethyl (B): 268 mg/dL — ABNORMAL HIGH (ref 0–9)

## 2015-01-12 MED ORDER — IBUPROFEN 400 MG PO TABS
800.0000 mg | ORAL_TABLET | Freq: Once | ORAL | Status: DC
  Filled 2015-01-12: qty 1

## 2015-01-12 NOTE — ED Provider Notes (Signed)
CSN: [id number]     Arrival date & time 01/12/15  2041 History   First MD Initiated Contact with Patient 01/12/15 2211     Chief Complaint  Patient presents with  . Alcohol Problem     (Consider location/radiation/quality/duration/timing/severity/associated sxs/prior Treatment) Patient is a 34 y.o. female presenting with alcohol problem. The history is provided by the patient. No language interpreter was used.  Alcohol Problem This is a recurrent problem. The current episode started yesterday. The problem occurs constantly. The problem has been gradually worsening. Pertinent negatives include no vomiting. Nothing aggravates the symptoms. She has tried nothing for the symptoms. The treatment provided moderate relief.  Pt reports she has a history of alcohol abuse.  Pt has been drinking for the past 2 days.  Pt reports increased depression recently.   Pt has been having thoughts of suicide. No plan.  Pt fell today and hit head.  Positive loc.  Pt landed on hand and hand is sore.  Past Medical History  Diagnosis Date  . Acid reflux   . Asthma   . History of blood transfusion   . Multiple sclerosis 2005    diagnosed in [place], failed steroids, no other medication  . Anxiety   . Gastric ulcer   . Migraine   . Chronic nausea     self reported  . Chronic diarrhea     self reported  . Chronic pain     self reported chronic pain, back pain, fibromyalgia  . Insomnia   . Polysubstance abuse   . ETOH abuse   . Delirium tremens    Past Surgical History  Procedure Laterality Date  . Gastric bypass    . Diagnostic laparoscopy  2003    repair of perforation at GJ anastamosis   . Lumbar laminectomy/decompression microdiscectomy Right 04/14/2014    Procedure: LUMBAR LAMINECTOMY/DECOMPRESSION MICRODISCECTOMY LEVEL L4-5;  Surgeon: Maeola Harman, MD;  Location: [facility] OR[facility];  Service: Neurosurgery;  Laterality: Right;  LUMBAR LAMINECTOMY/DECOMPRESSION MICRODISCECTOMY LEVEL L4-5  . Laparotomy N/A  08/18/2014    Procedure: EXPLORATORY LAPAROTOMY, Patch graft of gastric perforation;  Surgeon: Abigail Miyamoto, MD;  Location: [facility] OR;  Service: General;  Laterality: N/A;   Family History  Problem Relation Age of Onset  . Hypertension Paternal Grandfather   . Colon cancer Paternal Grandfather 16  . Asthma Paternal Grandmother   . Hypertension Paternal Grandmother   . Breast cancer Paternal Grandmother   . Cancer Paternal Grandmother     lung   . Diabetes Maternal Grandmother   . Diabetes Maternal Grandfather   . Hypertension Father   . Urinary tract infection Mother   . Stroke Mother 37    3 strokes    History  Substance Use Topics  . Smoking status: Current Some Day Smoker -- 0.10 packs/day    Types: Cigarettes  . Smokeless tobacco: Never Used  . Alcohol Use: 195.6 oz/week    25 Cans of beer, 301 Shots of liquor per week     Comment: Says she can drink up to a 1/2 gallon/day   OB History    Gravida Para Term Preterm AB TAB SAB Ectopic Multiple Living   0         0     Review of Systems  Gastrointestinal: Negative for vomiting.  All other systems reviewed and are negative.     Allergies  Cymbalta; Dilaudid; and Prednisone  Home Medications   Prior to Admission medications   Medication Sig Start Date End  Date Taking? Authorizing Provider  ciprofloxacin (CIPRO) 500 MG tablet Take 1 tablet (500 mg total) by mouth 2 (two) times daily. 08/26/14   Megan N Dort, PA-C  lidocaine (LIDODERM) 5 % Place 1 patch onto the skin daily. Remove & Discard patch within 12 hours or as directed by MD 08/26/14   Rueben Bash Dort, PA-C  methocarbamol (ROBAXIN) 500 MG tablet Take 2 tablets (1,000 mg total) by mouth every 8 (eight) hours as needed for muscle spasms. 08/26/14   Megan N Dort, PA-C  oxyCODONE-acetaminophen (PERCOCET/ROXICET) 5-325 MG per tablet Take 1-2 tablets by mouth every 6 (six) hours as needed for moderate pain. 08/26/14   Megan N Dort, PA-C  phenazopyridine (PYRIDIUM) 100  MG tablet Take 1 tablet (100 mg total) by mouth 3 (three) times daily with meals. 08/26/14   Megan N Dort, PA-C   BP 130/80 mmHg  Pulse 100  Resp 17  SpO2 98%  LMP 12/14/2014 Physical Exam  Constitutional: She is oriented to person, place, and time. She appears well-developed and well-nourished.  HENT:  Head: Normocephalic.  Right Ear: External ear normal.  Nose: Nose normal.  Mouth/Throat: Oropharynx is clear and moist.  Eyes: Pupils are equal, round, and reactive to light.  Neck: Normal range of motion.  Cardiovascular: Normal rate.   Pulmonary/Chest: Effort normal.  Abdominal: Soft.  Musculoskeletal: She exhibits tenderness.  Abrasion forehead and abrasions left hand, abrasion forearm  Neurological: She is alert and oriented to person, place, and time. She has normal reflexes.  Psychiatric: She has a normal mood and affect.    ED Course  Procedures (including critical care time) Labs Review Labs Reviewed  ACETAMINOPHEN LEVEL - Abnormal; Notable for the following:    Acetaminophen (Tylenol), Serum <10.0 (*)    All other components within normal limits  CBC - Abnormal; Notable for the following:    Hemoglobin 10.2 (*)    HCT 33.9 (*)    MCV 73.2 (*)    MCH 22.0 (*)    RDW 18.5 (*)    Platelets 769 (*)    All other components within normal limits  COMPREHENSIVE METABOLIC PANEL - Abnormal; Notable for the following:    BUN 5 (*)    All other components within normal limits  ETHANOL - Abnormal; Notable for the following:    Alcohol, Ethyl (B) 268 (*)    All other components within normal limits  SALICYLATE LEVEL  URINE RAPID DRUG SCREEN (HOSP PERFORMED)  POC URINE PREG, ED    Imaging Review Dg Hand Complete Left  01/12/2015   CLINICAL DATA:  34 year old female with a history of fall and pain.  EXAM: LEFT HAND - COMPLETE 3+ VIEW  COMPARISON:  None.  FINDINGS: There is no evidence of fracture or dislocation. There is no evidence of arthropathy or other focal bone  abnormality. Soft tissues are unremarkable.  IMPRESSION: Negative for acute bony abnormality.  Signed,  Yvone Neu. Loreta Ave, DO  [facility]  [facility]   Electronically Signed   By: Gilmer Mor D.O.   On: 01/12/2015 23:10     EKG Interpretation None      MDM  Pt accepted to Dr. Dub Mikes at [facility].   Final diagnoses:  Abrasion of left hand, initial encounter  Contusion of head, initial encounter  Alcohol abuse  Depression       Elson Areas, PA-C 01/12/15 2341  Lonia Skinner [place], PA-C 01/13/15 0126  Mirian Mo, MD 01/18/15 0010

## 2015-01-12 NOTE — BH Assessment (Signed)
Requested cart be placed with pt for assessment.   Spoke with Langston Masker, PA-C prior to assessment. Per Clydie Braun, [place] pt has hx of depression, and alcohol abuse and has been having SI recently. Pt has had struggles including gastric bypass that ruptured and had to be repaired.   Assessment to commence shortly.    Clista Bernhardt, [facility] Triage Specialist 01/12/2015 11:59 PM

## 2015-01-12 NOTE — ED Notes (Addendum)
Patient placed on the monitor, BP and pulse ox.  Resting in bed with parents at the bedside.  Patient states she is starting to become anxious  PA notified

## 2015-01-12 NOTE — ED Notes (Signed)
Patients states that she has thoughts of hurting herself. No plan. Patient just came to the area 2 days ago after living with her father.
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
## 2015-01-13 ENCOUNTER — Inpatient Hospital Stay (HOSPITAL_COMMUNITY)
Admission: EM | Admit: 2015-01-13 | Discharge: 2015-01-20 | DRG: 897 | Disposition: A | Discharge status: Other Acute Inpatient Hospital | Payer: BLUE CROSS/BLUE SHIELD | Source: Intra-hospital | Attending: Psychiatry | Admitting: Psychiatry

## 2015-01-13 ENCOUNTER — Encounter (HOSPITAL_COMMUNITY): Payer: Self-pay | Admitting: *Deleted

## 2015-01-13 DIAGNOSIS — F10229 Alcohol dependence with intoxication, unspecified: Secondary | ICD-10-CM | POA: Diagnosis present

## 2015-01-13 DIAGNOSIS — F10280 Alcohol dependence with alcohol-induced anxiety disorder: Principal | ICD-10-CM | POA: Diagnosis present

## 2015-01-13 DIAGNOSIS — F1094 Alcohol use, unspecified with alcohol-induced mood disorder: Secondary | ICD-10-CM | POA: Diagnosis present

## 2015-01-13 DIAGNOSIS — Y908 Blood alcohol level of 240 mg/100 ml or more: Secondary | ICD-10-CM | POA: Diagnosis present

## 2015-01-13 DIAGNOSIS — F17210 Nicotine dependence, cigarettes, uncomplicated: Secondary | ICD-10-CM | POA: Diagnosis present

## 2015-01-13 DIAGNOSIS — F10980 Alcohol use, unspecified with alcohol-induced anxiety disorder: Secondary | ICD-10-CM | POA: Diagnosis not present

## 2015-01-13 DIAGNOSIS — F1010 Alcohol abuse, uncomplicated: Secondary | ICD-10-CM | POA: Diagnosis not present

## 2015-01-13 DIAGNOSIS — Z9884 Bariatric surgery status: Secondary | ICD-10-CM

## 2015-01-13 DIAGNOSIS — F1020 Alcohol dependence, uncomplicated: Secondary | ICD-10-CM | POA: Diagnosis present

## 2015-01-13 DIAGNOSIS — F329 Major depressive disorder, single episode, unspecified: Secondary | ICD-10-CM | POA: Diagnosis present

## 2015-01-13 LAB — RAPID URINE DRUG SCREEN, HOSP PERFORMED
Amphetamines: NOT DETECTED
Barbiturates: NOT DETECTED
Benzodiazepines: NOT DETECTED
Cocaine: NOT DETECTED
Opiates: NOT DETECTED
Tetrahydrocannabinol: NOT DETECTED

## 2015-01-13 LAB — TSH: TSH: 1.649 u[IU]/mL (ref 0.350–4.500)

## 2015-01-13 LAB — POC URINE PREG, ED: Preg Test, Ur: NEGATIVE

## 2015-01-13 MED ORDER — LOPERAMIDE HCL 2 MG PO CAPS: 2.0000 mg | ORAL_CAPSULE | ORAL | Status: AC | PRN

## 2015-01-13 MED ORDER — METAXALONE 400 MG HALF TABLET
400.0000 mg | ORAL_TABLET | Freq: Three times a day (TID) | ORAL | Status: DC
  Administered 2015-01-13 – 2015-01-15 (×8): 400 mg via ORAL
  Filled 2015-01-13 (×12): qty 1

## 2015-01-13 MED ORDER — CHLORDIAZEPOXIDE HCL 25 MG PO CAPS
25.0000 mg | ORAL_CAPSULE | Freq: Every day | ORAL | Status: AC
  Administered 2015-01-16: 25 mg via ORAL
  Filled 2015-01-13: qty 1

## 2015-01-13 MED ORDER — TRAZODONE HCL 50 MG PO TABS
50.0000 mg | ORAL_TABLET | Freq: Every evening | ORAL | Status: DC | PRN
  Filled 2015-01-13 (×4): qty 1

## 2015-01-13 MED ORDER — BACITRACIN-NEOMYCIN-POLYMYXIN OINTMENT TUBE
TOPICAL_OINTMENT | Freq: Two times a day (BID) | CUTANEOUS | Status: DC
  Administered 2015-01-13 – 2015-01-17 (×8): via TOPICAL
  Filled 2015-01-13: qty 1
  Filled 2015-01-13: qty 15

## 2015-01-13 MED ORDER — IBUPROFEN 600 MG PO TABS: 600.0000 mg | ORAL_TABLET | Freq: Four times a day (QID) | ORAL | Status: DC | PRN

## 2015-01-13 MED ORDER — ALUM & MAG HYDROXIDE-SIMETH 200-200-20 MG/5ML PO SUSP
30.0000 mL | ORAL | Status: DC | PRN
  Administered 2015-01-15 – 2015-01-17 (×3): 30 mL via ORAL
  Filled 2015-01-13 (×3): qty 30

## 2015-01-13 MED ORDER — TRAZODONE HCL 150 MG PO TABS
150.0000 mg | ORAL_TABLET | Freq: Every day | ORAL | Status: DC
  Administered 2015-01-13: 150 mg via ORAL
  Filled 2015-01-13 (×3): qty 1

## 2015-01-13 MED ORDER — THIAMINE HCL 100 MG/ML IJ SOLN: 100.0000 mg | Freq: Once | INTRAMUSCULAR | Status: DC

## 2015-01-13 MED ORDER — ACETAMINOPHEN 325 MG PO TABS
650.0000 mg | ORAL_TABLET | Freq: Four times a day (QID) | ORAL | Status: DC | PRN
  Administered 2015-01-13 – 2015-01-16 (×6): 650 mg via ORAL
  Filled 2015-01-13 (×7): qty 2

## 2015-01-13 MED ORDER — CHLORDIAZEPOXIDE HCL 25 MG PO CAPS
25.0000 mg | ORAL_CAPSULE | ORAL | Status: AC
  Administered 2015-01-15 (×2): 25 mg via ORAL
  Filled 2015-01-13 (×2): qty 1

## 2015-01-13 MED ORDER — VITAMIN B-1 100 MG PO TABS
100.0000 mg | ORAL_TABLET | Freq: Every day | ORAL | Status: DC
  Administered 2015-01-14 – 2015-01-20 (×7): 100 mg via ORAL
  Filled 2015-01-13 (×10): qty 1

## 2015-01-13 MED ORDER — ACETAMINOPHEN 500 MG PO TABS
1000.0000 mg | ORAL_TABLET | ORAL | Status: AC
  Administered 2015-01-13: 1000 mg via ORAL
  Filled 2015-01-13 (×2): qty 2

## 2015-01-13 MED ORDER — HYDROXYZINE HCL 25 MG PO TABS
25.0000 mg | ORAL_TABLET | Freq: Four times a day (QID) | ORAL | Status: DC | PRN
  Administered 2015-01-13 – 2015-01-14 (×4): 25 mg via ORAL
  Filled 2015-01-13 (×4): qty 1

## 2015-01-13 MED ORDER — MAGNESIUM HYDROXIDE 400 MG/5ML PO SUSP: 30.0000 mL | Freq: Every day | ORAL | Status: DC | PRN

## 2015-01-13 MED ORDER — CHLORDIAZEPOXIDE HCL 25 MG PO CAPS
25.0000 mg | ORAL_CAPSULE | Freq: Four times a day (QID) | ORAL | Status: AC
  Administered 2015-01-13 (×4): 25 mg via ORAL
  Filled 2015-01-13 (×4): qty 1

## 2015-01-13 MED ORDER — CHLORDIAZEPOXIDE HCL 25 MG PO CAPS
25.0000 mg | ORAL_CAPSULE | Freq: Three times a day (TID) | ORAL | Status: AC
  Administered 2015-01-14 (×3): 25 mg via ORAL
  Filled 2015-01-13 (×3): qty 1

## 2015-01-13 MED ORDER — ADULT MULTIVITAMIN W/MINERALS CH
1.0000 | ORAL_TABLET | Freq: Every day | ORAL | Status: DC
  Administered 2015-01-13 – 2015-01-20 (×8): 1 via ORAL
  Filled 2015-01-13 (×12): qty 1

## 2015-01-13 MED ORDER — ACETAMINOPHEN 325 MG PO TABS
650.0000 mg | ORAL_TABLET | Freq: Once | ORAL | Status: AC
  Administered 2015-01-13: 650 mg via ORAL
  Filled 2015-01-13: qty 2

## 2015-01-13 MED ORDER — CHLORDIAZEPOXIDE HCL 25 MG PO CAPS
25.0000 mg | ORAL_CAPSULE | Freq: Four times a day (QID) | ORAL | Status: AC | PRN
  Administered 2015-01-13: 25 mg via ORAL
  Filled 2015-01-13: qty 1

## 2015-01-13 MED ORDER — ONDANSETRON 4 MG PO TBDP
4.0000 mg | ORAL_TABLET | Freq: Four times a day (QID) | ORAL | Status: AC | PRN
  Administered 2015-01-13 – 2015-01-15 (×3): 4 mg via ORAL
  Filled 2015-01-13 (×3): qty 1
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
## 2015-01-13 NOTE — Progress Notes (Signed)
D: Pt denies SI/HI/AVH. Pt is pleasant and cooperative. Pt only concern was somatic Sx. Pt stated she was ok , wanted to leave.   A: Pt was offered support and encouragement. Pt was given scheduled medications. Pt was encourage to attend groups. Q 15 minute checks were done for safety.   R:Pt attends groups and interacts well with peers and staff. Pt is taking medication. Pt has no complaints at this time .Pt receptive to treatment and safety maintained on unit.

## 2015-01-13 NOTE — H&P (Signed)
Psychiatric Admission Assessment Adult  Patient Identification: Beth [name] MRN:  [id number] Date of Evaluation:  01/13/2015 Chief Complaint:  Alcohol Use Disorder MDD Principal Diagnosis: Alcohol-induced mood disorder Diagnosis:   Patient Active Problem List   Diagnosis Date Noted  . Free intraperitoneal air [K66.8] 08/18/2014  . Perforated ulcer [K27.5] 08/18/2014  . 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 Acid reflux [K21.9]   . History of blood transfusion [Z92.89]   . Multiple sclerosis [G35] 03/13/2012  . H/O gastric bypass [Z98.84] 03/13/2012   History of Present Illness: Beth [name] is an 34 y.o. female presenting to ED after drinking heavily today after two months of being sober. Pt reports she has been having SI with some planning but denies intent.  She expressed that she was tired of drinking.  She was previously living in [place] ([place]) but moved to [place] to stay with her father and get away from influences of ETOH.  She states not getting along with her step mother so she moved back to [place].  She met up with an ex-BF and began to drink.  She can not recall why she bought and drank gallon of vodka yesterday.  She reports SI with planning, denies AVH, HI, and current SA. She reports she was drinking 1/2 gallon to 2 gallons of vodka per day after her sister died.  She reports she thinks about killing herself frequently and it has become more often, and longer lasting.  However, it is intermittent and could not specify specific emotional triggers when they come on.     Pt reports she can not act on suicidal thoughts due to her sister committing suicide three years ago.  She has a history of gastric bypass surgery but has not had satisfactory follow up care since being uninsured.  Burnell is single and she has no children.  She worked at the [employer] in [place] and awaiting plaement at [employer] here.   She had previous OBS unit in 04/2014 also for ETOH.  She also reports attending [place] meeting that were helpful to her in [place].  Elements:  Location:  ETOH abuse. Quality:  Feel hopeless, anxious. Severity:  Severe. Timing:  Yesterday. Duration:  chronic, intermittent. Context:  See HPI.   Associated Signs/Symptoms: Depression Symptoms:  depressed mood, feelings of worthlessness/guilt, hopelessness, anxiety, (Hypo) Manic Symptoms:  Labiality of Mood, Anxiety Symptoms:  Excessive Worry, Psychotic Symptoms:  NA PTSD Symptoms: Had a traumatic exposure:  Sister committed suicide Total Time spent with patient: 30 minutes  Past Medical History:  Past Medical History  Diagnosis Date  . Acid reflux   . Asthma   . History of blood transfusion   . Multiple sclerosis 2005    diagnosed in [place], failed steroids, no other medication  . Anxiety   . Gastric ulcer   . Migraine   . Chronic nausea     self reported  . Chronic diarrhea     self reported  . Chronic pain     self reported chronic pain, back pain, fibromyalgia  . Insomnia   . Polysubstance abuse   . ETOH abuse   . Delirium tremens     Past Surgical History  Procedure Laterality Date  . Gastric bypass    . Diagnostic laparoscopy  2003    repair of perforation at [facility] anastamosis   . Lumbar laminectomy/decompression microdiscectomy Right 04/14/2014    Procedure: LUMBAR LAMINECTOMY/DECOMPRESSION MICRODISCECTOMY LEVEL  L4-5;  Surgeon: Erline Levine, MD;  Location: [facility];  Service: Neurosurgery;  Laterality: Right;  LUMBAR LAMINECTOMY/DECOMPRESSION MICRODISCECTOMY LEVEL L4-5  . Laparotomy N/A 08/18/2014    Procedure: EXPLORATORY LAPAROTOMY, Patch graft of gastric perforation;  Surgeon: Coralie Keens, MD;  Location: [facility];  Service: General;  Laterality: N/A;   Family History:  Family History  Problem Relation Age of Onset  . Hypertension Paternal Grandfather   . Colon cancer Paternal Grandfather 45  . Asthma Paternal  Grandmother   . Hypertension Paternal Grandmother   . Breast cancer Paternal Grandmother   . Cancer Paternal Grandmother     lung   . Diabetes Maternal Grandmother   . Diabetes Maternal Grandfather   . Hypertension Father   . Urinary tract infection Mother   . Stroke Mother 80    3 strokes    Social History:  History  Alcohol Use  . 195.6 oz/week  . 25 Cans of beer, 301 Shots of liquor per week    Comment: Says she can drink up to a 1/2 gallon/day     History  Drug Use No    Comment: cocaine-none >1 year.  meth-none >10 years.    History   Social History  . Marital Status: Married    Spouse Name: N/A  . Number of Children: N/A  . Years of Education: N/A   Social History Main Topics  . Smoking status: Current Some Day Smoker -- 0.10 packs/day    Types: Cigarettes  . Smokeless tobacco: Never Used  . Alcohol Use: 195.6 oz/week    25 Cans of beer, 301 Shots of liquor per week     Comment: Says she can drink up to a 1/2 gallon/day  . Drug Use: No     Comment: cocaine-none >1 year.  meth-none >10 years.  . Sexual Activity: Yes    Birth Control/ Protection: None   Other Topics Concern  . None   Social History Narrative   Additional Social History:    Pain Medications: SEE PTA reports was abusing prescribed opiates after surgery but has not taken any in at least five months Prescriptions: SEE PTA, reports she has been off medication due to not having insurance, reports recently regained insurance and does not have providers Over the Counter: denies History of alcohol / drug use?: Yes Negative Consequences of Use: Financial, Scientist, research (physical sciences), Personal relationships, Work / Youth worker Withdrawal Symptoms: Tremors Name of Substance 1: etoh 1 - Age of First Use: 61, then regular heavy use starting about three years ago when her sister committed suicide 1 - Amount (size/oz): 1/2 gallon to 2 gallons of vodka 1 - Frequency: daily  1 - Duration: 3 years 1 - Last Use / Amount: today,  amount uncertain BAL 329  Musculoskeletal: Strength & Muscle Tone: within normal limits Gait & Station: normal Patient leans: N/A  Psychiatric Specialty Exam: Physical Exam  Vitals reviewed. Musculoskeletal:  Patient fell as she was intoxicated.  She is having musculoskeletal pain  Psychiatric: Her behavior is normal. Her mood appears anxious.    Review of Systems  Psychiatric/Behavioral: The patient is nervous/anxious.   All other systems reviewed and are negative.   Blood pressure 127/91, pulse 100, temperature 98 F (36.7 C), temperature source Oral, resp. rate 18, height 5' 6" (1.676 m), weight 104.101 kg (229 lb 8 oz), last menstrual period 12/14/2014.Body mass index is 37.06 kg/(m^2).  General Appearance: Disheveled  Eye Contact::  Fair  Speech:  Clear and Coherent  Volume:  Normal  Mood:  Anxious and Depressed  Affect:  Depressed and Flat  Thought Process:  Intact  Orientation:  Full (Time, Place, and Person)  Thought Content:  Rumination  Suicidal Thoughts:  No  Homicidal Thoughts:  No  Memory:  Immediate;   Fair Recent;   Fair Remote;   Fair  Judgement:  Fair  Insight:  Fair  Psychomotor Activity:  Normal  Concentration:  Fair  Recall:  [employer] of Knowledge:Fair  Language: Good  Akathisia:  Negative  Handed:  Right  AIMS (if indicated):     Assets:  Resilience Social Support Talents/Skills  ADL's:  Intact  Cognition: WNL  Sleep:  Number of Hours: 1.75   Risk to Self: Is patient at risk for suicide?: Yes Risk to Others:   Prior Inpatient Therapy:   Prior Outpatient Therapy:    Alcohol Screening: 1. How often do you have a drink containing alcohol?: 4 or more times a week (sober x 32month relapsed 2 days ago. history from last 2days and prior) 2. How many drinks containing alcohol do you have on a typical day when you are drinking?: 10 or more 3. How often do you have six or more drinks on one occasion?: Monthly Preliminary Score: 6 4. How often  during the last year have you found that you were not able to stop drinking once you had started?: Weekly 5. How often during the last year have you failed to do what was normally expected from you becasue of drinking?: Monthly 6. How often during the last year have you needed a first drink in the morning to get yourself going after a heavy drinking session?: Monthly 7. How often during the last year have you had a feeling of guilt of remorse after drinking?: Daily or almost daily 8. How often during the last year have you been unable to remember what happened the night before because you had been drinking?: Less than monthly 9. Have you or someone else been injured as a result of your drinking?: No 10. Has a relative or friend or a doctor or another health worker been concerned about your drinking or suggested you cut down?: Yes, during the last year Alcohol Use Disorder Identification Test Final Score (AUDIT): 26 Brief Intervention: MD notified of score 20 or above  Allergies:   Allergies  Allergen Reactions  . Cymbalta [Duloxetine Hcl]     Made body jerk and twitch, spasms  . Dilaudid [Hydromorphone Hcl] Other (See Comments)    Anger, aggression   . Prednisone     Steroids in general   Lab Results:  Results for orders placed or performed during the hospital encounter of 01/12/15 (from the past 48 hour(s))  Acetaminophen level     Status: Abnormal   Collection Time: 01/12/15  9:44 PM  Result Value Ref Range   Acetaminophen (Tylenol), Serum <10.0 (L) 10 - 30 ug/mL    Comment:        THERAPEUTIC CONCENTRATIONS VARY SIGNIFICANTLY. A RANGE OF 10-30 ug/mL MAY BE AN EFFECTIVE CONCENTRATION FOR MANY PATIENTS. HOWEVER, SOME ARE BEST TREATED AT CONCENTRATIONS OUTSIDE THIS RANGE. ACETAMINOPHEN CONCENTRATIONS >150 ug/mL AT 4 HOURS AFTER INGESTION AND >50 ug/mL AT 12 HOURS AFTER INGESTION ARE OFTEN ASSOCIATED WITH TOXIC REACTIONS.   CBC     Status: Abnormal   Collection Time: 01/12/15   9:44 PM  Result Value Ref Range   WBC 8.2 4.0 - 10.5 K/uL   RBC 4.63 3.87 - 5.11 MIL/uL  Hemoglobin 10.2 (L) 12.0 - 15.0 g/dL   HCT 33.9 (L) 36.0 - 46.0 %   MCV 73.2 (L) 78.0 - 100.0 fL   MCH 22.0 (L) 26.0 - 34.0 pg   MCHC 30.1 30.0 - 36.0 g/dL   RDW 18.5 (H) 11.5 - 15.5 %   Platelets 769 (H) 150 - 400 K/uL  Comprehensive metabolic panel     Status: Abnormal   Collection Time: 01/12/15  9:44 PM  Result Value Ref Range   Sodium 142 135 - 145 mmol/L   Potassium 3.8 3.5 - 5.1 mmol/L   Chloride 108 96 - 112 mmol/L   CO2 21 19 - 32 mmol/L   Glucose, Bld 91 70 - 99 mg/dL   BUN 5 (L) 6 - 23 mg/dL   Creatinine, Ser 0.74 0.50 - 1.10 mg/dL   Calcium 9.0 8.4 - 10.5 mg/dL   Total Protein 7.3 6.0 - 8.3 g/dL   Albumin 3.5 3.5 - 5.2 g/dL   AST 24 0 - 37 U/L   ALT 21 0 - 35 U/L   Alkaline Phosphatase 68 39 - 117 U/L   Total Bilirubin 0.5 0.3 - 1.2 mg/dL   GFR calc non Af Amer >90 >90 mL/min   GFR calc Af Amer >90 >90 mL/min    Comment: (NOTE) The eGFR has been calculated using the CKD EPI equation. This calculation has not been validated in all clinical situations. eGFR's persistently <90 mL/min signify possible Chronic Kidney Disease.    Anion gap 13 5 - 15  Ethanol (ETOH)     Status: Abnormal   Collection Time: 01/12/15  9:44 PM  Result Value Ref Range   Alcohol, Ethyl (B) 268 (H) 0 - 9 mg/dL    Comment:        LOWEST DETECTABLE LIMIT FOR SERUM ALCOHOL IS 11 mg/dL FOR MEDICAL PURPOSES ONLY   Salicylate level     Status: None   Collection Time: 01/12/15  9:44 PM  Result Value Ref Range   Salicylate Lvl <1.5 2.8 - 20.0 mg/dL  Urine Drug Screen     Status: None   Collection Time: 01/13/15 12:55 AM  Result Value Ref Range   Opiates NONE DETECTED NONE DETECTED   Cocaine NONE DETECTED NONE DETECTED   Benzodiazepines NONE DETECTED NONE DETECTED   Amphetamines NONE DETECTED NONE DETECTED   Tetrahydrocannabinol NONE DETECTED NONE DETECTED   Barbiturates NONE DETECTED NONE  DETECTED    Comment:        DRUG SCREEN FOR MEDICAL PURPOSES ONLY.  IF CONFIRMATION IS NEEDED FOR ANY PURPOSE, NOTIFY LAB WITHIN 5 DAYS.        LOWEST DETECTABLE LIMITS FOR URINE DRUG SCREEN Drug Class       Cutoff (ng/mL) Amphetamine      1000 Barbiturate      200 Benzodiazepine   176 Tricyclics       160 Opiates          300 Cocaine          300 THC              50   POC Urine Pregnancy, ED (if pre-menopausal female) - NOT at [facility]     Status: None   Collection Time: 01/13/15  1:27 AM  Result Value Ref Range   Preg Test, Ur NEGATIVE NEGATIVE    Comment:        THE SENSITIVITY OF THIS METHODOLOGY IS >24 mIU/mL    Current Medications: Current Facility-Administered Medications  Medication Dose Route Frequency Provider Last Rate Last Dose  . acetaminophen (TYLENOL) tablet 1,000 mg  1,000 mg Oral NOW Kerrie Buffalo, NP      . acetaminophen (TYLENOL) tablet 650 mg  650 mg Oral Q6H PRN Laverle Hobby, PA-C      . alum & mag hydroxide-simeth (MAALOX/MYLANTA) 200-200-20 MG/5ML suspension 30 mL  30 mL Oral Q4H PRN Laverle Hobby, PA-C      . chlordiazePOXIDE (LIBRIUM) capsule 25 mg  25 mg Oral Q6H PRN Laverle Hobby, PA-C   25 mg at 01/13/15 0313  . chlordiazePOXIDE (LIBRIUM) capsule 25 mg  25 mg Oral QID Laverle Hobby, PA-C   25 mg at 01/13/15 2353   Followed by  . [START ON 01/14/2015] chlordiazePOXIDE (LIBRIUM) capsule 25 mg  25 mg Oral TID Laverle Hobby, PA-C       Followed by  . [START ON 01/15/2015] chlordiazePOXIDE (LIBRIUM) capsule 25 mg  25 mg Oral BH-qamhs Spencer E Simon, PA-C       Followed by  . [START ON 01/16/2015] chlordiazePOXIDE (LIBRIUM) capsule 25 mg  25 mg Oral Daily Laverle Hobby, PA-C      . hydrOXYzine (ATARAX/VISTARIL) tablet 25 mg  25 mg Oral Q6H PRN Laverle Hobby, PA-C   25 mg at 01/13/15 0313  . loperamide (IMODIUM) capsule 2-4 mg  2-4 mg Oral PRN Laverle Hobby, PA-C      . magnesium hydroxide (MILK OF MAGNESIA) suspension 30 mL  30 mL Oral Daily  PRN Laverle Hobby, PA-C      . metaxalone [facility]) tablet 400 mg  400 mg Oral TID Kerrie Buffalo, NP      . multivitamin with minerals tablet 1 tablet  1 tablet Oral Daily Laverle Hobby, PA-C   1 tablet at 01/13/15 0806  . ondansetron (ZOFRAN-ODT) disintegrating tablet 4 mg  4 mg Oral Q6H PRN Laverle Hobby, PA-C   4 mg at 01/13/15 6144  . thiamine (B-1) injection 100 mg  100 mg Intramuscular Once Laverle Hobby, PA-C   100 mg at 01/13/15 0316  . [START ON 01/14/2015] thiamine (VITAMIN B-1) tablet 100 mg  100 mg Oral Daily Laverle Hobby, PA-C      . traZODone (DESYREL) tablet 50 mg  50 mg Oral QHS,MR X 1 Spencer E Simon, PA-C       PTA Medications: Prescriptions prior to admission  Medication Sig Dispense Refill Last Dose  . acetaminophen (TYLENOL) 650 MG CR tablet Take 2,600 mg by mouth once.   01/12/2015 at Unknown time  . ciprofloxacin (CIPRO) 500 MG tablet Take 1 tablet (500 mg total) by mouth 2 (two) times daily. (Patient not taking: Reported on 01/12/2015) 10 tablet 0 Not Taking at Unknown time  . lidocaine (LIDODERM) 5 % Place 1 patch onto the skin daily. Remove & Discard patch within 12 hours or as directed by MD (Patient not taking: Reported on 01/12/2015) 10 patch 0 Not Taking at Unknown time  . methocarbamol (ROBAXIN) 500 MG tablet Take 2 tablets (1,000 mg total) by mouth every 8 (eight) hours as needed for muscle spasms. (Patient not taking: Reported on 01/12/2015) 30 tablet 0 Not Taking at Unknown time  . oxyCODONE-acetaminophen (PERCOCET/ROXICET) 5-325 MG per tablet Take 1-2 tablets by mouth every 6 (six) hours as needed for moderate pain. (Patient not taking: Reported on 01/12/2015) 40 tablet 0 Not Taking at Unknown time  . phenazopyridine (PYRIDIUM) 100 MG tablet Take 1 tablet (  100 mg total) by mouth 3 (three) times daily with meals. (Patient not taking: Reported on 01/12/2015) 10 tablet 0 Not Taking at Unknown time    Previous Psychotropic Medications: No   Substance Abuse  History in the last 12 months:  Yes.    Consequences of Substance Abuse: Safety issues  Results for orders placed or performed during the hospital encounter of 01/12/15 (from the past 72 hour(s))  Acetaminophen level     Status: Abnormal   Collection Time: 01/12/15  9:44 PM  Result Value Ref Range   Acetaminophen (Tylenol), Serum <10.0 (L) 10 - 30 ug/mL    Comment:        THERAPEUTIC CONCENTRATIONS VARY SIGNIFICANTLY. A RANGE OF 10-30 ug/mL MAY BE AN EFFECTIVE CONCENTRATION FOR MANY PATIENTS. HOWEVER, SOME ARE BEST TREATED AT CONCENTRATIONS OUTSIDE THIS RANGE. ACETAMINOPHEN CONCENTRATIONS >150 ug/mL AT 4 HOURS AFTER INGESTION AND >50 ug/mL AT 12 HOURS AFTER INGESTION ARE OFTEN ASSOCIATED WITH TOXIC REACTIONS.   CBC     Status: Abnormal   Collection Time: 01/12/15  9:44 PM  Result Value Ref Range   WBC 8.2 4.0 - 10.5 K/uL   RBC 4.63 3.87 - 5.11 MIL/uL   Hemoglobin 10.2 (L) 12.0 - 15.0 g/dL   HCT 33.9 (L) 36.0 - 46.0 %   MCV 73.2 (L) 78.0 - 100.0 fL   MCH 22.0 (L) 26.0 - 34.0 pg   MCHC 30.1 30.0 - 36.0 g/dL   RDW 18.5 (H) 11.5 - 15.5 %   Platelets 769 (H) 150 - 400 K/uL  Comprehensive metabolic panel     Status: Abnormal   Collection Time: 01/12/15  9:44 PM  Result Value Ref Range   Sodium 142 135 - 145 mmol/L   Potassium 3.8 3.5 - 5.1 mmol/L   Chloride 108 96 - 112 mmol/L   CO2 21 19 - 32 mmol/L   Glucose, Bld 91 70 - 99 mg/dL   BUN 5 (L) 6 - 23 mg/dL   Creatinine, Ser 0.74 0.50 - 1.10 mg/dL   Calcium 9.0 8.4 - 10.5 mg/dL   Total Protein 7.3 6.0 - 8.3 g/dL   Albumin 3.5 3.5 - 5.2 g/dL   AST 24 0 - 37 U/L   ALT 21 0 - 35 U/L   Alkaline Phosphatase 68 39 - 117 U/L   Total Bilirubin 0.5 0.3 - 1.2 mg/dL   GFR calc non Af Amer >90 >90 mL/min   GFR calc Af Amer >90 >90 mL/min    Comment: (NOTE) The eGFR has been calculated using the CKD EPI equation. This calculation has not been validated in all clinical situations. eGFR's persistently <90 mL/min signify possible  Chronic Kidney Disease.    Anion gap 13 5 - 15  Ethanol (ETOH)     Status: Abnormal   Collection Time: 01/12/15  9:44 PM  Result Value Ref Range   Alcohol, Ethyl (B) 268 (H) 0 - 9 mg/dL    Comment:        LOWEST DETECTABLE LIMIT FOR SERUM ALCOHOL IS 11 mg/dL FOR MEDICAL PURPOSES ONLY   Salicylate level     Status: None   Collection Time: 01/12/15  9:44 PM  Result Value Ref Range   Salicylate Lvl <1.5 2.8 - 20.0 mg/dL  Urine Drug Screen     Status: None   Collection Time: 01/13/15 12:55 AM  Result Value Ref Range   Opiates NONE DETECTED NONE DETECTED   Cocaine NONE DETECTED NONE DETECTED   Benzodiazepines NONE DETECTED  NONE DETECTED   Amphetamines NONE DETECTED NONE DETECTED   Tetrahydrocannabinol NONE DETECTED NONE DETECTED   Barbiturates NONE DETECTED NONE DETECTED    Comment:        DRUG SCREEN FOR MEDICAL PURPOSES ONLY.  IF CONFIRMATION IS NEEDED FOR ANY PURPOSE, NOTIFY LAB WITHIN 5 DAYS.        LOWEST DETECTABLE LIMITS FOR URINE DRUG SCREEN Drug Class       Cutoff (ng/mL) Amphetamine      1000 Barbiturate      200 Benzodiazepine   409 Tricyclics       811 Opiates          300 Cocaine          300 THC              50   POC Urine Pregnancy, ED (if pre-menopausal female) - NOT at [facility]     Status: None   Collection Time: 01/13/15  1:27 AM  Result Value Ref Range   Preg Test, Ur NEGATIVE NEGATIVE    Comment:        THE SENSITIVITY OF THIS METHODOLOGY IS >24 mIU/mL     Observation Level/Precautions:  15 minute checks  Laboratory:  per ED  Psychotherapy:  Group therapy  Medications:  As per medlist  Consultations:  As needed  Discharge Concerns:  Safety  Estimated LOS:   5-7 days  Other:     Psychological Evaluations: Yes   Treatment Plan Summary: Daily contact with patient to assess and evaluate symptoms and progress in treatment and Medication management  Medical Decision Making:  Review or order clinical lab tests (1), Discuss test with performing  physician (1), Review and summation of old records (2) and Review of Medication Regimen & Side Effects (2)  I certify that inpatient services furnished can reasonably be expected to improve the patient's condition.   Kerrie Buffalo MAY, AGNP-BC 3/17/20168:55 AM

## 2015-01-13 NOTE — Tx Team (Signed)
Initial Interdisciplinary Treatment Plan   PATIENT STRESSORS: Health problems Marital or family conflict Substance abuse Traumatic event   PATIENT STRENGTHS: Ability for insight Average or above average intelligence Capable of independent living Communication skills Motivation for treatment/growth Supportive family/friends Work skills   PROBLEM LIST: Problem List/Patient Goals Date to be addressed Date deferred Reason deferred Estimated date of resolution  Suicidal ideation 01/13/15   At d/c  Substance abuse "to learn to deal with things without drinking" 01/13/15   At d/c  depression 01/13/15   At d/c  anxiety 01/13/15   At d/c                                 DISCHARGE CRITERIA:  Improved stabilization in mood, thinking, and/or behavior Motivation to continue treatment in a less acute level of care Reduction of life-threatening or endangering symptoms to within safe limits Verbal commitment to aftercare and medication compliance Withdrawal symptoms are absent or subacute and managed without 24-hour nursing intervention  PRELIMINARY DISCHARGE PLAN: Outpatient therapy Return to previous living arrangement Return to previous work or school arrangements  PATIENT/FAMIILY INVOLVEMENT: This treatment plan has been presented to and reviewed with the patient, Beth [name], and/or family member.  The patient and family have been given the opportunity to ask questions and make suggestions.  Celene Kras 01/13/2015, 3:18 AM

## 2015-01-13 NOTE — [facility] Group Notes (Signed)
[facility] Group Notes:  (Nursing/MHT/Case Management/Adjunct)  Date:  01/13/2015  Time:  0930 am  Type of Therapy:  Psychoeducational Skills  Participation Level:  Active  Participation Quality:  Appropriate and Attentive  Affect:  Appropriate  Cognitive:  Alert and Appropriate  Insight:  Appropriate  Engagement in Group:  Supportive  Modes of Intervention:  Support  Summary of Progress/Problems:  [place] Mon 01/13/2015, 10:31 AM
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
## 2015-01-13 NOTE — Progress Notes (Signed)
Patient ID: Beth [name], female   DOB: 03/01/1981, 34 y.o.   MRN: [id number] D: Patient complains of generalized aches and lethargy.  She reports withdrawal symptoms of tremors, chilling, nausea.  She denies SI/HI/AVH.  Patient was given 1000 mg tylenol for her pain, along with 400 mg skelaxin.  Patient attended group with some participation.  She does not feel well overall.  Her goal is "feeling better and going home."  She is interacting well with staff and her peers.  She presents with anxious affect; sad and depressed mood.  She rates her depression and hopelessness as a 0; anxiety as a 5.   A: Continue to monitor medication management and MD orders.  Safety checks completed every 15 per protocol.  Meet 1:1 with patient to discuss concerns and offer encouragement. R: Patient's behavior is appropriate to situation.

## 2015-01-13 NOTE — Progress Notes (Signed)
Recreation Therapy Notes  Animal-Assisted Activity/Therapy (AAA/T) Program Checklist/Progress Notes Patient Eligibility Criteria Checklist & Daily Group note for Rec Tx Intervention  Date: 03.17.2016 Time: 2:45pm Location: [address]   AAA/T Program Assumption of Risk Form signed by Patient/ or Parent Legal Guardian yes  Patient is free of allergies or sever asthma yes  Patient reports no fear of animals yes  Patient reports no history of cruelty to animals yes  Patient understands his/her participation is voluntary yes  Patient washes hands before animal contact yes  Patient washes hands after animal contact yes  Behavioral Response: Attentive, Appropriate   Education: Hand Washing, Appropriate Animal Interaction   Education Outcome: Acknowledges education.   Clinical Observations/Feedback: Patient interacted appropriately with dog team and peers during session.   Beth [name], LRT/CTRS  Beth [name] 01/13/2015 5:25 PM
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
## 2015-01-13 NOTE — Progress Notes (Addendum)
Pt presents to [facility] alert and cooperative. Pt reports recent SI, admits to considering driving her car of the road and verbally contracts for safety. -HI, -A/[employer].  Pt reports depression, anxiety, racing thoughts, insomnia and worrying.  She reports she was drinking 1/2 gallon to 2 gallons of vodka per day after her sister committed suicide three years ago. She was sober for two months and then returned to drinking yesterday. Report her stressors are conflict with father, break up with boyfriend and thoughts about deceased sister. Pt reports she makes herself throw up after eating at times, not to lose weight but to relieve pressure in her stomach. 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 c/o headache and left wrist pain (ace bandage wrapped) from fall yesterday, Simon PA and receiving RN made aware. AUDIT score 26, MD notified. Alcohol use education sheet reviewed and given.  Emotional support and encouragement given. Pt admitted for evaluation, stabilization and reduction of baseline. Will monitor closely.

## 2015-01-13 NOTE — ED Notes (Signed)
Report called to [place] at [facility].  No belongings with patient  Mother took everything home with her per patient.

## 2015-01-14 MED ORDER — SUMATRIPTAN SUCCINATE 50 MG PO TABS
50.0000 mg | ORAL_TABLET | ORAL | Status: DC | PRN
  Administered 2015-01-14 – 2015-01-20 (×13): 50 mg via ORAL
  Filled 2015-01-14 (×6): qty 1
  Filled 2015-01-14: qty 4
  Filled 2015-01-14 (×5): qty 1

## 2015-01-14 MED ORDER — DICLOFENAC SODIUM 1 % TD GEL
4.0000 g | Freq: Four times a day (QID) | TRANSDERMAL | Status: DC
  Administered 2015-01-14 – 2015-01-19 (×20): 4 g via TOPICAL
  Filled 2015-01-14: qty 100

## 2015-01-14 MED ORDER — NORTRIPTYLINE HCL 25 MG PO CAPS
25.0000 mg | ORAL_CAPSULE | Freq: Every day | ORAL | Status: DC
  Filled 2015-01-14: qty 1

## 2015-01-14 MED ORDER — METAXALONE 800 MG PO TABS
ORAL_TABLET | ORAL | Status: AC
  Administered 2015-01-14: 400 mg via ORAL
  Filled 2015-01-14: qty 1

## 2015-01-14 MED ORDER — GABAPENTIN 100 MG PO CAPS
200.0000 mg | ORAL_CAPSULE | Freq: Three times a day (TID) | ORAL | Status: DC
  Administered 2015-01-14 – 2015-01-17 (×9): 200 mg via ORAL
  Filled 2015-01-14 (×13): qty 2

## 2015-01-14 MED ORDER — BENZOCAINE 10 % MT GEL
Freq: Four times a day (QID) | OROMUCOSAL | Status: DC | PRN
  Administered 2015-01-14 – 2015-01-20 (×9): via OROMUCOSAL
  Filled 2015-01-14: qty 9.4

## 2015-01-14 MED ORDER — NORTRIPTYLINE HCL 25 MG PO CAPS
25.0000 mg | ORAL_CAPSULE | Freq: Every day | ORAL | Status: DC
  Administered 2015-01-14 – 2015-01-16 (×3): 25 mg via ORAL
  Filled 2015-01-14 (×6): qty 1

## 2015-01-14 MED ORDER — TRAZODONE HCL 150 MG PO TABS
150.0000 mg | ORAL_TABLET | Freq: Every evening | ORAL | Status: DC | PRN
  Administered 2015-01-14 – 2015-01-19 (×6): 150 mg via ORAL
  Filled 2015-01-14 (×4): qty 1
  Filled 2015-01-14: qty 3
  Filled 2015-01-14: qty 1

## 2015-01-14 MED ORDER — AMOXICILLIN 500 MG PO CAPS
500.0000 mg | ORAL_CAPSULE | Freq: Three times a day (TID) | ORAL | Status: DC
  Administered 2015-01-14 – 2015-01-20 (×18): 500 mg via ORAL
  Filled 2015-01-14 (×3): qty 1
  Filled 2015-01-14: qty 9
  Filled 2015-01-14 (×2): qty 1
  Filled 2015-01-14: qty 9
  Filled 2015-01-14 (×6): qty 1
  Filled 2015-01-14: qty 9
  Filled 2015-01-14 (×3): qty 1
  Filled 2015-01-14: qty 9
  Filled 2015-01-14 (×2): qty 1
  Filled 2015-01-14: qty 9
  Filled 2015-01-14 (×8): qty 1
  Filled 2015-01-14: qty 9

## 2015-01-14 NOTE — B[facility] Group Notes (Signed)
B[facility] LCSW Group Therapy  01/14/2015 4:42 PM  Type of Therapy:  Group Therapy  Participation Level:  Did Not Attend-pt continuing to report high anxiety and is doing breathing exercises in her room. Pt excused from group.   Summary of Progress/Problems: Feelings around Relapse. Group members discussed the meaning of relapse and shared personal stories of relapse, how it affected them and others, and how they perceived themselves during this time. Group members were encouraged to identify triggers, warning signs and coping skills used when facing the possibility of relapse. Social supports were discussed and explored in detail. Post Acute Withdrawal Syndrome (handout provided) was introduced and examined. Pt's were encouraged to ask questions, talk about key points associated with PAWS, and process this information in terms of relapse prevention.   Smart, Damareon Lanni LCSWA  01/14/2015, 4:42 PM

## 2015-01-14 NOTE — BHH Suicide Risk Assessment (Signed)
B[facility] INPATIENT:  Family/Significant Other Suicide Prevention Education  Suicide Prevention Education:  Contact Attempts: Mellody Drown (pt's mother) [phone] has been identified by the patient as the family member/significant other with whom the patient will be residing, and identified as the person(s) who will aid the patient in the event of a mental health crisis.  With written consent from the patient, two attempts were made to provide suicide prevention education, prior to and/or following the patient's discharge.  We were unsuccessful in providing suicide prevention education.  A suicide education pamphlet was given to the patient to share with family/significant other.  Date and time of first attempt: 01/14/15 (voicemail left at 12:50PM requesting call back at her earliest convenience).    Pt's mother called back. SPE completed. Aftercare plan reviewed with pt's mother. She verbalized understanding of all information presented and has no concerns regarding pt d/cing home when ready.   Smart, Jerlean Peralta LCSWA  01/14/2015, 12:52 PM

## 2015-01-14 NOTE — Progress Notes (Signed)
EKG completed

## 2015-01-14 NOTE — Progress Notes (Signed)
Recreation Therapy Notes  Date: 03.18.2016 Time: 9:30am Location: [address]   Group Topic: Stress Management  Goal Area(s) Addresses:  Patient will actively participate in stress management techniques presented during session.   Behavioral Response: Did not attend.   Attilio Zeitler L Holbert Caples, LRT/CTRS  Minaal Struckman L 01/14/2015 4:21 PM 

## 2015-01-14 NOTE — Progress Notes (Signed)
Beth [name] has ahd a difficult day today. Between her toothache ( on both sides of her mouth), her anxiety and her migraine headache , she has had one complaint  After another.   A She remains  very quiet, sad, deperessed and flat. She does complete her morning assessment and on it she  Writes she denies SI today and she rates her depression, hopelessness and anxiety " 2/0/6/", respectively.    R She has had multiple med order cahnges today , to address her complaints and reports the imitrex she was given " really helped" her migraine headache. POC cont.

## 2015-01-14 NOTE — Progress Notes (Addendum)
[facility] MD Progress Note  01/14/2015 12:05 PM Beth [name]  MRN:  [id number] Subjective: Patient states " I am not doing well , I feel so anxious and tearful and feel like I need help today, I am not sure what is going on. "  Objective:Patient seen and chart reviewed.Patient discussed with treatment team. Pt today appears to be depressed ,anxious , reports withdrawal sx as well as being in pain. Patient had a fall prior to coming , has lacerations on her left forearm, xray done in ED (HAND) is negative. Offered supportive care like ice pack as well as topical medication for pain. Pt also with a headache , reports taking imitrex in the past. Patient also with tooth ache, will provide supportive care for the same. Discussed starting medication for depression as well as anxiety. She was not very receptive yesterday , but today reports wanting to be on it. Pt with past hx of being on SSRI and SNRI ,reports that they make her feel awful. Hence discussed starting a TCA. Will get EKG for baseline. Patient agreeable.    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 Alcohol use disorder, moderate, dependence [F10.20] 01/13/2015  . Free intraperitoneal air [K66.8] 08/18/2014  . Perforated ulcer [K27.5] 08/18/2014  . 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 Acid reflux [K21.9]   . History of blood transfusion [Z92.89]   . Multiple sclerosis [G35] 03/13/2012  . H/O gastric bypass [Z98.84] 03/13/2012   Total Time spent with patient: 30 minutes   Past  Medical History:  Past Medical History  Diagnosis Date  . Acid reflux   . Asthma   . History of blood transfusion   . Multiple sclerosis 2005    diagnosed in [place], failed steroids, no other medication  . Anxiety   . Gastric ulcer   . Migraine   . Chronic nausea     self reported  . Chronic diarrhea     self reported  . Chronic pain     self reported chronic pain, back pain, fibromyalgia  . Insomnia   . Polysubstance abuse   . ETOH abuse   . Delirium tremens     Past Surgical History  Procedure Laterality Date  . Gastric bypass    . Diagnostic laparoscopy  2003    repair of perforation at [facility] anastamosis   . Lumbar laminectomy/decompression microdiscectomy Right 04/14/2014    Procedure: LUMBAR LAMINECTOMY/DECOMPRESSION MICRODISCECTOMY LEVEL L4-5;  Surgeon: Maeola Harman, MD;  Location: [facility];  Service: Neurosurgery;  Laterality: Right;  LUMBAR LAMINECTOMY/DECOMPRESSION MICRODISCECTOMY LEVEL L4-5  . Laparotomy N/A 08/18/2014    Procedure: EXPLORATORY LAPAROTOMY, Patch graft of gastric perforation;  Surgeon: Abigail Miyamoto, MD;  Location: [facility] OR;  Service: General;  Laterality: N/A;   Family History:  Family History  Problem Relation Age of Onset  . Hypertension Paternal Grandfather   . Colon cancer Paternal Grandfather 19  . Asthma Paternal Grandmother   . Hypertension Paternal Grandmother   . Breast cancer Paternal Grandmother   . Cancer Paternal Grandmother     lung   . Diabetes Maternal Grandmother   .  Diabetes Maternal Grandfather   . Hypertension Father   . Urinary tract infection Mother   . Stroke Mother 7    3 strokes    Social History:  History  Alcohol Use  . 195.6 oz/week  . 25 Cans of beer, 301 Shots of liquor per week    Comment: Says she can drink up to a 1/2 gallon/day     History  Drug Use No    Comment: cocaine-none >1 year.  meth-none >10 years.    History   Social History  . Marital Status: Married    Spouse Name: N/A  . Number  of Children: N/A  . Years of Education: N/A   Social History Main Topics  . Smoking status: Current Some Day Smoker -- 0.10 packs/day    Types: Cigarettes  . Smokeless tobacco: Never Used  . Alcohol Use: 195.6 oz/week    25 Cans of beer, 301 Shots of liquor per week     Comment: Says she can drink up to a 1/2 gallon/day  . Drug Use: No     Comment: cocaine-none >1 year.  meth-none >10 years.  . Sexual Activity: Yes    Birth Control/ Protection: None   Other Topics Concern  . None   Social History Narrative   Additional History:    Sleep: Fair  Appetite:  Poor    Musculoskeletal: Strength & Muscle Tone: within normal limits Gait & Station: normal Patient leans: N/A   Psychiatric Specialty Exam: Physical Exam  Review of Systems  Musculoskeletal: Positive for myalgias.  Neurological: Positive for headaches.  Psychiatric/Behavioral: Positive for depression, hallucinations and substance abuse. The patient is nervous/anxious and has insomnia.     Blood pressure 120/74, pulse 114, temperature 97.6 F (36.4 C), temperature source Oral, resp. rate 20, height 5\' 6"  (1.676 m), weight 104.101 kg (229 lb 8 oz), last menstrual period 12/14/2014.Body mass index is 37.06 kg/(m^2).  General Appearance: Fairly Groomed  Patent attorney::  Fair  Speech:  Clear and Coherent  Volume:  Decreased  Mood:  Anxious and Depressed  Affect:  Tearful  Thought Process:  Coherent  Orientation:  Full (Time, Place, and Person)  Thought Content:  Rumination  Suicidal Thoughts:  No  Homicidal Thoughts:  No  Memory:  Immediate;   Fair Recent;   Fair Remote;   Fair  Judgement:  Impaired  Insight:  Lacking  Psychomotor Activity:  Restlessness  Concentration:  Fair  Recall:  [employer] of Knowledge:Fair  Language: Fair  Akathisia:  No  Handed:  Right  AIMS (if indicated):     Assets:  Communication Skills Desire for Improvement  ADL's:  Intact  Cognition: WNL  Sleep:  Number of Hours: 6.25      Current Medications: Current Facility-Administered Medications  Medication Dose Route Frequency Provider Last Rate Last Dose  . acetaminophen (TYLENOL) tablet 650 mg  650 mg Oral Q6H PRN Kerry Hough, PA-C   650 mg at 01/13/15 2149  . alum & mag hydroxide-simeth (MAALOX/MYLANTA) 200-200-20 MG/5ML suspension 30 mL  30 mL Oral Q4H PRN Kerry Hough, PA-C      . amoxicillin (AMOXIL) capsule 500 mg  500 mg Oral 3 times per day Jomarie Longs, MD      . benzocaine (ORAJEL) 10 % mucosal gel   Mouth/Throat QID PRN Jomarie Longs, MD      . chlordiazePOXIDE (LIBRIUM) capsule 25 mg  25 mg Oral Q6H PRN Kerry Hough, PA-C   25 mg  at 01/13/15 0313  . chlordiazePOXIDE (LIBRIUM) capsule 25 mg  25 mg Oral TID Kerry Hough, PA-C   25 mg at 01/14/15 1155   Followed by  . [START ON 01/15/2015] chlordiazePOXIDE (LIBRIUM) capsule 25 mg  25 mg Oral BH-qamhs Spencer E Simon, PA-C       Followed by  . [START ON 01/16/2015] chlordiazePOXIDE (LIBRIUM) capsule 25 mg  25 mg Oral Daily Spencer E Simon, PA-C      . diclofenac sodium (VOLTAREN) 1 % transdermal gel 4 g  4 g Topical QID Andon Villard, MD      . gabapentin (NEURONTIN) capsule 200 mg  200 mg Oral TID Jomarie Longs, MD   200 mg at 01/14/15 1156  . hydrOXYzine (ATARAX/VISTARIL) tablet 25 mg  25 mg Oral Q6H PRN Kerry Hough, PA-C   25 mg at 01/14/15 0906  . loperamide (IMODIUM) capsule 2-4 mg  2-4 mg Oral PRN Kerry Hough, PA-C      . magnesium hydroxide (MILK OF MAGNESIA) suspension 30 mL  30 mL Oral Daily PRN Kerry Hough, PA-C      . metaxalone [facility]) tablet 400 mg  400 mg Oral TID Adonis Brook, NP   400 mg at 01/14/15 1155  . multivitamin with minerals tablet 1 tablet  1 tablet Oral Daily Kerry Hough, PA-C   1 tablet at 01/14/15 0900  . neomycin-bacitracin-polymyxin (NEOSPORIN) ointment   Topical BID Shuvon B Rankin, NP      . nortriptyline (PAMELOR) capsule 25 mg  25 mg Oral QHS Rebekah Zackery, MD      . ondansetron  (ZOFRAN-ODT) disintegrating tablet 4 mg  4 mg Oral Q6H PRN Kerry Hough, PA-C   4 mg at 01/14/15 1155  . SUMAtriptan (IMITREX) tablet 50 mg  50 mg Oral Q2H PRN Jomarie Longs, MD   50 mg at 01/14/15 1027  . thiamine (B-1) injection 100 mg  100 mg Intramuscular Once Kerry Hough, PA-C   100 mg at 01/13/15 0316  . thiamine (VITAMIN B-1) tablet 100 mg  100 mg Oral Daily Kerry Hough, PA-C   100 mg at 01/14/15 0900  . traZODone (DESYREL) tablet 150 mg  150 mg Oral QHS PRN Jomarie Longs, MD        Lab Results:  Results for orders placed or performed during the hospital encounter of 01/13/15 (from the past 48 hour(s))  TSH     Status: None   Collection Time: 01/13/15  7:48 PM  Result Value Ref Range   TSH 1.649 0.350 - 4.500 uIU/mL    Comment: Performed at [facility]    Physical Findings: AIMS: Facial and Oral Movements Muscles of Facial Expression: None, normal Lips and Perioral Area: None, normal Jaw: None, normal Tongue: None, normal,Extremity Movements Upper (arms, wrists, hands, fingers): None, normal Lower (legs, knees, ankles, toes): None, normal, Trunk Movements Neck, shoulders, hips: None, normal, Overall Severity Severity of abnormal movements (highest score from questions above): None, normal Incapacitation due to abnormal movements: None, normal Patient's awareness of abnormal movements (rate only patient's report): No Awareness, Dental Status Current problems with teeth and/or dentures?: Yes ("holes in teeth') Does patient usually wear dentures?: No  CIWA:  CIWA-Ar Total: 5 COWS:     Assessment: Patient is a 82 y old CF who presented after relapsing on alcohol , continues to be withdrawing , has extreme anxiety sx as well as depression. Will add an antidepressant . Will continue to manage.  Treatment Plan Summary: Daily contact with patient to assess and evaluate symptoms and progress in treatment and Medication management Continue  CIWA/librium protocol. Vistaril for anxiety sx, Trazodone for sleep. Will start Pamelor 25 mg po qhs , to be increased if needed during the weekend. Will get baseline EKG since we are starting a TCA. Pt reports not wanting to be on an SSRI/SNRI. Reports extreme anxiety sx. Will add Gabapentin 200 mg po tid. Will provide orajel for tooth ache , empirical treatment with amoxicillin 500 mg po tid. Provide imitrex for headache as needed. Provide ice pack for arm/forearm pain as well as diclofenac topical Rx.  Will continue to monitor vitals ,medication compliance and treatment side effects while patient is here.  Will monitor for medical issues as well as call consult as needed.  Reviewed labs ,TSH - wnl. CSW will start working on disposition.  Patient to participate in therapeutic milieu   Medical Decision Making:  Review of Psycho-Social Stressors (1), Review or order clinical lab tests (1), Established Problem, Worsening (2), Review of Last Therapy Session (1), Review of Medication Regimen & Side Effects (2) and Review of New Medication or Change in Dosage (2)     [place] Welcome MD 01/14/2015, 12:05 PM

## 2015-01-14 NOTE — Plan of Care (Signed)
Problem: Ineffective individual coping Goal: STG: Patient will remain free from self harm Outcome: Progressing Pt stated she felt better  Problem: Diagnosis: Increased Risk For Suicide Attempt Goal: LTG-Patient Will Show Positive Response to Medication LTG (by discharge) : Patient will show positive response to medication and will participate in the development of the discharge plan.  Outcome: Progressing Pt denies SI at this time

## 2015-01-14 NOTE — [facility] Group Notes (Signed)
[facility] LCSW Aftercare Discharge Planning Group Note   01/14/2015 10:25 AM  Participation Quality:  Minimal   Mood/Affect:  Anxious  Depression Rating:  5  Anxiety Rating:  9  Thoughts of Suicide:  No Will you contract for safety?   NA  Current AVH:  No  Plan for Discharge/Comments:  Pt reports that she wanted to talk to [place] in private after group. CSW met with pt. She stated that she woke up feeling "very anxious" and was tearful during discussion. CSW informed MD who will meet with pt to discuss medications. Pt reports that she will return home at d/c and would like referral for med management/therpay. CSW assessing.   Transportation Means: mother   Supports: mother/some friends.   [employer]

## 2015-01-15 DIAGNOSIS — F10980 Alcohol use, unspecified with alcohol-induced anxiety disorder: Secondary | ICD-10-CM

## 2015-01-15 MED ORDER — LIDOCAINE 5 % EX PTCH
MEDICATED_PATCH | CUTANEOUS | Status: AC
  Administered 2015-01-15: 15:00:00
  Filled 2015-01-15: qty 1

## 2015-01-15 MED ORDER — NICOTINE 21 MG/24HR TD PT24
21.0000 mg | MEDICATED_PATCH | Freq: Every day | TRANSDERMAL | Status: DC
  Administered 2015-01-15 – 2015-01-19 (×5): 21 mg via TRANSDERMAL
  Filled 2015-01-15 (×7): qty 1

## 2015-01-15 MED ORDER — METAXALONE 800 MG PO TABS
800.0000 mg | ORAL_TABLET | Freq: Three times a day (TID) | ORAL | Status: DC
  Administered 2015-01-15 – 2015-01-20 (×15): 800 mg via ORAL
  Filled 2015-01-15 (×23): qty 1

## 2015-01-15 MED ORDER — HYDROXYZINE HCL 50 MG PO TABS
50.0000 mg | ORAL_TABLET | Freq: Four times a day (QID) | ORAL | Status: AC | PRN
  Administered 2015-01-15: 50 mg via ORAL
  Filled 2015-01-15 (×2): qty 1

## 2015-01-15 MED ORDER — LIDOCAINE 5 % EX PTCH
1.0000 | MEDICATED_PATCH | CUTANEOUS | Status: DC
  Administered 2015-01-15 – 2015-01-19 (×4): 1 via TRANSDERMAL
  Filled 2015-01-15 (×4): qty 1
  Filled 2015-01-15: qty 3
  Filled 2015-01-15: qty 1
  Filled 2015-01-15: qty 3
  Filled 2015-01-15: qty 1

## 2015-01-15 NOTE — [facility] Group Notes (Signed)
[facility] Group Notes:  (Clinical Social Work)  01/15/2015     10-11AM  Summary of Progress/Problems:   The main focus of today's process group was to learn how to use a decisional balance exercise to move forward in the Stages of Change, which were described and discussed.  Motivational Interviewing and a worksheet were utilized to help patients explore in depth the perceived benefits and costs of a self-sabotaging behavior, as well as the  benefits and costs of replacing that with a healthy coping mechanism.   The patient expressed that drinking/drugging is an unhealthy coping skill that she uses.  She expressed that she felt unable to participate in the Decisional Balance Exercise regarding isolation & withdrawal, because it is not something she chooses to do.  Lack insight, had numerous side conversations, was in and out of room.  Type of Therapy:  Group Therapy - Process    Participation Level:  Active  Participation Quality:  Inattentive  Affect:  Defensive  Cognitive:  Oriented  Insight:  Limited  Engagement in Therapy:  Limited  Modes of Intervention:  Education, Motivational Interviewing  Ambrose Mantle, LCSW 01/15/2015, 12:54 PM

## 2015-01-15 NOTE — Progress Notes (Signed)
Patient did attend the evening speaker AA meeting.  

## 2015-01-15 NOTE — Progress Notes (Signed)
Patient ID: Beth [name], female   DOB: 09/18/1981, 34 y.o.   MRN: [id number] [facility] MD Progress Note  01/15/2015 1:38 PM Beth [name]  MRN:  [id number] Subjective: Patient states " I still feel anxious and my side (from a fall) is still hurting."  Objective:  Patient seen and chart reviewed.Patient discussed with treatment team. Pt today appears to be depressed ,anxious as well as being in pain. Patient had a fall prior to coming, has lacerations on her left forearm, xray done in ED (HAND) is negative. Offered supportive care like ice pack as well as topical medication for pain. Pt also with a headache, reports taking imitrex in the past. Patient also with tooth ache, will provide supportive care for the same. Discussed starting medication for depression as well as anxiety. She was not very receptive yesterday, but today reports wanting to be on it. Pt with past hx of being on SSRI and SNRI ,reports that they make her feel awful. Hence discussed starting a TCA.   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 Alcohol use disorder, moderate, dependence [F10.20] 01/13/2015  . Free intraperitoneal air [K66.8] 08/18/2014  . Perforated ulcer [K27.5] 08/18/2014  . 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 Acid reflux [K21.9]   . History of blood transfusion [Z92.89]   . Multiple sclerosis [G35] 03/13/2012  . H/O gastric bypass [Z98.84] 03/13/2012   Total Time spent with patient: 30 minutes   Past Medical History:  Past Medical History  Diagnosis  Date  . Acid reflux   . Asthma   . History of blood transfusion   . Multiple sclerosis 2005    diagnosed in [place], failed steroids, no other medication  . Anxiety   . Gastric ulcer   . Migraine   . Chronic nausea     self reported  . Chronic diarrhea     self reported  . Chronic pain     self reported chronic pain, back pain, fibromyalgia  . Insomnia   . Polysubstance abuse   . ETOH abuse   . Delirium tremens     Past Surgical History  Procedure Laterality Date  . Gastric bypass    . Diagnostic laparoscopy  2003    repair of perforation at [facility] anastamosis   . Lumbar laminectomy/decompression microdiscectomy Right 04/14/2014    Procedure: LUMBAR LAMINECTOMY/DECOMPRESSION MICRODISCECTOMY LEVEL L4-5;  Surgeon: Maeola Harman, MD;  Location: [facility];  Service: Neurosurgery;  Laterality: Right;  LUMBAR LAMINECTOMY/DECOMPRESSION MICRODISCECTOMY LEVEL L4-5  . Laparotomy N/A 08/18/2014    Procedure: EXPLORATORY LAPAROTOMY, Patch graft of gastric perforation;  Surgeon: Abigail Miyamoto, MD;  Location: [facility] OR;  Service: General;  Laterality: N/A;   Family History:  Family History  Problem Relation Age of Onset  . Hypertension Paternal Grandfather   . Colon cancer Paternal Grandfather 24  . Asthma Paternal Grandmother   . Hypertension Paternal Grandmother   . Breast cancer Paternal Grandmother   . Cancer Paternal Grandmother     lung   . Diabetes Maternal Grandmother   . Diabetes Maternal Grandfather   . Hypertension Father   . Urinary  tract infection Mother   . Stroke Mother 47    3 strokes    Social History:  History  Alcohol Use  . 195.6 oz/week  . 25 Cans of beer, 301 Shots of liquor per week    Comment: Says she can drink up to a 1/2 gallon/day     History  Drug Use No    Comment: cocaine-none >1 year.  meth-none >10 years.    History   Social History  . Marital Status: Married    Spouse Name: N/A  . Number of Children: N/A  . Years of Education: N/A    Social History Main Topics  . Smoking status: Current Some Day Smoker -- 0.10 packs/day    Types: Cigarettes  . Smokeless tobacco: Never Used  . Alcohol Use: 195.6 oz/week    25 Cans of beer, 301 Shots of liquor per week     Comment: Says she can drink up to a 1/2 gallon/day  . Drug Use: No     Comment: cocaine-none >1 year.  meth-none >10 years.  . Sexual Activity: Yes    Birth Control/ Protection: None   Other Topics Concern  . None   Social History Narrative   Additional History:    Sleep: Fair  Appetite:  Poor    Musculoskeletal: Strength & Muscle Tone: within normal limits Gait & Station: normal Patient leans: N/A   Psychiatric Specialty Exam: Physical Exam  Vitals reviewed.   Review of Systems  Psychiatric/Behavioral: Positive for depression. The patient is nervous/anxious.     Blood pressure 111/59, pulse 109, temperature 97.6 F (36.4 C), temperature source Oral, resp. rate 20, height  (1.676 m), weight 104.101 kg (229 lb 8 oz), last menstrual period 12/14/2014.Body mass index is 37.06 kg/(m^2).  General Appearance: Fairly Groomed  Patent attorney::  Fair  Speech:  Clear and Coherent  Volume:  Decreased  Mood:  Anxious and Depressed  Affect:  Tearful  Thought Process:  Coherent  Orientation:  Full (Time, Place, and Person)  Thought Content:  Rumination  Suicidal Thoughts:  No  Homicidal Thoughts:  No  Memory:  Immediate;   Fair Recent;   Fair Remote;   Fair  Judgement:  Impaired  Insight:  Lacking  Psychomotor Activity:  Restlessness  Concentration:  Fair  Recall:  [employer] of Knowledge:Fair  Language: Fair  Akathisia:  No  Handed:  Right  AIMS (if indicated):     Assets:  Communication Skills Desire for Improvement  ADL's:  Intact  Cognition: WNL  Sleep:  Number of Hours: 6.25     Current Medications: Current Facility-Administered Medications  Medication Dose Route Frequency Provider Last Rate Last Dose  . acetaminophen  (TYLENOL) tablet 650 mg  650 mg Oral Q6H PRN Kerry Hough, PA-C   650 mg at 01/14/15 2209  . alum & mag hydroxide-simeth (MAALOX/MYLANTA) 200-200-20 MG/5ML suspension 30 mL  30 mL Oral Q4H PRN Kerry Hough, PA-C      . amoxicillin (AMOXIL) capsule 500 mg  500 mg Oral 3 times per day Jomarie Longs, MD   500 mg at 01/15/15 0629  . benzocaine (ORAJEL) 10 % mucosal gel   Mouth/Throat QID PRN Jomarie Longs, MD      . chlordiazePOXIDE (LIBRIUM) capsule 25 mg  25 mg Oral Q6H PRN Kerry Hough, PA-C   25 mg at 01/13/15 0313  . chlordiazePOXIDE (LIBRIUM) capsule 25 mg  25 mg Oral BH-qamhs Kerry Hough, PA-C   25  mg at 01/15/15 0838   Followed by  . [START ON 01/16/2015] chlordiazePOXIDE (LIBRIUM) capsule 25 mg  25 mg Oral Daily Spencer E Simon, PA-C      . diclofenac sodium (VOLTAREN) 1 % transdermal gel 4 g  4 g Topical QID Jomarie Longs, MD   4 g at 01/15/15 1250  . gabapentin (NEURONTIN) capsule 200 mg  200 mg Oral TID Jomarie Longs, MD   200 mg at 01/15/15 1250  . hydrOXYzine (ATARAX/VISTARIL) tablet 50 mg  50 mg Oral Q6H PRN Adonis Brook, NP      . lidocaine (LIDODERM) 5 % 1 patch  1 patch Transdermal Q24H Adonis Brook, NP      . loperamide (IMODIUM) capsule 2-4 mg  2-4 mg Oral PRN Kerry Hough, PA-C      . magnesium hydroxide (MILK OF MAGNESIA) suspension 30 mL  30 mL Oral Daily PRN Kerry Hough, PA-C      . metaxalone [facility]) tablet 800 mg  800 mg Oral TID Adonis Brook, NP      . multivitamin with minerals tablet 1 tablet  1 tablet Oral Daily Kerry Hough, PA-C   1 tablet at 01/15/15 8416  . neomycin-bacitracin-polymyxin (NEOSPORIN) ointment   Topical BID Shuvon B Rankin, NP      . nicotine (NICODERM CQ - dosed in mg/24 hours) patch 21 mg  21 mg Transdermal Daily Rachael Fee, MD   21 mg at 01/15/15 0841  . nortriptyline (PAMELOR) capsule 25 mg  25 mg Oral QHS Jomarie Longs, MD   25 mg at 01/14/15 2204  . ondansetron (ZOFRAN-ODT) disintegrating tablet 4 mg  4 mg  Oral Q6H PRN Kerry Hough, PA-C   4 mg at 01/15/15 0955  . SUMAtriptan (IMITREX) tablet 50 mg  50 mg Oral Q2H PRN Jomarie Longs, MD   50 mg at 01/15/15 0844  . thiamine (B-1) injection 100 mg  100 mg Intramuscular Once Kerry Hough, PA-C   100 mg at 01/13/15 0316  . thiamine (VITAMIN B-1) tablet 100 mg  100 mg Oral Daily Kerry Hough, PA-C   100 mg at 01/15/15 6063  . traZODone (DESYREL) tablet 150 mg  150 mg Oral QHS PRN Jomarie Longs, MD   150 mg at 01/14/15 2300    Lab Results:  Results for orders placed or performed during the hospital encounter of 01/13/15 (from the past 48 hour(s))  TSH     Status: None   Collection Time: 01/13/15  7:48 PM  Result Value Ref Range   TSH 1.649 0.350 - 4.500 uIU/mL    Comment: Performed at [facility]    Physical Findings: AIMS: Facial and Oral Movements Muscles of Facial Expression: None, normal Lips and Perioral Area: None, normal Jaw: None, normal Tongue: None, normal,Extremity Movements Upper (arms, wrists, hands, fingers): None, normal Lower (legs, knees, ankles, toes): None, normal, Trunk Movements Neck, shoulders, hips: None, normal, Overall Severity Severity of abnormal movements (highest score from questions above): None, normal Incapacitation due to abnormal movements: None, normal Patient's awareness of abnormal movements (rate only patient's report): No Awareness, Dental Status Current problems with teeth and/or dentures?: Yes ("holes in teeth') Does patient usually wear dentures?: No  CIWA:  CIWA-Ar Total: 5 COWS:     Assessment: Patient is a 40 y old CF who presented after relapsing on alcohol , continues to be withdrawing , has extreme anxiety sx as well as depression. Will add an antidepressant . Will continue  to manage.    Treatment Plan Summary: Daily contact with patient to assess and evaluate symptoms and progress in treatment and Medication management Continue CIWA/librium  protocol. Increased 50 50 mg Vistaril for anxiety sx, Trazodone for sleep. Skelaxin increased to 800 mg for persistent musculoskeletal pain Will start Pamelor 25 mg po qhs , to be increased if needed during the weekend. Will get baseline EKG since we are starting a TCA. Pt reports not wanting to be on an SSRI/SNRI. Reports extreme anxiety sx. Will add Gabapentin 200 mg po tid. Will provide orajel for tooth ache , empirical treatment with amoxicillin 500 mg po tid. Provide imitrex for headache as needed. Provide ice pack for arm/forearm pain as well as diclofenac topical Rx.  Added Lidoderm patch  Will continue to monitor vitals ,medication compliance and treatment side effects while patient is here.  Will monitor for medical issues as well as call consult as needed.  Reviewed labs ,TSH - wnl. CSW will start working on disposition.  Patient to participate in therapeutic milieu   Medical Decision Making:  Review of Psycho-Social Stressors (1), Review or order clinical lab tests (1), Established Problem, Worsening (2), Review of Last Therapy Session (1), Review of Medication Regimen & Side Effects (2) and Review of New Medication or Change in Dosage (2)  Toyia Jelinek MAY, AGNP-BC 01/15/2015, 1:38 PM

## 2015-01-15 NOTE — Progress Notes (Signed)
Beth [name], while looking like she feels better, cont to struggle with vomiting immediately after each meal ( she says " I always do this".    A She completes her daily assessment and on it she writes she denies SI today and she rates her depression, hopelessness and anxiety " 3/2/6" She has attended her  Groups and she is engaged in trying to get better.   R Safety is in place .

## 2015-01-15 NOTE — Progress Notes (Signed)
Psychoeducational Group Note  Date:  01/15/2015 Time: 1315  Group Topic/Focus:  Identifying Needs:   The focus of this group is to help patients identify their personal needs that have been historically problematic and identify healthy behaviors to address their needs.  Participation Level:  Minimal  Participation Quality:  Attentive  Affect:  Depressed  Cognitive:  Appropriate  Insight:  Engaged  Engagement in Group:  Engaged  Additional Comments:    01/15/2015,5:19 PM Cloris Flippo, Joie Bimler

## 2015-01-16 MED ORDER — TRAMADOL HCL 50 MG PO TABS
50.0000 mg | ORAL_TABLET | Freq: Once | ORAL | Status: AC
  Administered 2015-01-16: 50 mg via ORAL

## 2015-01-16 MED ORDER — HYDROXYZINE HCL 50 MG PO TABS
50.0000 mg | ORAL_TABLET | Freq: Four times a day (QID) | ORAL | Status: DC | PRN
  Administered 2015-01-16: 50 mg via ORAL

## 2015-01-16 MED ORDER — BUSPIRONE HCL 5 MG PO TABS
5.0000 mg | ORAL_TABLET | Freq: Two times a day (BID) | ORAL | Status: DC
Start: 2015-01-16 — End: 2015-01-22
  Administered 2015-01-16 – 2015-01-20 (×8): 5 mg via ORAL
  Filled 2015-01-16 (×9): qty 1
  Filled 2015-01-16 (×3): qty 6
  Filled 2015-01-16 (×2): qty 1
  Filled 2015-01-16: qty 6
  Filled 2015-01-16: qty 1

## 2015-01-16 MED ORDER — SUMATRIPTAN SUCCINATE 50 MG PO TABS
ORAL_TABLET | ORAL | Status: AC
  Administered 2015-01-16: 09:00:00
  Filled 2015-01-16: qty 1

## 2015-01-16 MED ORDER — HYDROXYZINE HCL 25 MG PO TABS
25.0000 mg | ORAL_TABLET | Freq: Three times a day (TID) | ORAL | Status: DC | PRN
  Administered 2015-01-16 – 2015-01-20 (×11): 25 mg via ORAL
  Filled 2015-01-16 (×10): qty 1
  Filled 2015-01-16: qty 10
  Filled 2015-01-16 (×2): qty 1

## 2015-01-16 MED ORDER — TRAMADOL HCL 50 MG PO TABS
ORAL_TABLET | ORAL | Status: AC
  Administered 2015-01-16: 12:00:00
  Filled 2015-01-16: qty 1

## 2015-01-16 MED ORDER — PRAZOSIN HCL 1 MG PO CAPS
1.0000 mg | ORAL_CAPSULE | Freq: Every day | ORAL | Status: AC
  Administered 2015-01-16: 1 mg via ORAL
  Filled 2015-01-16: qty 1

## 2015-01-16 MED ORDER — HYDROXYZINE HCL 50 MG PO TABS
50.0000 mg | ORAL_TABLET | Freq: Every day | ORAL | Status: DC
Start: 2015-01-16 — End: 2015-01-16

## 2015-01-16 MED ORDER — HYDROXYZINE HCL 50 MG/ML IM SOLN
50.0000 mg | Freq: Three times a day (TID) | INTRAMUSCULAR | Status: DC | PRN
  Filled 2015-01-16: qty 1

## 2015-01-16 NOTE — Progress Notes (Signed)
Psychoeducational Group Note  Date:  01/16/2015 Time: 1015 Group Topic/Focus:  Making Healthy Choices:   The focus of this group is to help patients identify negative/unhealthy choices they were using prior to admission and identify positive/healthier coping strategies to replace them upon discharge.  Participation Level:  Minimal  Participation Quality:  Resistant  Affect:  Labile  Cognitive:  Disorganized  Insight:  Limited  Engagement in Group:  Limited  Additional Comments:  Although Kayani was IN the group room, she was experiencing increased anxiety, kept her head bowed down to her chest and  Could not participate.  Rich Brave 1:12 PM. 01/16/2015

## 2015-01-16 NOTE — Progress Notes (Signed)
Patient with anxiety and hydroxyzine 50mg  PO every 6 hours prn anxiety ordered.   Hulan Fess, PMHNP-BC

## 2015-01-16 NOTE — Progress Notes (Signed)
Patient did attend the evening speaker AA meeting.  

## 2015-01-16 NOTE — [facility] Group Notes (Signed)
[facility] Group Notes:  (Clinical Social Work)  01/16/2015   10:00am-11:00am  Summary of Progress/Problems:  The main focus of today's process group was to discuss adding healthy supports to enhance recovery.  The need for support and types that are available was explored in the first half of group.  For the second half, patients listened to various genres of music and identified their emotional responses.  Handouts were used to record feelings evoked, as well as how patient can personally use this knowledge in sleep habits, with depression, and with other symptoms.  Initially, the patient was tearful and rocking/shaking, talking to another patient.  CSW engaged her and she requested a specific song, which appeared to calm her significantly.  Later, the patient expressed understanding of concepts, as well as knowledge of how each type of music affected her and how this can be used at home as a wellness/recovery tool.    Type of Therapy:  Music Therapy   Participation Level:  Active  Participation Quality:  Attentive and Sharing  Affect:  Blunted and Tearful  Cognitive:  Oriented  Insight:  Engaged  Engagement in Therapy:  Engaged  Modes of Intervention:   Activity, Exploration  Ambrose Mantle, LCSW 01/16/2015, 12:30pm

## 2015-01-16 NOTE — Progress Notes (Signed)
Psychoeducational Group Note  Date:  01/16/2015 Time: 1015 Group Topic/Focus:  Making Healthy Choices:   The focus of this group is to help patients identify negative/unhealthy choices they were using prior to admission and identify positive/healthier coping strategies to replace them upon discharge.  Participation Level:  Active  Participation Quality:  Appropriate  Affect:  Appropriate  Cognitive:  Appropriate  Insight:  Engaged  Engagement in Group:  Engaged  Additional Comments:    Rich Brave 3:25 PM. 01/16/2015

## 2015-01-16 NOTE — Progress Notes (Signed)
Beth [name] cont to struggle with her depression and her high anxiety....coupled with her migraine headaches and her imminent vomiting the ( she says) is a status quo EVERY TIME she eats.   A She remains sad, dperessed and overwhelmed. She avoids eye contact. She attends her grouops, is engaged in her recovery but strggles today to find a balance. She completes her daily assessment and on it she writes she deneis SI and she rates her depression, hopelessnesss and anxiety " 6/4/9" respectively.   R POC cont.

## 2015-01-16 NOTE — Progress Notes (Signed)
D: Patient complains of tooth ache. This Clinical research associate notices left swollen(+) cheek. Patient currently on Amoxicillin 500 mg and Orajel 10%      prn for mouth pain. Denies SI/AH/VH at this time.  A: Patient reassured. Due medications given as ordered. Safety maintained at all times. Will continue to monitor patient. R: Patient receptive to nursing intervention.

## 2015-01-16 NOTE — Progress Notes (Signed)
Patient ID: ARDITH TEST, female   DOB: 1981/07/12, 34 y.o.   MRN: [id number] Patient ID: NAJIYAH PARIS, female   DOB: 1981/02/17, 34 y.o.   MRN: [id number] [facility] MD Progress Note  01/16/2015 11:42 AM ENIYA CANNADY  MRN:  [id number] Subjective: Patient states " I still feel anxious.  I had a very bad nightmare and it was vivid.  My ex husband brains , I had it in my hand, he was dead."   and my tooth and side (from a fall) is still hurting."  Objective:  Patient seen and chart reviewed.Patient discussed with treatment team. Pt today appears to be depressed ,anxious as well as being in pain. Patient had a fall prior to coming, has lacerations on her left forearm, xray done in ED (HAND) is negative. Offered supportive care like ice pack as well as topical medication for pain. Pt also with a headache, reports taking imitrex in the past. Patient also with tooth ache, will provide supportive care for the same. Discussed starting medication for depression as well as anxiety. She was not very receptive yesterday, but today reports wanting to be on it. Pt with past hx of being on SSRI and SNRI ,reports that they make her feel awful. Hence discussed starting a TCA.   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 Alcohol use disorder, moderate, dependence [F10.20] 01/13/2015  . Free intraperitoneal air [K66.8] 08/18/2014  . Perforated ulcer [K27.5] 08/18/2014  . 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 Acid reflux [K21.9]   . History of blood transfusion [Z92.89]    . Multiple sclerosis [G35] 03/13/2012  . H/O gastric bypass [Z98.84] 03/13/2012   Total Time spent with patient: 30 minutes   Past Medical History:  Past Medical History  Diagnosis Date  . Acid reflux   . Asthma   . History of blood transfusion   . Multiple sclerosis 2005    diagnosed in [place], failed steroids, no other medication  . Anxiety   . Gastric ulcer   . Migraine   . Chronic nausea     self reported  . Chronic diarrhea     self reported  . Chronic pain     self reported chronic pain, back pain, fibromyalgia  . Insomnia   . Polysubstance abuse   . ETOH abuse   . Delirium tremens     Past Surgical History  Procedure Laterality Date  . Gastric bypass    . Diagnostic laparoscopy  2003    repair of perforation at [facility] anastamosis   . Lumbar laminectomy/decompression microdiscectomy Right 04/14/2014    Procedure: LUMBAR LAMINECTOMY/DECOMPRESSION MICRODISCECTOMY LEVEL L4-5;  Surgeon: Maeola Harman, MD;  Location: [facility];  Service: Neurosurgery;  Laterality: Right;  LUMBAR LAMINECTOMY/DECOMPRESSION MICRODISCECTOMY LEVEL L4-5  . Laparotomy N/A 08/18/2014    Procedure: EXPLORATORY LAPAROTOMY, Patch graft of gastric perforation;  Surgeon: Abigail Miyamoto, MD;  Location: [facility] OR;  Service: General;  Laterality: N/A;   Family History:  Family History  Problem Relation Age of Onset  . Hypertension Paternal Grandfather   . Colon cancer Paternal Grandfather 70  . Asthma Paternal  Grandmother   . Hypertension Paternal Grandmother   . Breast cancer Paternal Grandmother   . Cancer Paternal Grandmother     lung   . Diabetes Maternal Grandmother   . Diabetes Maternal Grandfather   . Hypertension Father   . Urinary tract infection Mother   . Stroke Mother 56    3 strokes    Social History:  History  Alcohol Use  . 195.6 oz/week  . 25 Cans of beer, 301 Shots of liquor per week    Comment: Says she can drink up to a 1/2 gallon/day     History  Drug Use No     Comment: cocaine-none >1 year.  meth-none >10 years.    History   Social History  . Marital Status: Married    Spouse Name: N/A  . Number of Children: N/A  . Years of Education: N/A   Social History Main Topics  . Smoking status: Current Some Day Smoker -- 0.10 packs/day    Types: Cigarettes  . Smokeless tobacco: Never Used  . Alcohol Use: 195.6 oz/week    25 Cans of beer, 301 Shots of liquor per week     Comment: Says she can drink up to a 1/2 gallon/day  . Drug Use: No     Comment: cocaine-none >1 year.  meth-none >10 years.  . Sexual Activity: Yes    Birth Control/ Protection: None   Other Topics Concern  . None   Social History Narrative   Additional History:    Sleep: Fair  Appetite:  Poor    Musculoskeletal: Strength & Muscle Tone: within normal limits Gait & Station: normal Patient leans: N/A   Psychiatric Specialty Exam: Physical Exam  Vitals reviewed.   ROS  Blood pressure 119/70, pulse 141, temperature 97.5 F (36.4 C), temperature source Oral, resp. rate 20, height  (1.676 m), weight 104.101 kg (229 lb 8 oz), last menstrual period 12/14/2014.Body mass index is 37.06 kg/(m^2).  General Appearance: Fairly Groomed  Patent attorney::  Fair  Speech:  Clear and Coherent  Volume:  Decreased  Mood:  Anxious and Depressed  Affect:  Tearful  Thought Process:  Coherent  Orientation:  Full (Time, Place, and Person)  Thought Content:  Rumination  Suicidal Thoughts:  No  Homicidal Thoughts:  No  Memory:  Immediate;   Fair Recent;   Fair Remote;   Fair  Judgement:  Impaired  Insight:  Lacking  Psychomotor Activity:  Restlessness  Concentration:  Fair  Recall:  [employer] of Knowledge:Fair  Language: Fair  Akathisia:  No  Handed:  Right  AIMS (if indicated):     Assets:  Communication Skills Desire for Improvement  ADL's:  Intact  Cognition: WNL  Sleep:  Number of Hours: 6     Current Medications: Current Facility-Administered Medications   Medication Dose Route Frequency Provider Last Rate Last Dose  . acetaminophen (TYLENOL) tablet 650 mg  650 mg Oral Q6H PRN Kerry Hough, PA-C   650 mg at 01/16/15 1113  . alum & mag hydroxide-simeth (MAALOX/MYLANTA) 200-200-20 MG/5ML suspension 30 mL  30 mL Oral Q4H PRN Kerry Hough, PA-C   30 mL at 01/15/15 2306  . amoxicillin (AMOXIL) capsule 500 mg  500 mg Oral 3 times per day Jomarie Longs, MD   500 mg at 01/16/15 1610  . benzocaine (ORAJEL) 10 % mucosal gel   Mouth/Throat QID PRN Jomarie Longs, MD      . busPIRone (BUSPAR) tablet 5 mg  5 mg Oral BID Adonis Brook, NP      . diclofenac sodium (VOLTAREN) 1 % transdermal gel 4 g  4 g Topical QID Jomarie Longs, MD   4 g at 01/16/15 1112  . gabapentin (NEURONTIN) capsule 200 mg  200 mg Oral TID Jomarie Longs, MD   200 mg at 01/16/15 1113  . hydrOXYzine (ATARAX/VISTARIL) tablet 25 mg  25 mg Oral TID PRN Adonis Brook, NP      . lidocaine (LIDODERM) 5 % 1 patch  1 patch Transdermal Q24H Adonis Brook, NP   1 patch at 01/15/15 1457  . magnesium hydroxide (MILK OF MAGNESIA) suspension 30 mL  30 mL Oral Daily PRN Kerry Hough, PA-C      . metaxalone [facility]) tablet 800 mg  800 mg Oral TID Adonis Brook, NP   800 mg at 01/16/15 1113  . multivitamin with minerals tablet 1 tablet  1 tablet Oral Daily Kerry Hough, PA-C   1 tablet at 01/16/15 0827  . neomycin-bacitracin-polymyxin (NEOSPORIN) ointment   Topical BID Shuvon B Rankin, NP      . nicotine (NICODERM CQ - dosed in mg/24 hours) patch 21 mg  21 mg Transdermal Daily Rachael Fee, MD   21 mg at 01/16/15 0834  . nortriptyline (PAMELOR) capsule 25 mg  25 mg Oral QHS Jomarie Longs, MD   25 mg at 01/15/15 2121  . prazosin (MINIPRESS) capsule 1 mg  1 mg Oral QHS Adonis Brook, NP      . SUMAtriptan (IMITREX) tablet 50 mg  50 mg Oral Q2H PRN Jomarie Longs, MD   50 mg at 01/16/15 0829  . thiamine (B-1) injection 100 mg  100 mg Intramuscular Once Kerry Hough, PA-C   100 mg at  01/13/15 0316  . thiamine (VITAMIN B-1) tablet 100 mg  100 mg Oral Daily Kerry Hough, PA-C   100 mg at 01/16/15 0827  . traMADol (ULTRAM) tablet 50 mg  50 mg Oral Once Adonis Brook, NP      . traZODone (DESYREL) tablet 150 mg  150 mg Oral QHS PRN Jomarie Longs, MD   150 mg at 01/15/15 2306    Lab Results:  No results found for this or any previous visit (from the past 48 hour(s)).  Physical Findings: AIMS: Facial and Oral Movements Muscles of Facial Expression: None, normal Lips and Perioral Area: None, normal Jaw: None, normal Tongue: None, normal,Extremity Movements Upper (arms, wrists, hands, fingers): None, normal Lower (legs, knees, ankles, toes): None, normal, Trunk Movements Neck, shoulders, hips: None, normal, Overall Severity Severity of abnormal movements (highest score from questions above): None, normal Incapacitation due to abnormal movements: None, normal Patient's awareness of abnormal movements (rate only patient's report): No Awareness, Dental Status Current problems with teeth and/or dentures?: Yes ("holes in teeth') Does patient usually wear dentures?: No  CIWA:  CIWA-Ar Total: 1 COWS:     Assessment: Patient is a 4 y old CF who presented after relapsing on alcohol , continues to be withdrawing , has extreme anxiety sx as well as depression. Will add an antidepressant . Will continue to manage.    Treatment Plan Summary: Daily contact with patient to assess and evaluate symptoms and progress in treatment and Medication management Completed CIWA/librium protocol. Ultram 50 mg once  For tooth pain. Buspar 5 mg BID for anxiety Increased 50 mg Vistaril for anxiety sx, Trazodone for sleep. Skelaxin increased to 800 mg for persistent musculoskeletal pain Will start Pamelor 25 mg  po qhs , to be increased if needed during the weekend. Will get baseline EKG since we are starting a TCA. Pt reports not wanting to be on an SSRI/SNRI. Reports extreme anxiety  sx. Will add Gabapentin 200 mg po tid. Will provide orajel for tooth ache , empirical treatment with amoxicillin 500 mg po tid. Provide imitrex for headache as needed. Provide ice pack for arm/forearm pain as well as diclofenac topical Rx.  Added Lidoderm patch  Will continue to monitor vitals ,medication compliance and treatment side effects while patient is here.  Will monitor for medical issues as well as call consult as needed.  Reviewed labs ,TSH - wnl. CSW will start working on disposition.  Patient to participate in therapeutic milieu   Medical Decision Making:  Review of Psycho-Social Stressors (1), Review or order clinical lab tests (1), Established Problem, Worsening (2), Review of Last Therapy Session (1), Review of Medication Regimen & Side Effects (2) and Review of New Medication or Change in Dosage (2)  Kimisha Eunice MAY, AGNP-BC 01/16/2015, 11:42 AM

## 2015-01-17 MED ORDER — GABAPENTIN 100 MG PO CAPS
200.0000 mg | ORAL_CAPSULE | Freq: Four times a day (QID) | ORAL | Status: DC
Start: 2015-01-17 — End: 2015-01-19
  Administered 2015-01-17 – 2015-01-19 (×9): 200 mg via ORAL
  Filled 2015-01-17 (×16): qty 2

## 2015-01-17 MED ORDER — ACETAMINOPHEN 500 MG PO TABS
1000.0000 mg | ORAL_TABLET | Freq: Four times a day (QID) | ORAL | Status: DC | PRN
  Administered 2015-01-17 – 2015-01-20 (×6): 1000 mg via ORAL
  Filled 2015-01-17 (×7): qty 2

## 2015-01-17 MED ORDER — TRAMADOL HCL 50 MG PO TABS
50.0000 mg | ORAL_TABLET | Freq: Once | ORAL | Status: AC
  Administered 2015-01-18: 50 mg via ORAL
  Filled 2015-01-17: qty 1

## 2015-01-17 MED ORDER — NORTRIPTYLINE HCL 25 MG PO CAPS
50.0000 mg | ORAL_CAPSULE | Freq: Every day | ORAL | Status: DC
Start: 2015-01-17 — End: 2015-01-19
  Administered 2015-01-17 – 2015-01-18 (×2): 50 mg via ORAL
  Filled 2015-01-17 (×5): qty 2

## 2015-01-17 NOTE — Progress Notes (Signed)
Recreation Therapy Notes  Date: 03.21.2016 Time: 9:30am Location: [address] Group Room   Group Topic: Stress Management  Goal Area(s) Addresses:  Patient will actively participate in stress management techniques presented during session.   Behavioral Response: Did not attend.   Marykay Lex Forrester Blando, LRT/CTRS  Daniele Yankowski L 01/17/2015 10:03 AM

## 2015-01-17 NOTE — [facility] Group Notes (Signed)
[facility] LCSW Group Therapy 01/17/2015  1:15 PM   Type of Therapy: Group Therapy  Participation Level: Did Not Attend. Patient invited to participate but declined.    Sabrina Keough, MSW, LCSWA Clinical Social Worker [facility] [phone]   

## 2015-01-17 NOTE — Progress Notes (Signed)
Patient ID: KASARA SCHOMER, female   DOB: 29-Oct-1981, 34 y.o.   MRN: [id number] Patient ID: SUMIKO CEASAR, female   DOB: 05-18-1981, 34 y.o.   MRN: [id number] [facility] MD Progress Note  01/17/2015 3:50 PM TYESE FINKEN  MRN:  [id number] Subjective: Patient reports this morning started out okay, and then she was not ready to talk about grief in group therapy. Pt became anxious about this topic in group, and had to walk out. Pt lost her sister 2.5 years ago. Pt reports drinking alcohol to self-medicate her anxiety. Poor sleep, and has bad dreams (violent dream about shooting her husband in the head, and rubbing his brains all over his body). Poor appetite, and vomiting often (due to gastric bypass). Tolerating meds well. Still has a toothache. Pt denies current SI/HI/AVH.  Objective:  Patient seen and chart reviewed.Patient discussed with treatment team. Pt today appears to be depressed ,anxious as well as being in pain. Patient had a fall prior to coming, has lacerations on her left forearm, xray done in ED (HAND) is negative. Offered supportive care like ice pack as well as topical medication for pain. Pt also with a headache, reports taking imitrex in the past. Patient also with tooth ache, will provide supportive care for the same. Discussed starting medication for depression as well as anxiety. She was not very receptive yesterday, but today reports wanting to be on it. Pt with past hx of being on SSRI and SNRI ,reports that they make her feel awful. Hence discussed starting a TCA.   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 Alcohol use disorder, moderate, dependence [F10.20] 01/13/2015  . Free intraperitoneal air [K66.8] 08/18/2014  . Perforated ulcer [K27.5] 08/18/2014  . 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 Acid reflux [K21.9]   . History of blood transfusion [Z92.89]   . Multiple sclerosis [G35] 03/13/2012  . H/O gastric bypass [Z98.84] 03/13/2012   Total Time spent with patient: 30 minutes   Past Medical History:  Past Medical History  Diagnosis Date  . Acid reflux   . Asthma   . History of blood transfusion   . Multiple sclerosis 2005    diagnosed in [place], failed steroids, no other medication  . Anxiety   . Gastric ulcer   . Migraine   . Chronic nausea     self reported  . Chronic diarrhea     self reported  . Chronic pain     self reported chronic pain, back pain, fibromyalgia  . Insomnia   . Polysubstance abuse   . ETOH abuse   . Delirium tremens     Past Surgical History  Procedure Laterality Date  . Gastric bypass    . Diagnostic laparoscopy  2003    repair of perforation at [facility] anastamosis   . Lumbar laminectomy/decompression microdiscectomy Right 04/14/2014    Procedure: LUMBAR LAMINECTOMY/DECOMPRESSION MICRODISCECTOMY LEVEL L4-5;  Surgeon: Maeola Harman, MD;  Location: [facility];  Service: Neurosurgery;  Laterality: Right;  LUMBAR LAMINECTOMY/DECOMPRESSION MICRODISCECTOMY LEVEL L4-5  . Laparotomy N/A 08/18/2014    Procedure: EXPLORATORY LAPAROTOMY, Patch graft of gastric perforation;  Surgeon: Abigail Miyamoto, MD;  Location: [facility];  Service: General;  Laterality: N/A;   Family History:  Family History  Problem Relation Age of Onset  . Hypertension Paternal Grandfather   . Colon cancer Paternal Grandfather 60  . Asthma Paternal Grandmother   . Hypertension Paternal Grandmother   . Breast cancer Paternal Grandmother   . Cancer Paternal Grandmother     lung   . Diabetes Maternal Grandmother   .  Diabetes Maternal Grandfather   . Hypertension Father   . Urinary tract infection Mother   . Stroke Mother 69    3 strokes    Social History:  History  Alcohol Use  . 195.6 oz/week  . 25 Cans of beer, 301 Shots of liquor per week    Comment: Says she can drink up to a 1/2 gallon/day     History  Drug Use No    Comment: cocaine-none >1 year.  meth-none >10 years.    History   Social History  . Marital Status: Married    Spouse Name: N/A  . Number of Children: N/A  . Years of Education: N/A   Social History Main Topics  . Smoking status: Current Some Day Smoker -- 0.10 packs/day    Types: Cigarettes  . Smokeless tobacco: Never Used  . Alcohol Use: 195.6 oz/week    25 Cans of beer, 301 Shots of liquor per week     Comment: Says she can drink up to a 1/2 gallon/day  . Drug Use: No     Comment: cocaine-none >1 year.  meth-none >10 years.  . Sexual Activity: Yes    Birth Control/ Protection: None   Other Topics Concern  . None   Social History Narrative   Additional History:    Sleep: Fair  Appetite:  Poor    Musculoskeletal: Strength & Muscle Tone: within normal limits Gait & Station: normal Patient leans: N/A   Psychiatric Specialty Exam: Physical Exam  Vitals reviewed.   ROS  Blood pressure 117/83, pulse 93, temperature 97.5 F (36.4 C), temperature source Oral, resp. rate 20, height  (1.676 m), weight 104.101 kg (229 lb 8 oz), last menstrual period 12/14/2014.Body mass index is 37.06 kg/(m^2).  General Appearance: Fairly Groomed  Patent attorney::  Fair  Speech:  Clear and Coherent  Volume:  Decreased  Mood:  Anxious and Depressed  Affect:  Tearful  Thought Process:  Coherent  Orientation:  Full (Time, Place, and Person)  Thought Content:  Rumination  Suicidal Thoughts:  No  Homicidal Thoughts:  No  Memory:  Immediate;   Fair Recent;   Fair Remote;   Fair  Judgement:  Impaired  Insight:  Lacking  Psychomotor Activity:  Restlessness   Concentration:  Fair  Recall:  [employer] of Knowledge:Fair  Language: Fair  Akathisia:  No  Handed:  Right  AIMS (if indicated):     Assets:  Communication Skills Desire for Improvement  ADL's:  Intact  Cognition: WNL  Sleep:  Number of Hours: 5.5     Current Medications: Current Facility-Administered Medications  Medication Dose Route Frequency Provider Last Rate Last Dose  . acetaminophen (TYLENOL) tablet 1,000 mg  1,000 mg Oral Q6H PRN Sanjuana Kava, NP   1,000 mg at 01/17/15 1452  . alum & mag hydroxide-simeth (MAALOX/MYLANTA) 200-200-20 MG/5ML suspension 30 mL  30 mL Oral Q4H PRN Kerry Hough, PA-C   30 mL at 01/17/15 1453  . amoxicillin (AMOXIL) capsule 500 mg  500  mg Oral 3 times per day Jomarie Longs, MD   500 mg at 01/17/15 1329  . benzocaine (ORAJEL) 10 % mucosal gel   Mouth/Throat QID PRN Jomarie Longs, MD      . busPIRone (BUSPAR) tablet 5 mg  5 mg Oral BID Adonis Brook, NP   5 mg at 01/17/15 0825  . diclofenac sodium (VOLTAREN) 1 % transdermal gel 4 g  4 g Topical QID Jomarie Longs, MD   4 g at 01/17/15 1158  . gabapentin (NEURONTIN) capsule 200 mg  200 mg Oral QID Sanjuana Kava, NP   200 mg at 01/17/15 1158  . hydrOXYzine (ATARAX/VISTARIL) tablet 25 mg  25 mg Oral TID PRN Adonis Brook, NP   25 mg at 01/17/15 0825  . lidocaine (LIDODERM) 5 % 1 patch  1 patch Transdermal Q24H Adonis Brook, NP   1 patch at 01/17/15 1330  . magnesium hydroxide (MILK OF MAGNESIA) suspension 30 mL  30 mL Oral Daily PRN Kerry Hough, PA-C      . metaxalone [facility]) tablet 800 mg  800 mg Oral TID Adonis Brook, NP   800 mg at 01/17/15 1158  . multivitamin with minerals tablet 1 tablet  1 tablet Oral Daily Kerry Hough, PA-C   1 tablet at 01/17/15 0825  . neomycin-bacitracin-polymyxin (NEOSPORIN) ointment   Topical BID Shuvon B Rankin, NP      . nicotine (NICODERM CQ - dosed in mg/24 hours) patch 21 mg  21 mg Transdermal Daily Rachael Fee, MD   21 mg at 01/17/15 9211  .  nortriptyline (PAMELOR) capsule 25 mg  25 mg Oral QHS Jomarie Longs, MD   25 mg at 01/16/15 2130  . SUMAtriptan (IMITREX) tablet 50 mg  50 mg Oral Q2H PRN Jomarie Longs, MD   50 mg at 01/17/15 0829  . thiamine (B-1) injection 100 mg  100 mg Intramuscular Once Kerry Hough, PA-C   100 mg at 01/13/15 0316  . thiamine (VITAMIN B-1) tablet 100 mg  100 mg Oral Daily Kerry Hough, PA-C   100 mg at 01/17/15 0825  . traZODone (DESYREL) tablet 150 mg  150 mg Oral QHS PRN Jomarie Longs, MD   150 mg at 01/16/15 2311    Lab Results:  No results found for this or any previous visit (from the past 48 hour(s)).  Physical Findings: AIMS: Facial and Oral Movements Muscles of Facial Expression: None, normal Lips and Perioral Area: None, normal Jaw: None, normal Tongue: None, normal,Extremity Movements Upper (arms, wrists, hands, fingers): None, normal Lower (legs, knees, ankles, toes): None, normal, Trunk Movements Neck, shoulders, hips: None, normal, Overall Severity Severity of abnormal movements (highest score from questions above): None, normal Incapacitation due to abnormal movements: None, normal Patient's awareness of abnormal movements (rate only patient's report): No Awareness, Dental Status Current problems with teeth and/or dentures?: Yes ("holes in teeth') Does patient usually wear dentures?: No  CIWA:  CIWA-Ar Total: 2 COWS:     Assessment: Patient is a 35 y old CF who presented after relapsing on alcohol , continues to be withdrawing , has extreme anxiety sx as well as depression. Will add an antidepressant . Will continue to manage.    Treatment Plan Summary: Daily contact with patient to assess and evaluate symptoms and progress in treatment and Medication management Completed CIWA/librium protocol. Buspar 5 mg BID for anxiety Cont 50 mg Vistaril for anxiety sx, Trazodone for sleep. Skelaxin 800 mg for persistent musculoskeletal pain Increase Pamelor  to 50 mg po qhs,  targeting depression/anxiety symptoms. Will get baseline EKG since we are starting a TCA. Pt reports not wanting to be on an SSRI/SNRI. Reports extreme anxiety sx. Continue Gabapentin 200 mg po tid. Will provide orajel for tooth ache , empirical treatment with amoxicillin 500 mg po tid. Provide imitrex for headache as needed. Provide ice pack for arm/forearm pain as well as diclofenac topical Rx.  Added Lidoderm patch  Will continue to monitor vitals ,medication compliance and treatment side effects while patient is here.  Will monitor for medical issues as well as call consult as needed.  Reviewed labs ,TSH - wnl. CSW will start working on disposition.  Patient to participate in therapeutic milieu   Medical Decision Making:  Review of Psycho-Social Stressors (1), Review or order clinical lab tests (1), Established Problem, Worsening (2), Review of Last Therapy Session (1), Review of Medication Regimen & Side Effects (2) and Review of New Medication or Change in Dosage (2)  Ancil Linsey MD  01/17/2015, 3:50 PM

## 2015-01-17 NOTE — Progress Notes (Signed)
D: Patient has anxious affect and mood. She reported on the self inventory sheet that both her sleep and ability to concentrate are both good, appetite is fair, normal energy level. Patient rates depression "2", feelings of hopelessness "1" and anxiety "6". Earlier the patient was very tearful in regards to an upsetting group discussion about grief; she voiced that she still mourns; pt. loss her sister in the past. Adheres to the current medication regimen.  A: Support and encouragement provided to patient. Administered medications per ordering MD. Monitor Q15 minute checks for safety.  R: Patient receptive. Denies SI/HI/AVH. Patient remains safe on the unit.

## 2015-01-17 NOTE — Progress Notes (Signed)
D: Patient requested a list of all her medications. Patient stated "Dr. told me that I can have my anxiety pill whenever I want it. I will like to have the list of medications, the dose and the time to get it". Denies AH/VH. Rated anxiety 8/10, depression 6/10. No behavioral issues noted. A: List of medications provided to patient. Support and education offered. Encouraged to verbalize needs to staff. Due medications given as ordered. Every 15 minutes check for safety maintained at all times. Will continue to monitor patient. R: Patient receptive and appropriate.

## 2015-01-17 NOTE — [facility] Group Notes (Signed)
[facility] LCSW Aftercare Discharge Planning Group Note   01/17/2015 9:53 AM  Participation Quality:  Appropriate   Mood/Affect:  Anxious  Depression Rating:  2-3  Anxiety Rating:  9-10  Thoughts of Suicide:  No Will you contract for safety?   NA  Current AVH:  No  Plan for Discharge/Comments:  "I want to go but not sure that I'm ready." Pt reports anxiety is high and she wants meds to be working better before she d/ces. She reports some cravings. She reports good sleep. She will return home with her mother at d/c and plans to get set up with [facility] at [facility] o/p. CSW assessing.   Transportation Means: mother   Supports: mother   Smart, Lebron Quam

## 2015-01-18 NOTE — Progress Notes (Signed)
D: Patient denies SI/HI and A/V hallucinations; patient reports sleep is good; reports appetite is fair; reports energy level is low; reports ability to concentrate is poor; rates depression as 8/10; rates hopelessness 5/10; rates anxiety as 10/10; patient reports to the writer about a conversation she had with her mother and was upset and tearful; patient was reporting anxiety  A: Monitored q 15 minutes; patient encouraged to attend groups; patient educated about medications; patient given medications per physician orders; patient encouraged to express feelings and/or concerns  R: Patient has been very anxious and upset and labile about conversation with mother and discharge plans and tratful today; patient was given some redirection and some coping skills to practice; patient has reported some relief and stated " this is what I like to see, happy Beth [name]"; patient's interaction with staff and peers has been appropriate; patient was able to set goal to talk with staff 1:1 when having feelings of SI; patient is taking medications as prescribed and tolerating medications; patient is attending all groups

## 2015-01-18 NOTE — Progress Notes (Signed)
Patient ID: Beth [name], female   DOB: [birth year], 34 y.o.   MRN: [id number] Patient ID: Beth [name], female   DOB: June 07, 1981, 34 y.o.   MRN: [id number] Patient ID: Beth [name], female   DOB: 01/26/1981, 34 y.o.   MRN: [id number] [facility] MD Progress Note  01/18/2015 5:34 PM DARYLE BOYINGTON  MRN:  [id number]  Subjective: Sephora says she is having a rough day because of the discussion that she had with her mother this morning. Says her mother is insensitive about her feelings and would not allow her to make her own decision. She says she is waiting to see if she will get into the [facility] to continue substance abuse treatment, and if not [facility], she then hopes to get into the [facility] residential. She continue to endorse multiple symptoms including generalized pain episodes. She currently denies any SIHI, AVH, delusional thoughts & paranoia. She is active in group.  Objective:  Patient seen and chart reviewed. Patient discussed with treatment team. Pt today appears to be depressed ,anxious as well as being in pain. Patient had a fall prior to coming, has lacerations on her left forearm, xray done in ED (HAND) is negative. Offered supportive care like ice pack as well as topical medication for pain. Pt also with a headache, reports taking imitrex in the past. Patient also with tooth ache, will provide supportive care for the same including orajel application. She remains on medication for depression as well as anxiety.  Pt with past hx of being on SSRI and SNRI, reports that they make her feel awful. Hence discussed starting a TCA.   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 Alcohol-induced  anxiety disorder with moderate or severe use disorder with onset during intoxication [F10.980] 01/13/2015  . Alcohol use disorder, moderate, dependence [F10.20] 01/13/2015  . Free intraperitoneal air [K66.8] 08/18/2014  . Perforated ulcer [K27.5] 08/18/2014  . 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 Acid reflux [K21.9]   . History of blood transfusion [Z92.89]   . Multiple sclerosis [G35] 03/13/2012  . H/O gastric bypass [Z98.84] 03/13/2012   Total Time spent with patient: 30 minutes   Past Medical History:  Past Medical History  Diagnosis Date  . Acid reflux   . Asthma   . History of blood transfusion   . Multiple sclerosis 2005    diagnosed in [place], failed steroids, no other medication  . Anxiety   . Gastric ulcer   . Migraine   . Chronic nausea     self reported  . Chronic diarrhea     self reported  . Chronic pain     self reported chronic pain, back pain, fibromyalgia  . Insomnia   . Polysubstance abuse   . ETOH abuse   . Delirium tremens     Past Surgical History  Procedure Laterality Date  . Gastric bypass    . Diagnostic laparoscopy  2003    repair of perforation at [facility] anastamosis   . Lumbar laminectomy/decompression microdiscectomy Right 04/14/2014    Procedure: LUMBAR LAMINECTOMY/DECOMPRESSION MICRODISCECTOMY LEVEL L4-5;  Surgeon: Maeola Harman, MD;  Location: [facility];  Service: Neurosurgery;  Laterality: Right;  LUMBAR LAMINECTOMY/DECOMPRESSION MICRODISCECTOMY LEVEL L4-5  . Laparotomy N/A 08/18/2014  Procedure: EXPLORATORY LAPAROTOMY, Patch graft of gastric perforation;  Surgeon: Abigail Miyamoto, MD;  Location: [facility] OR;  Service: General;  Laterality: N/A;   Family History:  Family History  Problem Relation Age of Onset  . Hypertension Paternal Grandfather   . Colon cancer Paternal Grandfather 35  . Asthma Paternal Grandmother   . Hypertension Paternal Grandmother   . Breast cancer Paternal Grandmother   . Cancer Paternal  Grandmother     lung   . Diabetes Maternal Grandmother   . Diabetes Maternal Grandfather   . Hypertension Father   . Urinary tract infection Mother   . Stroke Mother 54    3 strokes    Social History:  History  Alcohol Use  . 195.6 oz/week  . 25 Cans of beer, 301 Shots of liquor per week    Comment: Says she can drink up to a 1/2 gallon/day     History  Drug Use No    Comment: cocaine-none >1 year.  meth-none >10 years.    History   Social History  . Marital Status: Married    Spouse Name: N/A  . Number of Children: N/A  . Years of Education: N/A   Social History Main Topics  . Smoking status: Current Some Day Smoker -- 0.10 packs/day    Types: Cigarettes  . Smokeless tobacco: Never Used  . Alcohol Use: 195.6 oz/week    25 Cans of beer, 301 Shots of liquor per week     Comment: Says she can drink up to a 1/2 gallon/day  . Drug Use: No     Comment: cocaine-none >1 year.  meth-none >10 years.  . Sexual Activity: Yes    Birth Control/ Protection: None   Other Topics Concern  . None   Social History Narrative   Additional History:    Sleep: Fair  Appetite:  Poor  Musculoskeletal: Strength & Muscle Tone: within normal limits Gait & Station: normal Patient leans: N/A  Psychiatric Specialty Exam: Physical Exam  Vitals reviewed.   ROS  Blood pressure 108/70, pulse 121, temperature 97.5 F (36.4 C), temperature source Oral, resp. rate 20, height  (1.676 m), weight 104.101 kg (229 lb 8 oz), last menstrual period 12/14/2014.Body mass index is 37.06 kg/(m^2).  General Appearance: Fairly Groomed  Patent attorney::  Fair  Speech:  Clear and Coherent  Volume:  Decreased  Mood:  Anxious and Depressed  Affect:  Tearful  Thought Process:  Coherent  Orientation:  Full (Time, Place, and Person)  Thought Content:  Rumination  Suicidal Thoughts:  No  Homicidal Thoughts:  No  Memory:  Immediate;   Fair Recent;   Fair Remote;   Fair  Judgement:  Impaired   Insight:  Lacking  Psychomotor Activity:  Restlessness  Concentration:  Fair  Recall:  [employer] of Knowledge:Fair  Language: Fair  Akathisia:  No  Handed:  Right  AIMS (if indicated):     Assets:  Communication Skills Desire for Improvement  ADL's:  Intact  Cognition: WNL  Sleep:  Number of Hours: 5.75   Current Medications: Current Facility-Administered Medications  Medication Dose Route Frequency Provider Last Rate Last Dose  . acetaminophen (TYLENOL) tablet 1,000 mg  1,000 mg Oral Q6H PRN Sanjuana Kava, NP   1,000 mg at 01/18/15 1618  . alum & mag hydroxide-simeth (MAALOX/MYLANTA) 200-200-20 MG/5ML suspension 30 mL  30 mL Oral Q4H PRN Kerry Hough, PA-C   30 mL at 01/17/15 1453  . amoxicillin (AMOXIL) capsule  500 mg  500 mg Oral 3 times per day Jomarie Longs, MD   500 mg at 01/18/15 1311  . benzocaine (ORAJEL) 10 % mucosal gel   Mouth/Throat QID PRN Jomarie Longs, MD      . busPIRone (BUSPAR) tablet 5 mg  5 mg Oral BID Adonis Brook, NP   5 mg at 01/18/15 1618  . diclofenac sodium (VOLTAREN) 1 % transdermal gel 4 g  4 g Topical QID Jomarie Longs, MD   4 g at 01/18/15 1619  . gabapentin (NEURONTIN) capsule 200 mg  200 mg Oral QID Sanjuana Kava, NP   200 mg at 01/18/15 1617  . hydrOXYzine (ATARAX/VISTARIL) tablet 25 mg  25 mg Oral TID PRN Adonis Brook, NP   25 mg at 01/18/15 0931  . lidocaine (LIDODERM) 5 % 1 patch  1 patch Transdermal Q24H Adonis Brook, NP   1 patch at 01/18/15 1311  . magnesium hydroxide (MILK OF MAGNESIA) suspension 30 mL  30 mL Oral Daily PRN Kerry Hough, PA-C      . metaxalone [facility]) tablet 800 mg  800 mg Oral TID Adonis Brook, NP   800 mg at 01/18/15 1618  . multivitamin with minerals tablet 1 tablet  1 tablet Oral Daily Kerry Hough, PA-C   1 tablet at 01/18/15 0746  . neomycin-bacitracin-polymyxin (NEOSPORIN) ointment   Topical BID Shuvon B Rankin, NP      . nicotine (NICODERM CQ - dosed in mg/24 hours) patch 21 mg  21 mg  Transdermal Daily Rachael Fee, MD   21 mg at 01/18/15 0747  . nortriptyline (PAMELOR) capsule 50 mg  50 mg Oral QHS Caprice Kluver, MD   50 mg at 01/17/15 2142  . SUMAtriptan (IMITREX) tablet 50 mg  50 mg Oral Q2H PRN Jomarie Longs, MD   50 mg at 01/18/15 0813  . thiamine (B-1) injection 100 mg  100 mg Intramuscular Once Kerry Hough, PA-C   100 mg at 01/13/15 0316  . thiamine (VITAMIN B-1) tablet 100 mg  100 mg Oral Daily Kerry Hough, PA-C   100 mg at 01/18/15 0747  . traZODone (DESYREL) tablet 150 mg  150 mg Oral QHS PRN Jomarie Longs, MD   150 mg at 01/17/15 2304    Lab Results:  No results found for this or any previous visit (from the past 48 hour(s)).  Physical Findings: AIMS: Facial and Oral Movements Muscles of Facial Expression: None, normal Lips and Perioral Area: None, normal Jaw: None, normal Tongue: None, normal,Extremity Movements Upper (arms, wrists, hands, fingers): None, normal Lower (legs, knees, ankles, toes): None, normal, Trunk Movements Neck, shoulders, hips: None, normal, Overall Severity Severity of abnormal movements (highest score from questions above): None, normal Incapacitation due to abnormal movements: None, normal Patient's awareness of abnormal movements (rate only patient's report): No Awareness, Dental Status Current problems with teeth and/or dentures?: Yes ("holes in teeth') Does patient usually wear dentures?: No  CIWA:  CIWA-Ar Total: 2 COWS:     Assessment: Patient is a 89 y old CF who presented after relapsing on alcohol , continues to be withdrawing , has extreme anxiety sx as well as depression. Will add an antidepressant . Will continue to manage.  Treatment Plan Summary: Daily contact with patient to assess and evaluate symptoms and progress in treatment and Medication management: Continue Buspar 5 mg BID for anxiety, 50 mg Vistaril for anxiety, Trazodone 150 mg for sleep, Skelaxin 800 mg for persistent musculoskeletal pain,  Pamelor 50 mg po qhs targeting depression/anxiety symptoms.   Continue Gabapentin 200 mg po tid, Orajel for tooth ache , empirical treatment with amoxicillin 500 mg po tid. Provide imitrex for headache as needed. Provide ice pack for arm/forearm pain as well as diclofenac topical Rx.  Added Lidoderm patch Will continue to monitor vitals ,medication compliance and treatment side effects while patient is here.  Will monitor for medical issues as well as call consult as needed.  CSW will start working on disposition.  Patient to participate in therapeutic milieu   Medical Decision Making:  Review of Psycho-Social Stressors (1), Review or order clinical lab tests (1), Established Problem, Worsening (2), Review of Last Therapy Session (1), Review of Medication Regimen & Side Effects (2) and Review of New Medication or Change in Dosage (2)  Sanjuana Kava, PMHNP, FNP-BC 01/18/2015, 5:34 PM Patient seen face-to-face for psychiatric evaluation, chart reviewed and case discussed with the physician extender and developed treatment plan. Reviewed the information documented and agree with the treatment plan. Thedore Mins, MD

## 2015-01-18 NOTE — Clinical Social Work Note (Signed)
CSW met with pt individually this morning. Pt reports phone call from her mother that upset her this morning. Pt reports continued difficulty in emotional regulation and reports high anxiety and depression today. Pt hoping to speak with MD soon about medications. Pt also met with CDIOP therapist who recommended [facility] for pt. Pt agreeable to allowing CSW to send referral after reviewing information about this facility. Pt signed release and referral sent at 11:15AM on 3/22. Per Brandon Melnick, if pt is not accepted to [facility], she is open to CDIOP.  [place], LCSWA  01/18/2015 11:24 AM

## 2015-01-18 NOTE — [facility] Group Notes (Signed)
Adult Psychoeducational Group Note  Date:  01/18/2015 Time:  10:29 PM  Group Topic/Focus:  AA Meeting  Participation Level:  Minimal  Participation Quality:  Attentive  Affect:  Flat  Cognitive:  Alert  Insight: Limited  Engagement in Group:  Limited  Modes of Intervention:  Discussion and Education  Additional Comments:  Jakhia attended group.  Caroll Rancher A 01/18/2015, 10:29 PM

## 2015-01-18 NOTE — Progress Notes (Signed)
D: Pt's mood is anxious tonight. Eye contacts is fair. She states that her day was okay and rates her depression low. A: Support given. Verbalization encouraged. Pt encouraged to come to staff with any concerns. Medications given as prescribed.  R: Pt is receptive. No complaints of pain or discomfort at this time. Q15 min safety checks maintained. Will continue to monitor.

## 2015-01-18 NOTE — [facility] Group Notes (Signed)
[facility] LCSW Group Therapy  01/18/2015 1:23 PM  Type of Therapy:  Group Therapy  Participation Level:  Minimal  Participation Quality:  Attentive  Affect:  Flat  Cognitive:  Alert  Insight:  Improving  Engagement in Therapy:  Improving  Modes of Intervention:  Discussion, Education, Exploration, Problem-solving, Rapport Building, Socialization and Support  Summary of Progress/Problems: MHA Speaker came to talk about his personal journey with substance abuse and addiction. The pt processed ways by which to relate to the speaker. MHA speaker provided handouts and educational information pertaining to groups and services offered by the [facility]   [name], Beth [place]ah LCSWA 01/18/2015, 1:23 PM

## 2015-01-18 NOTE — [facility] Group Notes (Signed)
[facility] Group Notes:  (Nursing/MHT/Case Management/Adjunct)  Date:  01/18/2015  Time:  0900 am  Type of Therapy:  Psychoeducational Skills  Participation Level:  None  Participation Quality:  Inattentive  Affect:  Flat and Irritable  Cognitive:  Lacking  Insight:  Lacking  Engagement in Group:  Lacking  Modes of Intervention:  Support  Summary of Progress/Problems: Patient kept her head down during group. Irritable mood.   [place] Mon 01/18/2015, 1:37 PM
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
## 2015-01-19 MED ORDER — NORTRIPTYLINE HCL 25 MG PO CAPS
75.0000 mg | ORAL_CAPSULE | Freq: Every day | ORAL | Status: DC
Start: 2015-01-19 — End: 2015-01-22
  Administered 2015-01-19: 75 mg via ORAL
  Filled 2015-01-19: qty 3
  Filled 2015-01-19: qty 9
  Filled 2015-01-19 (×2): qty 3
  Filled 2015-01-19: qty 9
  Filled 2015-01-19: qty 3

## 2015-01-19 MED ORDER — GABAPENTIN 300 MG PO CAPS
300.0000 mg | ORAL_CAPSULE | Freq: Four times a day (QID) | ORAL | Status: DC
Start: 2015-01-19 — End: 2015-01-22
  Administered 2015-01-19 – 2015-01-20 (×4): 300 mg via ORAL
  Filled 2015-01-19: qty 12
  Filled 2015-01-19 (×3): qty 1
  Filled 2015-01-19 (×2): qty 12
  Filled 2015-01-19 (×2): qty 1
  Filled 2015-01-19: qty 12
  Filled 2015-01-19 (×4): qty 1
  Filled 2015-01-19: qty 12
  Filled 2015-01-19: qty 1
  Filled 2015-01-19: qty 12
  Filled 2015-01-19: qty 1
  Filled 2015-01-19 (×2): qty 12

## 2015-01-19 NOTE — Progress Notes (Signed)
Recreation Therapy Notes  Animal-Assisted Activity/Therapy (AAA/T) Program Checklist/Progress Notes Patient Eligibility Criteria Checklist & Daily Group note for Rec Tx Intervention  Date: 03.22.2016 Time: 2:45pm Location: [address] Dayroom   AAA/T Program Assumption of Risk Form signed by Patient/ or Parent Legal Guardian yes  Patient is free of allergies or sever asthma yes  Patient reports no fear of animals yes  Patient reports no history of cruelty to animals yes  Patient understands his/her participation is voluntary yes  Behavioral Response: Did not attend.   Beth [name], LRT/CTRS  Beth [name] L 01/19/2015 8:18 AM 

## 2015-01-19 NOTE — Clinical Social Work Note (Signed)
CSW informed pt that she has phone interview with [facility] at 1:30PM. Pt currently on phone with facility. CSW reviewed concerns about pain management with pt prior to this phone screening. Pt stated that her pain is manageable and under control and that she wants to attend treatment when she leaves [facility].   [employer], LCSWA 01/19/2015 2:29 PM

## 2015-01-19 NOTE — Progress Notes (Signed)
Patient ID: Beth [name], female   DOB: 10-Sep-1981, 34 y.o.   MRN: [id number] Patient ID: Beth [name], female   DOB: 24-Aug-1981, 34 y.o.   MRN: [id number] Patient ID: Beth [name], female   DOB: [birth year], 34 y.o.   MRN: [id number] [facility] MD Progress Note  01/19/2015 4:18 PM Beth [name]  MRN:  [id number]  Subjective: Beth [name] says she is having a rough day because of verbal altercations with peers today.  Pt is considering going to [facility] tomorrow evening at 4pm. She continue to endorse multiple symptoms including generalized pain episodes. She currently denies any SI, HI, AVH, delusional thoughts & paranoia. She is active in group. Pt reports continued anxiety and depression. Pt denies withdrawal symptoms.   Objective:  Patient seen and chart reviewed. Patient discussed with treatment team. Pt today appears to be depressed ,anxious as well as being in pain. Pt also with a headache, reports taking imitrex in the past. Patient also with tooth ache, will provide supportive care for the same including orajel application. She remains on medication for depression as well as anxiety.  Pt with past hx of being on SSRI and SNRI, reports that they make her feel awful. Hence discussed starting a TCA.   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 Alcohol use disorder, moderate, dependence [F10.20] 01/13/2015  . Free intraperitoneal air [K66.8] 08/18/2014  . Perforated ulcer [K27.5] 08/18/2014  . 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 Acid reflux [K21.9]    . History of blood transfusion [Z92.89]   . Multiple sclerosis [G35] 03/13/2012  . H/O gastric bypass [Z98.84] 03/13/2012   Total Time spent with patient: 30 minutes   Past Medical History:  Past Medical History  Diagnosis Date  . Acid reflux   . Asthma   . History of blood transfusion   . Multiple sclerosis 2005    diagnosed in [place], failed steroids, no other medication  . Anxiety   . Gastric ulcer   . Migraine   . Chronic nausea     self reported  . Chronic diarrhea     self reported  . Chronic pain     self reported chronic pain, back pain, fibromyalgia  . Insomnia   . Polysubstance abuse   . ETOH abuse   . Delirium tremens     Past Surgical History  Procedure Laterality Date  . Gastric bypass    . Diagnostic laparoscopy  2003    repair of perforation at [facility] anastamosis   . Lumbar laminectomy/decompression microdiscectomy Right 04/14/2014    Procedure: LUMBAR LAMINECTOMY/DECOMPRESSION MICRODISCECTOMY LEVEL L4-5;  Surgeon: Maeola Harman, MD;  Location: [facility];  Service: Neurosurgery;  Laterality: Right;  LUMBAR LAMINECTOMY/DECOMPRESSION MICRODISCECTOMY LEVEL L4-5  . Laparotomy N/A 08/18/2014    Procedure: EXPLORATORY LAPAROTOMY, Patch graft of gastric perforation;  Surgeon: Abigail Miyamoto, MD;  Location: [facility] OR;  Service: General;  Laterality: N/A;   Family History:  Family History  Problem Relation Age of Onset  . Hypertension Paternal Grandfather   . Colon cancer Paternal Grandfather 35  . Asthma Paternal Grandmother   . Hypertension Paternal Grandmother   .  Breast cancer Paternal Grandmother   . Cancer Paternal Grandmother     lung   . Diabetes Maternal Grandmother   . Diabetes Maternal Grandfather   . Hypertension Father   . Urinary tract infection Mother   . Stroke Mother 86    3 strokes    Social History:  History  Alcohol Use  . 195.6 oz/week  . 25 Cans of beer, 301 Shots of liquor per week    Comment: Says she can drink up to a 1/2  gallon/day     History  Drug Use No    Comment: cocaine-none >1 year.  meth-none >10 years.    History   Social History  . Marital Status: Married    Spouse Name: N/A  . Number of Children: N/A  . Years of Education: N/A   Social History Main Topics  . Smoking status: Current Some Day Smoker -- 0.10 packs/day    Types: Cigarettes  . Smokeless tobacco: Never Used  . Alcohol Use: 195.6 oz/week    25 Cans of beer, 301 Shots of liquor per week     Comment: Says she can drink up to a 1/2 gallon/day  . Drug Use: No     Comment: cocaine-none >1 year.  meth-none >10 years.  . Sexual Activity: Yes    Birth Control/ Protection: None   Other Topics Concern  . None   Social History Narrative   Additional History:    Sleep: Fair  Appetite:  Fair  Musculoskeletal: Strength & Muscle Tone: within normal limits Gait & Station: normal Patient leans: N/A  Psychiatric Specialty Exam: Physical Exam  Vitals reviewed.   ROS  Blood pressure 104/70, pulse 105, temperature 97.7 F (36.5 C), temperature source Oral, resp. rate 16, height  (1.676 m), weight 104.101 kg (229 lb 8 oz), last menstrual period 12/14/2014.Body mass index is 37.06 kg/(m^2).  General Appearance: Fairly Groomed  Patent attorney::  Fair  Speech:  Clear and Coherent  Volume:  Decreased  Mood:  Anxious and Depressed  Affect:  Tearful  Thought Process:  Coherent  Orientation:  Full (Time, Place, and Person)  Thought Content:  Rumination  Suicidal Thoughts:  No  Homicidal Thoughts:  No  Memory:  Immediate;   Fair Recent;   Fair Remote;   Fair  Judgement:  Impaired  Insight:  Lacking  Psychomotor Activity:  Restlessness  Concentration:  Fair  Recall:  [employer] of Knowledge:Fair  Language: Fair  Akathisia:  No  Handed:  Right  AIMS (if indicated):     Assets:  Communication Skills Desire for Improvement  ADL's:  Intact  Cognition: WNL  Sleep:  Number of Hours: 5.5   Current Medications: Current  Facility-Administered Medications  Medication Dose Route Frequency Provider Last Rate Last Dose  . acetaminophen (TYLENOL) tablet 1,000 mg  1,000 mg Oral Q6H PRN Sanjuana Kava, NP   1,000 mg at 01/19/15 0959  . alum & mag hydroxide-simeth (MAALOX/MYLANTA) 200-200-20 MG/5ML suspension 30 mL  30 mL Oral Q4H PRN Kerry Hough, PA-C   30 mL at 01/17/15 1453  . amoxicillin (AMOXIL) capsule 500 mg  500 mg Oral 3 times per day Jomarie Longs, MD   500 mg at 01/19/15 1327  . benzocaine (ORAJEL) 10 % mucosal gel   Mouth/Throat QID PRN Jomarie Longs, MD      . busPIRone (BUSPAR) tablet 5 mg  5 mg Oral BID Adonis Brook, NP   5 mg at 01/19/15 0751  .  diclofenac sodium (VOLTAREN) 1 % transdermal gel 4 g  4 g Topical QID Jomarie Longs, MD   4 g at 01/19/15 0755  . gabapentin (NEURONTIN) capsule 200 mg  200 mg Oral QID Sanjuana Kava, NP   200 mg at 01/19/15 1138  . hydrOXYzine (ATARAX/VISTARIL) tablet 25 mg  25 mg Oral TID PRN Adonis Brook, NP   25 mg at 01/19/15 1403  . lidocaine (LIDODERM) 5 % 1 patch  1 patch Transdermal Q24H Adonis Brook, NP   1 patch at 01/19/15 1329  . magnesium hydroxide (MILK OF MAGNESIA) suspension 30 mL  30 mL Oral Daily PRN Kerry Hough, PA-C      . metaxalone [facility]) tablet 800 mg  800 mg Oral TID Adonis Brook, NP   800 mg at 01/19/15 1138  . multivitamin with minerals tablet 1 tablet  1 tablet Oral Daily Kerry Hough, PA-C   1 tablet at 01/19/15 0751  . neomycin-bacitracin-polymyxin (NEOSPORIN) ointment   Topical BID Shuvon B Rankin, NP      . nicotine (NICODERM CQ - dosed in mg/24 hours) patch 21 mg  21 mg Transdermal Daily Rachael Fee, MD   21 mg at 01/19/15 0754  . nortriptyline (PAMELOR) capsule 50 mg  50 mg Oral QHS Caprice Kluver, MD   50 mg at 01/18/15 2200  . SUMAtriptan (IMITREX) tablet 50 mg  50 mg Oral Q2H PRN Jomarie Longs, MD   50 mg at 01/19/15 0752  . thiamine (B-1) injection 100 mg  100 mg Intramuscular Once Kerry Hough, PA-C   100 mg at  01/13/15 0316  . thiamine (VITAMIN B-1) tablet 100 mg  100 mg Oral Daily Kerry Hough, PA-C   100 mg at 01/19/15 0751  . traZODone (DESYREL) tablet 150 mg  150 mg Oral QHS PRN Jomarie Longs, MD   150 mg at 01/18/15 2308    Lab Results:  No results found for this or any previous visit (from the past 48 hour(s)).  Physical Findings: AIMS: Facial and Oral Movements Muscles of Facial Expression: None, normal Lips and Perioral Area: None, normal Jaw: None, normal Tongue: None, normal,Extremity Movements Upper (arms, wrists, hands, fingers): None, normal Lower (legs, knees, ankles, toes): None, normal, Trunk Movements Neck, shoulders, hips: None, normal, Overall Severity Severity of abnormal movements (highest score from questions above): None, normal Incapacitation due to abnormal movements: None, normal Patient's awareness of abnormal movements (rate only patient's report): No Awareness, Dental Status Current problems with teeth and/or dentures?: Yes ("holes in teeth') Does patient usually wear dentures?: No  CIWA:  CIWA-Ar Total: 0 COWS:     Assessment: Patient is a 38 y old CF who presented after relapsing on alcohol , continues to be withdrawing , has extreme anxiety sx as well as depression. Will add an antidepressant . Will continue to manage.  Treatment Plan Summary: Daily contact with patient to assess and evaluate symptoms and progress in treatment and Medication management: Continue Buspar 5 mg BID for anxiety, 50 mg Vistaril for anxiety, Trazodone 150 mg for sleep, Skelaxin 800 mg for persistent musculoskeletal pain,  Increase Pamelor to 75 mg po qhs targeting depression/anxiety symptoms.   Increase Gabapentin to 300 mg po tid for anxiety, Orajel for tooth ache , empirical treatment with amoxicillin 500 mg po tid. Provide imitrex for headache as needed. Provide ice pack for arm/forearm pain as well as diclofenac topical Rx.  Added Lidoderm patch Will continue to monitor  vitals ,medication compliance  and treatment side effects while patient is here.  Will monitor for medical issues as well as call consult as needed.  CSW will start working on disposition.  Patient to participate in therapeutic milieu   Medical Decision Making:  Review of Psycho-Social Stressors (1), Review or order clinical lab tests (1), Established Problem, Worsening (2), Review of Last Therapy Session (1), Review of Medication Regimen & Side Effects (2) and Review of New Medication or Change in Dosage (2)  Ancil Linsey, MD  01/19/2015, 4:18 PM

## 2015-01-19 NOTE — Progress Notes (Signed)
D: Pt denies SI/HI/AVH. Pt is pleasant and cooperative. Pt anxious, pt concerned about going straight to [facility] day program. Pt stated if she does not go straight to another facility she would relapse.   A: Pt was offered support and encouragement. Pt was given scheduled medications. Pt was encourage to attend groups. Q 15 minute checks were done for safety.   R:Pt attends groups and interacts well with peers and staff. Pt is taking medication. Pt has no complaints at this time .Pt receptive to treatment and safety maintained on unit.

## 2015-01-19 NOTE — [facility] Group Notes (Signed)
[facility] LCSW Aftercare Discharge Planning Group Note   01/19/2015 9:51 AM  Participation Quality:  Appropriate  Mood/Affect:  Anxious  Depression Rating:  6  Anxiety Rating:  9  Thoughts of Suicide:  No Will you contract for safety?   NA  Current AVH:  No  Plan for Discharge/Comments:  Pt reports that she is hoping for direct transfer to [name]. CSW assessing-sent updated records this morning and let pt know this. Pt upset because "I have not seen a doctor in two days." Pt reports good sleep but reported that she wet the bed. Pt reports "I am angry and having trouble managing that. Another pt got me mad in the cafeteria this morning." Pt focused on smoking cigarettes.   Transportation Means: unknown at this time.   Supports: parents and boyfriend   Smart, Lebron Quam

## 2015-01-19 NOTE — [facility] Group Notes (Signed)
[facility] LCSW Group Therapy  01/19/2015 3:48 PM  Type of Therapy:  Group Therapy  Participation Level:  Active  Participation Quality:  Attentive  Affect:  Anxious  Cognitive:  Alert and Oriented  Insight:  Improving  Engagement in Therapy:  Improving  Modes of Intervention:  Confrontation, Discussion, Education, Exploration, Problem-solving, Rapport Building, Socialization and Support  Summary of Progress/Problems: Emotion Regulation: This group focused on both positive and negative emotion identification and allowed group members to process ways to identify feelings, regulate negative emotions, and find healthy ways to manage internal/external emotions. Group members were asked to reflect on a time when their reaction to an emotion led to a negative outcome and explored how alternative responses using emotion regulation would have benefited them. Group members were also asked to discuss a time when emotion regulation was utilized when a negative emotion was experienced. Beth [name] shared that she is struggling with anger over the past few days, "which is really out of character for me." She shared that alcohol helps her to numb her feelings and now that she cannot drink, she is struggling to find effective coping skills. "I'm trying to do breathing exercises and distractions like coloring, but I'm struggling." Beth [name] was open to suggestions from other group members and was able to identify triggers for her anger that she is working to avoid. Emotional support and encouragement provided to pt.    Smart, Girtha Kilgore LCSWA  01/19/2015, 3:48 PM

## 2015-01-19 NOTE — Progress Notes (Signed)
D: Patient denies SI/HI and A/V hallucinations; patient reports some anxiety about discharge plans  A: Monitored q 15 minutes; patient encouraged to attend groups; patient educated about medications; patient given medications per physician orders; patient encouraged to express feelings and/or concerns  R: Patient became focus on what could happen and how she was going to pay and started crying; patient was able to calm down after redirection;  patient's interaction with staff and peers is appropriate; patient was able to set goal to talk with staff 1:1 when having feelings of SI; patient is taking medications as prescribed and tolerating medications; patient is attending all groups

## 2015-01-19 NOTE — Clinical Social Work Note (Signed)
Pt accepted to [facility] but must pay $300 at admission. Pt stated this is doable and she will call parents tonight when they get off work to arrange transportation. Admission is scheduled for 4:00PM (verified with Chrissie Noa at [name]).  [employer], LCSWA 01/19/2015 3:48 PM

## 2015-01-19 NOTE — Progress Notes (Signed)
Recreation Therapy Notes  Date: 03.23.2016 Time: 9:30am Location: [facility] Dayroom   Group Topic: Stress Management  Goal Area(s) Addresses:  Patient will actively participate in stress management techniques presented during session.   Behavioral Response: Did not attend.   It was reported to LRT by MHT assigned to [facility] hall that patient poisoned milieu against attending stress management group, telling peers on unit group is "stupid" and "not mandatory."   [employer], LRT/CTRS  Jearl Klinefelter 01/19/2015 3:59 PM

## 2015-01-19 NOTE — Progress Notes (Signed)
Pt experienced enuresis at 245. Pt came to nursing station, anxious and worried. Pt was very embarrassed and afraid to go back to sleep. Pt stated she had the urge to go before she fell asleep but could not go, and thinks she was in too deep a sleep to wake up. Pt given encouragement and encouraged to go back to sleep. Pt bed was cleaned by the MHT on the hall, pt clothes placed in the  Washer. Pt went  Back to sleep appeared to be less anxious.

## 2015-01-20 ENCOUNTER — Encounter (HOSPITAL_COMMUNITY): Payer: Self-pay | Admitting: Registered Nurse

## 2015-01-20 MED ORDER — DICLOFENAC SODIUM 1 % TD GEL: 4.0000 g | Freq: Four times a day (QID) | TRANSDERMAL | Status: DC

## 2015-01-20 MED ORDER — HYDROXYZINE HCL 25 MG PO TABS: 25.0000 mg | ORAL_TABLET | Freq: Three times a day (TID) | ORAL | Status: DC | PRN

## 2015-01-20 MED ORDER — LIDOCAINE 5 % EX PTCH: 1.0000 | MEDICATED_PATCH | CUTANEOUS | Status: DC

## 2015-01-20 MED ORDER — BUSPIRONE HCL 5 MG PO TABS: 5.0000 mg | ORAL_TABLET | Freq: Two times a day (BID) | ORAL | Status: DC

## 2015-01-20 MED ORDER — TRAZODONE HCL 150 MG PO TABS: 150.0000 mg | ORAL_TABLET | Freq: Every evening | ORAL | Status: DC | PRN

## 2015-01-20 MED ORDER — NICOTINE POLACRILEX 2 MG MT GUM
2.0000 mg | CHEWING_GUM | OROMUCOSAL | Status: DC | PRN
  Administered 2015-01-20 (×2): 2 mg via ORAL
  Filled 2015-01-20 (×2): qty 1

## 2015-01-20 MED ORDER — NORTRIPTYLINE HCL 75 MG PO CAPS: 75.0000 mg | ORAL_CAPSULE | Freq: Every day | ORAL | Status: DC

## 2015-01-20 MED ORDER — BACITRACIN-NEOMYCIN-POLYMYXIN OINTMENT TUBE: 1.0000 "application " | TOPICAL_OINTMENT | Freq: Two times a day (BID) | CUTANEOUS | Status: DC

## 2015-01-20 MED ORDER — AMOXICILLIN 500 MG PO CAPS: 500.0000 mg | ORAL_CAPSULE | Freq: Three times a day (TID) | ORAL | Status: DC

## 2015-01-20 MED ORDER — SUMATRIPTAN SUCCINATE 50 MG PO TABS: 50.0000 mg | ORAL_TABLET | ORAL | Status: DC | PRN

## 2015-01-20 MED ORDER — GABAPENTIN 300 MG PO CAPS: 300.0000 mg | ORAL_CAPSULE | Freq: Four times a day (QID) | ORAL | Status: DC

## 2015-01-20 NOTE — [facility] Suicide Risk Assessment (Signed)
[facility] Discharge Suicide Risk Assessment   Demographic Factors:  Caucasian, Low socioeconomic status and Unemployed  Total Time spent with patient: 30 minutes  Musculoskeletal: Strength & Muscle Tone: within normal limits Gait & Station: normal Patient leans: N/A  Psychiatric Specialty Exam: Physical Exam  Psychiatric: Her speech is normal and behavior is normal. Judgment and thought content normal. Her mood appears anxious. Cognition and memory are normal.    Review of Systems  Constitutional: Negative.   HENT: Negative.   Eyes: Negative.   Respiratory: Negative.   Cardiovascular: Negative.   Gastrointestinal: Negative.   Genitourinary: Negative.   Musculoskeletal: Negative.   Skin: Negative.   Neurological: Negative.   Endo/Heme/Allergies: Negative.   Psychiatric/Behavioral: Negative.     Blood pressure 96/56, pulse 110, temperature 97.9 F (36.6 C), temperature source Oral, resp. rate 20, height 5\' 6"  (1.676 m), weight 104.101 kg (229 lb 8 oz), last menstrual period 12/14/2014.Body mass index is 37.06 kg/(m^2).  General Appearance: Casual  Eye Contact::  Good  Speech:  Clear and Coherent409  Volume:  Normal  Mood:  Anxious  Affect:  Appropriate  Thought Process:  Goal Directed  Orientation:  Full (Time, Place, and Person)  Thought Content:  Negative  Suicidal Thoughts:  No  Homicidal Thoughts:  No  Memory:  Immediate;   Good Recent;   Good Remote;   Good  Judgement:  Fair  Insight:  Fair  Psychomotor Activity:  Normal  Concentration:  Good  Recall:  Good  Fund of Knowledge:Good  Language: Good  Akathisia:  No  Handed:  Right  AIMS (if indicated):     Assets:  Communication Skills Desire for Improvement Physical Health  Sleep:  Number of Hours: 5.5  Cognition: WNL  ADL's:  Intact   Have you used any form of tobacco in the last 30 days? (Cigarettes, Smokeless Tobacco, Cigars, and/or Pipes): Yes  Has this patient used any form of tobacco in the last 30  days? (Cigarettes, Smokeless Tobacco, Cigars, and/or Pipes) Yes, A prescription for an FDA-approved tobacco cessation medication was offered at discharge and the patient refused  Mental Status Per Nursing Assessment::   On Admission:  Suicidal ideation indicated by patient  Current Mental Status by Physician: Patient denies suicidal ideations, intent or plan  Loss Factors: Financial problems/change in socioeconomic status  Historical Factors: NA  Risk Reduction Factors:   Living with another person, especially a relative and Positive social support  Continued Clinical Symptoms:  Alcohol/Substance Abuse/Dependencies  Cognitive Features That Contribute To Risk:  Closed-mindedness    Suicide Risk:  Minimal: No identifiable suicidal ideation.  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 Alcohol use disorder, moderate, dependence [F10.20] 01/13/2015  . Free intraperitoneal air [K66.8] 08/18/2014  . Perforated ulcer [K27.5] 08/18/2014  . 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 Acid reflux [K21.9]   . History of blood transfusion [Z92.89]   . Multiple sclerosis [G35] 03/13/2012  . H/O gastric bypass [Z98.84] 03/13/2012    Follow-up Information    Follow up with [facility] On 01/18/2015.   Why:  You have orientation with Charmian Muff on  this date at 2:30PM unless she reschedules with you prior to this time. Thank you!   Contact information:   [address] [place], [place] [address] Phone: [phone] Fax: [phone]       Follow up with [facility].   Why:  referral sent 3/22 at 11:15AM.    Contact information:   [address] [place], [place] [address] Phone: [phone] Fax: [phone]      Plan Of Care/Follow-up recommendations:  Activity:  as tolerated Diet:  healthy  Is patient on multiple antipsychotic therapies at discharge:  No   Has Patient had three or more failed trials of antipsychotic monotherapy by history:  No  Recommended Plan for Multiple Antipsychotic Therapies: NA    Thedore Mins, MD 01/20/2015, 9:01 AM

## 2015-01-20 NOTE — [facility] Group Notes (Signed)
[facility] Group Notes:  (Nursing/MHT/Case Management/Adjunct)  Date:  01/20/2015  Time:  0900  Type of Therapy:  Nurse Education  Participation Level:  Active  Participation Quality:  Appropriate and Attentive  Affect:  Appropriate  Cognitive:  Alert and Appropriate  Insight:  Appropriate  Engagement in Group:  Engaged  Modes of Intervention:  Discussion and Support  Summary of Progress/Problems: Patient attended group, remained engaged, and responded appropriately when prompted.  Lendell Caprice A 01/20/2015, 9:47 AM

## 2015-01-20 NOTE — Progress Notes (Signed)
Pt attended group 

## 2015-01-20 NOTE — Progress Notes (Signed)
D    [facility]   She said she is anxious about it but knows it is best for her   Pt interacts well with others  She had an altercation with another pt but was able to get herself under control   She thanked staff for helping her to calm down A   Verbal support given   Medications administered and effectiveness monitored    Q 15 min checks   R   Pt safe at present

## 2015-01-20 NOTE — Progress Notes (Signed)
  [facility] Adult Case Management Discharge Plan :  Will you be returning to the same living situation after discharge:  No.[facility].  At discharge, do you have transportation home?: Yes,  pt's stepfather picking her up today and will transport her to [name]. Do you have the ability to pay for your medications: Yes,  [employer] private insurance  Release of information consent forms completed and submitted to medical Records by CSW.  Patient to Follow up at: Follow-up Information    Follow up with [facility] On 01/18/2015.   Why:  You have orientation with Charmian Muff on this date at 2:30PM unless she reschedules with you prior to this time. Thank you!   Contact information:   [address] [place], [place] [address] Phone: [phone] Fax: [phone]      Follow up with [facility].   Why:  referral sent 3/22 at 11:15AM.    Contact information:   [address] [place], [place] [address] Phone: [phone] Fax: [phone]      Patient denies SI/HI: Yes,  during group/self report.     Safety Planning and Suicide Prevention discussed: Yes,  SPE completed with pt's mother. SPI pamphlet provided to pt and she was encouraged to share information with support network, ask questions, and talk about any concerns relating to SPE.  Have you used any form of tobacco in the last 30 days? (Cigarettes, Smokeless Tobacco, Cigars, and/or Pipes): Yes  Has patient been referred to the Quitline?: Patient refused referral "I'm not ready to quit smoking right now."   Smart, Kekoa Fyock LCSWA  01/20/2015, 10:19 AM

## 2015-01-20 NOTE — Discharge Summary (Signed)
Physician Discharge Summary Note  Patient:  Beth [name] is an 34 y.o., female MRN:  [id number] DOB:  01/02/1981  Patient phone:  [phone] (home)  Patient address:   [address] [place] [address],  Total Time spent with patient: Greater than 15 minutes  Date of Admission:  01/13/2015 Date of Discharge: 01/20/2015  Reason for Admission:  Per H&P admission:  Beth [name] is an 34 y.o. female presenting to ED after drinking heavily today after two months of being sober. Pt reports she has been having SI with some planning but denies intent. She expressed that she was tired of drinking. She was previously living in [place] ([place]) but moved to [place] to stay with her father and get away from influences of ETOH. She states not getting along with her step mother so she moved back to [place]. She met up with an ex-BF and began to drink. She can not recall why she bought and drank gallon of vodka yesterday. She reports SI with planning, denies AVH, HI, and current SA. She reports she was drinking 1/2 gallon to 2 gallons of vodka per day after her sister died. She reports she thinks about killing herself frequently and it has become more often, and longer lasting. However, it is intermittent and could not specify specific emotional triggers when they come on.    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 Alcohol use disorder, moderate, dependence [F10.20] 01/13/2015  . Free intraperitoneal air [K66.8] 08/18/2014  . Perforated ulcer [K27.5] 08/18/2014  . 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 Acid reflux [K21.9]   . History of blood transfusion [Z92.89]   . Multiple sclerosis [G35]  03/13/2012  . H/O gastric bypass [Z98.84] 03/13/2012    Musculoskeletal: Strength & Muscle Tone: within normal limits Gait & Station: normal Patient leans: N/A  Psychiatric Specialty Exam:  See Suicide Risk Assessment Physical Exam  Constitutional: She is oriented to person, place, and time.  Neck: Normal range of motion.  Respiratory: Effort normal.  Musculoskeletal: Normal range of motion.  Neurological: She is alert and oriented to person, place, and time.    Review of Systems  Psychiatric/Behavioral: Negative for suicidal ideas and memory loss. Depression: Stable. Hallucinations: Stable. Nervous/anxious: Stable. Insomnia: Stable.     Blood pressure 96/56, pulse 110, temperature 97.9 F (36.6 C), temperature source Oral, resp. rate 20, height $RemoveBe'5\' 6"'kqUqJircY$  (1.676 m), weight 104.101 kg (229 lb 8 oz), last menstrual period 12/14/2014.Body mass index is 37.06 kg/(m^2).    Past Medical History:  Past Medical History  Diagnosis Date  . Acid reflux   . Asthma   . History of blood transfusion   . Multiple sclerosis 2005    diagnosed in [place], failed steroids, no other medication  . Anxiety   . Gastric ulcer   . Migraine   . Chronic nausea     self reported  . Chronic diarrhea     self reported  . Chronic pain     self reported chronic pain, back pain, fibromyalgia  . Insomnia   . Polysubstance abuse   . ETOH abuse   . Delirium tremens     Past Surgical History  Procedure Laterality Date  . Gastric bypass    . Diagnostic laparoscopy  2003    repair of perforation at [facility] anastamosis   . Lumbar laminectomy/decompression microdiscectomy Right 04/14/2014    Procedure: LUMBAR LAMINECTOMY/DECOMPRESSION MICRODISCECTOMY LEVEL L4-5;  Surgeon: Erline Levine, MD;  Location: [facility];  Service: Neurosurgery;  Laterality: Right;  LUMBAR LAMINECTOMY/DECOMPRESSION MICRODISCECTOMY LEVEL L4-5  . Laparotomy N/A 08/18/2014    Procedure: EXPLORATORY LAPAROTOMY, Patch graft of gastric  perforation;  Surgeon: Coralie Keens, MD;  Location: [facility];  Service: General;  Laterality: N/A;   Family History:  Family History  Problem Relation Age of Onset  . Hypertension Paternal Grandfather   . Colon cancer Paternal Grandfather 47  . Asthma Paternal Grandmother   . Hypertension Paternal Grandmother   . Breast cancer Paternal Grandmother   . Cancer Paternal Grandmother     lung   . Diabetes Maternal Grandmother   . Diabetes Maternal Grandfather   . Hypertension Father   . Urinary tract infection Mother   . Stroke Mother 40    3 strokes    Social History:  History  Alcohol Use  . 195.6 oz/week  . 25 Cans of beer, 301 Shots of liquor per week    Comment: Says she can drink up to a 1/2 gallon/day     History  Drug Use No    Comment: cocaine-none >1 year.  meth-none >10 years.    History   Social History  . Marital Status: Married    Spouse Name: N/A  . Number of Children: N/A  . Years of Education: N/A   Social History Main Topics  . Smoking status: Current Some Day Smoker -- 0.10 packs/day    Types: Cigarettes  . Smokeless tobacco: Never Used  . Alcohol Use: 195.6 oz/week    25 Cans of beer, 301 Shots of liquor per week     Comment: Says she can drink up to a 1/2 gallon/day  . Drug Use: No     Comment: cocaine-none >1 year.  meth-none >10 years.  . Sexual Activity: Yes    Birth Control/ Protection: None   Other Topics Concern  . 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  She was treated discharged with the medications listed below under Medication List.  Medical problems were identified and treated as needed.  Home medications were  restarted as appropriate.  Improvement was monitored by observation and Beth [name] daily report of symptom reduction.  Emotional and mental status was monitored by daily self-inventory reports completed by Beth [name] and clinical staff.         Beth [name] was evaluated by the treatment team for stability and plans for continued recovery upon discharge.  Beth [name] motivation was an integral factor for scheduling further treatment.  Employment, transportation, bed availability, health status, family support, and any pending legal issues were also considered during her hospital stay.  She was offered further treatment options upon discharge including but not limited to Residential, Intensive Outpatient, and Outpatient treatment.  Beth [name] will follow up with the services as listed below under Follow Up Information.     Upon completion of this admission the patient was both mentally  and medically stable for discharge denying suicidal/homicidal ideation, auditory/visual/tactile hallucinations, delusional thoughts and paranoia.      Consults:  psychiatry  Significant Diagnostic Studies:  labs: TSH, Pregnancy, UDS, CMC, CMET, ETOH  Discharge Vitals:   Blood pressure 96/56, pulse 110, temperature 97.9 F (36.6 C), temperature source Oral, resp. rate 20, height _0  (1.676 m), weight 104.101 kg (229 lb 8 oz), last menstrual period 12/14/2014. Body mass index is 37.06 kg/(m^2). Lab Results:   No results found for this or any previous visit (from the past 72 hour(s)).  Physical Findings: AIMS: Facial and Oral Movements Muscles of Facial Expression: None, normal Lips and Perioral Area: None, normal Jaw: None, normal Tongue: None, normal,Extremity Movements Upper (arms, wrists, hands, fingers): None, normal Lower (legs, knees, ankles, toes): None, normal, Trunk Movements Neck, shoulders, hips: None, normal, Overall Severity Severity of abnormal movements (highest score from questions  above): None, normal Incapacitation due to abnormal movements: None, normal Patient's awareness of abnormal movements (rate only patient's report): No Awareness, Dental Status Current problems with teeth and/or dentures?: Yes ("holes in teeth') Does patient usually wear dentures?: No  CIWA:  CIWA-Ar Total: 3 COWS:      See Psychiatric Specialty Exam and Suicide Risk Assessment completed by Attending Physician prior to discharge.  Discharge destination:  RTC  Is patient on multiple antipsychotic therapies at discharge:  No   Has Patient had three or more failed trials of antipsychotic monotherapy by history:  No    Recommended Plan for Multiple Antipsychotic Therapies: NA      Discharge Instructions    Activity as tolerated - No restrictions    Complete by:  As directed      Diet general    Complete by:  As directed      Discharge instructions    Complete by:  As directed   Take all of you medications as prescribed by your mental healthcare provider.  Report any adverse effects and reactions from your medications to your outpatient provider promptly. Do not engage in alcohol and or illegal drug use while on prescription medicines. In the event of worsening symptoms call the crisis hotline, 911, and or go to the nearest emergency department for appropriate evaluation and treatment of symptoms. Follow-up with your primary care provider for your medical issues, concerns and or health care needs.   Keep all scheduled appointments.  If you are unable to keep an appointment call to reschedule.  Let the nurse know if you will need medications before next scheduled appointment.            Medication List    STOP taking these medications        ciprofloxacin 500 MG tablet  Commonly known as:  CIPRO     oxyCODONE-acetaminophen 5-325 MG per tablet  Commonly known as:  PERCOCET/ROXICET     phenazopyridine 100 MG tablet  Commonly known as:  PYRIDIUM      TAKE these medications       Indication   acetaminophen 650 MG CR tablet  Commonly known as:  TYLENOL  Take 2,600 mg by mouth once.      amoxicillin 500 MG capsule  Commonly known as:  AMOXIL  Take 1 capsule (500 mg total) by mouth every 8 (eight) hours. For bacterial infection      busPIRone 5 MG tablet  Commonly known as:  BUSPAR  Take 1 tablet (5 mg total) by mouth 2 (two) times daily. For anxiety   Indication:  Anxiety Disorder     diclofenac sodium 1 % Gel  Commonly known as:  VOLTAREN  Apply 4 g topically 4 (four) times daily. For pain   Indication:  pain     gabapentin 300 MG capsule  Commonly known as:  NEURONTIN  Take 1 capsule (300 mg total) by mouth 4 (four) times daily. For agitation   Indication:  Agitation     hydrOXYzine 25 MG tablet  Commonly known as:  ATARAX/VISTARIL  Take 1 tablet (25 mg total) by mouth 3 (three) times daily as needed for anxiety.   Indication:  Anxiety Neurosis     lidocaine 5 %  Commonly known as:  LIDODERM  Place 1 patch onto the skin daily. For pain.  Remove & Discard patch within 12 hours or as directed by MD   Indication:  Pain     methocarbamol 500 MG tablet  Commonly known as:  ROBAXIN  Take 2 tablets (1,000 mg total) by mouth every 8 (eight) hours as needed for muscle spasms.      neomycin-bacitracin-polymyxin Oint  Commonly known as:  NEOSPORIN  Apply 1 application topically 2 (two) times daily. For bacterial infection      nortriptyline 75 MG capsule  Commonly known as:  PAMELOR  Take 1 capsule (75 mg total) by mouth at bedtime. For depression   Indication:  Depression, Panic Disorder     promethazine 25 MG tablet  Commonly known as:  PHENERGAN  Take 25 mg by mouth every 6 (six) hours as needed.      SUMAtriptan 50 MG tablet  Commonly known as:  IMITREX  Take 1 tablet (50 mg total) by mouth every 2 (two) hours as needed for migraine or headache. May repeat in 2 hours if headache persists or recurs.   Indication:  Headache     traZODone  150 MG tablet  Commonly known as:  DESYREL  Take 1 tablet (150 mg total) by mouth at bedtime as needed for sleep.        Follow-up Information    Follow up with [facility] On 01/20/2015.   Why:  Accepted for admission on this date at 4:00PM. $300 deposit required upon admission. Thank you!    Contact information:   [address] [place], [place] [address] Phone: [phone] Fax: [phone]      Follow-up recommendations:  Activity:  As tolerated Diet:  As tolerated  Comments:   Patient has been instructed to take medications as prescribed; and report adverse effects to outpatient provider.  Follow up with primary doctor for any medical issues and If symptoms recur report to nearest emergency or crisis hot line.    Total Discharge Time: Greater than 30 minutes  Signed: Earleen Newport, FNP- [facility] 01/20/2015, 1:32 PM  Patient seen face-to-face for psychiatric evaluation, chart reviewed and case discussed with the physician extender and developed treatment plan. Reviewed the information documented and agree with the treatment plan. Corena Pilgrim, MD

## 2015-01-20 NOTE — Progress Notes (Signed)
DISCHARGE NOTE: D: Patient was alert, oriented, in stable condition, and ambulatory with a steady gait upon discharge. Pt denies SI/HI and AVH. A: AVS reviewed and given to pt. Follow up reviewed with pt. Resources reviewed with pt, including [facility]. Prescriptions/Medications given to pt. Belongings returned to pt. Pt given time to ask questions and express concerns. R: Pt D/C'd to father with [facility] to [facility].

## 2015-01-20 NOTE — Progress Notes (Signed)
D: Patient is alert and oriented. Pt's mood and affect is anxious and appropriate to circumstance. Pt denies SI/HI and AVH. Pt reports she is ready to leave [facility] to go to [facility] today. Pt states her Dad will be picking her up. Pt complains of migraine and tooth ache this morning both, 8/10. Pt complains of anxiety and nicotine craving. Pt rates her depression 4/10, denies hopelessness, and rates anxiety 10/10 d/t [facility] admission. Pt is observed tearful in the day room this morning and expresses concerns about the "unknown" and is "nervous" to go to [facility]. A: Encouragement/Support provided to pt. Active listening by RN. PRN medication administered for migraine, tooth ache, anxiety, and nicotine craving per providers orders (See MAR). Scheduled medications administered per providers orders (See MAR). 15 minute checks continued per protocol for patient safety.  R: Patient cooperative and receptive to nursing interventions. Pt remains safe.

## 2015-01-22 NOTE — [facility] Group Notes (Signed)
[facility] Group Notes: (Clinical Social Work)   01/22/2015      Type of Therapy:  Group Therapy   Participation Level:  Did Not Attend despite MHT prompting   Jairon Ripberger Grossman-Orr, LCSW 01/22/2015, 4:38 PM     

## 2015-01-24 NOTE — Progress Notes (Signed)
Patient Discharge Instructions:  After Visit Summary (AVS):   Faxed to:  01/24/15 Discharge Summary Note:   Faxed to:  01/24/15 Psychiatric Admission Assessment Note:   Faxed to:  01/24/15 Suicide Risk Assessment - Discharge Assessment:   Faxed to:  01/24/15 Faxed/Sent to the Next Level Care provider:  01/24/15 Faxed to [facility] @ [phone]  Jerelene Redden, 01/24/2015, 3:55 PM

## 2015-03-10 ENCOUNTER — Emergency Department (HOSPITAL_COMMUNITY): Payer: BLUE CROSS/BLUE SHIELD

## 2015-03-10 ENCOUNTER — Encounter (HOSPITAL_COMMUNITY): Payer: Self-pay | Admitting: *Deleted

## 2015-03-10 ENCOUNTER — Emergency Department (HOSPITAL_COMMUNITY)
Admission: EM | Admit: 2015-03-10 | Discharge: 2015-03-11 | Disposition: A | Discharge status: Other Acute Inpatient Hospital | Payer: BLUE CROSS/BLUE SHIELD | Attending: Emergency Medicine | Admitting: Emergency Medicine

## 2015-03-10 DIAGNOSIS — Z3202 Encounter for pregnancy test, result negative: Secondary | ICD-10-CM | POA: Diagnosis not present

## 2015-03-10 DIAGNOSIS — R45851 Suicidal ideations: Secondary | ICD-10-CM | POA: Diagnosis present

## 2015-03-10 DIAGNOSIS — F419 Anxiety disorder, unspecified: Secondary | ICD-10-CM | POA: Insufficient documentation

## 2015-03-10 DIAGNOSIS — F1410 Cocaine abuse, uncomplicated: Secondary | ICD-10-CM | POA: Diagnosis not present

## 2015-03-10 DIAGNOSIS — Z720 Tobacco use: Secondary | ICD-10-CM | POA: Insufficient documentation

## 2015-03-10 DIAGNOSIS — Z79899 Other long term (current) drug therapy: Secondary | ICD-10-CM | POA: Insufficient documentation

## 2015-03-10 DIAGNOSIS — Z8679 Personal history of other diseases of the circulatory system: Secondary | ICD-10-CM | POA: Insufficient documentation

## 2015-03-10 DIAGNOSIS — Z792 Long term (current) use of antibiotics: Secondary | ICD-10-CM | POA: Insufficient documentation

## 2015-03-10 DIAGNOSIS — F329 Major depressive disorder, single episode, unspecified: Secondary | ICD-10-CM | POA: Insufficient documentation

## 2015-03-10 DIAGNOSIS — Z8719 Personal history of other diseases of the digestive system: Secondary | ICD-10-CM | POA: Insufficient documentation

## 2015-03-10 DIAGNOSIS — F1010 Alcohol abuse, uncomplicated: Secondary | ICD-10-CM

## 2015-03-10 DIAGNOSIS — F1310 Sedative, hypnotic or anxiolytic abuse, uncomplicated: Secondary | ICD-10-CM | POA: Insufficient documentation

## 2015-03-10 DIAGNOSIS — Z791 Long term (current) use of non-steroidal anti-inflammatories (NSAID): Secondary | ICD-10-CM | POA: Insufficient documentation

## 2015-03-10 DIAGNOSIS — J45901 Unspecified asthma with (acute) exacerbation: Secondary | ICD-10-CM | POA: Diagnosis not present

## 2015-03-10 DIAGNOSIS — F1910 Other psychoactive substance abuse, uncomplicated: Secondary | ICD-10-CM

## 2015-03-10 DIAGNOSIS — G8929 Other chronic pain: Secondary | ICD-10-CM | POA: Diagnosis not present

## 2015-03-10 DIAGNOSIS — R079 Chest pain, unspecified: Secondary | ICD-10-CM | POA: Diagnosis not present

## 2015-03-10 HISTORY — DX: Major depressive disorder, single episode, unspecified: F32.9

## 2015-03-10 HISTORY — DX: Depression, unspecified: F32.A

## 2015-03-10 LAB — I-STAT TROPONIN, ED: TROPONIN I, POC: 0 ng/mL (ref 0.00–0.08)

## 2015-03-10 LAB — COMPREHENSIVE METABOLIC PANEL
ALT: 34 U/L (ref 14–54)
AST: 36 U/L (ref 15–41)
Albumin: 3.7 g/dL (ref 3.5–5.0)
Alkaline Phosphatase: 97 U/L (ref 38–126)
Anion gap: 16 — ABNORMAL HIGH (ref 5–15)
BUN: 8 mg/dL (ref 6–20)
CALCIUM: 9.1 mg/dL (ref 8.9–10.3)
CO2: 23 mmol/L (ref 22–32)
CREATININE: 0.88 mg/dL (ref 0.44–1.00)
Chloride: 99 mmol/L — ABNORMAL LOW (ref 101–111)
GLUCOSE: 107 mg/dL — AB (ref 65–99)
Potassium: 3.7 mmol/L (ref 3.5–5.1)
Sodium: 138 mmol/L (ref 135–145)
Total Bilirubin: 0.5 mg/dL (ref 0.3–1.2)
Total Protein: 7.2 g/dL (ref 6.5–8.1)

## 2015-03-10 LAB — CBC
HCT: 42.1 % (ref 36.0–46.0)
Hemoglobin: 13.6 g/dL (ref 12.0–15.0)
MCH: 23.9 pg — ABNORMAL LOW (ref 26.0–34.0)
MCHC: 32.3 g/dL (ref 30.0–36.0)
MCV: 74 fL — AB (ref 78.0–100.0)
Platelets: 440 10*3/uL — ABNORMAL HIGH (ref 150–400)
RBC: 5.69 MIL/uL — ABNORMAL HIGH (ref 3.87–5.11)
RDW: 21.8 % — AB (ref 11.5–15.5)
WBC: 12.9 10*3/uL — AB (ref 4.0–10.5)

## 2015-03-10 LAB — SALICYLATE LEVEL

## 2015-03-10 LAB — ETHANOL: ALCOHOL ETHYL (B): 288 mg/dL — AB (ref <5)

## 2015-03-10 LAB — ACETAMINOPHEN LEVEL: Acetaminophen (Tylenol), Serum: <10 ug/mL — ABNORMAL LOW (ref 10–30)

## 2015-03-10 MED ORDER — VITAMIN B-1 100 MG PO TABS
100.0000 mg | ORAL_TABLET | Freq: Every day | ORAL | Status: DC
  Administered 2015-03-11: 100 mg via ORAL
  Filled 2015-03-10: qty 1

## 2015-03-10 MED ORDER — LORAZEPAM 2 MG/ML IJ SOLN: 0.0000 mg | Freq: Four times a day (QID) | INTRAMUSCULAR | Status: DC

## 2015-03-10 MED ORDER — THIAMINE HCL 100 MG/ML IJ SOLN: 100.0000 mg | Freq: Every day | INTRAMUSCULAR | Status: DC

## 2015-03-10 MED ORDER — LORAZEPAM 1 MG PO TABS
0.0000 mg | ORAL_TABLET | Freq: Four times a day (QID) | ORAL | Status: DC
  Administered 2015-03-11: 1 mg via ORAL
  Administered 2015-03-11 (×2): 2 mg via ORAL
  Filled 2015-03-10: qty 1
  Filled 2015-03-10: qty 2

## 2015-03-10 MED ORDER — LORAZEPAM 2 MG/ML IJ SOLN: 0.0000 mg | Freq: Two times a day (BID) | INTRAMUSCULAR | Status: DC

## 2015-03-10 MED ORDER — LORAZEPAM 1 MG PO TABS
0.0000 mg | ORAL_TABLET | Freq: Two times a day (BID) | ORAL | Status: DC
  Administered 2015-03-10: 1 mg via ORAL
  Filled 2015-03-10: qty 2

## 2015-03-10 NOTE — ED Notes (Signed)
Pt. Unable to void at the moment. 

## 2015-03-10 NOTE — ED Notes (Addendum)
Pt from home for eval of ETOH abuse and cocaine abuse. Pt states has been on binge x5 days while at ex boyfriends house. Pt does reports snorting "3 lines of cocaine and also snorting roxicet." Pt states [facility] and knows "what I am not suppose to do to be successful however I did them anyway." Pt reports SI with no plan at this time, cooperative and calm.

## 2015-03-10 NOTE — ED Notes (Addendum)
Pt requesting detox from ETOH. Last drink was tonight just prior to coming to ED. Pt drinks a 1/2 gal to 1 gal of [place] everyday for the past five days. Pt admits to SI, no plan at this time. Denies HI

## 2015-03-10 NOTE — ED Provider Notes (Signed)
CSN: [id number]     Arrival date & time 03/10/15  2138 History   First MD Initiated Contact with Patient 03/10/15 2213     Chief Complaint  Patient presents with  . Alcohol Problem  . Drug Problem  . Suicidal     (Consider location/radiation/quality/duration/timing/severity/associated sxs/prior Treatment) HPI Comments: Patient is a 34 yo PMHx significant for Chronic pain, chronic nausea and diarrhea, asthma, acid reflux, polysusbtance abuse, DT, ETOH abuse presenting to the ED requesting detox from ETOH (0.5 to 1 gallon of Vodka daily last drink just PTA) and suicidal ideations without plan. She states she feels very dehydrated, has not been eating or drinking well the last several days. She does endorse using cocaine and oxycodone. She states she had some chest tightness and shortness of breath earlier today but has resolved. She has not had any loss she is in the emergency department. She denies any hallucinations, self injury, homicidal ideation. Patient denies any history of alcohol withdrawal seizures.  Patient is a 34 y.o. female presenting with alcohol problem and drug problem.  Alcohol Problem This is a chronic problem. Associated symptoms include nausea. Pertinent negatives include no abdominal pain or vomiting.  Drug Problem Associated symptoms include nausea. Pertinent negatives include no abdominal pain or vomiting.    Past Medical History  Diagnosis Date  . Acid reflux   . Asthma   . History of blood transfusion   . Multiple sclerosis 2005    diagnosed in [place], failed steroids, no other medication  . Anxiety   . Gastric ulcer   . Migraine   . Chronic nausea     self reported  . Chronic diarrhea     self reported  . Chronic pain     self reported chronic pain, back pain, fibromyalgia  . Insomnia   . Polysubstance abuse   . ETOH abuse   . Delirium tremens   . Depression    Past Surgical History  Procedure Laterality Date  . Gastric bypass    . Diagnostic  laparoscopy  2003    repair of perforation at [facility] anastamosis   . Lumbar laminectomy/decompression microdiscectomy Right 04/14/2014    Procedure: LUMBAR LAMINECTOMY/DECOMPRESSION MICRODISCECTOMY LEVEL L4-5;  Surgeon: Maeola Harman, MD;  Location: [facility];  Service: Neurosurgery;  Laterality: Right;  LUMBAR LAMINECTOMY/DECOMPRESSION MICRODISCECTOMY LEVEL L4-5  . Laparotomy N/A 08/18/2014    Procedure: EXPLORATORY LAPAROTOMY, Patch graft of gastric perforation;  Surgeon: Abigail Miyamoto, MD;  Location: [facility] OR;  Service: General;  Laterality: N/A;   Family History  Problem Relation Age of Onset  . Hypertension Paternal Grandfather   . Colon cancer Paternal Grandfather 80  . Asthma Paternal Grandmother   . Hypertension Paternal Grandmother   . Breast cancer Paternal Grandmother   . Cancer Paternal Grandmother     lung   . Diabetes Maternal Grandmother   . Diabetes Maternal Grandfather   . Hypertension Father   . Urinary tract infection Mother   . Stroke Mother 41    3 strokes    History  Substance Use Topics  . Smoking status: Current Some Day Smoker -- 0.10 packs/day    Types: Cigarettes  . Smokeless tobacco: Never Used  . Alcohol Use: 195.6 oz/week    25 Cans of beer, 301 Shots of liquor per week     Comment: Says she can drink up to a 1/2 gallon/day   OB History    Gravida Para Term Preterm AB TAB SAB Ectopic Multiple Living  0         0     Review of Systems  Respiratory: Positive for chest tightness and shortness of breath.   Gastrointestinal: Positive for nausea. Negative for vomiting and abdominal pain.  Psychiatric/Behavioral: Positive for suicidal ideas and decreased concentration.  All other systems reviewed and are negative.     Allergies  Cymbalta; Dilaudid; and Prednisone  Home Medications   Prior to Admission medications   Medication Sig Start Date End Date Taking? Authorizing Provider  acetaminophen (TYLENOL) 650 MG CR tablet Take 2,600 mg by mouth  once.    Historical Provider, MD  amoxicillin (AMOXIL) 500 MG capsule Take 1 capsule (500 mg total) by mouth every 8 (eight) hours. For bacterial infection 01/20/15   Shuvon B Rankin, NP  busPIRone (BUSPAR) 5 MG tablet Take 1 tablet (5 mg total) by mouth 2 (two) times daily. For anxiety 01/20/15   Shuvon B Rankin, NP  diclofenac sodium (VOLTAREN) 1 % GEL Apply 4 g topically 4 (four) times daily. For pain 01/20/15   Shuvon B Rankin, NP  gabapentin (NEURONTIN) 300 MG capsule Take 1 capsule (300 mg total) by mouth 4 (four) times daily. For agitation 01/20/15   Shuvon B Rankin, NP  hydrOXYzine (ATARAX/VISTARIL) 25 MG tablet Take 1 tablet (25 mg total) by mouth 3 (three) times daily as needed for anxiety. 01/20/15   Shuvon B Rankin, NP  lidocaine (LIDODERM) 5 % Place 1 patch onto the skin daily. For pain.  Remove & Discard patch within 12 hours or as directed by MD 01/20/15   Shuvon B Rankin, NP  methocarbamol (ROBAXIN) 500 MG tablet Take 2 tablets (1,000 mg total) by mouth every 8 (eight) hours as needed for muscle spasms. Patient not taking: Reported on 01/12/2015 08/26/14   Megan N Dort, PA-C  neomycin-bacitracin-polymyxin (NEOSPORIN) OINT Apply 1 application topically 2 (two) times daily. For bacterial infection 01/20/15   Shuvon B Rankin, NP  nortriptyline (PAMELOR) 75 MG capsule Take 1 capsule (75 mg total) by mouth at bedtime. For depression 01/20/15   Shuvon B Rankin, NP  promethazine (PHENERGAN) 25 MG tablet Take 25 mg by mouth every 6 (six) hours as needed. 10/09/14   Historical Provider, MD  SUMAtriptan (IMITREX) 50 MG tablet Take 1 tablet (50 mg total) by mouth every 2 (two) hours as needed for migraine or headache. May repeat in 2 hours if headache persists or recurs. 01/20/15   Shuvon B Rankin, NP  traZODone (DESYREL) 150 MG tablet Take 1 tablet (150 mg total) by mouth at bedtime as needed for sleep. 01/20/15   Shuvon B Rankin, NP   BP 121/76 mmHg  Pulse 95  Temp(Src) 97.8 F (36.6 C) (Oral)  Resp  20  Ht 5\' 6"  (1.676 m)  Wt 238 lb 3.2 oz (108.047 kg)  BMI 38.46 kg/m2  SpO2 98%  LMP 02/08/2015 Physical Exam  Constitutional: She is oriented to person, place, and time. She appears well-developed and well-nourished. No distress.  HENT:  Head: Normocephalic and atraumatic.  Right Ear: External ear normal.  Left Ear: External ear normal.  Nose: Nose normal.  Mouth/Throat: Uvula is midline and oropharynx is clear and moist. Mucous membranes are dry.  Eyes: Conjunctivae and EOM are normal. Pupils are equal, round, and reactive to light.  Cardiovascular: Normal rate, regular rhythm and normal heart sounds.   Pulmonary/Chest: Effort normal and breath sounds normal.  Abdominal: Soft. Bowel sounds are normal. There is no tenderness.  Musculoskeletal: She exhibits no edema.  Neurological: She is alert and oriented to person, place, and time.  Skin: Skin is warm and dry. She is not diaphoretic.  Psychiatric: She exhibits a depressed mood. She expresses suicidal ideation. She expresses no homicidal ideation. She expresses no suicidal plans and no homicidal plans.  Nursing note and vitals reviewed.   ED Course  Procedures (including critical care time) Medications  LORazepam (ATIVAN) tablet 0-4 mg (1 mg Oral Given 03/11/15 0009)  LORazepam (ATIVAN) tablet 0-4 mg (1 mg Oral Given 03/10/15 2300)  LORazepam (ATIVAN) injection 0-4 mg (0 mg Intravenous Duplicate 03/11/15 0218)  LORazepam (ATIVAN) injection 0-4 mg (0 mg Intravenous Duplicate 03/11/15 0303)  thiamine (VITAMIN B-1) tablet 100 mg (not administered)  thiamine (B-1) injection 100 mg (not administered)  alum & mag hydroxide-simeth (MAALOX/MYLANTA) 200-200-20 MG/5ML suspension 30 mL (not administered)  ondansetron (ZOFRAN) tablet 4 mg (not administered)  LORazepam (ATIVAN) tablet 1 mg (not administered)  ondansetron (ZOFRAN-ODT) disintegrating tablet 4 mg (4 mg Oral Given 03/11/15 0106)  sodium chloride 0.9 % bolus 1,000 mL (0 mLs  Intravenous Stopped 03/11/15 0519)  LORazepam (ATIVAN) tablet 1 mg (1 mg Oral Given 03/11/15 0410)    Labs Review Labs Reviewed  ACETAMINOPHEN LEVEL - Abnormal; Notable for the following:    Acetaminophen (Tylenol), Serum <10 (*)    All other components within normal limits  CBC - Abnormal; Notable for the following:    WBC 12.9 (*)    RBC 5.69 (*)    MCV 74.0 (*)    MCH 23.9 (*)    RDW 21.8 (*)    Platelets 440 (*)    All other components within normal limits  COMPREHENSIVE METABOLIC PANEL - Abnormal; Notable for the following:    Chloride 99 (*)    Glucose, Bld 107 (*)    Anion gap 16 (*)    All other components within normal limits  ETHANOL - Abnormal; Notable for the following:    Alcohol, Ethyl (B) 288 (*)    All other components within normal limits  URINE RAPID DRUG SCREEN (HOSP PERFORMED) - Abnormal; Notable for the following:    Cocaine POSITIVE (*)    Benzodiazepines POSITIVE (*)    All other components within normal limits  URINALYSIS, ROUTINE W REFLEX MICROSCOPIC - Abnormal; Notable for the following:    Color, Urine AMBER (*)    APPearance CLOUDY (*)    Bilirubin Urine SMALL (*)    Ketones, ur 15 (*)    All other components within normal limits  SALICYLATE LEVEL  POC URINE PREG, ED  Rosezena Sensor, ED    Imaging Review Dg Chest 2 View  03/10/2015   CLINICAL DATA:  Shortness of breath. Chest pain. History of asthma.  EXAM: CHEST  2 VIEW  COMPARISON:  08/25/2014  FINDINGS: Shallow inspiration. Normal heart size and pulmonary vascularity. No focal airspace disease or consolidation in the lungs. No blunting of costophrenic angles. No pneumothorax. Mediastinal contours appear intact.  IMPRESSION: No active cardiopulmonary disease.   Electronically Signed   By: Burman Nieves M.D.   On: 03/10/2015 23:04   Dg Lumbar Spine Complete  03/11/2015   CLINICAL DATA:  Alcohol problem. Drug problem. Suicidal. Low back pain. History of L3 fracture with surgery.  EXAM:  LUMBAR SPINE - COMPLETE 4+ VIEW  COMPARISON:  06/28/2014  FINDINGS: Normal alignment of the lumbar spine. Mild degenerative changes with narrowed interspace at L4-5. No vertebral compression deformities. No focal bone lesion or bone destruction. Surgical clips in the left  abdomen.  IMPRESSION: Normal alignment of lumbar spine. Mild degenerative changes. No acute bony abnormalities.   Electronically Signed   By: Burman Nieves M.D.   On: 03/11/2015 03:14     EKG Interpretation   Date/Time:  Thursday Mar 10 2015 23:08:28 EDT Ventricular Rate:  92 PR Interval:  158 QRS Duration: 94 QT Interval:  395 QTC Calculation: 489 R Axis:   79 Text Interpretation:  Sinus rhythm Borderline prolonged QT interval No  significant change since last tracing Confirmed by KNAPP  MD-J, JON  ([id number]) on 03/10/2015 11:14:20 PM      MDM   Final diagnoses:  Chest pain  Alcohol abuse  Polysubstance abuse    Filed Vitals:   03/11/15 0339  BP: 121/76  Pulse: 95  Temp:   Resp:    I have reviewed nursing notes, vital signs, and all appropriate lab and imaging results if ordered as above.   Pt presents to the ED for medical clearance.  Pt is currently having SI ideations. Pt does not have a plan.   Patient with Chest tightness and shortness of breath earlier in the day prior to arrival to the ED. Troponin negative, EKG unchanged, CXR unremarkable.   No evidence of etoh acidosis, DTs, or withdrawal seizures at this time. Placed on CIWA protocol.  Patient hydrated with IVF with improvement of dry mucus membranes.   TTS consult was appreciated and pt was moved to Psych ED for further evaluation.      Francee Piccolo, PA-C 03/11/15 6045  Linwood Dibbles, MD 03/13/15 [phone]

## 2015-03-11 ENCOUNTER — Emergency Department (HOSPITAL_COMMUNITY): Payer: BLUE CROSS/BLUE SHIELD

## 2015-03-11 ENCOUNTER — Encounter (HOSPITAL_COMMUNITY): Payer: Self-pay | Admitting: *Deleted

## 2015-03-11 ENCOUNTER — Inpatient Hospital Stay (HOSPITAL_COMMUNITY)
Admission: AD | Admit: 2015-03-11 | Discharge: 2015-03-18 | DRG: 885 | Disposition: A | Discharge status: Home / Self Care | Payer: BLUE CROSS/BLUE SHIELD | Source: Intra-hospital | Attending: Psychiatry | Admitting: Psychiatry

## 2015-03-11 DIAGNOSIS — F10229 Alcohol dependence with intoxication, unspecified: Secondary | ICD-10-CM | POA: Diagnosis present

## 2015-03-11 DIAGNOSIS — F17210 Nicotine dependence, cigarettes, uncomplicated: Secondary | ICD-10-CM | POA: Diagnosis present

## 2015-03-11 DIAGNOSIS — Y908 Blood alcohol level of 240 mg/100 ml or more: Secondary | ICD-10-CM | POA: Diagnosis present

## 2015-03-11 DIAGNOSIS — F1910 Other psychoactive substance abuse, uncomplicated: Secondary | ICD-10-CM | POA: Diagnosis present

## 2015-03-11 DIAGNOSIS — Z6839 Body mass index (BMI) 39.0-39.9, adult: Secondary | ICD-10-CM

## 2015-03-11 DIAGNOSIS — F1920 Other psychoactive substance dependence, uncomplicated: Secondary | ICD-10-CM | POA: Diagnosis not present

## 2015-03-11 DIAGNOSIS — Z79899 Other long term (current) drug therapy: Secondary | ICD-10-CM

## 2015-03-11 DIAGNOSIS — F332 Major depressive disorder, recurrent severe without psychotic features: Secondary | ICD-10-CM | POA: Diagnosis present

## 2015-03-11 DIAGNOSIS — R45851 Suicidal ideations: Secondary | ICD-10-CM | POA: Diagnosis present

## 2015-03-11 DIAGNOSIS — Z9884 Bariatric surgery status: Secondary | ICD-10-CM

## 2015-03-11 DIAGNOSIS — F1994 Other psychoactive substance use, unspecified with psychoactive substance-induced mood disorder: Secondary | ICD-10-CM | POA: Insufficient documentation

## 2015-03-11 DIAGNOSIS — R079 Chest pain, unspecified: Secondary | ICD-10-CM | POA: Diagnosis not present

## 2015-03-11 DIAGNOSIS — F39 Unspecified mood [affective] disorder: Secondary | ICD-10-CM | POA: Diagnosis present

## 2015-03-11 DIAGNOSIS — F1020 Alcohol dependence, uncomplicated: Secondary | ICD-10-CM | POA: Diagnosis present

## 2015-03-11 LAB — URINALYSIS, ROUTINE W REFLEX MICROSCOPIC
Glucose, UA: NEGATIVE mg/dL
Hgb urine dipstick: NEGATIVE
KETONES UR: 15 mg/dL — AB
Leukocytes, UA: NEGATIVE
Nitrite: NEGATIVE
PROTEIN: NEGATIVE mg/dL
Specific Gravity, Urine: 1.027 (ref 1.005–1.030)
UROBILINOGEN UA: 0.2 mg/dL (ref 0.0–1.0)
pH: 5 (ref 5.0–8.0)

## 2015-03-11 LAB — RAPID URINE DRUG SCREEN, HOSP PERFORMED
Amphetamines: NOT DETECTED
BENZODIAZEPINES: POSITIVE — AB
Barbiturates: NOT DETECTED
Cocaine: POSITIVE — AB
Opiates: NOT DETECTED
TETRAHYDROCANNABINOL: NOT DETECTED

## 2015-03-11 LAB — POC URINE PREG, ED: Preg Test, Ur: NEGATIVE

## 2015-03-11 MED ORDER — ADULT MULTIVITAMIN W/MINERALS CH
1.0000 | ORAL_TABLET | Freq: Every day | ORAL | Status: DC
  Administered 2015-03-11 – 2015-03-18 (×8): 1 via ORAL
  Filled 2015-03-11 (×12): qty 1

## 2015-03-11 MED ORDER — ONDANSETRON HCL 4 MG/2ML IJ SOLN: 4.0000 mg | Freq: Once | INTRAMUSCULAR | Status: DC

## 2015-03-11 MED ORDER — LORAZEPAM 1 MG PO TABS
1.0000 mg | ORAL_TABLET | Freq: Once | ORAL | Status: AC
  Administered 2015-03-11: 1 mg via ORAL
  Filled 2015-03-11: qty 1

## 2015-03-11 MED ORDER — ACETAMINOPHEN 325 MG PO TABS
650.0000 mg | ORAL_TABLET | Freq: Once | ORAL | Status: AC
  Administered 2015-03-11: 650 mg via ORAL
  Filled 2015-03-11: qty 2

## 2015-03-11 MED ORDER — MAGNESIUM HYDROXIDE 400 MG/5ML PO SUSP
30.0000 mL | Freq: Every day | ORAL | Status: DC | PRN
Start: 2015-03-11 — End: 2015-03-18

## 2015-03-11 MED ORDER — LORAZEPAM 1 MG PO TABS: 1.0000 mg | ORAL_TABLET | Freq: Three times a day (TID) | ORAL | Status: DC | PRN

## 2015-03-11 MED ORDER — ONDANSETRON HCL 4 MG PO TABS: 4.0000 mg | ORAL_TABLET | Freq: Three times a day (TID) | ORAL | Status: DC | PRN

## 2015-03-11 MED ORDER — LOPERAMIDE HCL 2 MG PO CAPS: 2.0000 mg | ORAL_CAPSULE | ORAL | Status: AC | PRN

## 2015-03-11 MED ORDER — ACETAMINOPHEN 325 MG PO TABS
650.0000 mg | ORAL_TABLET | Freq: Four times a day (QID) | ORAL | Status: DC | PRN
  Administered 2015-03-12 – 2015-03-18 (×6): 650 mg via ORAL
  Filled 2015-03-11 (×6): qty 2

## 2015-03-11 MED ORDER — CHLORDIAZEPOXIDE HCL 25 MG PO CAPS
25.0000 mg | ORAL_CAPSULE | Freq: Three times a day (TID) | ORAL | Status: AC
  Administered 2015-03-13 (×3): 25 mg via ORAL
  Filled 2015-03-11 (×3): qty 1

## 2015-03-11 MED ORDER — CHLORDIAZEPOXIDE HCL 25 MG PO CAPS
25.0000 mg | ORAL_CAPSULE | Freq: Four times a day (QID) | ORAL | Status: AC
  Administered 2015-03-11 – 2015-03-12 (×6): 25 mg via ORAL
  Filled 2015-03-11 (×6): qty 1

## 2015-03-11 MED ORDER — SODIUM CHLORIDE 0.9 % IV BOLUS (SEPSIS)
1000.0000 mL | Freq: Once | INTRAVENOUS | Status: AC
  Administered 2015-03-11: 1000 mL via INTRAVENOUS

## 2015-03-11 MED ORDER — ONDANSETRON 4 MG PO TBDP
4.0000 mg | ORAL_TABLET | Freq: Once | ORAL | Status: AC
  Administered 2015-03-11: 4 mg via ORAL
  Filled 2015-03-11: qty 1

## 2015-03-11 MED ORDER — ONDANSETRON 4 MG PO TBDP
4.0000 mg | ORAL_TABLET | Freq: Four times a day (QID) | ORAL | Status: AC | PRN
  Administered 2015-03-14: 4 mg via ORAL
  Filled 2015-03-11: qty 1

## 2015-03-11 MED ORDER — TRAZODONE HCL 50 MG PO TABS
50.0000 mg | ORAL_TABLET | Freq: Every evening | ORAL | Status: DC | PRN
  Administered 2015-03-11: 50 mg via ORAL
  Filled 2015-03-11: qty 1

## 2015-03-11 MED ORDER — ALUM & MAG HYDROXIDE-SIMETH 200-200-20 MG/5ML PO SUSP: 30.0000 mL | ORAL | Status: DC | PRN

## 2015-03-11 MED ORDER — HYDROXYZINE HCL 25 MG PO TABS
25.0000 mg | ORAL_TABLET | Freq: Four times a day (QID) | ORAL | Status: AC | PRN
  Administered 2015-03-11 – 2015-03-14 (×6): 25 mg via ORAL
  Filled 2015-03-11 (×7): qty 1

## 2015-03-11 MED ORDER — VITAMIN B-1 100 MG PO TABS
100.0000 mg | ORAL_TABLET | Freq: Every day | ORAL | Status: DC
  Administered 2015-03-12 – 2015-03-18 (×7): 100 mg via ORAL
  Filled 2015-03-11 (×10): qty 1

## 2015-03-11 MED ORDER — NICOTINE 21 MG/24HR TD PT24
21.0000 mg | MEDICATED_PATCH | Freq: Every day | TRANSDERMAL | Status: DC
  Administered 2015-03-11 – 2015-03-12 (×2): 21 mg via TRANSDERMAL
  Filled 2015-03-11 (×5): qty 1

## 2015-03-11 MED ORDER — CHLORDIAZEPOXIDE HCL 25 MG PO CAPS: 25.0000 mg | ORAL_CAPSULE | Freq: Four times a day (QID) | ORAL | Status: AC | PRN

## 2015-03-11 MED ORDER — THIAMINE HCL 100 MG/ML IJ SOLN: 100.0000 mg | Freq: Once | INTRAMUSCULAR | Status: DC

## 2015-03-11 MED ORDER — CHLORDIAZEPOXIDE HCL 25 MG PO CAPS
25.0000 mg | ORAL_CAPSULE | ORAL | Status: AC
  Administered 2015-03-14 (×2): 25 mg via ORAL
  Filled 2015-03-11 (×2): qty 1

## 2015-03-11 MED ORDER — CHLORDIAZEPOXIDE HCL 25 MG PO CAPS
25.0000 mg | ORAL_CAPSULE | Freq: Every day | ORAL | Status: AC
  Administered 2015-03-15: 25 mg via ORAL
  Filled 2015-03-11: qty 1

## 2015-03-11 NOTE — ED Notes (Signed)
called [facility] for transfer to [facility]- Could not give An ETA.

## 2015-03-11 NOTE — Progress Notes (Signed)
Report received from admission RN, Rayfield Citizen. Resumed pt care at 1630 D: Patient is alert and oriented. Pt complains of withdrawal symptoms including tremors and anxiety. No relief from PRN medication. Pt is requesting sleep medication for tonight. Pt reports that she did not call her sponsor when she should have for help, pt states "I'm glad I made it back here rather than the alternative." Pt reports her mother is going to bring her current medication list to [facility] tonight during visiting hours. A: Active listening by RN. Encouragement/Support provided to pt. Medication education reviewed with pt. PRN medication administered for anxiety per providers orders (See MAR). Scheduled medications administered per providers orders (See MAR). 15 minute checks continued per protocol for patient safety.  R: Patient cooperative and receptive to nursing interventions. Pt remains safe.

## 2015-03-11 NOTE — Progress Notes (Signed)
Pt accepted to [facility] bed 304-2 per Minerva Areola, [facility]. Dr. Dub Mikes accepting. Admit is voluntary.   AC spoke with [facility] RN regarding pt's placement.   Ilean Skill, MSW, LCSWA Clinical Social Work, Disposition  03/11/2015 [phone]

## 2015-03-11 NOTE — ED Notes (Signed)
Pt states that she is starting to feel shaky again.

## 2015-03-11 NOTE — ED Provider Notes (Signed)
Accepted by Dr. Elna Breslow at [facility]  Eber Hong, MD 03/11/15 [phone]

## 2015-03-11 NOTE — ED Notes (Signed)
Pt. Attempting to eat but unable to at the moment due to feeling nauseous.

## 2015-03-11 NOTE — ED Notes (Signed)
Patient is resting comfortably; sitter at bedside °

## 2015-03-11 NOTE — Progress Notes (Signed)
Nursing admission note:  Patient is a 34 yo female that presented to [facility] for alcohol, cocaine and opiate detox.  Patient was also suicidal with no specific plan.  Patient reports increased depression, crying spells and insomnia.  She presents with flat affect and depressed mood.  Patient was here in March and since has been to [name].  She was out for 30 days and went on a 5 day brings and "drank 6 gallons of vodka."  She was also positive for cocaine and benzos.  Patient's BAL was 288 on admit to [facility].  She reports frequent black outs and drinking in the morning.  She also reports have VH of seeing "ashe on the walls and little animals on the floor."  She has a hx of delirium tremens; denies any withdrawal seizures.  Medical hx includes gastric bypass surgery (2003); lumbar laminectormy (2015); exploratory laparotomy due gastric perforation; asthma; chronic back pain; MS (dx 2005).  Patient was oriented to room and unit.

## 2015-03-11 NOTE — ED Notes (Signed)
called [facility] for transfer to [facility]- Could not give An ETA.  

## 2015-03-11 NOTE — Progress Notes (Signed)
D   Pt is pleasant on approach and is cooperative    She reports seeing spiders when she closed her eyes during the AA group prayer    She is wanting to go back to long term treatment after she finishes detoxing A    Verbal support given   Medications administered and effectiveness monitored   Q 15 min checks R   Pt safe at present

## 2015-03-11 NOTE — ED Notes (Signed)
Pt informed that urine needs to be collected before xray will come and get her for lumbar spine, pt told if she cannot produce urine an in and out may need to be done. Pt ambulatory to restroom to try and provide urine sample.

## 2015-03-11 NOTE — ED Notes (Signed)
Patient transported to X-ray 

## 2015-03-11 NOTE — ED Notes (Signed)
Pt is resting in bed on cardiac monitor with sitter at bedside; no signs of distress.

## 2015-03-11 NOTE — BH Assessment (Signed)
Tele Assessment Note   Beth [name] is a 34 y.o. female who presents to [facility] for alcohol, cocaine and opiate detox and SI w/no plan or intent to harm herself.  Pt reports that she went on a 5 day binge and drank 1/2 gal of alcohol, her last drink was 03/10/15, she had 2 shots before coming to the [employer].  Pt also uses cocaine, recreationally, stating that she uses 1 gram of cocaine with friends.  Pt also uses 1-2  pills(roxy's), she used 1/2 on 03/10/15.  Pt c/o w/d sxs shakes, headache, body aches, sweats and cold chills and excessive thirst.    Axis I: Substance Induced Mood Disorder and Alcohol Use D/O; Cocaine Use D/O; Opioid Use D/O  Axis II: Deferred Axis III:  Past Medical History  Diagnosis Date  . Acid reflux   . Asthma   . History of blood transfusion   . Multiple sclerosis 2005    diagnosed in [place], failed steroids, no other medication  . Anxiety   . Gastric ulcer   . Migraine   . Chronic nausea     self reported  . Chronic diarrhea     self reported  . Chronic pain     self reported chronic pain, back pain, fibromyalgia  . Insomnia   . Polysubstance abuse   . ETOH abuse   . Delirium tremens   . Depression    Axis IV: other psychosocial or environmental problems, problems related to social environment and problems with primary support group Axis V: 31-40 impairment in reality testing  Past Medical History:  Past Medical History  Diagnosis Date  . Acid reflux   . Asthma   . History of blood transfusion   . Multiple sclerosis 2005    diagnosed in [place], failed steroids, no other medication  . Anxiety   . Gastric ulcer   . Migraine   . Chronic nausea     self reported  . Chronic diarrhea     self reported  . Chronic pain     self reported chronic pain, back pain, fibromyalgia  . Insomnia   . Polysubstance abuse   . ETOH abuse   . Delirium tremens   . Depression     Past Surgical History  Procedure Laterality Date  . Gastric bypass    .  Diagnostic laparoscopy  2003    repair of perforation at [facility] anastamosis   . Lumbar laminectomy/decompression microdiscectomy Right 04/14/2014    Procedure: LUMBAR LAMINECTOMY/DECOMPRESSION MICRODISCECTOMY LEVEL L4-5;  Surgeon: Maeola Harman, MD;  Location: [facility]UR[facility] ORS;  Service: Neurosurgery;  Laterality: Right;  LUMBAR LAMINECTOMY/DECOMPRESSION MICRODISCECTOMY LEVEL L4-5  . Laparotomy N/A 08/18/2014    Procedure: EXPLORATORY LAPAROTOMY, Patch graft of gastric perforation;  Surgeon: Abigail Miyamoto, MD;  Location: [facility] OR;  Service: General;  Laterality: N/A;    Family History:  Family History  Problem Relation Age of Onset  . Hypertension Paternal Grandfather   . Colon cancer Paternal Grandfather 74  . Asthma Paternal Grandmother   . Hypertension Paternal Grandmother   . Breast cancer Paternal Grandmother   . Cancer Paternal Grandmother     lung   . Diabetes Maternal Grandmother   . Diabetes Maternal Grandfather   . Hypertension Father   . Urinary tract infection Mother   . Stroke Mother 65    3 strokes     Social History:  reports that she has been smoking Cigarettes.  She has been smoking about 0.10 packs per  day. She has never used smokeless tobacco. She reports that she drinks about 195.6 oz of alcohol per week. She reports that she does not use illicit drugs.  Additional Social History:  Alcohol / Drug Use Pain Medications: None  Prescriptions: None  Over the Counter: None  History of alcohol / drug use?: Yes Longest period of sobriety (when/how long): Only when in detox  Negative Consequences of Use: Work / Programmer, multimedia, Personal relationships Withdrawal Symptoms: Fever / Chills, Sweats, Tremors, Other (Comment) (headache ) Substance #1 Name of Substance 1: Alcohol  1 - Age of First Use: 14 YOF  1 - Amount (size/oz): 1/2 Gal  1 - Frequency: 5 Day Binge  1 - Duration: 5 Days  1 - Last Use / Amount: 03/10/15 Substance #2 Name of Substance 2: Cocaine  2 - Age of First Use:  Teens  2 - Amount (size/oz): 1 Gram  2 - Frequency: Recreationally  2 - Duration: On-going  2 - Last Use / Amount: 03/09/15 Substance #3 Name of Substance 3: Pills  3 - Age of First Use: 20's  3 - Amount (size/oz): 1-2 5mg  pills  3 - Frequency: Daily  3 - Duration: On-going  3 - Last Use / Amount: 03/10/15  CIWA: CIWA-Ar BP: 121/76 mmHg Pulse Rate: 95 Nausea and Vomiting: 2 Tactile Disturbances: none Tremor: no tremor Auditory Disturbances: not present Paroxysmal Sweats: no sweat visible Visual Disturbances: very mild sensitivity Anxiety: no anxiety, at ease Headache, Fullness in Head: moderate Agitation: normal activity Orientation and Clouding of Sensorium: oriented and can do serial additions CIWA-Ar Total: 6 COWS:    PATIENT STRENGTHS: (choose at least two) Communication skills Motivation for treatment/growth  Allergies:  Allergies  Allergen Reactions  . Cymbalta [Duloxetine Hcl] Other (See Comments)    Made body jerk and twitch, spasms  . Dilaudid [Hydromorphone Hcl] Other (See Comments)    Anger, aggression   . Prednisone Other (See Comments)    Steroids in general    Home Medications:  (Not in a hospital admission)  OB/GYN Status:  Patient's last menstrual period was 02/08/2015.  General Assessment Data Location of Assessment[facility] ED TTS Assessment: In system Is this a Tele or Face-to-Face Assessment?: Tele Assessment Is this an Initial Assessment or a Re-assessment for this encounter?: Initial Assessment Marital status: Single Maiden name: None  Is patient pregnant?: No Pregnancy Status: No Living Arrangements: Alone Can pt return to current living arrangement?: Yes Admission Status: Voluntary Is patient capable of signing voluntary admission?: Yes Referral Source: MD Insurance type: BCBS  Medical Screening Exam [facility] Walk-in ONLY) Medical Exam completed: No Reason for MSE not completed: Other:  Crisis Care Plan Living Arrangements:  Alone Name of Psychiatrist: None  Name of Therapist: None   Education Status Is patient currently in school?: No Current Grade: None  Highest grade of school patient has completed: 12 Name of school: None  Contact person: None   Risk to self with the past 6 months Suicidal Ideation: Yes-Currently Present Has patient been a risk to self within the past 6 months prior to admission? : No Suicidal Intent: No Has patient had any suicidal intent within the past 6 months prior to admission? : No Is patient at risk for suicide?: No Suicidal Plan?: No Has patient had any suicidal plan within the past 6 months prior to admission? : No Specify Current Suicidal Plan: No plan  Access to Means: No Specify Access to Suicidal Means: None  What has been your use of  drugs/alcohol within the last 12 months?: Abusing: thc, cocaine, alcohol  Previous Attempts/Gestures: No How many times?: 0 Other Self Harm Risks: None  Triggers for Past Attempts: None known Intentional Self Injurious Behavior: None Family Suicide History: Yes (58yrs ago ) Recent stressful life event(s): Other (Comment) (Chronic SA) Persecutory voices/beliefs?: No Depression: Yes Depression Symptoms: Loss of interest in usual pleasures, Feeling worthless/self pity Substance abuse history and/or treatment for substance abuse?: Yes Suicide prevention information given to non-admitted patients: Not applicable  Risk to Others within the past 6 months Homicidal Ideation: No Does patient have any lifetime risk of violence toward others beyond the six months prior to admission? : No Thoughts of Harm to Others: No Current Homicidal Intent: No Current Homicidal Plan: No Access to Homicidal Means: No Identified Victim: None History of harm to others?: No Assessment of Violence: None Noted Violent Behavior Description: None  Does patient have access to weapons?: No Criminal Charges Pending?: No Describe Pending Criminal Charges: None   Does patient have a court date: No Court Date:  (None ) Is patient on probation?: No  Psychosis Hallucinations: None noted Delusions: None noted  Mental Status Report Appearance/Hygiene: Unremarkable Eye Contact: Good Motor Activity: Unremarkable Speech: Logical/coherent Level of Consciousness: Alert Mood: Depressed Affect: Depressed, Appropriate to circumstance Anxiety Level: None Thought Processes: Coherent, Relevant Judgement: Impaired Orientation: Person, Place, Time, Situation Obsessive Compulsive Thoughts/Behaviors: None  Cognitive Functioning Concentration: Normal Memory: Recent Intact, Remote Intact IQ: Average Insight: Poor Impulse Control: Fair Appetite: Fair Weight Loss: 0 Weight Gain: 0 Sleep: Decreased Total Hours of Sleep: 5 Vegetative Symptoms: None  ADLScreening [facility] Assessment Services) Patient's cognitive ability adequate to safely complete daily activities?: Yes Patient able to express need for assistance with ADLs?: Yes Independently performs ADLs?: Yes (appropriate for developmental age)  Prior Inpatient Therapy Prior Inpatient Therapy: Yes Prior Therapy Dates: 2015,2016 Prior Therapy Facilty/Provider(s): [facility]o [facility]  Reason for Treatment: SA, Anxiety   Prior Outpatient Therapy Prior Outpatient Therapy: No Prior Therapy Dates: None  Prior Therapy Facilty/Provider(s): None  Reason for Treatment: None  Does patient have an ACCT team?: No Does patient have Intensive In-House Services?  : No Does patient have [facility] services? : No Does patient have P4CC services?: No  ADL Screening (condition at time of admission) Patient's cognitive ability adequate to safely complete daily activities?: Yes Is the patient deaf or have difficulty hearing?: No Does the patient have difficulty seeing, even when wearing glasses/contacts?: No Does the patient have difficulty concentrating, remembering, or making decisions?: No Patient able to  express need for assistance with ADLs?: Yes Does the patient have difficulty dressing or bathing?: No Independently performs ADLs?: Yes (appropriate for developmental age) Does the patient have difficulty walking or climbing stairs?: No Weakness of Legs: None Weakness of Arms/Hands: None  Home Assistive Devices/Equipment Home Assistive Devices/Equipment: None  Therapy Consults (therapy consults require a physician order) PT Evaluation Needed: No OT Evalulation Needed: No SLP Evaluation Needed: No Abuse/Neglect Assessment (Assessment to be complete while patient is alone) Physical Abuse: Denies Verbal Abuse: Denies Sexual Abuse: Denies Exploitation of patient/patient's resources: Denies Self-Neglect: Denies Values / Beliefs Cultural Requests During Hospitalization: None Spiritual Requests During Hospitalization: None Consults Spiritual Care Consult Needed: No Social Work Consult Needed: No Merchant navy officer (For Healthcare) Does patient have an advance directive?: No Would patient like information on creating an advanced directive?: No - patient declined information    Additional Information 1:1 In Past 12 Months?: No CIRT Risk: No Elopement Risk: No  Does patient have medical clearance?: Yes     Disposition:  Disposition Initial Assessment Completed for this Encounter: Yes Disposition of Patient: Referred to, Inpatient treatment program (Per Hulan Fess, NP, meerts criteria for inpt admit ) Type of inpatient treatment program: Adult Patient referred to: Other (Comment) (Per Hulan Fess, NP meets criteria inpt admit )  Murrell Redden 03/11/2015 5:22 AM

## 2015-03-11 NOTE — Tx Team (Signed)
Initial Interdisciplinary Treatment Plan   PATIENT STRESSORS: Financial difficulties Health problems Occupational concerns Substance abuse   PATIENT STRENGTHS: Average or above average intelligence Communication skills Supportive family/friends   PROBLEM LIST: Problem List/Patient Goals Date to be addressed Date deferred Reason deferred Estimated date of resolution  Alcohol and drug relapse 03/11/2015     Depression 03/11/2015     SI 03/11/2015     Lack of coping skills 03/11/2015     Financial difficulty 03/11/2015     Noncompliance with support groups 03/11/2015       Health problems 03/11/2015                  DISCHARGE CRITERIA:  Improved stabilization in mood, thinking, and/or behavior Motivation to continue treatment in a less acute level of care Need for constant or close observation no longer present Verbal commitment to aftercare and medication compliance Withdrawal symptoms are absent or subacute and managed without 24-hour nursing intervention  PRELIMINARY DISCHARGE PLAN: Attend 12-step recovery group Return to previous living arrangement  PATIENT/FAMIILY INVOLVEMENT: This treatment plan has been presented to and reviewed with the patient, Beth [name].  The patient and family have been given the opportunity to ask questions and make suggestions.  Cranford Mon 03/11/2015, 5:41 PM

## 2015-03-11 NOTE — Progress Notes (Signed)
Pt attended AA group and was attentative and appropriate 

## 2015-03-11 NOTE — ED Notes (Signed)
Pt ambulated to restroom without distress.  

## 2015-03-11 NOTE — ED Notes (Signed)
Pt's stating appetite returning. Snacks given.

## 2015-03-12 DIAGNOSIS — F1994 Other psychoactive substance use, unspecified with psychoactive substance-induced mood disorder: Secondary | ICD-10-CM | POA: Insufficient documentation

## 2015-03-12 DIAGNOSIS — F1910 Other psychoactive substance abuse, uncomplicated: Secondary | ICD-10-CM

## 2015-03-12 DIAGNOSIS — F332 Major depressive disorder, recurrent severe without psychotic features: Secondary | ICD-10-CM | POA: Diagnosis present

## 2015-03-12 DIAGNOSIS — F1920 Other psychoactive substance dependence, uncomplicated: Secondary | ICD-10-CM | POA: Diagnosis present

## 2015-03-12 MED ORDER — GABAPENTIN 600 MG PO TABS
600.0000 mg | ORAL_TABLET | Freq: Four times a day (QID) | ORAL | Status: DC
  Administered 2015-03-12 – 2015-03-18 (×25): 600 mg via ORAL
  Filled 2015-03-12: qty 16
  Filled 2015-03-12 (×5): qty 1
  Filled 2015-03-12 (×2): qty 16
  Filled 2015-03-12 (×8): qty 1
  Filled 2015-03-12: qty 16
  Filled 2015-03-12 (×3): qty 1
  Filled 2015-03-12 (×2): qty 16
  Filled 2015-03-12: qty 1
  Filled 2015-03-12: qty 16
  Filled 2015-03-12 (×3): qty 1
  Filled 2015-03-12: qty 16
  Filled 2015-03-12 (×9): qty 1

## 2015-03-12 MED ORDER — BUSPIRONE HCL 15 MG PO TABS
7.5000 mg | ORAL_TABLET | Freq: Three times a day (TID) | ORAL | Status: DC
  Administered 2015-03-12 – 2015-03-14 (×6): 7.5 mg via ORAL
  Filled 2015-03-12: qty 1
  Filled 2015-03-12: qty 2
  Filled 2015-03-12 (×8): qty 1

## 2015-03-12 MED ORDER — KETOROLAC TROMETHAMINE 10 MG PO TABS
10.0000 mg | ORAL_TABLET | Freq: Four times a day (QID) | ORAL | Status: AC | PRN
  Administered 2015-03-13 – 2015-03-16 (×2): 10 mg via ORAL
  Filled 2015-03-12 (×2): qty 1

## 2015-03-12 MED ORDER — METHOCARBAMOL 500 MG PO TABS
500.0000 mg | ORAL_TABLET | Freq: Four times a day (QID) | ORAL | Status: DC
  Administered 2015-03-12 – 2015-03-16 (×17): 500 mg via ORAL
  Filled 2015-03-12 (×27): qty 1

## 2015-03-12 MED ORDER — SUMATRIPTAN SUCCINATE 50 MG PO TABS
50.0000 mg | ORAL_TABLET | ORAL | Status: DC | PRN
  Administered 2015-03-12 – 2015-03-18 (×10): 50 mg via ORAL
  Filled 2015-03-12 (×3): qty 1
  Filled 2015-03-12: qty 2
  Filled 2015-03-12 (×7): qty 1

## 2015-03-12 MED ORDER — NORTRIPTYLINE HCL 25 MG PO CAPS
75.0000 mg | ORAL_CAPSULE | Freq: Every day | ORAL | Status: DC
  Administered 2015-03-12 – 2015-03-13 (×2): 75 mg via ORAL
  Filled 2015-03-12 (×3): qty 3

## 2015-03-12 MED ORDER — NICOTINE POLACRILEX 2 MG MT GUM
2.0000 mg | CHEWING_GUM | OROMUCOSAL | Status: DC | PRN
  Administered 2015-03-12 – 2015-03-18 (×18): 2 mg via ORAL
  Filled 2015-03-12 (×4): qty 1

## 2015-03-12 MED ORDER — TRAZODONE HCL 100 MG PO TABS
100.0000 mg | ORAL_TABLET | Freq: Every evening | ORAL | Status: DC | PRN
  Administered 2015-03-13: 100 mg via ORAL
  Filled 2015-03-12 (×2): qty 1

## 2015-03-12 NOTE — Progress Notes (Signed)
D: Pt's mood is depressed. Eye contact is fair. She rates her depression 8/10 and hopelessness 6/10. Pt denies SI/HI but endorses seeing spiders and hearing music.  A: Support given. Verbalization encouraged. Pt encouraged to come to staff for any concerns. Medications given as prescribed. R: Pt is receptive. Q15 min safety checks maintained. Pt remains safe on the unit. Will continue to monitor.

## 2015-03-12 NOTE — BHH Suicide Risk Assessment (Signed)
[facility] Admission Suicide Risk Assessment   Nursing information obtained from:    Demographic factors:    Current Mental Status:    Loss Factors:    Historical Factors:    Risk Reduction Factors:    Total Time spent with patient: 1.5 hours Principal Problem: Substance abuse Diagnosis:   Patient Active Problem List   Diagnosis Date Noted  . Substance abuse [F19.10] 03/11/2015  . 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 Alcohol use disorder, moderate, dependence [F10.20] 01/13/2015  . Free intraperitoneal air [K66.8] 08/18/2014  . Perforated ulcer [K27.5] 08/18/2014  . 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 Acid reflux [K21.9]   . History of blood transfusion [Z92.89]   . Multiple sclerosis [G35] 03/13/2012  . H/O gastric bypass [Z98.84] 03/13/2012     Continued Clinical Symptoms:  Alcohol Use Disorder Identification Test Final Score (AUDIT): 26 The "Alcohol Use Disorders Identification Test", Guidelines for Use in Primary Care, Second Edition.  World Science writer [facility]). Score between 0-7:  no or low risk or alcohol related problems. Score between 8-15:  moderate risk of alcohol related problems. Score between 16-19:  high risk of alcohol related problems. Score 20 or above:  warrants further diagnostic evaluation for alcohol dependence and treatment.   CLINICAL FACTORS:   Depression:   Anhedonia Hopelessness Impulsivity Dysthymia Alcohol/Substance Abuse/Dependencies Unstable or Poor Therapeutic Relationship Previous Psychiatric Diagnoses and Treatments   Musculoskeletal: Strength & Muscle Tone: within normal limits Gait & Station: normal Patient leans: no lean  Psychiatric Specialty Exam: Physical Exam  Constitutional: She appears well-developed and well-nourished.  Skin: She is not diaphoretic.    Review of Systems  Constitutional: Negative.    Neurological: Negative for tremors.  Psychiatric/Behavioral: Positive for depression and substance abuse. The patient is nervous/anxious.     Blood pressure 113/91, pulse 102, temperature 98.1 F (36.7 C), temperature source Oral, resp. rate 19, height 5' 5.5" (1.664 m), weight 108.863 kg (240 lb), last menstrual period 02/08/2015.Body mass index is 39.32 kg/(m^2).  General Appearance: Casual  Eye Contact::  Fair  Speech:  Slow  Volume:  Decreased  Mood:  Depressed and Dysphoric  Affect:  Congruent  Thought Process:  Coherent  Orientation:  Full (Time, Place, and Person)  Thought Content:  Rumination  Suicidal Thoughts:  No  Homicidal Thoughts:  No  Memory:  Immediate;   Fair Recent;   Fair  Judgement:  Poor  Insight:  Shallow  Psychomotor Activity:  Decreased  Concentration:  Fair  Recall:  [employer] of Knowledge:Fair  Language: Fair  Akathisia:  Negative  Handed:  Right  AIMS (if indicated):     Assets:  Desire for Improvement Vocational/Educational  Sleep:     Cognition: WNL  ADL's:  Intact     COGNITIVE FEATURES THAT CONTRIBUTE TO RISK:  Closed-mindedness and Polarized thinking    SUICIDE RISK:   Moderate:  Frequent suicidal ideation with limited intensity, and duration, some specificity in terms of plans, no associated intent, good self-control, limited dysphoria/symptomatology, some risk factors present, and identifiable protective factors, including available and accessible social support.  PLAN OF CARE: Admit for stabilization. Adjustment of medications for depression. Monitor vitals and detox accordingly.  Medical Decision Making:  Review and summation of old records (2), Established Problem, Worsening (2), Review of Last Therapy Session (1), Review of Medication Regimen & Side Effects (2) and Review of New Medication or Change in Dosage (2)  I certify that inpatient services furnished can reasonably be expected to improve the patient's condition.   Beth [name],  Beth [name] 03/12/2015, 11:46 AM
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
## 2015-03-12 NOTE — [facility] Group Notes (Signed)
[facility] Group Notes: (Clinical Social Work)   03/12/2015      Type of Therapy:  Group Therapy   Participation Level:  Did Not Attend despite MHT prompting   Ambrose Mantle, LCSW 03/12/2015, 12:38 PM

## 2015-03-12 NOTE — [facility] Group Notes (Signed)
[facility] Group Notes:  (Nursing/MHT/Case Management/Adjunct)  Date:  03/12/2015  Time:  11:52 AM  Type of Therapy:  Psychoeducational Skills  Participation Level:  Minimal  Participation Quality:  Attentive  Affect:  Appropriate  Cognitive:  Appropriate  Insight:  Improving  Engagement in Group:  Limited  Modes of Intervention:  Discussion, Education and Support  Summary of Progress/Problems: Pt stated that her goal for today was to "not go crazy". Pt irritable in group and minimally participated.   Leda Quail T 03/12/2015, 11:52 AM

## 2015-03-12 NOTE — H&P (Addendum)
Psychiatric Admission Assessment Adult  Patient Identification: Beth [name] MRN:  [id number] Date of Evaluation:  03/12/2015 Chief Complaint:  SUBSTANCE INDUCED MOOD DISORDER Principal Diagnosis: Substance abuse Diagnosis:   Patient Active Problem List   Diagnosis Date Noted  . Substance abuse [F19.10] 03/11/2015  . 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 Alcohol use disorder, moderate, dependence [F10.20] 01/13/2015  . Free intraperitoneal air [K66.8] 08/18/2014  . Perforated ulcer [K27.5] 08/18/2014  . 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 Acid reflux [K21.9]   . History of blood transfusion [Z92.89]   . Multiple sclerosis [G35] 03/13/2012  . H/O gastric bypass [Z98.84] 03/13/2012   History of Present Illness:  Per tele ass't, Beth [name] is a 34 y.o. female who presents to [facility] for alcohol, cocaine and opiate detox and SI w/no plan or intent to harm herself. Pt reports that she went on a 5 day binge and drank 1/2 gal of alcohol, her last drink was 03/10/15, she had 2 shots before coming to the [employer]. Pt also uses cocaine, recreationally, stating that she uses 1 gram of cocaine with friends. Pt also uses 1-2 6m pills (roxy's), she used 1/2 on 03/10/15. Pt c/o w/d sxs shakes, headache, body aches, sweats and cold chills and excessive thirst.   Today, she was seen.  She is pleasant but somewhat anxious.  Discussed in length her medications and plan of care.  Per KDaleen [name] she was unable to get in touch with her sponsor and when an ex-BF called her, she started to drink.  She states she went into the ED with her sponsor and was encouraged to provide staff with substances she had been using.  Beth [name] remorseful at this new turn of events, "I did so well with treatment right after I left here."  She is here for detox and will resume a program once her crisis had been  managed and stabilized.   Beth [name] here as a patient in 12/2014.    Elements:  Location:  substance abuse. Quality:  feeling axious and helpless. Timing:  in the last few days. Context:  she was unable to get into contact with her sponsor. Associated Signs/Symptoms: Depression Symptoms:  depressed mood, suicidal thoughts without plan, anxiety, disturbed sleep, (Hypo) Manic Symptoms:  Labiality of Mood, Anxiety Symptoms:  Excessive Worry, Social Anxiety, Psychotic Symptoms:  Hallucinations: Auditory PTSD Symptoms: NA Total Time spent with patient: 45 minutes  Past Medical History:  Past Medical History  Diagnosis Date  . Acid reflux   . Asthma   . History of blood transfusion   . [place] 2005    [place] other [place]  . Anxiety   . Gastric ulcer   . Migraine   . Chronic nausea     self reported  . Chronic diarrhea     self reported  . Chronic pain     self reported chronic pain, back pain, fibromyalgia  . Insomnia   . Polysubstance abuse   . ETOH abuse   . Delirium tremens   . Depression     Past Surgical History  Procedure Laterality Date  . Gastric bypass    . Diagnostic laparoscopy  2003    [facility]   . Lumbar laminectomy/decompression microdiscectomy Right 04/14/2014    Procedure: LUMBAR LAMINECTOMY/DECOMPRESSION MICRODISCECTOMY LEVEL L4-5;  Surgeon: JErline Levine MD;  [place]  [facility];  Service: Neurosurgery;  Laterality: Right;  LUMBAR LAMINECTOMY/DECOMPRESSION MICRODISCECTOMY LEVEL L4-5  . Laparotomy N/A 08/18/2014    Procedure: EXPLORATORY LAPAROTOMY, Patch graft of gastric perforation;  Surgeon: Coralie Keens, MD;  Location: [place] [facility];  Service: General;  Laterality: N/A;   Family History:  Family History  Problem Relation Age of Onset  . Hypertension Paternal Grandfather   . Colon cancer Paternal Grandfather 101  . Asthma Paternal Grandmother   . Hypertension Paternal  Grandmother   . Breast cancer Paternal Grandmother   . Cancer Paternal Grandmother     lung   . Diabetes Maternal Grandmother   . Diabetes Maternal Grandfather   . Hypertension Father   . Urinary tract infection Mother   . Stroke Mother 58    3 strokes    Social History:  History  Alcohol Use  . 195.6 oz/week  . 25 Cans of beer, 301 Shots of liquor per week    Comment: Says she can drink up to a 1/2 gallon/day     History  Drug Use No    Comment: cocaine-none >1 year.  meth-none >10 years.    History   Social History  . Marital Status: Single    Spouse Name: N/A  . Number of Children: N/A  . Years of Education: N/A   Social History Main Topics  . Smoking status: Current Some Day Smoker -- 0.10 packs/day    Types: Cigarettes  . Smokeless tobacco: Never Used  . Alcohol Use: 195.6 oz/week    25 Cans of beer, 301 Shots of liquor per week     Comment: Says she can drink up to a 1/2 gallon/day  . Drug Use: No     Comment: cocaine-none >1 year.  meth-none >10 years.  . Sexual Activity: Yes    Birth Control/ Protection: None   Other Topics Concern  . None   Social History Narrative   Additional Social History:     Musculoskeletal: Strength & Muscle Tone: within normal limits Gait & Station: normal Patient leans: N/A  Psychiatric Specialty Exam: Physical Exam  ROS  Blood pressure 110/79, pulse 103, temperature 98.1 F (36.7 C), temperature source Oral, resp. rate 19, height 5' 5.5" (1.664 m), weight 108.863 kg (240 lb), last menstrual period 02/08/2015.Body mass index is 39.32 kg/(m^2).  General Appearance: Fairly Groomed  Engineer, water::  Good  Speech:  Normal Rate  Volume:  Normal  Mood:  Anxious and Depressed  Affect:  Depressed and Flat  Thought Process:  Intact  Orientation:  Full (Time, Place, and Person)  Thought Content:  Rumination  Suicidal Thoughts:  No  Homicidal Thoughts:  No  Memory:  Immediate;   Fair Recent;   Fair Remote;   Fair   Judgement:  Fair  Insight:  Fair  Psychomotor Activity:  Normal  Concentration:  Good  Recall:  Good  Fund of Knowledge:Good  Language: Good  Akathisia:  Negative  Handed:  Right  AIMS (if indicated):     Assets:  Desire for Improvement Physical Health Resilience Social Support  ADL's:  Intact  Cognition: WNL  Sleep:      Risk to Self: Is patient at risk for suicide?: Yes Risk to Others:   Prior Inpatient Therapy:   Prior Outpatient Therapy:    Alcohol Screening: 1. How often do you have a drink containing alcohol?: 4 or more times a week 2. How many drinks containing alcohol do you have on a typical day when you are drinking?:  10 or more 3. How often do you have six or more drinks on one occasion?: Monthly Preliminary Score: 6 4. How often during the last year have you found that you were not able to stop drinking once you had started?: Weekly 5. How often during the last year have you failed to do what was normally expected from you becasue of drinking?: Monthly 6. How often during the last year have you needed a first drink in the morning to get yourself going after a heavy drinking session?: Monthly 7. How often during the last year have you had a feeling of guilt of remorse after drinking?: Daily or almost daily 8. How often during the last year have you been unable to remember what happened the night before because you had been drinking?: Less than monthly 9. Have you or someone else been injured as a result of your drinking?: No 10. Has a relative or friend or a doctor or another health worker been concerned about your drinking or suggested you cut down?: Yes, during the last year Alcohol Use Disorder Identification Test Final Score (AUDIT): 26 Brief Intervention: MD notified of score 20 or above  Allergies:   Allergies  Allergen Reactions  . Cymbalta [Duloxetine Hcl] Other (See Comments)    Made body jerk and twitch, spasms  . Dilaudid [Hydromorphone Hcl] Other  (See Comments)    Anger, aggression   . Prednisone Other (See Comments)    Steroids in general   Lab Results:  Results for orders placed or performed during the hospital encounter of 03/10/15 (from the past 48 hour(s))  Acetaminophen level     Status: Abnormal   Collection Time: 03/10/15 10:12 PM  Result Value Ref Range   Acetaminophen (Tylenol), Serum <10 (L) 10 - 30 ug/mL    Comment:        THERAPEUTIC CONCENTRATIONS VARY SIGNIFICANTLY. A RANGE OF 10-30 ug/mL MAY BE AN EFFECTIVE CONCENTRATION FOR MANY PATIENTS. HOWEVER, SOME ARE BEST TREATED AT CONCENTRATIONS OUTSIDE THIS RANGE. ACETAMINOPHEN CONCENTRATIONS >150 ug/mL AT 4 HOURS AFTER INGESTION AND >50 ug/mL AT 12 HOURS AFTER INGESTION ARE OFTEN ASSOCIATED WITH TOXIC REACTIONS.   CBC     Status: Abnormal   Collection Time: 03/10/15 10:12 PM  Result Value Ref Range   WBC 12.9 (H) 4.0 - 10.5 K/uL   RBC 5.69 (H) 3.87 - 5.11 MIL/uL   Hemoglobin 13.6 12.0 - 15.0 g/dL   HCT 42.1 36.0 - 46.0 %   MCV 74.0 (L) 78.0 - 100.0 fL   MCH 23.9 (L) 26.0 - 34.0 pg   MCHC 32.3 30.0 - 36.0 g/dL   RDW 21.8 (H) 11.5 - 15.5 %   Platelets 440 (H) 150 - 400 K/uL  Comprehensive metabolic panel     Status: Abnormal   Collection Time: 03/10/15 10:12 PM  Result Value Ref Range   Sodium 138 135 - 145 mmol/L   Potassium 3.7 3.5 - 5.1 mmol/L   Chloride 99 (L) 101 - 111 mmol/L   CO2 23 22 - 32 mmol/L   Glucose, Bld 107 (H) 65 - 99 mg/dL   BUN 8 6 - 20 mg/dL   Creatinine, Ser 0.88 0.44 - 1.00 mg/dL   Calcium 9.1 8.9 - 10.3 mg/dL   Total Protein 7.2 6.5 - 8.1 g/dL   Albumin 3.7 3.5 - 5.0 g/dL   AST 36 15 - 41 U/L   ALT 34 14 - 54 U/L   Alkaline Phosphatase 97 38 - 126 U/L  Total Bilirubin 0.5 0.3 - 1.2 mg/dL   GFR calc non Af Amer >60 >60 mL/min   GFR calc Af Amer >60 >60 mL/min    Comment: (NOTE) The eGFR has been calculated using the CKD EPI equation. This calculation has not been validated in all clinical situations. eGFR's  persistently <60 mL/min signify possible Chronic Kidney Disease.    Anion gap 16 (H) 5 - 15  Ethanol (ETOH)     Status: Abnormal   Collection Time: 03/10/15 10:12 PM  Result Value Ref Range   Alcohol, Ethyl (B) 288 (H) <5 mg/dL    Comment:        LOWEST DETECTABLE LIMIT FOR SERUM ALCOHOL IS 11 mg/dL FOR MEDICAL PURPOSES ONLY   Salicylate level     Status: None   Collection Time: 03/10/15 10:12 PM  Result Value Ref Range   Salicylate Lvl <1.6 2.8 - 30.0 mg/dL  I-stat troponin, ED     Status: None   Collection Time: 03/10/15 11:16 PM  Result Value Ref Range   Troponin i, poc 0.00 0.00 - 0.08 ng/mL   Comment 3            Comment: Due to the release kinetics of cTnI, a negative result within the first hours of the onset of symptoms does not rule out myocardial infarction with certainty. If myocardial infarction is still suspected, repeat the test at appropriate intervals.   Urine Drug Screen     Status: Abnormal   Collection Time: 03/11/15  2:34 AM  Result Value Ref Range   Opiates NONE DETECTED NONE DETECTED   Cocaine POSITIVE (A) NONE DETECTED   Benzodiazepines POSITIVE (A) NONE DETECTED   Amphetamines NONE DETECTED NONE DETECTED   Tetrahydrocannabinol NONE DETECTED NONE DETECTED   Barbiturates NONE DETECTED NONE DETECTED    Comment:        DRUG SCREEN FOR MEDICAL PURPOSES ONLY.  IF CONFIRMATION IS NEEDED FOR ANY PURPOSE, NOTIFY LAB WITHIN 5 DAYS.        LOWEST DETECTABLE LIMITS FOR URINE DRUG SCREEN Drug Class       Cutoff (ng/mL) Amphetamine      1000 Barbiturate      200 Benzodiazepine   109 Tricyclics       604 Opiates          300 Cocaine          300 THC              50   POC Urine Pregnancy, (if pre-menopausal female)  not at [facility]     Status: None   Collection Time: 03/11/15  2:34 AM  Result Value Ref Range   Preg Test, Ur NEGATIVE NEGATIVE    Comment:        THE SENSITIVITY OF THIS METHODOLOGY IS >24 mIU/mL   Urinalysis, Routine w reflex microscopic      Status: Abnormal   Collection Time: 03/11/15  2:34 AM  Result Value Ref Range   Color, Urine AMBER (A) YELLOW    Comment: BIOCHEMICALS MAY BE AFFECTED BY COLOR   APPearance CLOUDY (A) CLEAR   Specific Gravity, Urine 1.027 1.005 - 1.030   pH 5.0 5.0 - 8.0   Glucose, UA NEGATIVE NEGATIVE mg/dL   Hgb urine dipstick NEGATIVE NEGATIVE   Bilirubin Urine SMALL (A) NEGATIVE   Ketones, ur 15 (A) NEGATIVE mg/dL   Protein, ur NEGATIVE NEGATIVE mg/dL   Urobilinogen, UA 0.2 0.0 - 1.0 mg/dL   Nitrite NEGATIVE NEGATIVE  Leukocytes, UA NEGATIVE NEGATIVE    Comment: MICROSCOPIC NOT DONE ON URINES WITH NEGATIVE PROTEIN, BLOOD, LEUKOCYTES, NITRITE, OR GLUCOSE <1000 mg/dL.   Current Medications: Current Facility-Administered Medications  Medication Dose Route Frequency Provider Last Rate Last Dose  . acetaminophen (TYLENOL) tablet 650 mg  650 mg Oral Q6H PRN Kerrie Buffalo, NP   650 mg at 03/12/15 0803  . alum & mag hydroxide-simeth (MAALOX/MYLANTA) 200-200-20 MG/5ML suspension 30 mL  30 mL Oral Q4H PRN Kerrie Buffalo, NP      . busPIRone (BUSPAR) tablet 7.5 mg  7.5 mg Oral TID Kerrie Buffalo, NP      . chlordiazePOXIDE (LIBRIUM) capsule 25 mg  25 mg Oral Q6H PRN Kerrie Buffalo, NP      . chlordiazePOXIDE (LIBRIUM) capsule 25 mg  25 mg Oral QID Kerrie Buffalo, NP   25 mg at 03/12/15 0803   Followed by  . [START ON 03/13/2015] chlordiazePOXIDE (LIBRIUM) capsule 25 mg  25 mg Oral TID Kerrie Buffalo, NP       Followed by  . [START ON 03/14/2015] chlordiazePOXIDE (LIBRIUM) capsule 25 mg  25 mg Oral BH-qamhs Kerrie Buffalo, NP       Followed by  . [START ON 03/15/2015] chlordiazePOXIDE (LIBRIUM) capsule 25 mg  25 mg Oral Daily Kerrie Buffalo, NP      . gabapentin (NEURONTIN) tablet 600 mg  600 mg Oral QID Kerrie Buffalo, NP      . hydrOXYzine (ATARAX/VISTARIL) tablet 25 mg  25 mg Oral Q6H PRN Kerrie Buffalo, NP   25 mg at 03/11/15 2115  . ketorolac (TORADOL) tablet 20 mg  20 mg Oral Q6H PRN Kerrie Buffalo, NP      . loperamide (IMODIUM) capsule 2-4 mg  2-4 mg Oral PRN Kerrie Buffalo, NP      . magnesium hydroxide (MILK OF MAGNESIA) suspension 30 mL  30 mL Oral Daily PRN Kerrie Buffalo, NP      . methocarbamol (ROBAXIN) tablet 500 mg  500 mg Oral QID Kerrie Buffalo, NP      . multivitamin with minerals tablet 1 tablet  1 tablet Oral Daily Kerrie Buffalo, NP   1 tablet at 03/12/15 0803  . nicotine polacrilex (NICORETTE) gum 2 mg  2 mg Oral PRN Kerrie Buffalo, NP      . nortriptyline (PAMELOR) capsule 75 mg  75 mg Oral QHS Kerrie Buffalo, NP      . ondansetron (ZOFRAN-ODT) disintegrating tablet 4 mg  4 mg Oral Q6H PRN Kerrie Buffalo, NP      . SUMAtriptan (IMITREX) tablet 50 mg  50 mg Oral Q2H PRN Kerrie Buffalo, NP      . thiamine (VITAMIN B-1) tablet 100 mg  100 mg Oral Daily Kerrie Buffalo, NP   100 mg at 03/12/15 0803  . traZODone (DESYREL) tablet 100 mg  100 mg Oral QHS PRN Kerrie Buffalo, NP       PTA Medications: Prescriptions prior to admission  Medication Sig Dispense Refill Last Dose  . acetaminophen (TYLENOL) 650 MG CR tablet Take 2,600 mg by mouth once.   01/12/2015 at Unknown time  . amoxicillin (AMOXIL) 500 MG capsule Take 1 capsule (500 mg total) by mouth every 8 (eight) hours. For bacterial infection 12 capsule 0   . busPIRone (BUSPAR) 5 MG tablet Take 1 tablet (5 mg total) by mouth 2 (two) times daily. For anxiety 60 tablet 0   . diclofenac sodium (VOLTAREN) 1 % GEL Apply 4 g topically 4 (four) times daily. For  pain 1 Tube 0   . gabapentin (NEURONTIN) 300 MG capsule Take 1 capsule (300 mg total) by mouth 4 (four) times daily. For agitation 120 capsule 0   . hydrOXYzine (ATARAX/VISTARIL) 25 MG tablet Take 1 tablet (25 mg total) by mouth 3 (three) times daily as needed for anxiety. 90 tablet 0   . lidocaine (LIDODERM) 5 % Place 1 patch onto the skin daily. For pain.  Remove & Discard patch within 12 hours or as directed by MD 30 patch 0   . methocarbamol (ROBAXIN) 500 MG  tablet Take 2 tablets (1,000 mg total) by mouth every 8 (eight) hours as needed for muscle spasms. (Patient not taking: Reported on 01/12/2015) 30 tablet 0 Not Taking at Unknown time  . neomycin-bacitracin-polymyxin (NEOSPORIN) OINT Apply 1 application topically 2 (two) times daily. For bacterial infection 1 Tube 0   . nortriptyline (PAMELOR) 75 MG capsule Take 1 capsule (75 mg total) by mouth at bedtime. For depression 30 capsule 0   . promethazine (PHENERGAN) 25 MG tablet Take 25 mg by mouth every 6 (six) hours as needed.  0   . SUMAtriptan (IMITREX) 50 MG tablet Take 1 tablet (50 mg total) by mouth every 2 (two) hours as needed for migraine or headache. May repeat in 2 hours if headache persists or recurs. 10 tablet 0   . traZODone (DESYREL) 150 MG tablet Take 1 tablet (150 mg total) by mouth at bedtime as needed for sleep. 30 tablet 0     Previous Psychotropic Medications: Yes   Substance Abuse History in the last 12 months:  Yes.    Consequences of Substance Abuse: Medical Consequences:  hospital admissions  Results for orders placed or performed during the hospital encounter of 03/10/15 (from the past 72 hour(s))  Acetaminophen level     Status: Abnormal   Collection Time: 03/10/15 10:12 PM  Result Value Ref Range   Acetaminophen (Tylenol), Serum <10 (L) 10 - 30 ug/mL    Comment:        THERAPEUTIC CONCENTRATIONS VARY SIGNIFICANTLY. A RANGE OF 10-30 ug/mL MAY BE AN EFFECTIVE CONCENTRATION FOR MANY PATIENTS. HOWEVER, SOME ARE BEST TREATED AT CONCENTRATIONS OUTSIDE THIS RANGE. ACETAMINOPHEN CONCENTRATIONS >150 ug/mL AT 4 HOURS AFTER INGESTION AND >50 ug/mL AT 12 HOURS AFTER INGESTION ARE OFTEN ASSOCIATED WITH TOXIC REACTIONS.   CBC     Status: Abnormal   Collection Time: 03/10/15 10:12 PM  Result Value Ref Range   WBC 12.9 (H) 4.0 - 10.5 K/uL   RBC 5.69 (H) 3.87 - 5.11 MIL/uL   Hemoglobin 13.6 12.0 - 15.0 g/dL   HCT 42.1 36.0 - 46.0 %   MCV 74.0 (L) 78.0 - 100.0 fL   MCH  23.9 (L) 26.0 - 34.0 pg   MCHC 32.3 30.0 - 36.0 g/dL   RDW 21.8 (H) 11.5 - 15.5 %   Platelets 440 (H) 150 - 400 K/uL  Comprehensive metabolic panel     Status: Abnormal   Collection Time: 03/10/15 10:12 PM  Result Value Ref Range   Sodium 138 135 - 145 mmol/L   Potassium 3.7 3.5 - 5.1 mmol/L   Chloride 99 (L) 101 - 111 mmol/L   CO2 23 22 - 32 mmol/L   Glucose, Bld 107 (H) 65 - 99 mg/dL   BUN 8 6 - 20 mg/dL   Creatinine, Ser 0.88 0.44 - 1.00 mg/dL   Calcium 9.1 8.9 - 10.3 mg/dL   Total Protein 7.2 6.5 - 8.1 g/dL   Albumin  3.7 3.5 - 5.0 g/dL   AST 36 15 - 41 U/L   ALT 34 14 - 54 U/L   Alkaline Phosphatase 97 38 - 126 U/L   Total Bilirubin 0.5 0.3 - 1.2 mg/dL   GFR calc non Af Amer >60 >60 mL/min   GFR calc Af Amer >60 >60 mL/min    Comment: (NOTE) The eGFR has been calculated using the CKD EPI equation. This calculation has not been validated in all clinical situations. eGFR's persistently <60 mL/min signify possible Chronic Kidney Disease.    Anion gap 16 (H) 5 - 15  Ethanol (ETOH)     Status: Abnormal   Collection Time: 03/10/15 10:12 PM  Result Value Ref Range   Alcohol, Ethyl (B) 288 (H) <5 mg/dL    Comment:        LOWEST DETECTABLE LIMIT FOR SERUM ALCOHOL IS 11 mg/dL FOR MEDICAL PURPOSES ONLY   Salicylate level     Status: None   Collection Time: 03/10/15 10:12 PM  Result Value Ref Range   Salicylate Lvl <6.0 2.8 - 30.0 mg/dL  I-stat troponin, ED     Status: None   Collection Time: 03/10/15 11:16 PM  Result Value Ref Range   Troponin i, poc 0.00 0.00 - 0.08 ng/mL   Comment 3            Comment: Due to the release kinetics of cTnI, a negative result within the first hours of the onset of symptoms does not rule out myocardial infarction with certainty. If myocardial infarction is still suspected, repeat the test at appropriate intervals.   Urine Drug Screen     Status: Abnormal   Collection Time: 03/11/15  2:34 AM  Result Value Ref Range   Opiates NONE  DETECTED NONE DETECTED   Cocaine POSITIVE (A) NONE DETECTED   Benzodiazepines POSITIVE (A) NONE DETECTED   Amphetamines NONE DETECTED NONE DETECTED   Tetrahydrocannabinol NONE DETECTED NONE DETECTED   Barbiturates NONE DETECTED NONE DETECTED    Comment:        DRUG SCREEN FOR MEDICAL PURPOSES ONLY.  IF CONFIRMATION IS NEEDED FOR ANY PURPOSE, NOTIFY LAB WITHIN 5 DAYS.        LOWEST DETECTABLE LIMITS FOR URINE DRUG SCREEN Drug Class       Cutoff (ng/mL) Amphetamine      1000 Barbiturate      200 Benzodiazepine   109 Tricyclics       323 Opiates          300 Cocaine          300 THC              50   POC Urine Pregnancy, (if pre-menopausal female)  not at [facility]     Status: None   Collection Time: 03/11/15  2:34 AM  Result Value Ref Range   Preg Test, Ur NEGATIVE NEGATIVE    Comment:        THE SENSITIVITY OF THIS METHODOLOGY IS >24 mIU/mL   Urinalysis, Routine w reflex microscopic     Status: Abnormal   Collection Time: 03/11/15  2:34 AM  Result Value Ref Range   Color, Urine AMBER (A) YELLOW    Comment: BIOCHEMICALS MAY BE AFFECTED BY COLOR   APPearance CLOUDY (A) CLEAR   Specific Gravity, Urine 1.027 1.005 - 1.030   pH 5.0 5.0 - 8.0   Glucose, UA NEGATIVE NEGATIVE mg/dL   Hgb urine dipstick NEGATIVE NEGATIVE   Bilirubin Urine SMALL (A)  NEGATIVE   Ketones, ur 15 (A) NEGATIVE mg/dL   Protein, ur NEGATIVE NEGATIVE mg/dL   Urobilinogen, UA 0.2 0.0 - 1.0 mg/dL   Nitrite NEGATIVE NEGATIVE   Leukocytes, UA NEGATIVE NEGATIVE    Comment: MICROSCOPIC NOT DONE ON URINES WITH NEGATIVE PROTEIN, BLOOD, LEUKOCYTES, NITRITE, OR GLUCOSE <1000 mg/dL.    Observation Level/Precautions:  15 minute checks  Laboratory:  per ED  Psychotherapy:  Group  Medications:  As per medlist  Consultations:  As needed  Discharge Concerns:  Safety  Estimated LOS:  2-7 days  Other:     Psychological Evaluations: Yes   Treatment Plan Summary: Admit for crisis management and mood  stabilization. Medication management to re-stabilize current mood symptoms.  Resumed previos home meds for depression and mult. sclerosis Group counseling sessions for coping skills Medical consults as needed Review and reinstate any pertinent home medications for other health problems   Medical Decision Making:  Review of Psycho-Social Stressors (1), Discuss test with performing physician (1), Review and summation of old records (2), Review of Medication Regimen & Side Effects (2) and Review of New Medication or Change in Dosage (2)  I certify that inpatient services furnished can reasonably be expected to improve the patient's condition.   Freda Munro May Agustin AGNP-BC 5/14/201610:14 AM I have examined the patient and agreed with the findings of H&P and treatment plan. I have also done suicide assessment on this patient.

## 2015-03-13 DIAGNOSIS — F332 Major depressive disorder, recurrent severe without psychotic features: Principal | ICD-10-CM

## 2015-03-13 MED ORDER — OLANZAPINE 5 MG PO TBDP
5.0000 mg | ORAL_TABLET | Freq: Three times a day (TID) | ORAL | Status: DC | PRN
  Administered 2015-03-13: 5 mg via ORAL
  Filled 2015-03-13: qty 1

## 2015-03-13 NOTE — [facility] Suicide Risk Assessment (Signed)
[facility] INPATIENT:  Family/Significant Other Suicide Prevention Education  Suicide Prevention Education:  Contact Attempts: Mother & Stepfather Fulton Mole & Myles Gip)  [phone] has been identified by the patient as the family member/significant other with whom the patient will be residing, and identified as the person(s) who will aid the patient in the event of a mental health crisis.  With written consent from the patient, two attempts were made to provide suicide prevention education, prior to and/or following the patient's discharge.  We were unsuccessful in providing suicide prevention education.  A suicide education pamphlet was given to the patient to share with family/significant other.  Date and time of first attempt:  03/13/15 at 9:45am, no message left  Sarina Ser 03/13/2015, 9:46 AM   CSW spoke with pt's mother, Fulton Mole. SPE completed with pt's mother and she verbalized understanding of all information. Pt does not have access to firearms/guns.  Trula Slade, LCSWA Clinical Social Worker 03/15/2015 2:39 PM

## 2015-03-13 NOTE — Progress Notes (Signed)
Patient did attend the evening speaker AA meeting.  

## 2015-03-13 NOTE — Progress Notes (Signed)
[facility] MD Progress Note  03/13/2015 2:46 PM Beth [name]  MRN:  [id number] Subjective:   Patient states "I was at [facility]. I relapsed three weeks later. I drank with my boyfriend. This time around it's really rough. I went on a bad five day bender. Drank a gallon of vodka a day, used xanax and cocaine. I feel very out of control. My mood is labile and tearful. I have crazy dreams. My mood flips and I feel full of rage. Also I started seeing some spiders crawling on the floor at times. I am so afraid of spiders. I feel so bad about relapsing."   Objective:  Patient is seen and chart is reviewed. She is assessed in her room today as she had left group early. Patient appears very anxious, tearful, and agitated. The patient endorses use of multiple substances prior to admission. Her urine drug screen was positive for benzos/cocaine. Her alcohol level was 288. Patient denies any symptoms of psychosis, which she is describing today prior to onset of withdrawal. Patient is also reporting hearing her name being called on occasion with nobody being there as a stimulus. The patient is upset because she feels that the withdrawal is much worse this time. A manual pulse taken today was within normal limits.  She seems especially upset about her subjective report of visual hallucinations that she reports frequently seeing. Patient appears to have poor insight as to how her recent polysubstance use is affecting her mental health negatively. She is very tearful during the follow up assessment today.   Principal Problem: Substance abuse Diagnosis:   Patient Active Problem List   Diagnosis Date Noted  . Major depressive disorder, recurrent, severe without psychotic features [F33.2]   . Polysubstance dependence [F19.20]   . Substance induced mood disorder [F19.94]   . Substance abuse [F19.10] 03/11/2015  . 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 Alcohol use disorder, moderate, dependence [F10.20] 01/13/2015  . Free intraperitoneal air [K66.8] 08/18/2014  . Perforated ulcer [K27.5] 08/18/2014  . 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 Acid reflux [K21.9]   . History of blood transfusion [Z92.89]   . Multiple sclerosis [G35] 03/13/2012  . H/O gastric bypass [Z98.84] 03/13/2012   Total Time spent with patient: 20 minutes  Past Medical History:  Past Medical History  Diagnosis Date  . Acid reflux   . Asthma   . History of blood transfusion   . Multiple sclerosis 2005    diagnosed in [place], failed steroids, no other medication  . Anxiety   . Gastric ulcer   . Migraine   . Chronic nausea     self reported  . Chronic diarrhea     self reported  . Chronic pain     self reported chronic pain, back pain, fibromyalgia  . Insomnia   . Polysubstance abuse   . ETOH abuse   . Delirium tremens   . Depression     Past Surgical History  Procedure Laterality Date  . Gastric bypass    . Diagnostic laparoscopy  2003    repair of perforation at [facility] anastamosis   . Lumbar laminectomy/decompression microdiscectomy Right 04/14/2014    Procedure: LUMBAR LAMINECTOMY/DECOMPRESSION MICRODISCECTOMY LEVEL L4-5;  Surgeon: Maeola Harman, MD;  Location: [facility];  Service: Neurosurgery;  Laterality: Right;  LUMBAR LAMINECTOMY/DECOMPRESSION MICRODISCECTOMY LEVEL L4-5  . Laparotomy N/A 08/18/2014    Procedure: EXPLORATORY LAPAROTOMY, Patch graft of  gastric perforation;  Surgeon: Abigail Miyamoto, MD;  Location: [facility] OR;  Service: General;  Laterality: N/A;   Family History:  Family History  Problem Relation Age of Onset  . Hypertension Paternal Grandfather   . Colon cancer Paternal Grandfather 82  . Asthma Paternal Grandmother   . Hypertension Paternal Grandmother   . Breast cancer Paternal Grandmother   . Cancer Paternal Grandmother     lung   . Diabetes Maternal Grandmother   . Diabetes  Maternal Grandfather   . Hypertension Father   . Urinary tract infection Mother   . Stroke Mother 82    3 strokes    Social History:  History  Alcohol Use  . 195.6 oz/week  . 25 Cans of beer, 301 Shots of liquor per week    Comment: Says she can drink up to a 1/2 gallon/day     History  Drug Use No    Comment: cocaine-none >1 year.  meth-none >10 years.    History   Social History  . Marital Status: Single    Spouse Name: N/A  . Number of Children: N/A  . Years of Education: N/A   Social History Main Topics  . Smoking status: Current Some Day Smoker -- 0.10 packs/day    Types: Cigarettes  . Smokeless tobacco: Never Used  . Alcohol Use: 195.6 oz/week    25 Cans of beer, 301 Shots of liquor per week     Comment: Says she can drink up to a 1/2 gallon/day  . Drug Use: No     Comment: cocaine-none >1 year.  meth-none >10 years.  . Sexual Activity: Yes    Birth Control/ Protection: None   Other Topics Concern  . None   Social History Narrative   Additional History:    Sleep: Poor  Appetite:  Poor  Assessment: Beth [name] is a 34 y.o. female who presented to [facility] for alcohol, cocaine and opiate detox and SI w/no plan or intent to harm herself. Patient reports that she went on a five day binge and drank 1/2 gal of alcohol, her last drink was 03/10/15, she had two shots before coming to the [employer]. Pt also uses cocaine, recreationally, stating that she uses one gram of cocaine with friends. Patient also uses 1-2 5mg  pills (roxy's), she used 1/2 on 03/10/15.   Musculoskeletal: Strength & Muscle Tone: within normal limits Gait & Station: normal Patient leans: N/A  Psychiatric Specialty Exam: Physical Exam  Review of Systems  Constitutional: Positive for chills and diaphoresis.  HENT: Negative.   Eyes: Negative.   Respiratory: Negative.   Cardiovascular: Negative.   Gastrointestinal: Negative.   Genitourinary: Negative.   Musculoskeletal: Positive for  myalgias.  Neurological: Negative.   Endo/Heme/Allergies: Negative.   Psychiatric/Behavioral: Positive for depression, suicidal ideas, hallucinations and substance abuse. Negative for memory loss. The patient is nervous/anxious and has insomnia.     Blood pressure 96/62, pulse 169, temperature 97.6 F (36.4 C), temperature source Oral, resp. rate 20, height 5' 5.5" (1.664 m), weight 108.863 kg (240 lb), last menstrual period 02/08/2015.Body mass index is 39.32 kg/(m^2).  General Appearance: Fairly Groomed  Patent attorney::  Fair  Speech:  Normal Rate  Volume:  Normal  Mood:  Angry and Anxious  Affect:  Labile  Thought Process:  Goal Directed and Intact  Orientation:  Full (Time, Place, and Person)  Thought Content:  Hallucinations: Auditory Visual and Rumination  Suicidal Thoughts:  No  Homicidal Thoughts:  No  Memory:  Immediate;   Good Recent;   Fair Remote;   Fair  Judgement:  Fair  Insight:  Fair  Psychomotor Activity:  Increased  Concentration:  Fair  Recall:  Good  Fund of Knowledge:Good  Language: Good  Akathisia:  No  Handed:  Right  AIMS (if indicated):     Assets:  Communication Skills Desire for Improvement Physical Health Resilience  ADL's:  Intact  Cognition: WNL  Sleep:      Current Medications: Current Facility-Administered Medications  Medication Dose Route Frequency Provider Last Rate Last Dose  . acetaminophen (TYLENOL) tablet 650 mg  650 mg Oral Q6H PRN Adonis Brook, NP   650 mg at 03/12/15 0803  . alum & mag hydroxide-simeth (MAALOX/MYLANTA) 200-200-20 MG/5ML suspension 30 mL  30 mL Oral Q4H PRN Adonis Brook, NP      . busPIRone (BUSPAR) tablet 7.5 mg  7.5 mg Oral TID Adonis Brook, NP   7.5 mg at 03/13/15 1256  . chlordiazePOXIDE (LIBRIUM) capsule 25 mg  25 mg Oral Q6H PRN Adonis Brook, NP      . chlordiazePOXIDE (LIBRIUM) capsule 25 mg  25 mg Oral TID Adonis Brook, NP   25 mg at 03/13/15 1256   Followed by  . [START ON 03/14/2015]  chlordiazePOXIDE (LIBRIUM) capsule 25 mg  25 mg Oral BH-qamhs Adonis Brook, NP       Followed by  . [START ON 03/15/2015] chlordiazePOXIDE (LIBRIUM) capsule 25 mg  25 mg Oral Daily Adonis Brook, NP      . gabapentin (NEURONTIN) tablet 600 mg  600 mg Oral QID Adonis Brook, NP   600 mg at 03/13/15 1256  . hydrOXYzine (ATARAX/VISTARIL) tablet 25 mg  25 mg Oral Q6H PRN Adonis Brook, NP   25 mg at 03/13/15 1037  . ketorolac (TORADOL) tablet 10 mg  10 mg Oral Q6H PRN Adonis Brook, NP   10 mg at 03/13/15 1037  . loperamide (IMODIUM) capsule 2-4 mg  2-4 mg Oral PRN Adonis Brook, NP      . magnesium hydroxide (MILK OF MAGNESIA) suspension 30 mL  30 mL Oral Daily PRN Adonis Brook, NP      . methocarbamol (ROBAXIN) tablet 500 mg  500 mg Oral QID Adonis Brook, NP   500 mg at 03/13/15 1255  . multivitamin with minerals tablet 1 tablet  1 tablet Oral Daily Adonis Brook, NP   1 tablet at 03/13/15 1610  . nicotine polacrilex (NICORETTE) gum 2 mg  2 mg Oral PRN Adonis Brook, NP   2 mg at 03/13/15 1256  . nortriptyline (PAMELOR) capsule 75 mg  75 mg Oral QHS Adonis Brook, NP   75 mg at 03/12/15 2124  . ondansetron (ZOFRAN-ODT) disintegrating tablet 4 mg  4 mg Oral Q6H PRN Adonis Brook, NP      . SUMAtriptan (IMITREX) tablet 50 mg  50 mg Oral Q2H PRN Adonis Brook, NP   50 mg at 03/13/15 0825  . thiamine (VITAMIN B-1) tablet 100 mg  100 mg Oral Daily Adonis Brook, NP   100 mg at 03/13/15 0826  . traZODone (DESYREL) tablet 100 mg  100 mg Oral QHS PRN Adonis Brook, NP        Lab Results: No results found for this or any previous visit (from the past 48 hour(s)).  Physical Findings: AIMS: Facial and Oral Movements Muscles of Facial Expression: None, normal Lips and Perioral Area: None, normal Jaw: None, normal Tongue: None, normal,Extremity Movements Upper (arms, wrists, hands, fingers): None, normal  Lower (legs, knees, ankles, toes): None, normal, Trunk Movements Neck, shoulders,  hips: None, normal, Overall Severity Severity of abnormal movements (highest score from questions above): None, normal Incapacitation due to abnormal movements: None, normal Patient's awareness of abnormal movements (rate only patient's report): No Awareness, Dental Status Current problems with teeth and/or dentures?: Yes (chipped teeth) Does patient usually wear dentures?: No  CIWA:  CIWA-Ar Total: 4 COWS:     Treatment Plan Summary: Daily contact with patient to assess and evaluate symptoms and progress in treatment and Medication management  Continue crisis management and stabilization.  Medication management:  -Continue Librium protocol for alcohol/benzo detox -Continue Neurontin 600 mg QID for mood stabilization -Continue Buspar 7.5 mg TID for anxiety -Continue Pamelor 75 mg hs for depression -Start Zyprexa Zydis 5 mg every eight hours prn psychosis or agitation  Encouraged patient to attend groups and participate in group counseling sessions and activities.  Discharge plan in progress.  Continue current treatment plan.  Address health issues: Vitals stable with occasional tachycardic readings possibly related to agitation. Recent EKG on 03/11/15 NSR. Continue ultram, robaxin, toradol as ordered to address chronic pain from MS.   Medical Decision Making:  Review of Psycho-Social Stressors (1), Review or order clinical lab tests (1), Established Problem, Worsening (2), Review of Medication Regimen & Side Effects (2) and Review of New Medication or Change in Dosage (2)  Jalien Weakland NP-C 03/13/2015, 2:46 PM

## 2015-03-13 NOTE — [facility] Group Notes (Signed)
[facility] Group Notes:  (Nursing/MHT/Case Management/Adjunct)  Date:  03/13/2015  Time:  11:56 AM  Type of Therapy:  Psychoeducational Skills  Participation Level:  Active  Participation Quality:  Attentive  Affect:  Appropriate  Cognitive:  Appropriate  Insight:  Improving  Engagement in Group:  Engaged  Modes of Intervention:  Discussion, Education and Support  Summary of Progress/Problems: We spoke about healthy support systems and where would you like to go and why? Pt states that she would like to go to [place] to see polar bears.   Leda Quail T 03/13/2015, 11:56 AM

## 2015-03-13 NOTE — Progress Notes (Addendum)
D: Pt's mood is depressed and anxious. Pt in her room, very tearful, hyperventilating. Pt states that she is having thoughts that she doesn't want to think about. She also states that she has a migraine and is feeling anxious. Pt endorses passive SI but contracts for safety. Pt also complaining of visual hallucinations of spiders and night terrors. States that her sleep was poor last night.  A: Support given. Deep breathing done with pt. PRN Medications given for pain and anxiety.  Encouraged pt to talk about something that makes her happy. Pt spoke about her 2 dogs that are a major part of her support system. Once calm, pt encouraged to rest in bed. Pt encouraged to come to staff for any concerns.  R: Pt is receptive. Q15 min safety checks maintained. Pt remains safe on the unit. Will continue to monitor.

## 2015-03-13 NOTE — Progress Notes (Signed)
Patient ID: Beth [name], female   DOB: [birth year], 34 y.o.   MRN: [id number] D: Took over patient's care @ 2300. Patient in bed sleeping. Respiration regular and unlabored. No sign of distress noted at this time A: 15 mins checks for safety. R: Patient is safe.

## 2015-03-13 NOTE — [facility] Group Notes (Signed)
[facility] Group Notes:  (Clinical Social Work)  03/13/2015  10:00-11:00AM  Summary of Progress/Problems:   The main focus of today's process group was to   1)  discuss the importance of adding supports  2)  define health supports versus unhealthy supports  3)  identify the patient's current unhealthy supports and plan how to handle them  4)  Identify the patient's current healthy supports and plan what to add.  An emphasis was placed on using counselor, doctor, therapy groups, 12-step groups, and problem-specific support groups to expand supports.    The patient refused to speak during group or share what her healthy/unhealthy supports are.  After just a few minutes, she got up and left.  Type of Therapy:  Process Group with Motivational Interviewing  Participation Level:  Minimal  Participation Quality:  Resistant  Affect:  Anxious, Flat and Irritable  Cognitive:  Alert  Insight:  Limited  Engagement in Therapy:  Limited  Modes of Intervention:   Education, Support and Processing, Activity  Ambrose Mantle, LCSW 03/13/2015

## 2015-03-14 MED ORDER — HYDROXYZINE HCL 25 MG PO TABS
25.0000 mg | ORAL_TABLET | Freq: Four times a day (QID) | ORAL | Status: DC | PRN
  Administered 2015-03-14 – 2015-03-18 (×5): 25 mg via ORAL
  Filled 2015-03-14 (×5): qty 1
  Filled 2015-03-14: qty 10

## 2015-03-14 MED ORDER — DULOXETINE HCL 20 MG PO CPEP
20.0000 mg | ORAL_CAPSULE | Freq: Every day | ORAL | Status: DC
  Administered 2015-03-14: 20 mg via ORAL
  Filled 2015-03-14 (×4): qty 1

## 2015-03-14 MED ORDER — BENZOCAINE 10 % MT GEL
Freq: Four times a day (QID) | OROMUCOSAL | Status: DC | PRN
  Administered 2015-03-14 – 2015-03-18 (×7): via OROMUCOSAL
  Filled 2015-03-14: qty 9.4

## 2015-03-14 MED ORDER — QUETIAPINE FUMARATE 100 MG PO TABS
100.0000 mg | ORAL_TABLET | Freq: Every day | ORAL | Status: DC
  Administered 2015-03-14 – 2015-03-15 (×2): 100 mg via ORAL
  Filled 2015-03-14 (×4): qty 1

## 2015-03-14 MED ORDER — LAMOTRIGINE 25 MG PO TABS
25.0000 mg | ORAL_TABLET | Freq: Every day | ORAL | Status: DC
  Administered 2015-03-14 – 2015-03-17 (×4): 25 mg via ORAL
  Filled 2015-03-14 (×6): qty 1

## 2015-03-14 MED ORDER — QUETIAPINE FUMARATE 25 MG PO TABS
25.0000 mg | ORAL_TABLET | Freq: Three times a day (TID) | ORAL | Status: DC
  Administered 2015-03-14 – 2015-03-15 (×3): 25 mg via ORAL
  Filled 2015-03-14 (×6): qty 1

## 2015-03-14 NOTE — Progress Notes (Signed)
[facility] MD Progress Note  03/14/2015 5:25 PM Beth [name]  MRN:  [id number] Subjective:  Beth [name] is having a very hard time. States she feels anxious depressed agitated. She is dealing with a lot of shame and guilt for her relapse. She had been taking medications that she does not know if they are working or not. She admits to a lot of mood swings. Admits she does not know what is going on with her. She has been on Prozac, Zoloft, Paxil, Celexa, Lexapro, Wellbutrin, Depakote Zyprexa Risperdal. She is currently on Buspar 7.5 mg TID, Pamelor 75 mg HS.  Principal Problem: Substance abuse Diagnosis:   Patient Active Problem List   Diagnosis Date Noted  . Major depressive disorder, recurrent, severe without psychotic features [F33.2]   . Polysubstance dependence [F19.20]   . Substance induced mood disorder [F19.94]   . Substance abuse [F19.10] 03/11/2015  . 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 Alcohol use disorder, moderate, dependence [F10.20] 01/13/2015  . Free intraperitoneal air [K66.8] 08/18/2014  . Perforated ulcer [K27.5] 08/18/2014  . 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 Acid reflux [K21.9]   . History of blood transfusion [Z92.89]   . Multiple sclerosis [G35] 03/13/2012  . H/O gastric bypass [Z98.84] 03/13/2012   Total Time spent with patient: 30 minutes   Past Medical History:  Past Medical History  Diagnosis Date  . Acid reflux   . Asthma   . History of blood transfusion   . Multiple sclerosis 2005    diagnosed in [place], failed steroids, no other medication  . Anxiety   . Gastric ulcer   . Migraine   . Chronic nausea     self reported  . Chronic diarrhea     self reported  . Chronic pain     self reported chronic pain, back pain, fibromyalgia  . Insomnia   . Polysubstance abuse   . ETOH abuse   . Delirium tremens   . Depression     Past Surgical History   Procedure Laterality Date  . Gastric bypass    . Diagnostic laparoscopy  2003    repair of perforation at [facility] anastamosis   . Lumbar laminectomy/decompression microdiscectomy Right 04/14/2014    Procedure: LUMBAR LAMINECTOMY/DECOMPRESSION MICRODISCECTOMY LEVEL L4-5;  Surgeon: Maeola Harman, MD;  Location: [facility];  Service: Neurosurgery;  Laterality: Right;  LUMBAR LAMINECTOMY/DECOMPRESSION MICRODISCECTOMY LEVEL L4-5  . Laparotomy N/A 08/18/2014    Procedure: EXPLORATORY LAPAROTOMY, Patch graft of gastric perforation;  Surgeon: Abigail Miyamoto, MD;  Location: [facility] OR;  Service: General;  Laterality: N/A;   Family History:  Family History  Problem Relation Age of Onset  . Hypertension Paternal Grandfather   . Colon cancer Paternal Grandfather 66  . Asthma Paternal Grandmother   . Hypertension Paternal Grandmother   . Breast cancer Paternal Grandmother   . Cancer Paternal Grandmother     lung   . Diabetes Maternal Grandmother   . Diabetes Maternal Grandfather   . Hypertension Father   . Urinary tract infection Mother   . Stroke Mother 30    3 strokes    Social History:  History  Alcohol Use  . 195.6 oz/week  . 25 Cans of beer, 301 Shots of liquor per week    Comment: Says she can drink up to a 1/2 gallon/day     History  Drug Use No    Comment: cocaine-none >1 year.  meth-none >  10 years.    History   Social History  . Marital Status: Single    Spouse Name: N/A  . Number of Children: N/A  . Years of Education: N/A   Social History Main Topics  . Smoking status: Current Some Day Smoker -- 0.10 packs/day    Types: Cigarettes  . Smokeless tobacco: Never Used  . Alcohol Use: 195.6 oz/week    25 Cans of beer, 301 Shots of liquor per week     Comment: Says she can drink up to a 1/2 gallon/day  . Drug Use: No     Comment: cocaine-none >1 year.  meth-none >10 years.  . Sexual Activity: Yes    Birth Control/ Protection: None   Other Topics Concern  . None   Social  History Narrative   Additional History:    Sleep: Poor  Appetite:  Poor   Assessment:   Musculoskeletal: Strength & Muscle Tone: within normal limits Gait & Station: normal Patient leans: N/A   Psychiatric Specialty Exam: Physical Exam  Review of Systems  Constitutional: Negative.   HENT: Negative.   Eyes: Negative.   Respiratory: Negative.   Cardiovascular: Negative.   Gastrointestinal: Negative.   Genitourinary: Negative.   Musculoskeletal: Positive for back pain.  Skin: Negative.   Neurological: Negative.   Endo/Heme/Allergies: Negative.   Psychiatric/Behavioral: Positive for depression and substance abuse. The patient is nervous/anxious.     Blood pressure 130/77, pulse 83, temperature 97.5 F (36.4 C), temperature source Oral, resp. rate 16, height 5' 5.5" (1.664 m), weight 108.863 kg (240 lb), last menstrual period 02/08/2015.Body mass index is 39.32 kg/(m^2).  General Appearance: Fairly Groomed  Patent attorney::  Fair  Speech:  Clear and Coherent  Volume:  fluctuates  Mood:  Anxious, Depressed and worried  Affect:  anxious worried tearful  Thought Process:  Coherent and Goal Directed  Orientation:  Full (Time, Place, and Person)  Thought Content:  symptoms events worries concerns  Suicidal Thoughts:  No  Homicidal Thoughts:  No  Memory:  Immediate;   Fair Recent;   Fair Remote;   Fair  Judgement:  Fair  Insight:  Present  Psychomotor Activity:  Restlessness  Concentration:  Fair  Recall:  [employer] of Knowledge:Fair  Language: Fair  Akathisia:  No  Handed:  Right  AIMS (if indicated):     Assets:  Desire for Improvement  ADL's:  Intact  Cognition: WNL  Sleep:        Current Medications: Current Facility-Administered Medications  Medication Dose Route Frequency Provider Last Rate Last Dose  . acetaminophen (TYLENOL) tablet 650 mg  650 mg Oral Q6H PRN Adonis Brook, NP   650 mg at 03/12/15 0803  . alum & mag hydroxide-simeth (MAALOX/MYLANTA)  200-200-20 MG/5ML suspension 30 mL  30 mL Oral Q4H PRN Adonis Brook, NP      . benzocaine (ORAJEL) 10 % mucosal gel   Mouth/Throat QID PRN Rachael Fee, MD      . chlordiazePOXIDE (LIBRIUM) capsule 25 mg  25 mg Oral BH-qamhs Adonis Brook, NP   25 mg at 03/14/15 0804   Followed by  . [START ON 03/15/2015] chlordiazePOXIDE (LIBRIUM) capsule 25 mg  25 mg Oral Daily Adonis Brook, NP      . DULoxetine (CYMBALTA) DR capsule 20 mg  20 mg Oral Daily Rachael Fee, MD   20 mg at 03/14/15 1105  . gabapentin (NEURONTIN) tablet 600 mg  600 mg Oral QID Adonis Brook, NP   600  mg at 03/14/15 1715  . ketorolac (TORADOL) tablet 10 mg  10 mg Oral Q6H PRN Adonis Brook, NP   10 mg at 03/13/15 1037  . lamoTRIgine (LAMICTAL) tablet 25 mg  25 mg Oral Daily Rachael Fee, MD   25 mg at 03/14/15 1105  . magnesium hydroxide (MILK OF MAGNESIA) suspension 30 mL  30 mL Oral Daily PRN Adonis Brook, NP      . methocarbamol (ROBAXIN) tablet 500 mg  500 mg Oral QID Adonis Brook, NP   500 mg at 03/14/15 1714  . multivitamin with minerals tablet 1 tablet  1 tablet Oral Daily Adonis Brook, NP   1 tablet at 03/14/15 0803  . nicotine polacrilex (NICORETTE) gum 2 mg  2 mg Oral PRN Adonis Brook, NP   2 mg at 03/14/15 1300  . QUEtiapine (SEROQUEL) tablet 100 mg  100 mg Oral QHS Rachael Fee, MD      . QUEtiapine (SEROQUEL) tablet 25 mg  25 mg Oral TID Rachael Fee, MD   25 mg at 03/14/15 1714  . SUMAtriptan (IMITREX) tablet 50 mg  50 mg Oral Q2H PRN Adonis Brook, NP   50 mg at 03/14/15 0804  . thiamine (VITAMIN B-1) tablet 100 mg  100 mg Oral Daily Adonis Brook, NP   100 mg at 03/14/15 0804  . traZODone (DESYREL) tablet 100 mg  100 mg Oral QHS PRN Adonis Brook, NP   100 mg at 03/13/15 2228    Lab Results: No results found for this or any previous visit (from the past 48 hour(s)).  Physical Findings: AIMS: Facial and Oral Movements Muscles of Facial Expression: None, normal Lips and Perioral Area: None,  normal Jaw: None, normal Tongue: None, normal,Extremity Movements Upper (arms, wrists, hands, fingers): None, normal Lower (legs, knees, ankles, toes): None, normal, Trunk Movements Neck, shoulders, hips: None, normal, Overall Severity Severity of abnormal movements (highest score from questions above): None, normal Incapacitation due to abnormal movements: None, normal Patient's awareness of abnormal movements (rate only patient's report): No Awareness, Dental Status Current problems with teeth and/or dentures?: Yes (chipped teeth) Does patient usually wear dentures?: No  CIWA:  CIWA-Ar Total: 7 COWS:     Treatment Plan Summary: Daily contact with patient to assess and evaluate symptoms and progress in treatment and Medication management Supportive approach/coping skills Alcohol dependence; continue librium detox protocol/work a relapse prevention plan Alcohol cravings; was on Campral before but could not afford to stay on it. Will explore other viable options for alcohol cravings Anxiety/agitation; will D/C the Buspar as well as the Pamelor. Will have a trial with Seroquel 25 mg Insomnia; will D/C the Pamelor and try Seroquel 100 mg at HS Mood instability; will start Lamictal 25 mg daily with plans to increase to 50 mg ( possibility of skin rash discussed) Back pain; will use Cymbalta what can help with the pain the anxiety and the depression. Will start with a low dose; 20 mg daily Medical Decision Making:  Review of Psycho-Social Stressors (1), Review or order clinical lab tests (1), Review of Medication Regimen & Side Effects (2) and Review of New Medication or Change in Dosage (2)     Kasyn Stouffer A 03/14/2015, 5:25 PM

## 2015-03-14 NOTE — Progress Notes (Signed)
Recreation Therapy Notes  Date: 03/14/15 Time: 9:30am Location: [address] Dayroom  Group Topic: Stress Management  Goal Area(s) Addresses:  Patient will verbalize importance of using healthy stress management.  Patient will identify positive emotions associated with healthy stress management.   Behavioral Response: None   Intervention: Stress Management  Activity :  Progressive Muscle Relaxation.  LRT introduced and educated patients on progressive muscle relaxation.  A script was used to deliver the technique to patients.  Patients were asked to follow the script read aloud by LRT to engage in practicing the stress management technique.  Education:  Stress Management, Discharge Planning.   Education Outcome: Acknowledges edcuation/In group clarification offered/Needs additional education  Clinical Observations/Feedback: Patient did not attend group.  Caroll Rancher, LRT/CTRS         Caroll Rancher A 03/14/2015 4:32 PM

## 2015-03-14 NOTE — [facility] Group Notes (Signed)
[facility] LCSW Aftercare Discharge Planning Group Note   03/14/2015 10:19 AM  Participation Quality:  Appropriate   Mood/Affect:  Depressed and Tearful  Depression Rating:  9  Anxiety Rating:  10  Thoughts of Suicide:  No Will you contract for safety?   NA  Current AVH:  Yes  Plan for Discharge/Comments:  Pt reports that she relapsed on alcohol "about 8 days ago" and stopped taking her mental health meds 8 days ago. Pt reports that she had been doing well (going to [employer] and [facility] through [name]). Pt reports that she would like referral to [facility] through [facility] Daneil Dolin) and has AA sponsor who she has been in touch with since admission to [facility] Pt tearful, reports AVH at times and racing thoughts. She is able to return home with her mother at d/c.   Transportation Means: mother   Supports: mother   Smart, Lebron Quam

## 2015-03-14 NOTE — Progress Notes (Addendum)
Writer witnessed this incident at approximately 2105. During the AA speaker meeting pt left group to request medication, in which nurse had to call and request orders from the provider. Pt then returned to group and sat in the corner rocking back and forth. After group was over, pt approached nurse again about medications, nurse stated she was finishing something up and would be with her in just a moment. Pt became upset when she was not able to get her medications immediately when she wanted them and stormed off and went into the dayroom stating "She's gonna see what a bitch is. Don't want to give me my fucking meds." Pt asked to speak to the charge nurse to request a different nurse. Pt sat in a chair and began rocking back and forth, breathing heavy and appeared to start crying, however there were no tears. Nurse notified pt to come to the medication window. Another pt stated "she's having a panic attack. She can't walk." Nurse then came into the dayroom where pt was sitting and administered medication. Pt was able to talk with staff stating she was having a panic attack. Vitals were taken and recorded in Doc Flowsheets. Heart rate was within normal limits.

## 2015-03-14 NOTE — Tx Team (Signed)
  Interdisciplinary Treatment Plan Update (Adult)  Date:  03/14/2015  Time Reviewed:  8:41 AM   Progress in Treatment: Attending groups: Yes. Participating in groups:  Yes. Taking medication as prescribed:  Yes. Tolerating medication:  Yes. Family/Significant othe contact made:  Not yet. SPE required with pt's mother.  Patient understands diagnosis:  Yes. and As evidenced by:  seeing treatment for ETOH detox, medication stabilization, and bipolar Sx. Discussing patient identified problems/goals with staff:  Yes. Medical problems stabilized or resolved:  Yes. Denies suicidal/homicidal ideation: Yes. Issues/concerns per patient self-inventory: continued AVH and racing thoughts.  Other:  Discharge Plan or Barriers: Pt requested referral to [facility] through [facility] and states that she has a psychiatrist that she would like to continue seeing but does not remember who this is--pt will provide CSW with this information this afternoon.   Reason for Continuation of Hospitalization: Anxiety Depression Hallucinations Medication stabilization Withdrawal symptoms  Comments:  Beth [name] is a 34 y.o. female who presents to [facility] for alcohol, cocaine and opiate detox and SI w/no plan or intent to harm herself. Pt reports that she went on a 5 day binge and drank 1/2 gal of alcohol, her last drink was 03/10/15, she had 2 shots before coming to the [employer]. Pt also uses cocaine, recreationally, stating that she uses 1 gram of cocaine with friends. Pt also uses 1-2 5mg  pills (roxy's), she used 1/2 on 03/10/15. Pt c/o w/d sxs shakes, headache, body aches, sweats and cold chills and excessive thirst. Per Dot Lanes, she was unable to get in touch with her sponsor and when an ex-BF called her, she started to drink. She states she went into the ED with her sponsor and was encouraged to provide staff with substances she had been using. [name] is remorseful at this new turn of events, "I did so well with treatment  right after I left here." She is here for detox and will resume a program once her crisis had been managed and stabilized.   Estimated length of stay:  3-5 days   New goal(s):   Additional Comments:  Patient and CSW reviewed pt's identified goals and treatment plan. Patient verbalized understanding and agreed to treatment plan. CSW reviewed [facility] "Discharge Process and Patient Involvement" Form. Pt verbalized understanding of information provided and signed form.   Attendees: Patient:   03/14/2015 8:41 AM   Family:   03/14/2015 8:41 AM   Physician:  Dr. Geoffery Lyons, MD 03/14/2015 8:41 AM   Nursing:   Laverle Patter RN; [facility] RN 03/14/2015 8:41 AM   Clinical Social Worker: Trula Slade, LCSWA  03/14/2015 8:41 AM   Clinical Social Worker: Belenda Cruise Drinkard LCSWA 03/14/2015 8:41 AM   Other:  Darden Dates Nurse Case Manager 03/14/2015 8:41 AM   Other:  Leisa Lenz; [facility]  03/14/2015 8:41 AM   Other:   03/14/2015 8:41 AM   Other:  03/14/2015 8:41 AM   Other:  03/14/2015 8:41 AM   Other:  03/14/2015 8:41 AM    03/14/2015 8:41 AM    03/14/2015 8:41 AM    03/14/2015 8:41 AM    03/14/2015 8:41 AM    Scribe for Treatment Team:   Trula Slade, LCSWA  03/14/2015 8:41 AM

## 2015-03-14 NOTE — Progress Notes (Signed)
Pt attended AA speaker meeting. Pt was initially in group then left out to talk to the nurse about medications. When pt returned she sat in the corner and had her head in her hands and was rocking back and forth. Pt did not appear to be engaged.

## 2015-03-14 NOTE — [facility] Group Notes (Signed)
[facility] LCSW Group Therapy  03/14/2015 1:12 PM  Type of Therapy:  Group Therapy  Participation Level:  Active  Participation Quality:  Attentive  Affect:  Appropriate  Cognitive:  Alert and Oriented  Insight:  Improving  Engagement in Therapy:  Improving  Modes of Intervention:  Confrontation, Discussion, Education, Exploration, Problem-solving, Rapport Building, Socialization and Support  Summary of Progress/Problems: Today's Topic: Overcoming Obstacles. Patients identified one short term goal and potential obstacles in reaching this goal. Patients processed barriers involved in overcoming these obstacles. Patients identified steps necessary for overcoming these obstacles and explored motivation (internal and external) for facing these difficulties head on. Beth [name] was attentive and engaged during today's processing group. She shared that her biggest obstacle involves "learning not to punish myself by stopping my medication. When I feel guilty or hurt someone emotionally, I feel so bad that I stop taking my meds because i don't think that I deserve to feel good." Beth [name] shared that she has supportive parents and a great AA sponsor that are helping her work through this and hold her accountable for taking her medications. Beth [name] shows progress in the group setting and improving insight AEB her ability to identify triggers "ex boyfriend is a major one," and people that are positive supports for her.   Beth [name], Beth [name] LCSWA  03/14/2015, 1:12 PM

## 2015-03-14 NOTE — Progress Notes (Signed)
D: Pt's mood is anxious. Pt states that she did not sleep well. Rates her depression 9/10 and anxiety 10/10. Pt states that she has problems with anger, irritability, racing thoughts, and nightmares. Pt endorses AVH.  A: Support given. Verbalization encouraged. Pt encouraged to come to staff for any concerns. Medications given as prescribed. R: Pt is receptive. No complaints of pain or discomfort at this time. Q15 min safety checks maintained. Pt remains safe on the unit. Will continue to monitor.

## 2015-03-14 NOTE — Progress Notes (Signed)
Patient ID: Beth [name], female   DOB: 02-13-1981, 34 y.o.   MRN: [id number] D: Patient c/o of increase anxiety and AV hallucination seeing spiders and hearing someone call her name. Pt reports she has gotten close to her roommate and saddened about her leaving tomorrow. Pt reports roommate leaving feels like a loss since she has had so many disappointments and losses in her life. Pt attended evening AA group.    A: Met with pt 1:1. Medications administered as prescribed. Writer encouraged pt to discuss feelings. Pt encouraged to come to staff with any questions or concerns.   R: Patient is safe on the unit. She is complaint with medications and denies any adverse reaction. Pt endorses passive suicidal ideation and  contract to come to staff.

## 2015-03-14 NOTE — Progress Notes (Signed)
Patient ID: Beth [name], female   DOB: 11-10-1980, 34 y.o.   MRN: [id number] D: Client  Is visible on the unit, interacting in the day room and coloring. Client reports day been "up and down" "they changed some of my medications, we waiting to see how they going to work" "feeling a little anxious, I been coloring to keep my mind from different thoughts" Client also reports deep breathing has helped with the anxiety. "I see spiders sometimes like they coming at me" "I hear voices sometimes like people calling my name" A: Writer introduced self to client, encouraged her to continue to use coping skills and notify staff of any concerns. Staff will monitor q48min for safety. R: Client is safe on the unit, attended group.

## 2015-03-14 NOTE — Plan of Care (Signed)
Problem: Diagnosis: Increased Risk For Suicide Attempt Goal: STG-Patient Will Attend All Groups On The Unit Outcome: Progressing Pt attended evening AA group     

## 2015-03-15 DIAGNOSIS — F1920 Other psychoactive substance dependence, uncomplicated: Secondary | ICD-10-CM

## 2015-03-15 MED ORDER — ONDANSETRON 4 MG PO TBDP
4.0000 mg | ORAL_TABLET | Freq: Four times a day (QID) | ORAL | Status: DC | PRN
  Administered 2015-03-15 – 2015-03-17 (×3): 4 mg via ORAL
  Filled 2015-03-15 (×3): qty 1

## 2015-03-15 MED ORDER — QUETIAPINE FUMARATE 50 MG PO TABS
50.0000 mg | ORAL_TABLET | Freq: Three times a day (TID) | ORAL | Status: DC
Start: 2015-03-15 — End: 2015-03-16
  Administered 2015-03-15 – 2015-03-16 (×4): 50 mg via ORAL
  Filled 2015-03-15 (×10): qty 1

## 2015-03-15 NOTE — [facility] Group Notes (Signed)
[facility] LCSW Group Therapy  03/15/2015 1:28 PM  Type of Therapy:  Group Therapy  Participation Level:  Active  Participation Quality:  Attentive  Affect:  Depressed  Cognitive:  Oriented  Insight:  Limited  Engagement in Therapy:  Limited  Modes of Intervention:  Discussion, Education, Exploration, Problem-solving, Rapport Building, Socialization and Support  Summary of Progress/Problems: MHA Speaker came to talk about his personal journey with substance abuse and addiction. The pt processed ways by which to relate to the speaker. MHA speaker provided handouts and educational information pertaining to groups and services offered by the [facility].   Smart, Montasia Chisenhall  LCSWA   03/15/2015, 1:28 PM

## 2015-03-15 NOTE — Progress Notes (Signed)
Patient ID: Beth [name], female   DOB: 1980-12-26, 35 y.o.   MRN: [id number] Client stepped out of group to request Vistaril. Writer noted that none was available, but would contact on call provider for an order. Client proceeded to go back in the group. Notified on call provider, received order for Vistaril 25 mg. Client came to nursing station requesting medication, writer was finishing up safety checks and told client she would be with her in a minute. Client became irate, stormed into the day room cursing "I'll show that bitch" and saying other unknown innuendos, client then began a pseudo panic attack, during which time she was able to give instructions to another client about how to direct her calls "tell them I'm having a panic attack right now" Writer went to day room to administer medication and instruct her to deep breathe. Client calmed down and then requested to see the charge nurse with much persistence from another client, requested another nurse. Writer maintained assignment with this client and administered night medications with no complications.

## 2015-03-15 NOTE — [facility] Group Notes (Signed)
[facility] Group Notes:  (Nursing/MHT/Case Management/Adjunct)  Date:  03/15/2015  Time:  0900  Type of Therapy:  Nurse Education  Participation Level:  Active  Participation Quality:  Appropriate  Affect:  Appropriate  Cognitive:  Alert, Appropriate and Oriented  Insight:  Appropriate  Engagement in Group:  Engaged  Modes of Intervention:  Discussion, Education and Support  Summary of Progress/Problems: Sujeily was appropriate and provided feedback/support to peers. She set a goal of trying to control her anxiety and mood swings. She was encouraged to come to staff for support/needs.   Maurine Simmering 03/15/2015, 10:37 AM

## 2015-03-15 NOTE — Plan of Care (Signed)
Problem: Alteration in mood; excessive anxiety as evidenced by: Goal: STG-Pt can identify coping skills to manage panic/anxiety (Patient can identify at least ____ coping skills to manage panic/anxiety attack) Outcome: Progressing Client identifies 2 coping skills to manage anxiety/panic attacks "I color to keep my mind from different thoughts" as evidenced by using deep breathing and medication to help with panic attack this evening.

## 2015-03-15 NOTE — Progress Notes (Signed)
D: Beth [name] was calm and appropriate on unit this a.m., but became more anxious in the afternoon, indicating at about 1610 that she felt a panic attack coming on. She was upset because another patient was discharged and left before she got a chance to say goodbye. She said she worried about the patient and the possibility that he will return to using. She denies SI/HI but reports still hearing voices. She contracts for safety. She reports fair sleep and appetite. She rates depression 7/10, hopelessness 5/10, and anxiety 8/10.  A: Meds given as ordered, including PRN Imitrex for a HA, Vistaril for anxiety, and nicotine gum for cravings.  Q15 safety checks maintained. Support/encouragement provided, particularly with regard to anxiety. R: Pt remains safe and proceeds with treatment. Will continue to monitor for needs/safety.

## 2015-03-15 NOTE — Progress Notes (Signed)
Recreation Therapy Notes  Animal-Assisted Activity (AAA) Program Checklist/Progress Notes Patient Eligibility Criteria Checklist & Daily Group note for Rec Tx Intervention  Date: 03/15/15 Time: 2:30pm Location: [address]   AAA/T Program Assumption of Risk Form signed by Patient/ or Parent Legal Guardian YES   Patient is free of allergies or sever asthma YES   Patient reports no fear of animals YES  Patient reports no history of cruelty to animals YES   Patient understands his/her participation is voluntary YES   Patient washes hands before animal contact YES   Patient washes hands after animal contact YES  Behavioral Response: Appropriate, engaged  Education: Charity fundraiser, Appropriate Animal Interaction   Education Outcome: Acknowledges understanding/In group clarification offered/Needs additional education.   Clinical Observations/Feedback: Patient arrived at 3:06pm.  Patient sat on the floor and pet the dog.  Caroll Rancher, LRT, CTRS         Lillia Abed, Odai Wimmer A 03/15/2015 4:18 PM

## 2015-03-15 NOTE — Progress Notes (Signed)
[facility] MD Progress Note  03/15/2015 6:39 PM Beth [name]  MRN:  [id number] Subjective:  Beth [name] states that she had a bad panic attack last night. Shares how she felt that she could have been help earlier when she went to her nurse the first time. Endorsed frustration with the time it took to finally  get a Vistaril. She has tolerated the other medications well so far. She felt she slept better last night. Today has felt her mood to be more stable.  Principal Problem: Major depressive disorder, recurrent, severe without psychotic features Diagnosis:   Patient Active Problem List   Diagnosis Date Noted  . 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 Polysubstance dependence [F19.20]     Priority: High  . Alcohol use disorder, moderate, dependence [F10.20] 01/13/2015    Priority: High  . Substance induced mood disorder [F19.94]   . Substance abuse [F19.10] 03/11/2015  . 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 Free intraperitoneal air [K66.8] 08/18/2014  . Perforated ulcer [K27.5] 08/18/2014  . 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 Acid reflux [K21.9]   . History of blood transfusion [Z92.89]   . Multiple sclerosis [G35] 03/13/2012  . H/O gastric bypass [Z98.84] 03/13/2012   Total Time spent with patient: 30 minutes   Past Medical History:  Past Medical History  Diagnosis Date  . Acid reflux   . Asthma   . History of blood transfusion   . Multiple sclerosis 2005    diagnosed in [place], failed steroids, no other medication  . Anxiety   . Gastric ulcer   . Migraine   . Chronic nausea     self reported  . Chronic diarrhea     self reported  . Chronic pain     self reported chronic pain, back pain, fibromyalgia  . Insomnia   . Polysubstance abuse   . ETOH abuse   . Delirium tremens   . Depression     Past  Surgical History  Procedure Laterality Date  . Gastric bypass    . Diagnostic laparoscopy  2003    repair of perforation at GJ anastamosis   . Lumbar laminectomy/decompression microdiscectomy Right 04/14/2014    Procedure: LUMBAR LAMINECTOMY/DECOMPRESSION MICRODISCECTOMY LEVEL L4-5;  Surgeon: Maeola Harman, MD;  Location: [facility];  Service: Neurosurgery;  Laterality: Right;  LUMBAR LAMINECTOMY/DECOMPRESSION MICRODISCECTOMY LEVEL L4-5  . Laparotomy N/A 08/18/2014    Procedure: EXPLORATORY LAPAROTOMY, Patch graft of gastric perforation;  Surgeon: Abigail Miyamoto, MD;  Location: [facility] OR;  Service: General;  Laterality: N/A;   Family History:  Family History  Problem Relation Age of Onset  . Hypertension Paternal Grandfather   . Colon cancer Paternal Grandfather 2  . Asthma Paternal Grandmother   . Hypertension Paternal Grandmother   . Breast cancer Paternal Grandmother   . Cancer Paternal Grandmother     lung   . Diabetes Maternal Grandmother   . Diabetes Maternal Grandfather   . Hypertension Father   . Urinary tract infection Mother   . Stroke Mother 66    3 strokes    Social History:  History  Alcohol Use  . 195.6 oz/week  . 25 Cans of beer, 301 Shots of liquor per week    Comment: Says she can drink up to a 1/2 gallon/day     History  Drug Use No    Comment: cocaine-none >  1 year.  meth-none >10 years.    History   Social History  . Marital Status: Single    Spouse Name: N/A  . Number of Children: N/A  . Years of Education: N/A   Social History Main Topics  . Smoking status: Current Some Day Smoker -- 0.10 packs/day    Types: Cigarettes  . Smokeless tobacco: Never Used  . Alcohol Use: 195.6 oz/week    25 Cans of beer, 301 Shots of liquor per week     Comment: Says she can drink up to a 1/2 gallon/day  . Drug Use: No     Comment: cocaine-none >1 year.  meth-none >10 years.  . Sexual Activity: Yes    Birth Control/ Protection: None   Other Topics Concern  .  None   Social History Narrative   Additional History:    Sleep: improved  Appetite:  Fair   Assessment:   Musculoskeletal: Strength & Muscle Tone: within normal limits Gait & Station: normal Patient leans: normal   Psychiatric Specialty Exam: Physical Exam  Review of Systems  Constitutional: Positive for malaise/fatigue.  HENT: Negative.   Eyes: Negative.   Respiratory: Negative.   Cardiovascular: Negative.   Gastrointestinal: Negative.   Genitourinary: Negative.   Musculoskeletal: Negative.   Skin: Negative.   Neurological: Negative.   Endo/Heme/Allergies: Negative.   Psychiatric/Behavioral: Positive for depression and substance abuse. The patient is nervous/anxious.     Blood pressure 127/82, pulse 92, temperature 97.6 F (36.4 C), temperature source Oral, resp. rate 18, height 5' 5.5" (1.664 m), weight 108.863 kg (240 lb), last menstrual period 02/08/2015.Body mass index is 39.32 kg/(m^2).  General Appearance: Fairly Groomed  Patent attorney::  Fair  Speech:  Clear and Coherent  Volume:  Normal  Mood:  Anxious  Affect:  anxious worried  Thought Process:  Coherent and Goal Directed  Orientation:  Full (Time, Place, and Person)  Thought Content:  symptoms events worries concerns  Suicidal Thoughts:  No  Homicidal Thoughts:  No  Memory:  Immediate;   Fair Recent fair remote fair  Judgement:  Intact  Insight:  Present and Shallow  Psychomotor Activity:  Restlessness  Concentration:  Fair  Recall:  [employer] of Knowledge:Fair  Language: Fair  Akathisia:  No  Handed:  Right  AIMS (if indicated):     Assets:  Desire for Improvement Housing Social Support  ADL's:  Intact  Cognition: WNL  Sleep:  Number of Hours: 6     Current Medications: Current Facility-Administered Medications  Medication Dose Route Frequency Provider Last Rate Last Dose  . acetaminophen (TYLENOL) tablet 650 mg  650 mg Oral Q6H PRN Adonis Brook, NP   650 mg at 03/12/15 0803  . alum  & mag hydroxide-simeth (MAALOX/MYLANTA) 200-200-20 MG/5ML suspension 30 mL  30 mL Oral Q4H PRN Adonis Brook, NP      . benzocaine (ORAJEL) 10 % mucosal gel   Mouth/Throat QID PRN Rachael Fee, MD      . gabapentin (NEURONTIN) tablet 600 mg  600 mg Oral QID Adonis Brook, NP   600 mg at 03/15/15 1620  . hydrOXYzine (ATARAX/VISTARIL) tablet 25 mg  25 mg Oral Q6H PRN Kerry Hough, PA-C   25 mg at 03/15/15 1613  . ketorolac (TORADOL) tablet 10 mg  10 mg Oral Q6H PRN Adonis Brook, NP   10 mg at 03/13/15 1037  . lamoTRIgine (LAMICTAL) tablet 25 mg  25 mg Oral Daily Rachael Fee, MD   [phone]  mg at 03/15/15 0743  . magnesium hydroxide (MILK OF MAGNESIA) suspension 30 mL  30 mL Oral Daily PRN Adonis Brook, NP      . methocarbamol (ROBAXIN) tablet 500 mg  500 mg Oral QID Adonis Brook, NP   500 mg at 03/15/15 1620  . multivitamin with minerals tablet 1 tablet  1 tablet Oral Daily Adonis Brook, NP   1 tablet at 03/15/15 0743  . nicotine polacrilex (NICORETTE) gum 2 mg  2 mg Oral PRN Adonis Brook, NP   2 mg at 03/15/15 1544  . ondansetron (ZOFRAN-ODT) disintegrating tablet 4 mg  4 mg Oral Q6H PRN Rachael Fee, MD   4 mg at 03/15/15 1836  . QUEtiapine (SEROQUEL) tablet 100 mg  100 mg Oral QHS Rachael Fee, MD   100 mg at 03/14/15 2200  . QUEtiapine (SEROQUEL) tablet 50 mg  50 mg Oral TID Rachael Fee, MD   50 mg at 03/15/15 1620  . SUMAtriptan (IMITREX) tablet 50 mg  50 mg Oral Q2H PRN Adonis Brook, NP   50 mg at 03/15/15 0744  . thiamine (VITAMIN B-1) tablet 100 mg  100 mg Oral Daily Adonis Brook, NP   100 mg at 03/15/15 0743  . traZODone (DESYREL) tablet 100 mg  100 mg Oral QHS PRN Adonis Brook, NP   100 mg at 03/13/15 2228    Lab Results: No results found for this or any previous visit (from the past 48 hour(s)).  Physical Findings: AIMS: Facial and Oral Movements Muscles of Facial Expression: None, normal Lips and Perioral Area: None, normal Jaw: None, normal Tongue: None,  normal,Extremity Movements Upper (arms, wrists, hands, fingers): None, normal Lower (legs, knees, ankles, toes): None, normal, Trunk Movements Neck, shoulders, hips: None, normal, Overall Severity Severity of abnormal movements (highest score from questions above): None, normal Incapacitation due to abnormal movements: None, normal Patient's awareness of abnormal movements (rate only patient's report): No Awareness, Dental Status Current problems with teeth and/or dentures?: Yes ("holes in teeth") Does patient usually wear dentures?: No  CIWA:  CIWA-Ar Total: 4 COWS:     Treatment Plan Summary: Daily contact with patient to assess and evaluate symptoms and progress in treatment and Medication management Supportive approach/coping skills Alcohol dependence; detox protocol completed/ will work a relapse prevention plan Major Depression; will use the Lamictal at 25 mg daily with plans to increase before D/C Cymbalta was D/C due to information in the chart with side effects to it Anxiety/agitation; will work with the Seroquel adjust dose to optimize response Insomnia' will use the higher dose of Seroquel with the Trazodone  CBT/mindfulness to address the depression the anxiety Medical Decision Making:  Review of Psycho-Social Stressors (1), Review of Medication Regimen & Side Effects (2) and Review of New Medication or Change in Dosage (2)     Marlon Vonruden A 03/15/2015, 6:39 PM

## 2015-03-16 MED ORDER — QUETIAPINE FUMARATE 200 MG PO TABS
200.0000 mg | ORAL_TABLET | Freq: Every day | ORAL | Status: DC
Start: 2015-03-16 — End: 2015-03-18
  Administered 2015-03-16 – 2015-03-17 (×2): 200 mg via ORAL
  Filled 2015-03-16: qty 4
  Filled 2015-03-16 (×2): qty 1
  Filled 2015-03-16: qty 4
  Filled 2015-03-16: qty 1

## 2015-03-16 MED ORDER — METHOCARBAMOL 750 MG PO TABS
750.0000 mg | ORAL_TABLET | Freq: Four times a day (QID) | ORAL | Status: DC
Start: 2015-03-16 — End: 2015-03-18
  Administered 2015-03-16 – 2015-03-18 (×8): 750 mg via ORAL
  Filled 2015-03-16 (×3): qty 1
  Filled 2015-03-16: qty 16
  Filled 2015-03-16: qty 1
  Filled 2015-03-16 (×3): qty 16
  Filled 2015-03-16: qty 1
  Filled 2015-03-16 (×2): qty 16
  Filled 2015-03-16 (×6): qty 1
  Filled 2015-03-16 (×2): qty 16
  Filled 2015-03-16: qty 1

## 2015-03-16 MED ORDER — QUETIAPINE FUMARATE 25 MG PO TABS
75.0000 mg | ORAL_TABLET | Freq: Three times a day (TID) | ORAL | Status: DC
  Administered 2015-03-16 – 2015-03-18 (×6): 75 mg via ORAL
  Filled 2015-03-16: qty 3
  Filled 2015-03-16 (×3): qty 12
  Filled 2015-03-16 (×4): qty 3
  Filled 2015-03-16: qty 12
  Filled 2015-03-16 (×2): qty 3
  Filled 2015-03-16: qty 12
  Filled 2015-03-16 (×2): qty 3
  Filled 2015-03-16: qty 12

## 2015-03-16 MED ORDER — ACAMPROSATE CALCIUM 333 MG PO TBEC
666.0000 mg | DELAYED_RELEASE_TABLET | Freq: Three times a day (TID) | ORAL | Status: DC
  Administered 2015-03-16 – 2015-03-18 (×6): 666 mg via ORAL
  Filled 2015-03-16: qty 2
  Filled 2015-03-16: qty 24
  Filled 2015-03-16 (×3): qty 2
  Filled 2015-03-16: qty 24
  Filled 2015-03-16 (×2): qty 2
  Filled 2015-03-16 (×2): qty 24
  Filled 2015-03-16: qty 2
  Filled 2015-03-16: qty 24
  Filled 2015-03-16 (×2): qty 2
  Filled 2015-03-16: qty 24
  Filled 2015-03-16: qty 2

## 2015-03-16 NOTE — [facility] Group Notes (Signed)
[facility] LCSW Group Therapy 03/16/2015  1:15 PM Type of Therapy: Group Therapy Participation Level: Active  Participation Quality: Attentive, Sharing and Supportive  Affect: Appropriate  Cognitive: Alert and Oriented  Insight: Developing/Improving and Engaged  Engagement in Therapy: Developing/Improving and Engaged  Modes of Intervention: Clarification, Confrontation, Discussion, Education, Exploration, Limit-setting, Orientation, Problem-solving, Rapport Building, Dance movement psychotherapist, Socialization and Support  Summary of Progress/Problems: The topic for group today was emotional regulation. This group focused on both positive and negative emotion identification and allowed group members to process ways to identify feelings, regulate negative emotions, and find healthy ways to manage internal/external emotions. Group members were asked to reflect on a time when their reaction to an emotion led to a negative outcome and explored how alternative responses using emotion regulation would have benefited them. Group members were also asked to discuss a time when emotion regulation was utilized when a negative emotion was experienced. Patient identified difficulty managing anger and shared that she typically lashes out verbally and physically, as well as isolates herself when experiencing anger. Patient discussed other ways in which she can cope including going running, and disengaging from people that negative. Patient shared that she has difficulty using healthier coping skills due to impulsivity and immediate mood swings. Patient related to and provided emotional support to another group member who was grieving and shared her experience of losing her sister to suicide 3 years ago. CSW and other group members provided patient with emotional support and encouragement.  Samuella Bruin, MSW, [employer] Clinical Social Worker [employer] [phone]

## 2015-03-16 NOTE — Progress Notes (Addendum)
D: Pt complains of pain to one of the teeth on the left side of her mouth. Pt states that she continues to have night terrors. Pt states that she she hopes the increase in Seroquel will improve her sleep so she does not have night terrors. Pt states that she is anxious but it is gradually improving. Pt states that  she continues to have problems eating but she states that since she had gastric bypass surgery she is unable to tolerate red meat or pasta. Pt states that her mood is improving.  A:Scheduled and PRN medication given as ordered. Continue every 15 minute checks. Continue encouragement and support. R: POC continued. Continue to monitor safety.  At 0245, pt woke up and wanted to take a shower. Pt was lightheaded and dizzy. Staff encouraged Pt not to take a shower. Pt was confused and disoriented. Pt had urinated in her bed. Staff monitored pt clean herself up and the linens on her bed were changed. Pt got lightheaded on her way back to bed and was escorted back to bed. Pt's blood pressure WNL. Pt went back to sleep. Will continue to monitor.

## 2015-03-16 NOTE — Progress Notes (Signed)
Prior to dinner, a patient on the 400 hall told Khilyn to "Get your white ass over there," meaning the 300 hall. Elynor responded, "Get your black ass over there." Situation quickly de-escalate and both patients were able to go to dinner without incident.

## 2015-03-16 NOTE — Progress Notes (Signed)
[facility] MD Progress Note  03/16/2015 4:38 PM Beth [name]  MRN:  [id number] Subjective:  Beth [name] is trying to understand what is going on with her. She is trying to figure out how the MS is affecting her depression and the other symptoms she has been experiencing. She is having nightmares, difficulty with memory, at times word finding. She feels the Seroquel during the day is helping and so is the Seroquel at night. She would like to see if they can help more. She also understands that her alcohol use aggravates what she is going trough. Principal Problem: Major depressive disorder, recurrent, severe without psychotic features Diagnosis:   Patient Active Problem List   Diagnosis Date Noted  . 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 Polysubstance dependence [F19.20]     Priority: High  . Alcohol use disorder, moderate, dependence [F10.20] 01/13/2015    Priority: High  . Substance induced mood disorder [F19.94]   . Substance abuse [F19.10] 03/11/2015  . 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 Free intraperitoneal air [K66.8] 08/18/2014  . Perforated ulcer [K27.5] 08/18/2014  . 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 Acid reflux [K21.9]   . History of blood transfusion [Z92.89]   . Multiple sclerosis [G35] 03/13/2012  . H/O gastric bypass [Z98.84] 03/13/2012   Total Time spent with patient: 30 minutes   Past Medical History:  Past Medical History  Diagnosis Date  . Acid reflux   . Asthma   . History of blood transfusion   . Multiple sclerosis 2005    diagnosed in [place], failed steroids, no other medication  . Anxiety   . Gastric ulcer   . Migraine   . Chronic nausea     self reported  . Chronic diarrhea     self reported  . Chronic pain     self reported chronic pain, back pain, fibromyalgia  . Insomnia   .  Polysubstance abuse   . ETOH abuse   . Delirium tremens   . Depression     Past Surgical History  Procedure Laterality Date  . Gastric bypass    . Diagnostic laparoscopy  2003    repair of perforation at GJ anastamosis   . Lumbar laminectomy/decompression microdiscectomy Right 04/14/2014    Procedure: LUMBAR LAMINECTOMY/DECOMPRESSION MICRODISCECTOMY LEVEL L4-5;  Surgeon: Maeola Harman, MD;  Location: [facility];  Service: Neurosurgery;  Laterality: Right;  LUMBAR LAMINECTOMY/DECOMPRESSION MICRODISCECTOMY LEVEL L4-5  . Laparotomy N/A 08/18/2014    Procedure: EXPLORATORY LAPAROTOMY, Patch graft of gastric perforation;  Surgeon: Abigail Miyamoto, MD;  Location: [facility] OR;  Service: General;  Laterality: N/A;   Family History:  Family History  Problem Relation Age of Onset  . Hypertension Paternal Grandfather   . Colon cancer Paternal Grandfather 38  . Asthma Paternal Grandmother   . Hypertension Paternal Grandmother   . Breast cancer Paternal Grandmother   . Cancer Paternal Grandmother     lung   . Diabetes Maternal Grandmother   . Diabetes Maternal Grandfather   . Hypertension Father   . Urinary tract infection Mother   . Stroke Mother 62    3 strokes    Social History:  History  Alcohol Use  . 195.6 oz/week  . 25 Cans of beer, 301 Shots of liquor per week    Comment: Says she can drink up to a 1/2 gallon/day  History  Drug Use No    Comment: cocaine-none >1 year.  meth-none >10 years.    History   Social History  . Marital Status: Single    Spouse Name: N/A  . Number of Children: N/A  . Years of Education: N/A   Social History Main Topics  . Smoking status: Current Some Day Smoker -- 0.10 packs/day    Types: Cigarettes  . Smokeless tobacco: Never Used  . Alcohol Use: 195.6 oz/week    25 Cans of beer, 301 Shots of liquor per week     Comment: Says she can drink up to a 1/2 gallon/day  . Drug Use: No     Comment: cocaine-none >1 year.  meth-none >10 years.  .  Sexual Activity: Yes    Birth Control/ Protection: None   Other Topics Concern  . None   Social History Narrative   Additional History:    Sleep: improving  Appetite:  Fair   Assessment:   Musculoskeletal: Strength & Muscle Tone: within normal limits Gait & Station: normal Patient leans: N/A   Psychiatric Specialty Exam: Physical Exam  Review of Systems  Constitutional: Positive for malaise/fatigue.  HENT: Negative.   Eyes: Negative.   Respiratory: Negative.   Cardiovascular: Negative.   Gastrointestinal: Negative.   Genitourinary: Negative.   Musculoskeletal: Negative.   Skin: Negative.   Neurological: Negative.   Endo/Heme/Allergies: Negative.   Psychiatric/Behavioral: Positive for depression, memory loss and substance abuse. The patient is nervous/anxious.     Blood pressure 110/67, pulse 117, temperature 98.2 F (36.8 C), temperature source Oral, resp. rate 20, height 5' 5.5" (1.664 m), weight 108.863 kg (240 lb), last menstrual period 02/08/2015.Body mass index is 39.32 kg/(m^2).  General Appearance: Fairly Groomed  Patent attorney::  Fair  Speech:  Clear and Coherent  Volume:  Normal  Mood:  Anxious and worried  Affect:  anxious worried  Thought Process:  Coherent and Goal Directed  Orientation:  Full (Time, Place, and Person)  Thought Content:  symptoms events worries concerns  Suicidal Thoughts:  No  Homicidal Thoughts:  No  Memory:  Immediate;   Fair Recent;   Fair Remote;   Fair  Judgement:  Fair  Insight:  Present  Psychomotor Activity:  Restlessness  Concentration:  Fair  Recall:  [employer] of Knowledge:Fair  Language: Fair  Akathisia:  No  Handed:  Right  AIMS (if indicated):     Assets:  Desire for Improvement Housing  ADL's:  Intact  Cognition: WNL  Sleep:  Number of Hours: 6     Current Medications: Current Facility-Administered Medications  Medication Dose Route Frequency Provider Last Rate Last Dose  . acamprosate (CAMPRAL)  tablet 666 mg  666 mg Oral TID WC Rachael Fee, MD      . acetaminophen (TYLENOL) tablet 650 mg  650 mg Oral Q6H PRN Adonis Brook, NP   650 mg at 03/15/15 2226  . alum & mag hydroxide-simeth (MAALOX/MYLANTA) 200-200-20 MG/5ML suspension 30 mL  30 mL Oral Q4H PRN Adonis Brook, NP      . benzocaine (ORAJEL) 10 % mucosal gel   Mouth/Throat QID PRN Rachael Fee, MD      . gabapentin (NEURONTIN) tablet 600 mg  600 mg Oral QID Adonis Brook, NP   600 mg at 03/16/15 1109  . hydrOXYzine (ATARAX/VISTARIL) tablet 25 mg  25 mg Oral Q6H PRN Kerry Hough, PA-C   25 mg at 03/15/15 1613  . ketorolac (TORADOL) tablet 10 mg  10 mg Oral Q6H PRN Adonis Brook, NP   10 mg at 03/16/15 1109  . lamoTRIgine (LAMICTAL) tablet 25 mg  25 mg Oral Daily Rachael Fee, MD   25 mg at 03/16/15 0726  . magnesium hydroxide (MILK OF MAGNESIA) suspension 30 mL  30 mL Oral Daily PRN Adonis Brook, NP      . methocarbamol (ROBAXIN) tablet 750 mg  750 mg Oral QID Rachael Fee, MD      . multivitamin with minerals tablet 1 tablet  1 tablet Oral Daily Adonis Brook, NP   1 tablet at 03/16/15 1610  . nicotine polacrilex (NICORETTE) gum 2 mg  2 mg Oral PRN Adonis Brook, NP   2 mg at 03/16/15 1109  . ondansetron (ZOFRAN-ODT) disintegrating tablet 4 mg  4 mg Oral Q6H PRN Rachael Fee, MD   4 mg at 03/15/15 1836  . QUEtiapine (SEROQUEL) tablet 200 mg  200 mg Oral QHS Rachael Fee, MD      . QUEtiapine (SEROQUEL) tablet 75 mg  75 mg Oral TID Rachael Fee, MD      . SUMAtriptan [facility]) tablet 50 mg  50 mg Oral Q2H PRN Adonis Brook, NP   50 mg at 03/16/15 0727  . thiamine (VITAMIN B-1) tablet 100 mg  100 mg Oral Daily Adonis Brook, NP   100 mg at 03/16/15 9604    Lab Results: No results found for this or any previous visit (from the past 48 hour(s)).  Physical Findings: AIMS: Facial and Oral Movements Muscles of Facial Expression: None, normal Lips and Perioral Area: None, normal Jaw: None, normal Tongue: None,  normal,Extremity Movements Upper (arms, wrists, hands, fingers): None, normal Lower (legs, knees, ankles, toes): None, normal, Trunk Movements Neck, shoulders, hips: None, normal, Overall Severity Severity of abnormal movements (highest score from questions above): None, normal Incapacitation due to abnormal movements: None, normal Patient's awareness of abnormal movements (rate only patient's report): No Awareness, Dental Status Current problems with teeth and/or dentures?: Yes ("holes in teeth") Does patient usually wear dentures?: No  CIWA:  CIWA-Ar Total: 3 COWS:     Treatment Plan Summary: Daily contact with patient to assess and evaluate symptoms and progress in treatment and Medication management Supportive approach/coping skills Alcohol dependence; continue to work a relapse prevention plan Major depression: will work with the Lamictal and the Seroquel as she has had poor response to antidepressants Mood instability; will continue to work with the Seroquel increasing the dose during the day Insomnia; will increase the dose of Seroquel at night Alcohol Cravings; will start Campral 333 mg two TID Discussed information on psychiatric symptoms /cognitive deficits associated  with the MS  Medical Decision Making:  Review of Psycho-Social Stressors (1), Review of Medication Regimen & Side Effects (2) and Review of New Medication or Change in Dosage (2)     Lorelei Heikkila A 03/16/2015, 4:38 PM

## 2015-03-16 NOTE — Progress Notes (Signed)
Recreation Therapy Notes  Date:  03/16/15 Time: 9:30am Location: [address] Dayroom  Group Topic: Stress Management  Goal Area(s) Addresses:  Patient will actively participate in stress management techniques presented during session.   Intervention: Stress management techniques  Activity:  Guided Imagery. LRT read a script that walked patients through the technique Guided Imagery. Technique was coupled with deep breathing.   Education:  Stress Management, Discharge Planning.   Clinical Observations/Feedback: Patient did not attend group.   Arvind Mexicano LRT/CTRS         Tawn Fitzner A 03/16/2015 3:42 PM 

## 2015-03-16 NOTE — Progress Notes (Signed)
D: Beth [name] denies SI/HI and does not appear to be responding to internal stimuli. She reports she still sometimes "sees spiders" and said she heard voices coming from the next room last night that apparently weren't there. She also reports hearing music that is not there. She reported having a dream in which she witnessed her mother's murder. "I am so tired of hearing these dreams," she wrote on her self-inventory. She has been calm and appropriate on the unit today. She rated depression 7/10, hopelessness 4/10, and anxiety 8/10. She has not experienced any periods of anxiety that she would describe as panic attacks and has sought/required fewer PRNs.   A: Scheduled and PRN medication provided as needed. Q15 safety checks maintained. Support/encouragement offered.   R: Pt remains safe on unit and proceeds with treatment. Will continue to monitor for needs and safety.

## 2015-03-16 NOTE — [facility] Group Notes (Signed)
   [facility] LCSW Aftercare Discharge Planning Group Note  03/16/2015  8:45 AM   Participation Quality: Alert, Appropriate and Oriented  Mood/Affect: Depressed and Flat  Depression Rating: 7  Anxiety Rating: 8  Thoughts of Suicide: Pt denies SI/HI  Will you contract for safety? Yes  Current AVH: Pt denies  Plan for Discharge/Comments: Pt attended discharge planning group and actively participated in group. CSW provided pt with today's workbook. Patient reports that she continues to experience AVH, night terrors, and difficulty sleeping. She plans to follow up with [facility] at discharge. Patient asked for information about DBT providers in the area.  Transportation Means: Pt reports access to transportation  Supports: No supports mentioned at this time  Samuella Bruin, MSW, [employer] Clinical Social Worker [employer] [phone]

## 2015-03-16 NOTE — Progress Notes (Signed)
Patient ID: Beth [name], female   DOB: [birth year], 34 y.o.   MRN: [id number] D: Client visible on the unit, coloring and interacting with peers. A: Writer reviewed medications, administered as prescribed. Staff will monitor q54min for safety. R: Client is safe on the unit, attended group.

## 2015-03-17 ENCOUNTER — Ambulatory Visit (HOSPITAL_COMMUNITY): Payer: Self-pay | Admitting: Psychiatry

## 2015-03-17 LAB — CBC
HCT: 36.7 % (ref 36.0–46.0)
Hemoglobin: 11.4 g/dL — ABNORMAL LOW (ref 12.0–15.0)
MCH: 24.6 pg — ABNORMAL LOW (ref 26.0–34.0)
MCHC: 31.1 g/dL (ref 30.0–36.0)
MCV: 79.1 fL (ref 78.0–100.0)
PLATELETS: 264 10*3/uL (ref 150–400)
RBC: 4.64 MIL/uL (ref 3.87–5.11)
RDW: 20.1 % — ABNORMAL HIGH (ref 11.5–15.5)
WBC: 7.1 10*3/uL (ref 4.0–10.5)

## 2015-03-17 MED ORDER — PRAZOSIN HCL 1 MG PO CAPS
1.0000 mg | ORAL_CAPSULE | Freq: Every day | ORAL | Status: DC
  Administered 2015-03-17: 1 mg via ORAL
  Filled 2015-03-17: qty 4
  Filled 2015-03-17: qty 1
  Filled 2015-03-17: qty 4
  Filled 2015-03-17: qty 1

## 2015-03-17 MED ORDER — LAMOTRIGINE 25 MG PO TABS
25.0000 mg | ORAL_TABLET | Freq: Two times a day (BID) | ORAL | Status: DC
  Administered 2015-03-17 – 2015-03-18 (×2): 25 mg via ORAL
  Filled 2015-03-17: qty 8
  Filled 2015-03-17: qty 1
  Filled 2015-03-17 (×2): qty 8
  Filled 2015-03-17: qty 1
  Filled 2015-03-17: qty 8
  Filled 2015-03-17 (×2): qty 1

## 2015-03-17 NOTE — Progress Notes (Signed)
Pt attended Karaoke group. 

## 2015-03-17 NOTE — Progress Notes (Signed)
[facility] MD Progress Note  03/17/2015 5:39 PM Beth [name]  MRN:  [id number] Subjective:  Did sleep better last night with the increase in Seroquel but states that she had another one of her nightmares and feels she was scared and urinated while asleep. States the nightmares set the stage for her mood on that day. States that this morning she is feeling down depressed and endorsed she has had "negative thoughts" as states she does not want to keep having these episodes and is losing hope that they are ever going to go away. Sates that she feels very comfortable with the Seroquel as it has help with her mood Principal Problem: Major depressive disorder, recurrent, severe without psychotic features Diagnosis:   Patient Active Problem List   Diagnosis Date Noted  . 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 Polysubstance dependence [F19.20]     Priority: High  . Alcohol use disorder, moderate, dependence [F10.20] 01/13/2015    Priority: High  . Substance induced mood disorder [F19.94]   . Substance abuse [F19.10] 03/11/2015  . 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 Free intraperitoneal air [K66.8] 08/18/2014  . Perforated ulcer [K27.5] 08/18/2014  . 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 Acid reflux [K21.9]   . History of blood transfusion [Z92.89]   . Multiple sclerosis [G35] 03/13/2012  . H/O gastric bypass [Z98.84] 03/13/2012   Total Time spent with patient: 30 minutes   Past Medical History:  Past Medical History  Diagnosis Date  . Acid reflux   . Asthma   . History of blood transfusion   . Multiple sclerosis 2005    diagnosed in [place], failed steroids, no other medication  . Anxiety   . Gastric ulcer   . Migraine   . Chronic nausea     self reported  . Chronic diarrhea     self reported  . Chronic pain    self reported chronic pain, back pain, fibromyalgia  . Insomnia   . Polysubstance abuse   . ETOH abuse   . Delirium tremens   . Depression     Past Surgical History  Procedure Laterality Date  . Gastric bypass    . Diagnostic laparoscopy  2003    repair of perforation at [facility] anastamosis   . Lumbar laminectomy/decompression microdiscectomy Right 04/14/2014    Procedure: LUMBAR LAMINECTOMY/DECOMPRESSION MICRODISCECTOMY LEVEL L4-5;  Surgeon: Maeola Harman, MD;  Location: [facility];  Service: Neurosurgery;  Laterality: Right;  LUMBAR LAMINECTOMY/DECOMPRESSION MICRODISCECTOMY LEVEL L4-5  . Laparotomy N/A 08/18/2014    Procedure: EXPLORATORY LAPAROTOMY, Patch graft of gastric perforation;  Surgeon: Abigail Miyamoto, MD;  Location: [facility] OR;  Service: General;  Laterality: N/A;   Family History:  Family History  Problem Relation Age of Onset  . Hypertension Paternal Grandfather   . Colon cancer Paternal Grandfather 15  . Asthma Paternal Grandmother   . Hypertension Paternal Grandmother   . Breast cancer Paternal Grandmother   . Cancer Paternal Grandmother     lung   . Diabetes Maternal Grandmother   . Diabetes Maternal Grandfather   . Hypertension Father   . Urinary tract infection Mother   . Stroke Mother 51    3 strokes    Social History:  History  Alcohol Use  . 195.6 oz/week  . 25 Cans of beer, 301 Shots of liquor per week  Comment: Says she can drink up to a 1/2 gallon/day     History  Drug Use No    Comment: cocaine-none >1 year.  meth-none >10 years.    History   Social History  . Marital Status: Single    Spouse Name: N/A  . Number of Children: N/A  . Years of Education: N/A   Social History Main Topics  . Smoking status: Current Some Day Smoker -- 0.10 packs/day    Types: Cigarettes  . Smokeless tobacco: Never Used  . Alcohol Use: 195.6 oz/week    25 Cans of beer, 301 Shots of liquor per week     Comment: Says she can drink up to a 1/2 gallon/day  . Drug  Use: No     Comment: cocaine-none >1 year.  meth-none >10 years.  . Sexual Activity: Yes    Birth Control/ Protection: None   Other Topics Concern  . None   Social History Narrative   Additional History:    Sleep: Fair  Appetite:  Fair   Assessment:   Musculoskeletal: Strength & Muscle Tone: within normal limits Gait & Station: normal Patient leans: N/A   Psychiatric Specialty Exam: Physical Exam  Review of Systems  Constitutional: Negative.   HENT: Negative.   Eyes: Negative.   Respiratory: Negative.   Cardiovascular: Negative.   Gastrointestinal: Negative.   Genitourinary: Negative.   Musculoskeletal: Negative.   Skin: Negative.   Neurological: Negative.   Endo/Heme/Allergies: Negative.   Psychiatric/Behavioral: Positive for depression and substance abuse. The patient is nervous/anxious.     Blood pressure 104/81, pulse 102, temperature 97.9 F (36.6 C), temperature source Oral, resp. rate 18, height 5' 5.5" (1.664 m), weight 108.863 kg (240 lb), last menstrual period 02/08/2015.Body mass index is 39.32 kg/(m^2).  General Appearance: Fairly Groomed  Patent attorney::  Fair  Speech:  Clear and Coherent  Volume:  Decreased  Mood:  Anxious and Depressed  Affect:  Depressed and Tearful  Thought Process:  Coherent and Goal Directed  Orientation:  Full (Time, Place, and Person)  Thought Content:  symptoms events worries concerns  Suicidal Thoughts:  No  Homicidal Thoughts:  No  Memory:  Immediate;   Fair Recent;   Fair Remote;   Fair  Judgement:  Fair  Insight:  Present  Psychomotor Activity:  Restlessness  Concentration:  Fair  Recall:  [employer] of Knowledge:Fair  Language: Fair  Akathisia:  No  Handed:  Right  AIMS (if indicated):     Assets:  Desire for Improvement Housing Social Support  ADL's:  Intact  Cognition: WNL  Sleep:  Number of Hours: 6     Current Medications: Current Facility-Administered Medications  Medication Dose Route  Frequency Provider Last Rate Last Dose  . acamprosate (CAMPRAL) tablet 666 mg  666 mg Oral TID WC Rachael Fee, MD   666 mg at 03/17/15 1701  . acetaminophen (TYLENOL) tablet 650 mg  650 mg Oral Q6H PRN Adonis Brook, NP   650 mg at 03/17/15 1702  . alum & mag hydroxide-simeth (MAALOX/MYLANTA) 200-200-20 MG/5ML suspension 30 mL  30 mL Oral Q4H PRN Adonis Brook, NP      . benzocaine (ORAJEL) 10 % mucosal gel   Mouth/Throat QID PRN Rachael Fee, MD      . gabapentin (NEURONTIN) tablet 600 mg  600 mg Oral QID Adonis Brook, NP   600 mg at 03/17/15 1701  . hydrOXYzine (ATARAX/VISTARIL) tablet 25 mg  25 mg Oral Q6H PRN Karleen Hampshire  E Simon, PA-C   25 mg at 03/17/15 1117  . lamoTRIgine (LAMICTAL) tablet 25 mg  25 mg Oral BID Rachael Fee, MD   25 mg at 03/17/15 1701  . magnesium hydroxide (MILK OF MAGNESIA) suspension 30 mL  30 mL Oral Daily PRN Adonis Brook, NP      . methocarbamol (ROBAXIN) tablet 750 mg  750 mg Oral QID Rachael Fee, MD   750 mg at 03/17/15 1701  . multivitamin with minerals tablet 1 tablet  1 tablet Oral Daily Adonis Brook, NP   1 tablet at 03/17/15 0804  . nicotine polacrilex (NICORETTE) gum 2 mg  2 mg Oral PRN Adonis Brook, NP   2 mg at 03/17/15 0806  . ondansetron (ZOFRAN-ODT) disintegrating tablet 4 mg  4 mg Oral Q6H PRN Rachael Fee, MD   4 mg at 03/17/15 1610  . prazosin (MINIPRESS) capsule 1 mg  1 mg Oral QHS Rachael Fee, MD      . QUEtiapine (SEROQUEL) tablet 200 mg  200 mg Oral QHS Rachael Fee, MD   200 mg at 03/16/15 2235  . QUEtiapine (SEROQUEL) tablet 75 mg  75 mg Oral TID Rachael Fee, MD   75 mg at 03/17/15 1701  . SUMAtriptan (IMITREX) tablet 50 mg  50 mg Oral Q2H PRN Adonis Brook, NP   50 mg at 03/17/15 0805  . thiamine (VITAMIN B-1) tablet 100 mg  100 mg Oral Daily Adonis Brook, NP   100 mg at 03/17/15 0803    Lab Results: No results found for this or any previous visit (from the past 48 hour(s)).  Physical Findings: AIMS: Facial and Oral  Movements Muscles of Facial Expression: None, normal Lips and Perioral Area: None, normal Jaw: None, normal Tongue: None, normal,Extremity Movements Upper (arms, wrists, hands, fingers): None, normal Lower (legs, knees, ankles, toes): None, normal, Trunk Movements Neck, shoulders, hips: None, normal, Overall Severity Severity of abnormal movements (highest score from questions above): None, normal Incapacitation due to abnormal movements: None, normal Patient's awareness of abnormal movements (rate only patient's report): No Awareness, Dental Status Current problems with teeth and/or dentures?: Yes Does patient usually wear dentures?: No  CIWA:  CIWA-Ar Total: 4 COWS:     Treatment Plan Summary: Daily contact with patient to assess and evaluate symptoms and progress in treatment and Medication management Supportive approach/coping skills Alcohol Dependence; continue to work a relapse prevention plan Major Depression; will increase the Lamictal to 25 mg BID Nightmares; will start Prazosin 1 mg HS  Insomnia; will continue the Seroquel and get an EKG to reassess QT interval CBT/mindfulness Medical Decision Making:  Review of Psycho-Social Stressors (1), Review or order medicine tests (1), Review of Medication Regimen & Side Effects (2) and Review of New Medication or Change in Dosage (2)     Harwood Nall A 03/17/2015, 5:39 PM

## 2015-03-17 NOTE — [facility] Group Notes (Signed)
[facility] LCSW Group Therapy 03/17/2015  1:15 PM   Type of Therapy: Group Therapy  Participation Level: Minimal. Patient came into group during the last 5 minutes, appeared tearful regarding another patient's discharge and reported that she did not want to share on topic of Balance in Life.    Samuella Bruin, MSW, [employer] Clinical Social Worker [facility] [phone]

## 2015-03-17 NOTE — Progress Notes (Signed)
D:  Per pt self inventory pt reports sleeping fair, appetite poor--pt had night terror last night that woke her up, energy level low, ability to pay attention poor, rates depression at an 8 out of 10, hopelessness at a 6 out of 10, anxiety at a 7 out of 10, endorses SI on and off, contracts for safety, endorsing AH--hearing music, pt has been anxious today, irritable and states that she has a "short fuse" and would like to work on her anger, goal today:  "Control my anger, lashing out, getting frustrated too quickly with others, and letting others know what I am thinking and when I am thinking it"     A:  Emotional support provided, Encouraged pt to continue with treatment plan and attend all group activities, q15 min checks maintained for safety.  R:  Pt is receptive, going to groups, does not want to go home today or tomorrow, would like for staff to check and see if her insurance would cover her for additional days.

## 2015-03-17 NOTE — Progress Notes (Signed)
D   Pt continues to be very somatic and medication seeking   Requesting multiple medications and as much as she can get    She said she is more depressed since some of her peers have let    She did attend karaoke group this evening but said there was no change in her mood  A   Verbal support given   Medications administered and effectiveness monitored   Educate on medications and other methods to relieve pain and anxiety   Q 15 min checks R   Pt safe at present

## 2015-03-17 NOTE — [facility] Group Notes (Signed)
Adult Psychoeducational Group Note  Date:  03/17/2015 Time:  0900 am  Group Topic/Focus:  Goals Group:   The focus of this group is to help patients establish daily goals to achieve during treatment and discuss how the patient can incorporate goal setting into their daily lives to aide in recovery. Orientation:   The focus of this group is to educate the patient on the purpose and policies of crisis stabilization and provide a format to answer questions about their admission.  The group details unit policies and expectations of patients while admitted.  Participation Level:  Active  Participation Quality:  Attentive and Monopolizing  Affect:  Depressed and Irritable  Cognitive:  Alert and Appropriate  Insight: Improving  Engagement in Group:  Engaged and Monopolizing  Modes of Intervention:  Discussion, Education, Orientation and Support  Additional Comments:  Pt was attentive and participated during group, monopolizing at times, goal for today is to talk to the doctor about her "night terrors" and about "staying here longer", pt states that she feels like she is not ready for discharge, pt also wants to work on her "irritability"  Alfonse Spruce 03/17/2015, 2:37 PM

## 2015-03-18 MED ORDER — GABAPENTIN 600 MG PO TABS: 600.0000 mg | ORAL_TABLET | Freq: Four times a day (QID) | ORAL | Status: DC

## 2015-03-18 MED ORDER — PRAZOSIN HCL 1 MG PO CAPS: 1.0000 mg | ORAL_CAPSULE | Freq: Every day | ORAL | Status: DC

## 2015-03-18 MED ORDER — LAMOTRIGINE 25 MG PO TABS: 25.0000 mg | ORAL_TABLET | Freq: Two times a day (BID) | ORAL | Status: DC

## 2015-03-18 MED ORDER — QUETIAPINE FUMARATE 200 MG PO TABS: 200.0000 mg | ORAL_TABLET | Freq: Every day | ORAL | Status: DC

## 2015-03-18 MED ORDER — HYDROXYZINE HCL 25 MG PO TABS: 25.0000 mg | ORAL_TABLET | Freq: Four times a day (QID) | ORAL | Status: DC | PRN

## 2015-03-18 MED ORDER — METHOCARBAMOL 500 MG PO TABS: ORAL_TABLET | ORAL | Status: DC

## 2015-03-18 MED ORDER — SUMATRIPTAN SUCCINATE 50 MG PO TABS: 50.0000 mg | ORAL_TABLET | ORAL | Status: DC | PRN

## 2015-03-18 MED ORDER — ACAMPROSATE CALCIUM 333 MG PO TBEC: 666.0000 mg | DELAYED_RELEASE_TABLET | Freq: Three times a day (TID) | ORAL | Status: DC

## 2015-03-18 MED ORDER — QUETIAPINE FUMARATE 25 MG PO TABS: 75.0000 mg | ORAL_TABLET | Freq: Three times a day (TID) | ORAL | Status: DC

## 2015-03-18 NOTE — [facility] Suicide Risk Assessment (Signed)
[facility] Discharge Suicide Risk Assessment   Demographic Factors:  Caucasian  Total Time spent with patient: 30 minutes  Musculoskeletal: Strength & Muscle Tone: within normal limits Gait & Station: normal Patient leans: N/A  Psychiatric Specialty Exam: Physical Exam  Review of Systems  Constitutional: Negative.   HENT: Negative.   Eyes: Negative.   Respiratory: Negative.   Cardiovascular: Negative.   Gastrointestinal: Negative.   Genitourinary: Negative.   Musculoskeletal: Negative.   Skin: Negative.   Neurological: Negative.   Endo/Heme/Allergies: Negative.   Psychiatric/Behavioral: Positive for substance abuse. The patient is nervous/anxious.     Blood pressure 118/69, pulse 92, temperature 98 F (36.7 C), temperature source Oral, resp. rate 18, height 5' 5.5" (1.664 m), weight 108.863 kg (240 lb), last menstrual period 02/08/2015.Body mass index is 39.32 kg/(m^2).  General Appearance: Fairly Groomed  Patent attorney::  Fair  Speech:  Clear and Coherent409  Volume:  Decreased  Mood:  Anxious and worried  Affect:  Appropriate  Thought Process:  Coherent and Goal Directed  Orientation:  Full (Time, Place, and Person)  Thought Content:  symptoms events worries concerns as she moves on  Suicidal Thoughts:  No  Homicidal Thoughts:  No  Memory:  Immediate;   Fair Recent;   Fair Remote;   Fair  Judgement:  Fair  Insight:  Present  Psychomotor Activity:  Normal  Concentration:  Fair  Recall:  [employer] of Knowledge:Fair  Language: Fair  Akathisia:  No  Handed:  Right  AIMS (if indicated):     Assets:  Desire for Improvement Housing Social Support  Sleep:  Number of Hours: 6.25  Cognition: WNL  ADL's:  Intact   Have you used any form of tobacco in the last 30 days? (Cigarettes, Smokeless Tobacco, Cigars, and/or Pipes): Yes  Has this patient used any form of tobacco in the last 30 days? (Cigarettes, Smokeless Tobacco, Cigars, and/or Pipes) Yes, A prescription for an  FDA-approved tobacco cessation medication was offered at discharge and the patient refused  Mental Status Per Nursing Assessment::   On Admission:     Current Mental Status by Physician: In full contact with reality. There are no active S/S of withdrawal. There are no active SI plans or intent. She admits she is anxious about going home. States that she does not know what kind of attitude her parents are going to have towards her. States she thinks they are going to want to talk about what happened, blame her  shame her. At the same time she understands that they are being supportive of her. Admits that she creates different scenarios in her mind and that the reacts to them without they  necessarily being based on the reality of the situation. She also understand that due to her past behavior they are not trusting her and that she will have to ear their trust again by being consistent truthful in her behavior   Loss Factors: Decline in physical health  Historical Factors: NA  Risk Reduction Factors:   Sense of responsibility to family, Living with another person, especially a relative and Positive social support  Continued Clinical Symptoms:  Depression:   Comorbid alcohol abuse/dependence Impulsivity Alcohol/Substance Abuse/Dependencies  Cognitive Features That Contribute To Risk:  Closed-mindedness, Polarized thinking and Thought constriction (tunnel vision)    Suicide Risk:  Mild:  Suicidal ideation of limited frequency, intensity, duration, and specificity.  There are no identifiable plans, no associated intent, mild dysphoria and related symptoms, good self-control (both objective and  subjective assessment), few other risk factors, and identifiable protective factors, including available and accessible social support.  Principal Problem: Major depressive disorder, recurrent, severe without psychotic features Discharge Diagnoses:  Patient Active Problem List   Diagnosis Date Noted   . 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 Polysubstance dependence [F19.20]     Priority: High  . Alcohol use disorder, moderate, dependence [F10.20] 01/13/2015    Priority: High  . Substance induced mood disorder [F19.94]   . Substance abuse [F19.10] 03/11/2015  . 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 Free intraperitoneal air [K66.8] 08/18/2014  . Perforated ulcer [K27.5] 08/18/2014  . 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 Acid reflux [K21.9]   . History of blood transfusion [Z92.89]   . Multiple sclerosis [G35] 03/13/2012  . H/O gastric bypass [Z98.84] 03/13/2012    Follow-up Information    Follow up with [facility] On 03/21/2015.   Why:  Orientation with Ann on Monday May 23rd at 9 am. Please call office if you need to reschedule appointment.   Contact information:   [address] [place], [place] [address] Phone: [phone] Fax: [phone]      Follow up with [facility] On 05/23/2015.   Why:  Appointment with Dr. Lolly Mustache for medication management services on Monday July 25th at 8 am. Please call office if you need to reschedule appointment.   Contact information:   [address]  [place], [place]  [phone]      Plan Of Care/Follow-up recommendations:  Activity:  as tolerated Diet:  regular Follow up [facility] Will work on ways she can address her parents when she gets home. Will also work with CBT and identify cognitive errors like catastrophic thinking, foretelling etc and help to develop strategies of how to address them. Is patient on multiple antipsychotic therapies at discharge:  No   Has Patient had three or more failed trials of antipsychotic monotherapy by history:  No  Recommended Plan for Multiple Antipsychotic Therapies: NA    Jaleisa Brose  A 03/18/2015, 1:28 PM

## 2015-03-18 NOTE — Progress Notes (Signed)
Discharge Note:  Patient discharged home with her mother.  Denied SI and HI.  Denied A/V hallucinations.  Suicide prevention information given and discussed with patient who stated she understood and had no questions.  Patient stated she received all her belongings, pocket book, tennis shoes, charger, daily home meds, prescriptions, medications, toiletries, clothing, misc items, etc.  Patient stated she appreciated all assistance received from [facility] staff.

## 2015-03-18 NOTE — Progress Notes (Signed)
  [facility] Adult Case Management Discharge Plan :  Will you be returning to the same living situation after discharge:  Yes,  Patient plans to return home with her parents At discharge, do you have transportation home?: Yes,  patient reports access to transportation Do you have the ability to pay for your medications: Yes,  patient will be provided with medication samples and prescriptions   Patient to Follow up at: Follow-up Information    Follow up with [facility] On 03/21/2015.   Why:  Orientation with Ann on Monday May 23rd at 9 am. Please call office if you need to reschedule appointment.   Contact information:   [address] [place], [place] [address] Phone: [phone] Fax: [phone]      Follow up with [facility] On 05/23/2015.   Why:  Appointment with Dr. Lolly Mustache for medication management services on Monday July 25th at 8 am. Please call office if you need to reschedule appointment.   Contact information:   [address]  [place], [place]  [phone]      Patient denies SI/HI: Yes,  denies active SI, reports feeling safe for discharge  Safety Planning and Suicide Prevention discussed: Yes,  with patient and parents  Have you used any form of tobacco in the last 30 days? (Cigarettes, Smokeless Tobacco, Cigars, and/or Pipes): Yes  Has patient been referred to the Quitline?: Patient refused referral  Beth [name], [place] 03/18/2015, 11:13 AM

## 2015-03-18 NOTE — Progress Notes (Signed)
Patient talked about her sister's suicidal thoughts, depression, multiple personalities, dreams.  Stated she will follow up with her MD's and therapy sessions.  Safety maintained with 15 minute checks.

## 2015-03-18 NOTE — Progress Notes (Signed)
Recreation Therapy Notes  Date: 03/18/15 Time: 9:30am Location: [address] Day[address]  Group Topic: Stress Management  Goal Area(s) Addresses:  Patient will verbalize importance of using healthy stress management.  Patient will identify positive emotions associated with healthy stress management.   Behavioral Response: Attentive, engaged  Intervention: Guided Imagery Script  Activity :  Patients will listen to LRT read the Guided Imagery Script and follow the prompts to become more relaxed.  Education:  Stress Management, Discharge Planning.   Education Outcome: Acknowledges edcuation/In group clarification offered  Clinical Observations/Feedback: Patient gave full participation.  Patient stated the LRTs voice helped her to calm down and be more relaxed.  Caroll Rancher, LRT/CTRS  Caroll Rancher A 03/18/2015 4:42 PM

## 2015-03-18 NOTE — Tx Team (Signed)
  Interdisciplinary Treatment Plan Update (Adult)  Date:  03/18/2015  Time Reviewed:  8:30 AM   Progress in Treatment: Attending groups: Yes. Participating in groups:  Yes. Taking medication as prescribed:  Yes. Tolerating medication:  Yes. Family/Significant othe contact made:  Yes, CSW has spoken with pt's mother.  Patient understands diagnosis:  Yes. and As evidenced by:  seeing treatment for ETOH detox, medication stabilization, and bipolar Sx. Discussing patient identified problems/goals with staff:  Yes. Medical problems stabilized or resolved:  Yes. Denies suicidal/homicidal ideation: Yes. Issues/concerns per patient self-inventory: continued AVH and racing thoughts.  Other:  Discharge Plan or Barriers:   5/20: Patient will return home with parents and will follow up with [facility].  Reason for Continuation of Hospitalization: Anxiety Depression Hallucinations Medication stabilization Withdrawal symptoms  Comments:  Beth [name] is a 34 y.o. female who presents to [facility] for alcohol, cocaine and opiate detox and SI w/no plan or intent to harm herself. Pt reports that she went on a 5 day binge and drank 1/2 gal of alcohol, her last drink was 03/10/15, she had 2 shots before coming to the [employer]. Pt also uses cocaine, recreationally, stating that she uses 1 gram of cocaine with friends. Pt also uses 1-2 5mg  pills (roxy's), she used 1/2 on 03/10/15. Pt c/o w/d sxs shakes, headache, body aches, sweats and cold chills and excessive thirst. Per Dot Lanes, she was unable to get in touch with her sponsor and when an ex-BF called her, she started to drink. She states she went into the ED with her sponsor and was encouraged to provide staff with substances she had been using. Beth [name] is remorseful at this new turn of events, "I did so well with treatment right after I left here." She is here for detox and will resume a program once her crisis had been managed and stabilized.    Estimated length of stay:  Discharge anticipated for today 5/20.  New goal(s):   Additional Comments:  Patient and CSW reviewed pt's identified goals and treatment plan. Patient verbalized understanding and agreed to treatment plan. CSW reviewed [facility] "Discharge Process and Patient Involvement" Form. Pt verbalized understanding of information provided and signed form.   Attendees: Patient:   03/18/2015 8:30 AM   Family:   03/18/2015 8:30 AM   Physician:  Dr. Geoffery Lyons, MD 03/18/2015 8:30 AM   Nursing:   Rodman Key, Quintella Reichert RN 03/18/2015 8:30 AM   Clinical  Serena Colonel, NP 03/18/2015 8:30 AM   Clinical Social Worker: Belenda Cruise Chaz Ronning LCSWA 03/18/2015 8:30 AM   Other:  Onnie Boer, Nurse Case Manager 03/18/2015 8:30 AM   Other:  Leisa Lenz; [facility]  03/18/2015 8:30 AM   Other:   03/18/2015 8:30 AM   Other:  03/18/2015 8:30 AM   Other:  03/18/2015 8:30 AM   Other:  03/18/2015 8:30 AM    03/18/2015 8:30 AM    03/18/2015 8:30 AM    03/18/2015 8:30 AM    03/18/2015 8:30 AM    Samuella Bruin, MSW, LCSWA Clinical Social Worker [facility] [phone]

## 2015-03-18 NOTE — Discharge Summary (Signed)
Physician Discharge Summary Note  Patient:  Beth [name] is an 34 y.o., female MRN:  [id number] DOB:  June 22, 1981 Patient phone:  [phone] (home)  Patient address:   [address] [place] [address],  Total Time spent with patient: Greater than 30 minutes  Date of Admission:  03/11/2015  Date of Discharge: 03-18-15  Reason for Admission: Drug/alcohol intoxication & suicidal ideations  Principal Problem: Major depressive disorder, recurrent, severe without psychotic features Discharge Diagnoses: Patient Active Problem List   Diagnosis Date Noted  . Major depressive disorder, recurrent, severe without psychotic features [F33.2]   . Polysubstance dependence [F19.20]   . Substance induced mood disorder [F19.94]   . Substance abuse [F19.10] 03/11/2015  . 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 Alcohol use disorder, moderate, dependence [F10.20] 01/13/2015  . Free intraperitoneal air [K66.8] 08/18/2014  . Perforated ulcer [K27.5] 08/18/2014  . 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 Acid reflux [K21.9]   . History of blood transfusion [Z92.89]   . Multiple sclerosis [G35] 03/13/2012  . H/O gastric bypass [Z98.84] 03/13/2012   Musculoskeletal: Strength & Muscle Tone: within normal limits Gait & Station: normal Patient leans: N/A  Psychiatric Specialty Exam: Physical Exam  Psychiatric: Her speech is normal and behavior is normal. Judgment and thought content normal. Her mood appears not anxious. Her affect is not angry, not blunt, not labile and not inappropriate. Cognition and memory are normal. She does not exhibit a depressed mood.    Review of Systems  Constitutional: Negative.   HENT: Negative.   Eyes: Negative.   Respiratory: Negative.   Cardiovascular: Negative.   Gastrointestinal: Negative.   Genitourinary: Negative.   Musculoskeletal: Negative.    Skin: Negative.   Neurological: Negative.   Endo/Heme/Allergies: Negative.   Psychiatric/Behavioral: Positive for depression (Stable) and hallucinations (Polysubstance dependence). Negative for suicidal ideas and memory loss. The patient has insomnia (Stable). The patient is not nervous/anxious.     Blood pressure 95/54, pulse 115, temperature 98 F (36.7 C), temperature source Oral, resp. rate 18, height 5' 5.5" (1.664 m), weight 108.863 kg (240 lb), last menstrual period 02/08/2015.Body mass index is 39.32 kg/(m^2).  Greater than 30 minutes   Have you used any form of tobacco in the last 30 days? (Cigarettes, Smokeless Tobacco, Cigars, and/or Pipes): Yes   Has this patient used any form of tobacco in the last 30 days? (Cigarettes, Smokeless Tobacco, Cigars, and/or Pipes) Yes, A prescription for an FDA-approved tobacco cessation medication was offered at discharge and the patient refused  Past Medical History:  Past Medical History  Diagnosis Date  . Acid reflux   . Asthma   . History of blood transfusion   . Multiple sclerosis 2005    diagnosed in [place], failed steroids, no other medication  . Anxiety   . Gastric ulcer   . Migraine   . Chronic nausea     self reported  . Chronic diarrhea     self reported  . Chronic pain     self reported chronic pain, back pain, fibromyalgia  . Insomnia   . Polysubstance abuse   . ETOH abuse   . Delirium tremens   . Depression     Past Surgical History  Procedure Laterality Date  . Gastric bypass    . Diagnostic laparoscopy  2003    repair of perforation at [facility] anastamosis   . Lumbar laminectomy/decompression microdiscectomy Right 04/14/2014  Procedure: LUMBAR LAMINECTOMY/DECOMPRESSION MICRODISCECTOMY LEVEL L4-5;  Surgeon: Maeola Harman, MD;  Location: [facility] NEURO ORS;  Service: Neurosurgery;  Laterality: Right;  LUMBAR LAMINECTOMY/DECOMPRESSION MICRODISCECTOMY LEVEL L4-5  . Laparotomy N/A 08/18/2014    Procedure: EXPLORATORY LAPAROTOMY,  Patch graft of gastric perforation;  Surgeon: Abigail Miyamoto, MD;  Location: [facility] OR;  Service: General;  Laterality: N/A;   Family History:  Family History  Problem Relation Age of Onset  . Hypertension Paternal Grandfather   . Colon cancer Paternal Grandfather 13  . Asthma Paternal Grandmother   . Hypertension Paternal Grandmother   . Breast cancer Paternal Grandmother   . Cancer Paternal Grandmother     lung   . Diabetes Maternal Grandmother   . Diabetes Maternal Grandfather   . Hypertension Father   . Urinary tract infection Mother   . Stroke Mother 27    3 strokes    Social History:  History  Alcohol Use  . 195.6 oz/week  . 25 Cans of beer, 301 Shots of liquor per week    Comment: Says she can drink up to a 1/2 gallon/day     History  Drug Use No    Comment: cocaine-none >1 year.  meth-none >10 years.    History   Social History  . Marital Status: Single    Spouse Name: N/A  . Number of Children: N/A  . Years of Education: N/A   Social History Main Topics  . Smoking status: Current Some Day Smoker -- 0.10 packs/day    Types: Cigarettes  . Smokeless tobacco: Never Used  . Alcohol Use: 195.6 oz/week    25 Cans of beer, 301 Shots of liquor per week     Comment: Says she can drink up to a 1/2 gallon/day  . Drug Use: No     Comment: cocaine-none >1 year.  meth-none >10 years.  . Sexual Activity: Yes    Birth Control/ Protection: None   Other Topics Concern  . None   Social History Narrative   Risk to Self: Is patient at risk for suicide?: Yes Risk to Others: No Prior Inpatient Therapy: No Prior Outpatient Therapy: No  Level of Care:  OP  Hospital Course: Beth [name] is a 34 y.o. female who presents to [facility] for alcohol, cocaine and opiate detox and SI w/no plan or intent to harm herself. Pt reports that she went on a 5 day binge and drank 1/2 gal of alcohol, her last drink was 03/10/15, she had 2 shots before coming to the [employer]. Pt also uses  cocaine, recreationally, stating that she uses 1 gram of cocaine with friends. Pt also uses 1-2 5mg  pills (roxy's), she used 1/2 on 03/10/15. Pt c/o w/d sxs shakes, headache, body aches, sweats and cold chills and excessive thirst.   Beth [name] was admitted to the adult unit for Suicidal ideations related to worsening symptoms of Major depressive disorder without psychotic features. She was also using drugs & alcohol. Her UDS test results indicated positive Benzodiazepines, Cocaine & a BAL of 288 per toxicology test results. However, Beth [name] does not need detox treatments as she was presenting with no substance withdrawal symptoms upon arrival to the unit . During her admission assessment, she was evaluated and her symptoms identified. Medication management was discussed and initiated targeting her presenting symptoms. She was oriented to the unit and encouraged to participate in the unit programming. Her other presenting medical symptoms were identified and treated appropriately (migraine headaches & muscle pains). She tolerated her treatment  regimen without any significant adverse effects and or reactions.  During [facility] hospital stay, she was evaluated daily by a clinical provider to assure her response to her treatment regimen. As the day goes by, improvement was noted by the patient's report of decreasing symptoms, improved sleep, medication tolerance, presentation of appropriate behavior & participation in the unit programming.  She was required on daily basis to complete a self inventory asssessment noting mood, mental status, pain, any new symptoms, anxiety and or concerns. Her symptoms responded well to her treatment regimen. Also, being in a therapeutic and supportive environment assisted in her mood stability. Beth [name] was motivated for recovery. She worked closely with the treatment team and case manager to develop a discharge plan with appropriate goals to maintain mood stability after discharge.  Coping skills, problem solving as well as relaxation therapies were also part of her unit programming.  On this day of her hospital discharge, Beth [name] is in much improved condition than upon admission. Her symptoms were reported as significantly decreased or resolved completely. Upon discharge, she adamantly denies any SI/HI & voiced no AVH. She was motivated to continue taking medication with a goal of continued improvement in mental health. Beth [name] was discharged home with a plan to follow up as noted below. She was medicated & discharged on; Lamictal 25 mg for mood stabilization, Hydroxyzine 25 mg for anxiety, Seroquel 200 mg for mood control & 75 mg for agitation, Gabapentin 600 mg for agitation, Acamprosate 666 mg for alcoholism & prazosin 1 mg for nightmares. Beth [name] received from the [facility] pharmacy, a 4 days worth, supply samples of her [facility] discharge medications. She left [facility] with all personal belongings in no apparent distress. Transportation per patient arrangement.  Consults:  psychiatry  Significant Diagnostic Studies:  labs: CBC with diff, CMP, UDS, toxicology tests, U/A, results reviewed, stable   Consults:  psychiatry  Significant Diagnostic Studies:  labs: CBC with diff, CMP, UDS, toxicology tests, U/A, results reviewed, stable.  Discharge Vitals:   Blood pressure 95/54, pulse 115, temperature 98 F (36.7 C), temperature source Oral, resp. rate 18, height 5' 5.5" (1.664 m), weight 108.863 kg (240 lb), last menstrual period 02/08/2015. Body mass index is 39.32 kg/(m^2). Lab Results:   Results for orders placed or performed during the hospital encounter of 03/11/15 (from the past 72 hour(s))  CBC     Status: Abnormal   Collection Time: 03/17/15  7:15 PM  Result Value Ref Range   WBC 7.1 4.0 - 10.5 K/uL   RBC 4.64 3.87 - 5.11 MIL/uL   Hemoglobin 11.4 (L) 12.0 - 15.0 g/dL   HCT 16.1 09.6 - 04.5 %   MCV 79.1 78.0 - 100.0 fL   MCH 24.6 (L) 26.0 - 34.0 pg   MCHC 31.1 30.0 - 36.0 g/dL    RDW 40.9 (H) 81.1 - 15.5 %   Platelets 264 150 - 400 K/uL    Comment: Performed at [facility]   Physical Findings: AIMS: Facial and Oral Movements Muscles of Facial Expression: None, normal Lips and Perioral Area: None, normal Jaw: None, normal Tongue: None, normal,Extremity Movements Upper (arms, wrists, hands, fingers): None, normal Lower (legs, knees, ankles, toes): None, normal, Trunk Movements Neck, shoulders, hips: None, normal, Overall Severity Severity of abnormal movements (highest score from questions above): None, normal Incapacitation due to abnormal movements: None, normal Patient's awareness of abnormal movements (rate only patient's report): No Awareness, Dental Status Current problems with teeth and/or dentures?: Yes Does patient usually wear dentures?:  No  CIWA:  CIWA-Ar Total: 4 COWS:      See Psychiatric Specialty Exam and Suicide Risk Assessment completed by Attending Physician prior to discharge.  Discharge destination:  Home  Is patient on multiple antipsychotic therapies at discharge:  No    Has Patient had three or more failed trials of antipsychotic monotherapy by history:  No  Recommended Plan for Multiple Antipsychotic Therapies: NA    Medication List    STOP taking these medications        acetaminophen 650 MG CR tablet  Commonly known as:  TYLENOL     amoxicillin 500 MG capsule  Commonly known as:  AMOXIL     busPIRone 5 MG tablet  Commonly known as:  BUSPAR     diclofenac sodium 1 % Gel  Commonly known as:  VOLTAREN     gabapentin 300 MG capsule  Commonly known as:  NEURONTIN  Replaced by:  gabapentin 600 MG tablet     lidocaine 5 %  Commonly known as:  LIDODERM     neomycin-bacitracin-polymyxin Oint  Commonly known as:  NEOSPORIN     nortriptyline 75 MG capsule  Commonly known as:  PAMELOR     promethazine 25 MG tablet  Commonly known as:  PHENERGAN     traZODone 150 MG tablet  Commonly known as:   DESYREL      TAKE these medications      Indication   acamprosate 333 MG tablet  Commonly known as:  CAMPRAL  Take 2 tablets (666 mg total) by mouth 3 (three) times daily with meals. For alcoholism,   Indication:  Excessive Use of Alcohol     gabapentin 600 MG tablet  Commonly known as:  NEURONTIN  Take 1 tablet (600 mg total) by mouth 4 (four) times daily. agitation   Indication:  Agitation, Neuropathic Pain     hydrOXYzine 25 MG tablet  Commonly known as:  ATARAX/VISTARIL  Take 1 tablet (25 mg total) by mouth every 6 (six) hours as needed for anxiety.   Indication:  Anxiety     lamoTRIgine 25 MG tablet  Commonly known as:  LAMICTAL  Take 1 tablet (25 mg total) by mouth 2 (two) times daily. For mood stabilization   Indication:  Mood stabilization     methocarbamol 500 MG tablet  Commonly known as:  ROBAXIN  Take 750 mg (1.5 tablets) four times daily: For muscle pain   Indication:  Musculoskeletal Pain     prazosin 1 MG capsule  Commonly known as:  MINIPRESS  Take 1 capsule (1 mg total) by mouth at bedtime. For nightmares   Indication:  Nightmarea     QUEtiapine 200 MG tablet  Commonly known as:  SEROQUEL  Take 1 tablet (200 mg total) by mouth at bedtime. For mood control   Indication:  Mood control     QUEtiapine 25 MG tablet  Commonly known as:  SEROQUEL  Take 3 tablets (75 mg total) by mouth 3 (three) times daily. For agitation   Indication:  Agitation     SUMAtriptan 50 MG tablet  Commonly known as:  IMITREX  Take 1 tablet (50 mg total) by mouth every 2 (two) hours as needed for migraine or headache.   Indication:  Headache, Migraine Headache       Follow-up Information    Follow up with [facility] On 03/21/2015.   Why:  Orientation with Ann on Monday May 23rd at 9 am. Please call office if you  need to reschedule appointment.   Contact information:   [address] [place], [place] [address] Phone: [phone] Fax: [phone]      Follow  up with [facility] On 05/23/2015.   Why:  Appointment with Dr. Lolly Mustache for medication management services on Monday July 25th at 8 am. Please call office if you need to reschedule appointment.   Contact information:   [address]  [place], [place]  [phone]     Follow-up recommendations: Activity:  As tolerated Diet: As recommended by your primary care doctor. Keep all scheduled follow-up appointments as recommended.  Comments: Take all your medications as prescribed by your mental healthcare provider. Report any adverse effects and or reactions from your medicines to your outpatient provider promptly. Patient is instructed and cautioned to not engage in alcohol and or illegal drug use while on prescription medicines. In the event of worsening symptoms, patient is instructed to call the crisis hotline, 911 and or go to the nearest ED for appropriate evaluation and treatment of symptoms. Follow-up with your primary care provider for your other medical issues, concerns and or health care needs.  Total Discharge Time: Greater than 30 minutes   Signed: Sanjuana Kava, PMHNP, FNP-BC 03/18/2015, 10:34 AM  I personally assessed the patient and formulated the plan Madie Reno A. Dub Mikes, M.D.

## 2015-03-18 NOTE — BHH Group Notes (Signed)
   [facility] LCSW Aftercare Discharge Planning Group Note  03/18/2015  8:45 AM   Participation Quality: Alert, Appropriate and Oriented  Mood/Affect: Appropriate  Depression Rating: 6  Anxiety Rating: 5  Thoughts of Suicide: Pt endorses passive SI but reports that it is at baseline level; reports experiencing passive SI chronically  Will you contract for safety? Yes  Current AVH: Pt denies  Plan for Discharge/Comments: Pt attended discharge planning group and actively participated in group. CSW provided pt with today's workbook. Patient shared that she is feeling anxious regarding returning home today but feels ready. She will follow up with [facility].   Transportation Means: Pt reports access to transportation  Supports: Patient identifies her parents as supportive.  Samuella Bruin, MSW, [employer] Clinical Social Worker [facility] [phone]

## 2015-03-18 NOTE — Progress Notes (Signed)
D:  Patient's self inventory sheet, patient sleeps good, sleep medication is helpful.  Fair appetite, low energy level, foggy brained.  Rated depression 6, hopeless and anxiety #5.  Withdrawals agitation, nausea, irritable, anger outbursts.  SI due to unknown, no plan to do it, just racing thoughts and anxiety just not as bad any more, they are slowly going away.  Physical problems pain, dizziness, headaches, stumbling over own feet.  Pain #8 worst, hips back, legs and ankles.  "They took me off torodol and I was only using it when I needed it."   Goal is work on anger/mood swings.  What is plan for recovery and can parents help.  Will discuss with MD and SW.  Still hears voices/conversations at night.  Saw spiders yesterday.  Does have discharge plans.  Fear and anxiety of what is to come, needs assistance, need to file disability and live on my own, get food assistance.  Saw hand in corner yesterday.  Unsure if I am ready to discharge.  Do not know where bruises on legs happened.   A:  Medications administered per MD orders.  Emotional support and encouragement given patient. R:  Denied SI and HI while talking to nurse this morning, contracts for safety.  Stated that yesterday she saw spiders and heard conversations, but no A/V hallucinations this morning. Patient stated this morning that she does not want to leave.

## 2015-03-23 ENCOUNTER — Encounter (HOSPITAL_COMMUNITY): Payer: Self-pay | Admitting: Psychology

## 2015-03-23 ENCOUNTER — Other Ambulatory Visit (HOSPITAL_COMMUNITY): Payer: BLUE CROSS/BLUE SHIELD | Attending: Psychiatry | Admitting: Psychology

## 2015-03-23 DIAGNOSIS — F332 Major depressive disorder, recurrent severe without psychotic features: Secondary | ICD-10-CM | POA: Insufficient documentation

## 2015-03-23 DIAGNOSIS — F1020 Alcohol dependence, uncomplicated: Secondary | ICD-10-CM

## 2015-03-24 ENCOUNTER — Encounter (HOSPITAL_COMMUNITY): Payer: Self-pay | Admitting: Psychology

## 2015-03-24 NOTE — Progress Notes (Signed)
    Daily Group Progress Note  Program: CD-IOP   Group Time: 1-2:30 pm  Participation Level: Active  Behavioral Response: Appropriate  Type of Therapy: Process Group  Topic: Process:  The first part of group was spent in process. Members shared about current issues and concerns regarding their recovery. Members included any meetings or sessions with sponsor. A new member was present and she introduced herself. Drug tests were collected from new members and the results of those tests collected on Monday were returned.  Group Time: 2:45- 4pm  Participation Level: Minimal  Behavioral Response: Appropriate  Type of Therapy: Psycho-education Group  Topic: Psycho-Ed; The Clichs of AA: the second half of group was spent in a psycho-ed. A presentation and ensuing discussion on the meaning of the most well-known AA clichs was provided. Members were asked to identify the meaning of these 5 well-known sayings, including things like "Easy Does It" and "First Things First". While some members understood the meanings behind these sayings, others were not so clear. A lengthy discussion concerning the accuracy and importance of them occurred and there was excellent feedback among group members. The importance of taking things slowly, the priority on sobriety and letting go of control were some of the meanings behind these clichs.   Summary: The patient was new to the group and introduced herself in the first part of group. She described a chaotic life with heavy alcohol and drug use that began after her sister's suicide in 2013. She had recently completed 28 days of treatment at [facility] and was active in the NA community, when she relapsed. She ended up spending 8 days in detox upstairs. The patient explained that because of her emotional state her parents don't trust her with her car yet and she has having to get rides from friends and family. She shared openly about her addiction and seemed  comfortable in the group today. The patient received a warm welcome and responded well to this first intervention. Her sobriety date is 5/12.    Family Program: Family present? No   Name of family member(s):   UDS collected: No Results:   AA/NA attended?: YesTuesday  Sponsor?: Yes   Agusta Hackenberg, LCAS

## 2015-03-25 ENCOUNTER — Other Ambulatory Visit (HOSPITAL_COMMUNITY): Payer: BLUE CROSS/BLUE SHIELD | Admitting: Psychology

## 2015-03-25 DIAGNOSIS — F1020 Alcohol dependence, uncomplicated: Secondary | ICD-10-CM

## 2015-03-25 DIAGNOSIS — F332 Major depressive disorder, recurrent severe without psychotic features: Secondary | ICD-10-CM | POA: Diagnosis present

## 2015-03-29 ENCOUNTER — Encounter (HOSPITAL_COMMUNITY): Payer: Self-pay | Admitting: Psychology

## 2015-03-29 NOTE — Progress Notes (Signed)
    Daily Group Progress Note  Program: CD-IOP   Group Time: 1-2:30 pm  Participation Level: Active  Behavioral Response: Appropriate  Type of Therapy: Process Group  Topic: Process; the first part of group was spent in process. Members shared about current issues and challenges in early recovery. Members were asked to identify what they had done to support their recovery and sobriety since the last group session.   Group Time: 2:45- 4pm  Participation Level: Active  Behavioral Response: Sharing  Type of Therapy: Psycho-education Group  Topic: Holiday Plans: the second half of group was spent in a psycho-ed. The focus of the session was on identifying potential relapse triggers to be addressed during the upcoming holiday weekend. Memorial Day starts tomorrow and the group does not meet on Monday. Members shared about their plans for the holiday and what strategies they had in place to insure they return on Wednesday clean and sober. The session included good feedback and self-disclosure among group members.   Summary: The patient reported she had spoken with her step-father and he had agreed she could drive her car to meetings and here to group. She had attended 1 NA meeting yesterday evening. The patient reported she had helped her father with his landscaping job yesterday and she was very sore today. She had spent the day laying pine straw and other things and admitted she was not in very good shape. She had also attended the Wednesday evening Church session with both parents and it had been a very good service and felt really good to be with her family. Over the weekend, she has plans with her parents to help them around the house, but intends to go to numerous meetings and work with her new sponsor. She made some good comments and responded well to this intervention. Her sobriety date is 5/12.     Family Program: Family present? No   Name of family member(s):   UDS collected:  Yes Results:pending  AA/NA attended?: YesThursday  Sponsor?: Yes   Kijana Cromie, LCAS

## 2015-03-30 ENCOUNTER — Other Ambulatory Visit (HOSPITAL_COMMUNITY): Payer: BLUE CROSS/BLUE SHIELD | Attending: Psychiatry | Admitting: Psychology

## 2015-03-30 DIAGNOSIS — F332 Major depressive disorder, recurrent severe without psychotic features: Secondary | ICD-10-CM | POA: Insufficient documentation

## 2015-03-30 DIAGNOSIS — F17210 Nicotine dependence, cigarettes, uncomplicated: Secondary | ICD-10-CM | POA: Diagnosis not present

## 2015-03-30 DIAGNOSIS — F1020 Alcohol dependence, uncomplicated: Secondary | ICD-10-CM | POA: Insufficient documentation

## 2015-03-31 ENCOUNTER — Telehealth (HOSPITAL_COMMUNITY): Payer: Self-pay | Admitting: Psychology
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
## 2015-04-01 ENCOUNTER — Other Ambulatory Visit (HOSPITAL_COMMUNITY): Payer: BLUE CROSS/BLUE SHIELD

## 2015-04-01 ENCOUNTER — Encounter (HOSPITAL_COMMUNITY): Payer: Self-pay | Admitting: Psychology
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
## 2015-04-04 ENCOUNTER — Other Ambulatory Visit (HOSPITAL_COMMUNITY): Payer: BLUE CROSS/BLUE SHIELD

## 2015-04-04 NOTE — Progress Notes (Signed)
Beth [name] is a 34 y.o. female patient. CD-IOP: Excused Absence. The patient did not appear for group today. She had told me yesterday, when we met for our first individual session, that she had an appointment for a consultation for dental surgery and might not be back in time for group. The patient had gone to her dentist at the conclusion of our appointment and they had discovered the problem needed immediate attention. We will wait to speak with her on Monday. Her absence today is excused.          Aarian Griffie, LCAS

## 2015-04-06 ENCOUNTER — Other Ambulatory Visit (HOSPITAL_COMMUNITY): Payer: BLUE CROSS/BLUE SHIELD

## 2015-04-07 ENCOUNTER — Telehealth (HOSPITAL_COMMUNITY): Payer: Self-pay | Admitting: Psychology

## 2015-04-08 ENCOUNTER — Other Ambulatory Visit (HOSPITAL_COMMUNITY): Payer: BLUE CROSS/BLUE SHIELD

## 2015-04-11 ENCOUNTER — Other Ambulatory Visit (HOSPITAL_COMMUNITY): Payer: BLUE CROSS/BLUE SHIELD

## 2015-04-13 ENCOUNTER — Other Ambulatory Visit (HOSPITAL_COMMUNITY): Payer: BLUE CROSS/BLUE SHIELD

## 2015-04-15 ENCOUNTER — Other Ambulatory Visit (HOSPITAL_COMMUNITY): Payer: BLUE CROSS/BLUE SHIELD

## 2015-04-18 ENCOUNTER — Other Ambulatory Visit (HOSPITAL_COMMUNITY): Payer: BLUE CROSS/BLUE SHIELD

## 2015-04-20 ENCOUNTER — Encounter (HOSPITAL_COMMUNITY): Payer: Self-pay

## 2015-04-20 ENCOUNTER — Encounter: Payer: Self-pay | Admitting: Psychiatry

## 2015-04-20 ENCOUNTER — Emergency Department (HOSPITAL_COMMUNITY)
Admission: EM | Admit: 2015-04-20 | Discharge: 2015-04-21 | Disposition: A | Discharge status: Home / Self Care | Payer: BLUE CROSS/BLUE SHIELD | Attending: Emergency Medicine | Admitting: Emergency Medicine

## 2015-04-20 ENCOUNTER — Other Ambulatory Visit (HOSPITAL_COMMUNITY): Payer: BLUE CROSS/BLUE SHIELD

## 2015-04-20 DIAGNOSIS — F19288 Other psychoactive substance dependence with other psychoactive substance-induced disorder: Secondary | ICD-10-CM | POA: Diagnosis not present

## 2015-04-20 DIAGNOSIS — Z8719 Personal history of other diseases of the digestive system: Secondary | ICD-10-CM | POA: Diagnosis not present

## 2015-04-20 DIAGNOSIS — Z8669 Personal history of other diseases of the nervous system and sense organs: Secondary | ICD-10-CM | POA: Diagnosis not present

## 2015-04-20 DIAGNOSIS — F329 Major depressive disorder, single episode, unspecified: Secondary | ICD-10-CM | POA: Diagnosis not present

## 2015-04-20 DIAGNOSIS — F419 Anxiety disorder, unspecified: Secondary | ICD-10-CM | POA: Insufficient documentation

## 2015-04-20 DIAGNOSIS — F1020 Alcohol dependence, uncomplicated: Secondary | ICD-10-CM | POA: Diagnosis present

## 2015-04-20 DIAGNOSIS — R45851 Suicidal ideations: Secondary | ICD-10-CM

## 2015-04-20 DIAGNOSIS — Z79899 Other long term (current) drug therapy: Secondary | ICD-10-CM | POA: Diagnosis not present

## 2015-04-20 DIAGNOSIS — F1099 Alcohol use, unspecified with unspecified alcohol-induced disorder: Secondary | ICD-10-CM | POA: Diagnosis not present

## 2015-04-20 DIAGNOSIS — G8929 Other chronic pain: Secondary | ICD-10-CM | POA: Insufficient documentation

## 2015-04-20 DIAGNOSIS — G43909 Migraine, unspecified, not intractable, without status migrainosus: Secondary | ICD-10-CM | POA: Insufficient documentation

## 2015-04-20 DIAGNOSIS — G4700 Insomnia, unspecified: Secondary | ICD-10-CM | POA: Diagnosis not present

## 2015-04-20 DIAGNOSIS — Z720 Tobacco use: Secondary | ICD-10-CM | POA: Diagnosis not present

## 2015-04-20 DIAGNOSIS — J45909 Unspecified asthma, uncomplicated: Secondary | ICD-10-CM | POA: Diagnosis not present

## 2015-04-20 DIAGNOSIS — F332 Major depressive disorder, recurrent severe without psychotic features: Secondary | ICD-10-CM | POA: Diagnosis not present

## 2015-04-20 LAB — COMPREHENSIVE METABOLIC PANEL
ALBUMIN: 4.2 g/dL (ref 3.5–5.0)
ALK PHOS: 82 U/L (ref 38–126)
ALT: 17 U/L (ref 14–54)
ANION GAP: 12 (ref 5–15)
AST: 22 U/L (ref 15–41)
BUN: 8 mg/dL (ref 6–20)
CHLORIDE: 111 mmol/L (ref 101–111)
CO2: 23 mmol/L (ref 22–32)
Calcium: 9 mg/dL (ref 8.9–10.3)
Creatinine, Ser: 0.83 mg/dL (ref 0.44–1.00)
GFR calc Af Amer: >60 mL/min (ref >60)
Glucose, Bld: 96 mg/dL (ref 65–99)
Potassium: 3.9 mmol/L (ref 3.5–5.1)
Sodium: 146 mmol/L — ABNORMAL HIGH (ref 135–145)
Total Bilirubin: 0.3 mg/dL (ref 0.3–1.2)
Total Protein: 8.3 g/dL — ABNORMAL HIGH (ref 6.5–8.1)

## 2015-04-20 LAB — CBC
HCT: 44.2 % (ref 36.0–46.0)
Hemoglobin: 14.8 g/dL (ref 12.0–15.0)
MCH: 26.8 pg (ref 26.0–34.0)
MCHC: 33.5 g/dL (ref 30.0–36.0)
MCV: 80.1 fL (ref 78.0–100.0)
PLATELETS: 394 10*3/uL (ref 150–400)
RBC: 5.52 MIL/uL — ABNORMAL HIGH (ref 3.87–5.11)
RDW: 16.7 % — AB (ref 11.5–15.5)
WBC: 9.2 10*3/uL (ref 4.0–10.5)

## 2015-04-20 LAB — RAPID URINE DRUG SCREEN, HOSP PERFORMED
Amphetamines: NOT DETECTED
BENZODIAZEPINES: POSITIVE — AB
Barbiturates: NOT DETECTED
Cocaine: POSITIVE — AB
OPIATES: NOT DETECTED
Tetrahydrocannabinol: NOT DETECTED

## 2015-04-20 LAB — ACETAMINOPHEN LEVEL: Acetaminophen (Tylenol), Serum: <10 ug/mL — ABNORMAL LOW (ref 10–30)

## 2015-04-20 LAB — ETHANOL: ALCOHOL ETHYL (B): 288 mg/dL — AB (ref <5)

## 2015-04-20 LAB — SALICYLATE LEVEL: Salicylate Lvl: <4 mg/dL (ref 2.8–30.0)

## 2015-04-20 MED ORDER — IBUPROFEN 200 MG PO TABS: 600.0000 mg | ORAL_TABLET | Freq: Three times a day (TID) | ORAL | Status: DC | PRN

## 2015-04-20 MED ORDER — LORAZEPAM 2 MG/ML IJ SOLN
2.0000 mg | Freq: Once | INTRAMUSCULAR | Status: AC
  Administered 2015-04-20: 2 mg via INTRAMUSCULAR
  Filled 2015-04-20: qty 1

## 2015-04-20 MED ORDER — ZIPRASIDONE MESYLATE 20 MG IM SOLR
20.0000 mg | Freq: Once | INTRAMUSCULAR | Status: AC
  Administered 2015-04-20: 20 mg via INTRAMUSCULAR

## 2015-04-20 MED ORDER — ONDANSETRON HCL 4 MG PO TABS: 4.0000 mg | ORAL_TABLET | Freq: Three times a day (TID) | ORAL | Status: DC | PRN

## 2015-04-20 MED ORDER — DIPHENHYDRAMINE HCL 50 MG/ML IJ SOLN
50.0000 mg | Freq: Once | INTRAMUSCULAR | Status: AC
  Administered 2015-04-20: 50 mg via INTRAMUSCULAR
  Filled 2015-04-20: qty 1

## 2015-04-20 MED ORDER — ACETAMINOPHEN 325 MG PO TABS: 650.0000 mg | ORAL_TABLET | ORAL | Status: DC | PRN

## 2015-04-20 MED ORDER — LORAZEPAM 1 MG PO TABS
1.0000 mg | ORAL_TABLET | Freq: Three times a day (TID) | ORAL | Status: DC | PRN
  Administered 2015-04-21 (×2): 1 mg via ORAL
  Filled 2015-04-20 (×2): qty 1

## 2015-04-20 NOTE — ED Notes (Signed)
Patient and belongings were wanded before patient was brought back

## 2015-04-20 NOTE — ED Notes (Signed)
Pt very agitated stating she is going to bang her head against the wall.

## 2015-04-20 NOTE — ED Notes (Signed)
Unable to collect labs at this time because of patient behavior. 

## 2015-04-20 NOTE — ED Notes (Signed)
Pt claims drinking "half of a fifth about an hour or so ago" Pt is SI but denies HI.
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
## 2015-04-20 NOTE — ED Notes (Signed)
Pt presents with SI, plan to bang her head against the wall, pt screaming stating she wants to leave the facility.  Pt under IVC, papers taken out by mother.  [employer] and security at bedside for assistance.  Pt AAO x 3, complaint of headache, no acute distress noted, monitoring for safety, Q15 min checks in effect.
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
## 2015-04-20 NOTE — ED Notes (Signed)
Pt resting at present ?

## 2015-04-20 NOTE — ED Notes (Signed)
Pt's mom called and said that she has an alcohol problem and was drinking tonight and became very aggressive and threatened to kill herself, she got in her mom's face and said she didn't care anymore, mom is taking out IVC papers. Pt has not been taking her medications per mom.

## 2015-04-20 NOTE — BH Assessment (Signed)
TTS Assessment was in progress but had to be discontinued due to pt agitation and pt's increasing threats and gestures of imminent self harm by hitting the wall with her fist. Pt was sitting looking at the wall and moving fingers over the wall while stating how much she wanted to hit the wall with her fist and crying because she was struggling to restrain herself.  Pt made verbal threats to do so because she "wanted to see her mom" but was informed by her nurse that her mother was not coming to the ED tonight. Per nurse, mother is seeking IVC for pt.   Beryle Flock, MS, CRC, [facility] Triage Specialist [facility]

## 2015-04-21 DIAGNOSIS — F19288 Other psychoactive substance dependence with other psychoactive substance-induced disorder: Secondary | ICD-10-CM

## 2015-04-21 DIAGNOSIS — R45851 Suicidal ideations: Secondary | ICD-10-CM | POA: Insufficient documentation

## 2015-04-21 DIAGNOSIS — F332 Major depressive disorder, recurrent severe without psychotic features: Secondary | ICD-10-CM | POA: Diagnosis not present

## 2015-04-21 DIAGNOSIS — F1099 Alcohol use, unspecified with unspecified alcohol-induced disorder: Secondary | ICD-10-CM | POA: Diagnosis not present

## 2015-04-21 MED ORDER — QUETIAPINE FUMARATE 100 MG PO TABS: 200.0000 mg | ORAL_TABLET | Freq: Every day | ORAL | Status: DC

## 2015-04-21 MED ORDER — PRAZOSIN HCL 1 MG PO CAPS
1.0000 mg | ORAL_CAPSULE | Freq: Every day | ORAL | Status: DC
  Filled 2015-04-21: qty 1

## 2015-04-21 MED ORDER — FERROUS SULFATE 325 (65 FE) MG PO TABS
325.0000 mg | ORAL_TABLET | Freq: Every day | ORAL | Status: DC
  Filled 2015-04-21: qty 1

## 2015-04-21 MED ORDER — ACAMPROSATE CALCIUM 333 MG PO TBEC
666.0000 mg | DELAYED_RELEASE_TABLET | Freq: Three times a day (TID) | ORAL | Status: DC
  Administered 2015-04-21 (×2): 666 mg via ORAL
  Filled 2015-04-21 (×3): qty 2

## 2015-04-21 MED ORDER — GABAPENTIN 300 MG PO CAPS
600.0000 mg | ORAL_CAPSULE | Freq: Four times a day (QID) | ORAL | Status: DC
  Administered 2015-04-21: 600 mg via ORAL
  Filled 2015-04-21: qty 2

## 2015-04-21 MED ORDER — GABAPENTIN 600 MG PO TABS
600.0000 mg | ORAL_TABLET | Freq: Four times a day (QID) | ORAL | Status: DC
  Filled 2015-04-21 (×3): qty 1

## 2015-04-21 MED ORDER — HYDROXYZINE HCL 25 MG PO TABS
50.0000 mg | ORAL_TABLET | Freq: Four times a day (QID) | ORAL | Status: DC | PRN
  Administered 2015-04-21: 50 mg via ORAL
  Filled 2015-04-21: qty 2

## 2015-04-21 MED ORDER — PANTOPRAZOLE SODIUM 40 MG PO TBEC
40.0000 mg | DELAYED_RELEASE_TABLET | Freq: Every day | ORAL | Status: DC
  Administered 2015-04-21: 40 mg via ORAL
  Filled 2015-04-21: qty 1

## 2015-04-21 MED ORDER — FLUOXETINE HCL 20 MG PO CAPS
20.0000 mg | ORAL_CAPSULE | Freq: Every day | ORAL | Status: DC
  Administered 2015-04-21: 20 mg via ORAL
  Filled 2015-04-21: qty 1

## 2015-04-21 NOTE — ED Notes (Signed)
Pt complaining of panic attack.  Breathing hard and c/o inability to breath.  Encouraged pt to take slow deep breaths and medicated as ordered.

## 2015-04-21 NOTE — BH Assessment (Signed)
[facility] Assessment Progress Note  Per Christiane Ha at [facility], this pt has been accepted to their facility by Dr. Wendall Stade.  Pt will go to the [facility].  Please call report to [phone].  Julieanne Cotton concurs with this decision.  Pt's nurse, Kendal Hymen, has been notified.  Doylene Canning, MA Triage Specialist [phone]
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
## 2015-04-21 NOTE — ED Notes (Signed)
Pt. Crying.  Very shaky and anxious.  Saying " I dont want to go to another hospital"

## 2015-04-21 NOTE — Consult Note (Signed)
[facility] Consult   Reason for Consult:  Alcohol use disorder, Moderate, Agitation, Aggression  Referring Physician:  EDP Patient Identification: Beth [name] MRN:  [id number] Principal Diagnosis: Alcohol use disorder, Moderate Diagnosis:   Patient Active Problem List   Diagnosis Date Noted  . Major depressive disorder, recurrent, severe without psychotic features [F33.2]   . Polysubstance dependence [F19.20]   . Substance induced mood disorder [F19.94]   . Substance abuse [F19.10] 03/11/2015  . 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 Alcohol use disorder, moderate, dependence [F10.20] 01/13/2015  . Free intraperitoneal air [K66.8] 08/18/2014  . Perforated ulcer [K27.5] 08/18/2014  . 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 Acid reflux [K21.9]   . History of blood transfusion [Z92.89]   . Multiple sclerosis [G35] 03/13/2012  . H/O gastric bypass [Z98.84] 03/13/2012    Total Time spent with patient: 1 hour  Subjective:  Beth [name] is a 34 y.o. female patient admitted with . Alcohol use disorder, Moderate, Agitation, Aggression  HPI:  Caucasian female, 34 years old was evaluated for agitation, aggression after drinking Alcohol and getting intoxicated.  Patient was IVC by her parents when she started punching the walls, threatened to bang her head on the wall.  Patient today did not remember what she did yesterday.  Sh reported getting intoxicated after using Alcohol.  She reported that she has had problems with Alcohol and other substances but stated that she has been clean for 6 weeks.  She is supposed to see Dr Adele Schilder July 15th.  Patient stated that she became angry yesterday when her friend was admitted to [facility].  She stated that she was lied to by her friends counselor.  Patient stated that she drank too much Alcohol to deal with the anger.  Patient requested  to be admitted to [facility] so she can be with her friend.  Patient has been accepted for admission and has a bed at [facility].  HPI Elements:   Location:  Alcohol use disorder, Moderate, Major depressive disorder, recurrent, severe without Psychosis, . Quality:  Severe, threatened to hurt self.. Severity:  severe. Timing:  Acute. Duration:  Chronic mental illness and Sustance abuse. Context:  IVC by parents for agiatation..  Past Medical History:  Past Medical History  Diagnosis Date  . Acid reflux   . Asthma   . History of blood transfusion   . Multiple sclerosis 2005    diagnosed in [place], failed steroids, no other medication  . Anxiety   . Gastric ulcer   . Migraine   . Chronic nausea     self reported  . Chronic diarrhea     self reported  . Chronic pain     self reported chronic pain, back pain, fibromyalgia  . Insomnia   . Polysubstance abuse   . ETOH abuse   . Delirium tremens   . Depression     Past Surgical History  Procedure Laterality Date  . Gastric bypass    . Diagnostic laparoscopy  2003    repair of perforation at [facility] anastamosis   . Lumbar laminectomy/decompression microdiscectomy Right 04/14/2014    Procedure: LUMBAR LAMINECTOMY/DECOMPRESSION MICRODISCECTOMY LEVEL L4-5;  Surgeon: Erline Levine, MD;  Location: [facility];  Service: Neurosurgery;  Laterality: Right;  LUMBAR LAMINECTOMY/DECOMPRESSION MICRODISCECTOMY LEVEL L4-5  . Laparotomy N/A 08/18/2014    Procedure: EXPLORATORY LAPAROTOMY, Patch graft of gastric perforation;  Surgeon: Coralie Keens, MD;  Location: [facility] OR;  Service: General;  Laterality: N/A;   Family History:  Family History  Problem Relation Age of Onset  . Hypertension Paternal Grandfather   . Colon cancer Paternal Grandfather 50  . Asthma Paternal Grandmother   . Hypertension Paternal Grandmother   . Breast cancer Paternal Grandmother   . Cancer Paternal Grandmother     lung   . Diabetes Maternal Grandmother   . Diabetes  Maternal Grandfather   . Hypertension Father   . Alcohol abuse Father   . Urinary tract infection Mother   . Stroke Mother 69    3 strokes    Social History:  History  Alcohol Use  . 195.6 oz/week  . 25 Cans of beer, 301 Shots of liquor per week    Comment: Says she can drink up to a 1/2 gallon/day     History  Drug Use No    Comment: cocaine-none >1 year.  meth-none >10 years.    History   Social History  . Marital Status: Single    Spouse Name: N/A  . Number of Children: N/A  . Years of Education: N/A   Social History Main Topics  . Smoking status: Current Some Day Smoker -- 0.10 packs/day    Types: Cigarettes  . Smokeless tobacco: Never Used  . Alcohol Use: 195.6 oz/week    25 Cans of beer, 301 Shots of liquor per week     Comment: Says she can drink up to a 1/2 gallon/day  . Drug Use: No     Comment: cocaine-none >1 year.  meth-none >10 years.  . Sexual Activity: Yes    Birth Control/ Protection: None   Other Topics Concern  . None   Social History Narrative   Additional Social History:    Pain Medications: SEE MAR Prescriptions: SEE MAR Over the Counter: SEE MAR History of alcohol / drug use?: Yes Longest period of sobriety (when/how long): 6-7 weeks  Name of Substance 1: Alcohol  1 - Age of First Use: 34 yrs old  1 - Amount (size/oz): 1/2 gallon to 1 gallon of liqour 1 - Frequency: 1x use in 6-7 weeks 1 - Duration: on-going  1 - Last Use / Amount: 04/20/2015; fifth of liqour                   Allergies:   Allergies  Allergen Reactions  . Cymbalta [Duloxetine Hcl] Other (See Comments)    Made body jerk and twitch, spasms  . Dilaudid [Hydromorphone Hcl] Other (See Comments)    Anger, aggression   . Prednisone Other (See Comments)    Steroids in general    Labs:  Results for orders placed or performed during the hospital encounter of 04/20/15 (from the past 48 hour(s))  Urine rapid drug screen (hosp performed)not at [facility]     Status:  Abnormal   Collection Time: 04/20/15  9:10 PM  Result Value Ref Range   Opiates NONE DETECTED NONE DETECTED   Cocaine POSITIVE (A) NONE DETECTED   Benzodiazepines POSITIVE (A) NONE DETECTED   Amphetamines NONE DETECTED NONE DETECTED   Tetrahydrocannabinol NONE DETECTED NONE DETECTED   Barbiturates NONE DETECTED NONE DETECTED    Comment:        DRUG SCREEN FOR MEDICAL PURPOSES ONLY.  IF CONFIRMATION IS NEEDED FOR ANY PURPOSE, NOTIFY LAB WITHIN 5 DAYS.        LOWEST DETECTABLE LIMITS FOR URINE DRUG SCREEN Drug Class       Cutoff (ng/mL)  Amphetamine      1000 Barbiturate      200 Benzodiazepine   324 Tricyclics       401 Opiates          300 Cocaine          300 THC              50   Acetaminophen level     Status: Abnormal   Collection Time: 04/20/15  9:41 PM  Result Value Ref Range   Acetaminophen (Tylenol), Serum <10 (L) 10 - 30 ug/mL    Comment:        THERAPEUTIC CONCENTRATIONS VARY SIGNIFICANTLY. A RANGE OF 10-30 ug/mL MAY BE AN EFFECTIVE CONCENTRATION FOR MANY PATIENTS. HOWEVER, SOME ARE BEST TREATED AT CONCENTRATIONS OUTSIDE THIS RANGE. ACETAMINOPHEN CONCENTRATIONS >150 ug/mL AT 4 HOURS AFTER INGESTION AND >50 ug/mL AT 12 HOURS AFTER INGESTION ARE OFTEN ASSOCIATED WITH TOXIC REACTIONS.   Ethanol (ETOH)     Status: Abnormal   Collection Time: 04/20/15  9:41 PM  Result Value Ref Range   Alcohol, Ethyl (B) 288 (H) <5 mg/dL    Comment:        LOWEST DETECTABLE LIMIT FOR SERUM ALCOHOL IS 5 mg/dL FOR MEDICAL PURPOSES ONLY   Salicylate level     Status: None   Collection Time: 04/20/15  9:41 PM  Result Value Ref Range   Salicylate Lvl <0.2 2.8 - 30.0 mg/dL  CBC     Status: Abnormal   Collection Time: 04/20/15  9:42 PM  Result Value Ref Range   WBC 9.2 4.0 - 10.5 K/uL   RBC 5.52 (H) 3.87 - 5.11 MIL/uL   Hemoglobin 14.8 12.0 - 15.0 g/dL   HCT 44.2 36.0 - 46.0 %   MCV 80.1 78.0 - 100.0 fL   MCH 26.8 26.0 - 34.0 pg   MCHC 33.5 30.0 - 36.0 g/dL   RDW 16.7  (H) 11.5 - 15.5 %   Platelets 394 150 - 400 K/uL  Comprehensive metabolic panel     Status: Abnormal   Collection Time: 04/20/15  9:42 PM  Result Value Ref Range   Sodium 146 (H) 135 - 145 mmol/L   Potassium 3.9 3.5 - 5.1 mmol/L   Chloride 111 101 - 111 mmol/L   CO2 23 22 - 32 mmol/L   Glucose, Bld 96 65 - 99 mg/dL   BUN 8 6 - 20 mg/dL   Creatinine, Ser 0.83 0.44 - 1.00 mg/dL   Calcium 9.0 8.9 - 10.3 mg/dL   Total Protein 8.3 (H) 6.5 - 8.1 g/dL   Albumin 4.2 3.5 - 5.0 g/dL   AST 22 15 - 41 U/L   ALT 17 14 - 54 U/L   Alkaline Phosphatase 82 38 - 126 U/L   Total Bilirubin 0.3 0.3 - 1.2 mg/dL   GFR calc non Af Amer >60 >60 mL/min   GFR calc Af Amer >60 >60 mL/min    Comment: (NOTE) The eGFR has been calculated using the CKD EPI equation. This calculation has not been validated in all clinical situations. eGFR's persistently <60 mL/min signify possible Chronic Kidney Disease.    Anion gap 12 5 - 15    Vitals: Blood pressure 100/59, pulse 82, temperature 97.8 F (36.6 C), temperature source Oral, resp. rate 18, last menstrual period 04/20/2015, SpO2 100 %.  Risk to Self: Suicidal Ideation: Yes-Currently Present Suicidal Intent: No Is patient at risk for suicide?: No Suicidal Plan?: No Specify Current Suicidal Plan:  (No  plan) Access to Means: No Specify Access to Suicidal Means:  (None ) What has been your use of drugs/alcohol within the last 12 months?:  (Abusing cocaine, thc, and alcohol ) How many times?:  (0) Other Self Harm Risks:  (None reported) Triggers for Past Attempts: None known Intentional Self Injurious Behavior: None Risk to Others: Homicidal Ideation: No Thoughts of Harm to Others: No Current Homicidal Intent: No Current Homicidal Plan: No Access to Homicidal Means: No Identified Victim:  (n/a) History of harm to others?: No Assessment of Violence: None Noted Violent Behavior Description:  (None reported) Does patient have access to weapons?:  No Criminal Charges Pending?: No Describe Pending Criminal Charges:  (n/a) Does patient have a court date: No Court Date:  (None Reported) Prior Inpatient Therapy: Prior Inpatient Therapy: Yes Prior Therapy Dates: 2015,2016 Prior Therapy Facilty[facility]Provider[facility]  Reason for Treatment: SA, Anxiety  Prior Outpatient Therapy: Prior Outpatient Therapy: No Prior Therapy Dates: None  Prior Therapy Facilty/Provider(s): None  Reason for Treatment: None  Does patient have an ACCT team?: No Does patient have Intensive In-House Services?  : No Does patient have [facility] services? : No Does patient have P4CC services?: No  Current Facility-Administered Medications  Medication Dose Route Frequency Provider Last Rate Last Dose  . acamprosate (CAMPRAL) tablet 666 mg  666 mg Oral TID Jillienne Egner   666 mg at 04/21/15 1157  . [START ON 04/22/2015] ferrous sulfate tablet 325 mg  325 mg Oral Q breakfast Amair Shrout      . FLUoxetine (PROZAC) capsule 20 mg  20 mg Oral Daily Bates Collington   20 mg at 04/21/15 1155  . gabapentin (NEURONTIN) capsule 600 mg  600 mg Oral QID Porsche Noguchi      . hydrOXYzine (ATARAX/VISTARIL) tablet 50 mg  50 mg Oral Q6H PRN Darleth Eustache   50 mg at 04/21/15 1155  . ibuprofen (ADVIL,MOTRIN) tablet 600 mg  600 mg Oral Q8H PRN Alvina Chou, PA-C      . LORazepam (ATIVAN) tablet 1 mg  1 mg Oral Q8H PRN Alvina Chou, PA-C   1 mg at 04/21/15 0820  . ondansetron (ZOFRAN) tablet 4 mg  4 mg Oral Q8H PRN Kaitlyn Szekalski, PA-C      . pantoprazole (PROTONIX) EC tablet 40 mg  40 mg Oral Daily Saunders [place]   40 mg at 04/21/15 1155  . prazosin (MINIPRESS) capsule 1 mg  1 mg Oral QHS Beza Steppe      . QUEtiapine (SEROQUEL) tablet 200 mg  200 mg Oral QHS Donelle Hise       Current Outpatient Prescriptions  Medication Sig Dispense Refill  . acamprosate (CAMPRAL) 333 MG tablet Take 2 tablets (666 mg total) by mouth 3 (three) times daily with  meals. For alcoholism, 180 tablet 0  . ferrous sulfate 325 (65 FE) MG tablet Take 325 mg by mouth daily with breakfast.    . gabapentin (NEURONTIN) 600 MG tablet Take 1 tablet (600 mg total) by mouth 4 (four) times daily. agitation 120 tablet 0  . hydrOXYzine (ATARAX/VISTARIL) 25 MG tablet Take 1 tablet (25 mg total) by mouth every 6 (six) hours as needed for anxiety. 45 tablet 0  . ketorolac (TORADOL) 10 MG tablet Take 10 mg by mouth every 6 (six) hours as needed for moderate pain or severe pain.    Marland Kitchen lamoTRIgine (LAMICTAL) 25 MG tablet Take 1 tablet (25 mg total) by mouth 2 (two) times daily. For mood stabilization 60 tablet  0  . methocarbamol (ROBAXIN) 750 MG tablet TAKE 1 TABLET BY MOUTH 4 TIMES A DAY FOR MUSCLE SPASM (MUST CONTACT PCP FOR REFILLS)  0  . Multiple Vitamins-Minerals (MULTIVITAMIN & MINERAL PO) Take 1 tablet by mouth daily.    Marland Kitchen omeprazole (PRILOSEC) 20 MG capsule Take 20 mg by mouth daily.    . prazosin (MINIPRESS) 1 MG capsule Take 1 capsule (1 mg total) by mouth at bedtime. For nightmares 30 capsule 0  . QUEtiapine (SEROQUEL) 200 MG tablet Take 1 tablet (200 mg total) by mouth at bedtime. For mood control 30 tablet 0  . QUEtiapine (SEROQUEL) 25 MG tablet Take 3 tablets (75 mg total) by mouth 3 (three) times daily. For agitation 60 tablet 0  . SUMAtriptan (IMITREX) 50 MG tablet Take 1 tablet (50 mg total) by mouth every 2 (two) hours as needed for migraine or headache. 10 tablet 0  . methocarbamol (ROBAXIN) 500 MG tablet Take 750 mg (1.5 tablets) four times daily: For muscle pain (Patient not taking: Reported on 04/20/2015) 28 tablet 0    Musculoskeletal: Strength & Muscle Tone: within normal limits Gait & Station: normal Patient leans: N/A  Psychiatric Specialty Exam: Physical Exam  Review of Systems  Constitutional: Negative.   HENT: Negative.   Eyes: Negative.   Respiratory: Negative.   Cardiovascular: Negative.   Gastrointestinal: Negative.   Genitourinary:  Negative.   Musculoskeletal: Negative.   Skin: Negative.   Neurological: Negative.   Endo/Heme/Allergies: Negative.     Blood pressure 100/59, pulse 82, temperature 97.8 F (36.6 C), temperature source Oral, resp. rate 18, last menstrual period 04/20/2015, SpO2 100 %.There is no weight on file to calculate BMI.  General Appearance: Casual  Eye Contact::  Minimal  Speech:  Clear and Coherent and Slow  Volume:  Normal  Mood:  Angry, Anxious, Depressed and Irritable  Affect:  Congruent, Depressed and Flat  Thought Process:  Coherent, Goal Directed and Intact  Orientation:  Full (Time, Place, and Person)  Thought Content:  WDL  Suicidal Thoughts:  No  Homicidal Thoughts:  No  Memory:  Immediate;   Poor Recent;   Poor Remote;   Poor  Judgement:  Impaired  Insight:  Shallow  Psychomotor Activity:  Decreased and Psychomotor Retardation  Concentration:  Fair  Recall:  NA  Fund of Knowledge:Poor  Language: Fair  Akathisia:  NA  Handed:  Right  AIMS (if indicated):     Assets:  Desire for Improvement  ADL's:  Intact  Cognition: WNL  Sleep:      Medical Decision Making: Review of Psycho-Social Stressors (1)   Disposition: [facility]ED [facility] and waiting for transportation.  Delfin Gant  04/21/2015 3:08 PM Patient seen face-to-face for psychiatric evaluation, chart reviewed and case discussed with the physician extender and developed treatment plan. Reviewed the information documented and agree with the treatment plan. Corena Pilgrim, MD

## 2015-04-22 ENCOUNTER — Other Ambulatory Visit (HOSPITAL_COMMUNITY): Payer: BLUE CROSS/BLUE SHIELD

## 2015-04-25 ENCOUNTER — Other Ambulatory Visit (HOSPITAL_COMMUNITY): Payer: BLUE CROSS/BLUE SHIELD

## 2015-04-27 ENCOUNTER — Other Ambulatory Visit (HOSPITAL_COMMUNITY): Payer: BLUE CROSS/BLUE SHIELD

## 2015-04-29 ENCOUNTER — Other Ambulatory Visit (HOSPITAL_COMMUNITY): Payer: BLUE CROSS/BLUE SHIELD

## 2015-05-04 ENCOUNTER — Other Ambulatory Visit (HOSPITAL_COMMUNITY): Payer: BLUE CROSS/BLUE SHIELD

## 2015-05-06 ENCOUNTER — Other Ambulatory Visit (HOSPITAL_COMMUNITY): Payer: BLUE CROSS/BLUE SHIELD

## 2015-05-09 ENCOUNTER — Other Ambulatory Visit (HOSPITAL_COMMUNITY): Payer: BLUE CROSS/BLUE SHIELD

## 2015-05-11 ENCOUNTER — Other Ambulatory Visit (HOSPITAL_COMMUNITY): Payer: BLUE CROSS/BLUE SHIELD

## 2015-05-13 ENCOUNTER — Other Ambulatory Visit (HOSPITAL_COMMUNITY): Payer: BLUE CROSS/BLUE SHIELD

## 2015-05-16 ENCOUNTER — Other Ambulatory Visit (HOSPITAL_COMMUNITY): Payer: BLUE CROSS/BLUE SHIELD

## 2015-05-18 ENCOUNTER — Other Ambulatory Visit (HOSPITAL_COMMUNITY): Payer: BLUE CROSS/BLUE SHIELD

## 2015-05-20 ENCOUNTER — Other Ambulatory Visit (HOSPITAL_COMMUNITY): Payer: BLUE CROSS/BLUE SHIELD

## 2015-05-23 ENCOUNTER — Encounter (HOSPITAL_COMMUNITY): Payer: Self-pay | Admitting: Psychiatry

## 2015-05-23 ENCOUNTER — Ambulatory Visit (INDEPENDENT_AMBULATORY_CARE_PROVIDER_SITE_OTHER): Payer: BLUE CROSS/BLUE SHIELD | Admitting: Psychiatry

## 2015-05-23 ENCOUNTER — Other Ambulatory Visit (HOSPITAL_COMMUNITY): Payer: BLUE CROSS/BLUE SHIELD

## 2015-05-23 ENCOUNTER — Encounter (HOSPITAL_COMMUNITY): Payer: Self-pay | Admitting: General Practice

## 2015-05-23 ENCOUNTER — Inpatient Hospital Stay (HOSPITAL_COMMUNITY)
Admission: AD | Admit: 2015-05-23 | Discharge: 2015-05-30 | DRG: 885 | Disposition: A | Discharge status: Home / Self Care | Payer: BLUE CROSS/BLUE SHIELD | Attending: Psychiatry | Admitting: Psychiatry

## 2015-05-23 VITALS — BP 123/84 | HR 81 | Ht 65.5 in | Wt 259.0 lb

## 2015-05-23 DIAGNOSIS — Z6841 Body Mass Index (BMI) 40.0 and over, adult: Secondary | ICD-10-CM

## 2015-05-23 DIAGNOSIS — F332 Major depressive disorder, recurrent severe without psychotic features: Principal | ICD-10-CM | POA: Diagnosis present

## 2015-05-23 DIAGNOSIS — F1020 Alcohol dependence, uncomplicated: Secondary | ICD-10-CM | POA: Diagnosis not present

## 2015-05-23 DIAGNOSIS — F410 Panic disorder [episodic paroxysmal anxiety] without agoraphobia: Secondary | ICD-10-CM | POA: Diagnosis present

## 2015-05-23 DIAGNOSIS — Z79899 Other long term (current) drug therapy: Secondary | ICD-10-CM

## 2015-05-23 DIAGNOSIS — Z9884 Bariatric surgery status: Secondary | ICD-10-CM

## 2015-05-23 DIAGNOSIS — E669 Obesity, unspecified: Secondary | ICD-10-CM | POA: Diagnosis present

## 2015-05-23 DIAGNOSIS — F4310 Post-traumatic stress disorder, unspecified: Secondary | ICD-10-CM

## 2015-05-23 DIAGNOSIS — F339 Major depressive disorder, recurrent, unspecified: Secondary | ICD-10-CM | POA: Diagnosis not present

## 2015-05-23 DIAGNOSIS — F315 Bipolar disorder, current episode depressed, severe, with psychotic features: Secondary | ICD-10-CM

## 2015-05-23 DIAGNOSIS — F329 Major depressive disorder, single episode, unspecified: Secondary | ICD-10-CM | POA: Diagnosis present

## 2015-05-23 DIAGNOSIS — F333 Major depressive disorder, recurrent, severe with psychotic symptoms: Secondary | ICD-10-CM

## 2015-05-23 DIAGNOSIS — F1920 Other psychoactive substance dependence, uncomplicated: Secondary | ICD-10-CM | POA: Diagnosis not present

## 2015-05-23 DIAGNOSIS — F1120 Opioid dependence, uncomplicated: Secondary | ICD-10-CM

## 2015-05-23 LAB — URINALYSIS, ROUTINE W REFLEX MICROSCOPIC
Bilirubin Urine: NEGATIVE
Glucose, UA: NEGATIVE mg/dL
Ketones, ur: NEGATIVE mg/dL
Leukocytes, UA: NEGATIVE
NITRITE: NEGATIVE
PROTEIN: NEGATIVE mg/dL
SPECIFIC GRAVITY, URINE: 1.018 (ref 1.005–1.030)
Urobilinogen, UA: 1 mg/dL (ref 0.0–1.0)
pH: 5.5 (ref 5.0–8.0)

## 2015-05-23 LAB — RAPID URINE DRUG SCREEN, HOSP PERFORMED
AMPHETAMINES: NOT DETECTED
BARBITURATES: NOT DETECTED
Benzodiazepines: POSITIVE — AB
COCAINE: NOT DETECTED
OPIATES: POSITIVE — AB
Tetrahydrocannabinol: NOT DETECTED

## 2015-05-23 LAB — URINE MICROSCOPIC-ADD ON

## 2015-05-23 MED ORDER — GABAPENTIN 300 MG PO CAPS
600.0000 mg | ORAL_CAPSULE | Freq: Four times a day (QID) | ORAL | Status: DC
  Administered 2015-05-23 – 2015-05-24 (×3): 600 mg via ORAL
  Filled 2015-05-23 (×10): qty 2

## 2015-05-23 MED ORDER — HYDROXYZINE HCL 50 MG PO TABS
50.0000 mg | ORAL_TABLET | Freq: Three times a day (TID) | ORAL | Status: DC | PRN
  Administered 2015-05-23: 50 mg via ORAL
  Filled 2015-05-23: qty 1

## 2015-05-23 MED ORDER — BACITRACIN ZINC 500 UNIT/GM EX OINT
TOPICAL_OINTMENT | Freq: Two times a day (BID) | CUTANEOUS | Status: DC
  Administered 2015-05-23 – 2015-05-28 (×8): via TOPICAL
  Filled 2015-05-23: qty 28.35

## 2015-05-23 MED ORDER — MAGNESIUM HYDROXIDE 400 MG/5ML PO SUSP
30.0000 mL | Freq: Every day | ORAL | Status: DC | PRN
  Filled 2015-05-23: qty 30

## 2015-05-23 MED ORDER — KETOROLAC TROMETHAMINE 10 MG PO TABS
10.0000 mg | ORAL_TABLET | Freq: Four times a day (QID) | ORAL | Status: AC | PRN
  Administered 2015-05-23 – 2015-05-28 (×8): 10 mg via ORAL
  Filled 2015-05-23 (×8): qty 1

## 2015-05-23 MED ORDER — NEOMYCIN-POLYMYXIN-PRAMOXINE 1 % EX CREA
TOPICAL_CREAM | Freq: Two times a day (BID) | CUTANEOUS | Status: DC
  Filled 2015-05-23: qty 28

## 2015-05-23 MED ORDER — ALUM & MAG HYDROXIDE-SIMETH 200-200-20 MG/5ML PO SUSP
30.0000 mL | ORAL | Status: DC | PRN
  Filled 2015-05-23: qty 30

## 2015-05-23 MED ORDER — LORAZEPAM 0.5 MG PO TABS
0.5000 mg | ORAL_TABLET | Freq: Three times a day (TID) | ORAL | Status: DC | PRN
  Administered 2015-05-23 – 2015-05-26 (×8): 0.5 mg via ORAL
  Filled 2015-05-23 (×8): qty 1

## 2015-05-23 MED ORDER — TRAZODONE HCL 150 MG PO TABS
150.0000 mg | ORAL_TABLET | Freq: Every evening | ORAL | Status: DC | PRN
  Administered 2015-05-23: 150 mg via ORAL
  Filled 2015-05-23 (×2): qty 1

## 2015-05-23 MED ORDER — NICOTINE POLACRILEX 2 MG MT GUM
2.0000 mg | CHEWING_GUM | OROMUCOSAL | Status: DC | PRN
  Administered 2015-05-23 – 2015-05-30 (×12): 2 mg via ORAL
  Filled 2015-05-23 (×2): qty 1

## 2015-05-23 MED ORDER — METHOCARBAMOL 500 MG PO TABS
500.0000 mg | ORAL_TABLET | Freq: Four times a day (QID) | ORAL | Status: DC
  Administered 2015-05-23 – 2015-05-30 (×29): 500 mg via ORAL
  Filled 2015-05-23 (×40): qty 1

## 2015-05-23 MED ORDER — SUMATRIPTAN SUCCINATE 50 MG PO TABS
50.0000 mg | ORAL_TABLET | Freq: Two times a day (BID) | ORAL | Status: DC | PRN
  Administered 2015-05-23 – 2015-05-30 (×15): 50 mg via ORAL
  Filled 2015-05-23 (×15): qty 1

## 2015-05-23 MED ORDER — PANTOPRAZOLE SODIUM 20 MG PO TBEC
20.0000 mg | DELAYED_RELEASE_TABLET | Freq: Every day | ORAL | Status: DC
  Administered 2015-05-23 – 2015-05-30 (×8): 20 mg via ORAL
  Filled 2015-05-23 (×12): qty 1

## 2015-05-23 MED ORDER — LAMOTRIGINE 100 MG PO TABS
200.0000 mg | ORAL_TABLET | Freq: Every day | ORAL | Status: DC
  Administered 2015-05-23 – 2015-05-30 (×8): 200 mg via ORAL
  Filled 2015-05-23 (×11): qty 2

## 2015-05-23 MED ORDER — ACETAMINOPHEN 325 MG PO TABS
650.0000 mg | ORAL_TABLET | Freq: Four times a day (QID) | ORAL | Status: DC | PRN
  Filled 2015-05-23: qty 2

## 2015-05-23 MED ORDER — FLUOXETINE HCL 10 MG PO CAPS
10.0000 mg | ORAL_CAPSULE | Freq: Every day | ORAL | Status: DC
  Administered 2015-05-23 – 2015-05-24 (×2): 10 mg via ORAL
  Filled 2015-05-23 (×4): qty 1

## 2015-05-23 MED ORDER — NICOTINE 21 MG/24HR TD PT24
21.0000 mg | MEDICATED_PATCH | Freq: Every day | TRANSDERMAL | Status: DC
  Filled 2015-05-23 (×3): qty 1

## 2015-05-23 MED ORDER — PRAZOSIN HCL 1 MG PO CAPS
1.0000 mg | ORAL_CAPSULE | Freq: Every day | ORAL | Status: DC
  Administered 2015-05-23 – 2015-05-28 (×6): 1 mg via ORAL
  Filled 2015-05-23 (×9): qty 1

## 2015-05-23 MED ORDER — QUETIAPINE FUMARATE 300 MG PO TABS
300.0000 mg | ORAL_TABLET | Freq: Every day | ORAL | Status: DC
  Administered 2015-05-23: 300 mg via ORAL
  Filled 2015-05-23 (×3): qty 1

## 2015-05-23 NOTE — H&P (Signed)
Psychiatric Admission Assessment Adult  Patient Identification: Beth [name]  MRN:  [id number]  Date of Evaluation:  05/23/2015  Chief Complaint:  Alcohol Dependence MDD, severe, recurrent with psychosis PTSD Bipolar Disorder with psychosis  Principal Diagnosis: <principal problem not specified>  Diagnosis:   Patient Active Problem List   Diagnosis Date Noted  . Major depressive disorder, recurrent episode [F33.9] 05/23/2015  . Suicidal ideations [R45.851]   . Major depressive disorder, recurrent, severe without psychotic features [F33.2]   . Polysubstance dependence [F19.20]   . Substance induced mood disorder [F19.94]   . Substance abuse [F19.10] 03/11/2015  . 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 Alcohol use disorder, moderate, dependence [F10.20] 01/13/2015  . Free intraperitoneal air [K66.8] 08/18/2014  . Perforated ulcer [K27.5] 08/18/2014  . 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 Acid reflux [K21.9]   . History of blood transfusion [Z92.89]   . Multiple sclerosis [G35] 03/13/2012  . H/O gastric bypass [Z98.84] 03/13/2012   History of Present Illness: Beth [name] is a 34 year old obese Caucasian female. Has been a patient in this hospital x numerous times. She was here in March, May of this year & recently was a patient/discharged from the [facility]. Blenda has hx of polysubstance abuse issues including alcoholism, cocaine, Benzodiazepine, also Major depressive disorder. This time around, she reports, "I went to my very first appointment with my psychiatrist Dr. Lolly Mustache after getting discharged from the old [facility]. He sent me here to have my medicines regulated. When I was discharged from this hospital in May of this year, Dr. Dub Mikes sent me home on some medication regimen. I was doing well on them till around 04-20-15 thru 04-23-15 when things started  going down again for me. I started to crave alcohol so bad that I will cry if I don't get it. I started drinking again.  I was drinking up to 2 bottles of the 40s & a pint of Vodka daily. Then, I went to the [facility]. They did not do much for me, rather messed up my medicines. Now, I need my medicines re-regulated because they are not working as they are right now. I have daily mood swings, I cry a lot, feeling very depressed. I'm not sleeping at night. I have bad anxiety. I have nightmares about my sister's funeral. She committed suicide & I can't seem to get over her death. 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 But, I promise you that you are talking to me, the real [place]. There is something about the 22nd of the months that does not agree with my mood".  Elements:  Location:  Major depressive disorder, recurrent episode. Quality:  Mood swings, insomnia, alcohol cravings, depressed. Severity:  Severe. Timing:  Current. Duration:  Chronic, continuous. Context:  "My medicines need to be regulated".  Associated Signs/Symptoms:  Depression Symptoms:  depressed mood, suicidal thoughts without plan, anxiety, disturbed sleep,  (Hypo) Manic Symptoms:  Labiality of Mood,  Anxiety Symptoms:  Excessive Worry, Social Anxiety,  Psychotic Symptoms:  Hallucinations: Auditory  PTSD Symptoms: NA Re-experiencing:  Flashbacks Nightmares  Total Time spent with patient: 1 hour  Past Medical History:  Past Medical History  Diagnosis Date  . Acid reflux   . Asthma   . History of blood transfusion   . Multiple sclerosis 2005    diagnosed in [place], failed steroids, no other medication  .  Anxiety   . Gastric ulcer   . Migraine   . Chronic nausea     self reported  . Chronic diarrhea     self reported  . Chronic pain     self reported chronic pain, back pain, fibromyalgia  . Insomnia   . Polysubstance abuse   . ETOH abuse   . Delirium tremens   .  Depression     Past Surgical History  Procedure Laterality Date  . Gastric bypass    . Diagnostic laparoscopy  2003    repair of perforation at [facility] anastamosis   . Lumbar laminectomy/decompression microdiscectomy Right 04/14/2014    Procedure: LUMBAR LAMINECTOMY/DECOMPRESSION MICRODISCECTOMY LEVEL L4-5;  Surgeon: Maeola Harman, MD;  Location: [facility] OR[facility];  Service: Neurosurgery;  Laterality: Right;  LUMBAR LAMINECTOMY/DECOMPRESSION MICRODISCECTOMY LEVEL L4-5  . Laparotomy N/A 08/18/2014    Procedure: EXPLORATORY LAPAROTOMY, Patch graft of gastric perforation;  Surgeon: Abigail Miyamoto, MD;  Location: [facility] OR;  Service: General;  Laterality: N/A;   Family History:  Family History  Problem Relation Age of Onset  . Hypertension Paternal Grandfather   . Colon cancer Paternal Grandfather 11  . Asthma Paternal Grandmother   . Hypertension Paternal Grandmother   . Breast cancer Paternal Grandmother   . Cancer Paternal Grandmother     lung   . Diabetes Maternal Grandmother   . Diabetes Maternal Grandfather   . Hypertension Father   . Alcohol abuse Father   . Urinary tract infection Mother   . Stroke Mother 66    3 strokes    Social History:  History  Alcohol Use  . 195.6 oz/week  . 25 Cans of beer, 301 Shots of liquor per week    Comment: reports shes a "light drinker" now- drinks 1 pt of vodka and 2 24 oz beers     History  Drug Use  . Yes  . Special: Marijuana, Cocaine, Methamphetamines, Oxycodone, Heroin    Comment: cocaine-none >1 year.  meth-none >10 years.    History   Social History  . Marital Status: Single    Spouse Name: N/A  . Number of Children: N/A  . Years of Education: N/A   Social History Main Topics  . Smoking status: Current Some Day Smoker -- 0.50 packs/day    Types: Cigarettes  . Smokeless tobacco: Never Used  . Alcohol Use: 195.6 oz/week    25 Cans of beer, 301 Shots of liquor per week     Comment: reports shes a "light drinker" now- drinks 1 pt of  vodka and 2 24 oz beers  . Drug Use: Yes    Special: Marijuana, Cocaine, Methamphetamines, Oxycodone, Heroin     Comment: cocaine-none >1 year.  meth-none >10 years.  . Sexual Activity: Yes    Birth Control/ Protection: None   Other Topics Concern  . None   Social History Narrative   Additional Social History:  Musculoskeletal: Strength & Muscle Tone: within normal limits Gait & Station: normal Patient leans: N/A  Psychiatric Specialty Exam: Physical Exam  Constitutional: She is oriented to person, place, and time. She appears well-developed.  HENT:  Head: Normocephalic.  Eyes: Pupils are equal, round, and reactive to light.  Cardiovascular: Normal rate.   Respiratory: Effort normal.  GI: Soft.  Genitourinary:  Denies any issues in this area  Musculoskeletal: Normal range of motion.  Neurological: She is alert and oriented to person, place, and time.  Skin: Skin is warm and dry.  Sun burn, chest areas  Psychiatric: Her speech is normal and behavior is normal. Thought content normal. Her mood appears anxious. Her affect is labile. Her affect is not angry, not blunt and not inappropriate. Cognition and memory are normal. She expresses impulsivity. She exhibits a depressed mood.    Review of Systems  Constitutional: Negative.   HENT: Negative.   Eyes: Negative.   Respiratory: Negative.   Cardiovascular: Negative.   Gastrointestinal: Negative.   Genitourinary: Negative.   Skin:       Sun burn, chest area  Neurological: Negative.   Endo/Heme/Allergies: Negative.   Psychiatric/Behavioral: Positive for depression and substance abuse (Alcohol abuse). Negative for suicidal ideas, hallucinations and memory loss. The patient is nervous/anxious and has insomnia.     Blood pressure 139/68, pulse 82, temperature 97.9 F (36.6 C), temperature source Oral, resp. rate 18, height 5' 5.5" (1.664 m), weight 117.028 kg (258 lb).Body mass index is 42.27 kg/(m^2).  General Appearance:  Fairly Groomed and obese  Eye Contact::  Good  Speech:  Normal Rate  Volume:  Normal  Mood:  Anxious and Depressed  Affect:  Depressed, Flat and Tearful  Thought Process:  Coherent and Intact  Orientation:  Full (Time, Place, and Person)  Thought Content:  Rumination and denies any hallucinations, delusional thoughts or paranoia  Suicidal Thoughts:  No  Homicidal Thoughts:  No  Memory:  Immediate;   Fair Recent;   Fair Remote;   Fair  Judgement:  Fair  Insight:  Fair  Psychomotor Activity:  Normal  Concentration:  Good  Recall:  Good  Fund of Knowledge:Good  Language: Good  Akathisia:  Negative  Handed:  Right  AIMS (if indicated):     Assets:  Desire for Improvement Physical Health Resilience Social Support  ADL's:  Intact  Cognition: WNL  Sleep:      Risk to Self: Suicidal Ideation: No-Not Currently/Within Last 6 Months Suicidal Intent: No Is patient at risk for suicide?: No Suicidal Plan?: No Access to Means: No What has been your use of drugs/alcohol within the last 12 months?: heroin and alcohol Intentional Self Injurious Behavior: None Risk to Others: Homicidal Ideation: No Thoughts of Harm to Others: No Current Homicidal Intent: No Current Homicidal Plan: No Access to Homicidal Means: No History of harm to others?: No Assessment of Violence: None Noted Does patient have access to weapons?: No Criminal Charges Pending?: No Does patient have a court date: No  Prior Inpatient Therapy: Prior Inpatient Therapy: Yes Prior Therapy Dates: may 2016  Prior Therapy Facilty[facility]Provider[facility]): [facility] Reason for Treatment: depression, anxiety  Prior Outpatient Therapy: Prior Outpatient Therapy: Yes Prior Therapy Dates: current Prior Therapy Facilty/Provider(s): ongoing Reason for Treatment: anxiety Does patient have an ACCT team?: No Does patient have Intensive In-House Services?  : No Does patient have [facility] services? : No Does patient have P[facility]  services?: Yes  Alcohol Screening: 1. How often do you have a drink containing alcohol?: Monthly or less 2. How many drinks containing alcohol do you have on a typical day when you are drinking?: 10 or more 3. How often do you have six or more drinks on one occasion?: Monthly (pt. reports binge drinking monthly around the 21st-22nd) Preliminary Score: 6 4. How often during the last year have you found that you were not able to stop drinking once you had started?: Monthly 5. How often during the last year have you failed to do what was normally expected from you becasue of drinking?: Monthly 6. How often during the  last year have you needed a first drink in the morning to get yourself going after a heavy drinking session?: Less than monthly 7. How often during the last year have you had a feeling of guilt of remorse after drinking?: Monthly 8. How often during the last year have you been unable to remember what happened the night before because you had been drinking?: Monthly 9. Have you or someone else been injured as a result of your drinking?: Yes, during the last year (pt. reports she fell three months ago) 10. Has a relative or friend or a doctor or another health worker been concerned about your drinking or suggested you cut down?: Yes, during the last year (family) Alcohol Use Disorder Identification Test Final Score (AUDIT): 24 Brief Intervention: Yes  Allergies:   Allergies  Allergen Reactions  . Cymbalta [Duloxetine Hcl] Other (See Comments)    Made body jerk and twitch, spasms  . Dilaudid [Hydromorphone Hcl] Other (See Comments)    Anger, aggression   . Naltrexone Swelling  . Prednisone Other (See Comments)    Steroids in general   Lab Results:  No results found for this or any previous visit (from the past 48 hour(s)).  Current Medications: Current Facility-Administered Medications  Medication Dose Route Frequency Provider Last Rate Last Dose  . acetaminophen (TYLENOL)  tablet 650 mg  650 mg Oral Q6H PRN Sanjuana Kava, NP      . alum & mag hydroxide-simeth (MAALOX/MYLANTA) 200-200-20 MG/5ML suspension 30 mL  30 mL Oral Q4H PRN Sanjuana Kava, NP      . FLUoxetine (PROZAC) capsule 10 mg  10 mg Oral Daily Cleotis Nipper, MD   10 mg at 05/23/15 1419  . hydrOXYzine (ATARAX/VISTARIL) tablet 50 mg  50 mg Oral TID PRN Cleotis Nipper, MD      . lamoTRIgine (LAMICTAL) tablet 200 mg  200 mg Oral Daily Cleotis Nipper, MD   200 mg at 05/23/15 1419  . LORazepam (ATIVAN) tablet 0.5 mg  0.5 mg Oral TID PRN Cleotis Nipper, MD   0.5 mg at 05/23/15 1419  . magnesium hydroxide (MILK OF MAGNESIA) suspension 30 mL  30 mL Oral Daily PRN Sanjuana Kava, NP      . nicotine polacrilex (NICORETTE) gum 2 mg  2 mg Oral PRN Rachael Fee, MD   2 mg at 05/23/15 1420  . prazosin (MINIPRESS) capsule 1 mg  1 mg Oral QHS Cleotis Nipper, MD      . QUEtiapine (SEROQUEL) tablet 300 mg  300 mg Oral QHS Cleotis Nipper, MD       PTA Medications: Prescriptions prior to admission  Medication Sig Dispense Refill Last Dose  . acamprosate (CAMPRAL) 333 MG tablet Take 2 tablets (666 mg total) by mouth 3 (three) times daily with meals. For alcoholism, 180 tablet 0 04/20/2015 at 0800  . ferrous sulfate 325 (65 FE) MG tablet Take 325 mg by mouth daily with breakfast.   04/20/2015 at Unknown time  . gabapentin (NEURONTIN) 600 MG tablet Take 1 tablet (600 mg total) by mouth 4 (four) times daily. agitation 120 tablet 0 04/20/2015 at 0800  . hydrOXYzine (ATARAX/VISTARIL) 25 MG tablet Take 1 tablet (25 mg total) by mouth every 6 (six) hours as needed for anxiety. 45 tablet 0 unknown at unknown time  . hydrOXYzine (VISTARIL) 50 MG capsule 200 mg. Take 4 capsule daily  0   . lamoTRIgine (LAMICTAL) 100 MG tablet 200 mg.  0   .  methocarbamol (ROBAXIN) 500 MG tablet Take 750 mg (1.5 tablets) four times daily: For muscle pain (Patient not taking: Reported on 04/20/2015) 28 tablet 0 Not Taking at 0800  . Multiple Vitamins-Minerals  (MULTIVITAMIN & MINERAL PO) Take 1 tablet by mouth daily.   04/20/2015 at Unknown time  . omeprazole (PRILOSEC) 20 MG capsule Take 20 mg by mouth daily.   04/20/2015 at Unknown time  . prazosin (MINIPRESS) 1 MG capsule Take 1 capsule (1 mg total) by mouth at bedtime. For nightmares 30 capsule 0 04/19/2015 at Unknown time  . QUEtiapine (SEROQUEL) 100 MG tablet TAKE 1 TABLET BY MOUTH 3 TIMES A DAY FOR AGITATION  2   . QUEtiapine (SEROQUEL) 300 MG tablet Take 300 mg by mouth at bedtime.  0   . SUMAtriptan (IMITREX) 50 MG tablet Take 1 tablet (50 mg total) by mouth every 2 (two) hours as needed for migraine or headache. 10 tablet 0 Past Month at Unknown time  . traZODone (DESYREL) 150 MG tablet Take 150 mg by mouth at bedtime as needed. for sleep  0    Previous Psychotropic Medications: Yes   Substance Abuse History in the last 12 months:  Yes.   (Alcohol)  Consequences of Substance Abuse: Medical Consequences:  Liver damage, Possible death by overdose Legal Consequences:  Arrests, jail time, Loss of driving privilege. Family Consequences:  Family discord, divorce and or separation.  No results found for this or any previous visit (from the past 72 hour(s)).  Observation Level/Precautions:  15 minute checks  Laboratory:  CBC Chemistry Profile HbAIC HCG UDS UA Lipid panel  Psychotherapy:  Group sessions  Medications: Fluoxetine 10 mg,  Prazosin 1 mg, Quetiapine 300 mg, Lorazepam 0.5 mg, Lamictal 200 mg, Hydroxyzine 50 mg, Nicotine patch.  Consultations:  As needed  Discharge Concerns:  Safety  Estimated LOS:  2-7 days  Other:     Psychological Evaluations: Yes   Treatment Plan/Recommendations: 1. Admit for crisis management and stabilization, estimated length of stay 3-5 days.  2. Medication management to reduce current symptoms to base line and improve the patient's overall level of functioning; Will resume: Fluoxetine 10 mg for depression,  Prazosin 1 mg for nightmares, Quetiapine 300  mg for mood control, Lorazepam 0.5 mg for anxiety, Lamictal 200 mg for mood stabilization, Hydroxyzine 50 mg for anxiety, Nicotine patch for nicotine withdrawal symptoms. 3. Treat health problems as indicated.  4. Develop treatment plan to decrease risk of relapse upon discharge and the need for readmission.  5. Psycho-social education regarding relapse prevention and self care.  6. Health care follow up as needed for medical problems.  7. Review, reconcile, and reinstate any pertinent home medications for other health issues where appropriate. 8. Call for consults with hospitalist for any additional specialty patient care services as needed.  Medical Decision Making:  New problem, with additional work up planned, Review of Psycho-Social Stressors (1), Review or order clinical lab tests (1), Review and summation of old records (2), Review of Medication Regimen & Side Effects (2) and Review of New Medication or Change in Dosage (2)  I certify that inpatient services furnished can reasonably be expected to improve the patient's condition.   Sanjuana Kava [facility], FNP-BC 7/25/20162:25 PM  I personally assessed the patient, reviewed the physical exam and labs and formulated the treatment plan Madie Reno A. Dub Mikes, M.D.

## 2015-05-23 NOTE — Progress Notes (Signed)
Report received from admission RN Christa; Assumed pt care at this time. D: Patient is alert and oriented. Pt is requesting to be moved to 300 hall and is requesting to be seen by MD [place] while at [facility]. Pt C/O anxiety and nicotine craving/smoking cessation this afternoon. Pt C/O migraine this evening, 8/10 which decreased with PRN medication. Pt is attending unit groups. A: Active listening by RN. Encouragement/Support provided to pt. Charge RN made aware of pt's request, pt moved to 300 hall. Urine specimen collected per providers orders and placed in specimen refrigerator and awaiting lab pick up. Medication education reviewed with pt. [facility] contacted RN this evening, reports need to move lab draw to tomorrow morning d/t need for pt to be fasting prior to draw. PRN medication administered for anxiety, nicotine craving/smoking cessation, and migraine per providers orders (See MAR). Scheduled medications administered per providers orders (See MAR). 15 minute checks continued per protocol for patient safety.  R: Patient cooperative and receptive to nursing interventions. Pt remains safe.
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
## 2015-05-23 NOTE — Progress Notes (Signed)
Patient ID: Beth [name], female   DOB: 1981/06/30, 34 y.o.   MRN: [id number]  [name] is a 34 year old caucasian female admitted to [facility] voluntarily for suicidal ideations with no plan, medication adjustment, and substance abuse. Patient reports she been to [facility] before. She appears depressed and somewhat anxious during admission. She denies SI/HI and A/V hallucinations. She has a past medical history most notably gastric bypass surgery, asthma, substance abuse, anxiety, depression, Bipolar 1, and PTSD. See H&P for complete list. She reports binge drinking around this time of the month due to her sister's suicide date which happened in 2013/ She reports she also has a birthday coming up and she reports her menstrual cycle occurs around this time. She reports she drinks usally 2 24 oz beers and a pint of liquor (vodka). She also reports using heroin two days ago but does not believe that this is one of her substance abuse issues. Patient minimizes this during admission. Patient verbalized understanding of admission process and was oriented to the unit. She reported feeling anxious on the 500 hall. Patient was moved to the 300 hall. Q15 minute safety checks were initiated and are maintained.

## 2015-05-23 NOTE — Tx Team (Signed)
Initial Interdisciplinary Treatment Plan   PATIENT STRESSORS: Medication change or noncompliance Substance abuse   PATIENT STRENGTHS: Communication skills General fund of knowledge   PROBLEM LIST: Problem List/Patient Goals Date to be addressed Date deferred Reason deferred Estimated date of resolution  Risk for suicide 05/23/2015           Depression 05/23/2015           Substance Abuse 05/23/2015           "Medication Adjustment" 05/23/2015           "Find out more about depression" 05/23/2015      DISCHARGE CRITERIA:  Improved stabilization in mood, thinking, and/or behavior Motivation to continue treatment in a less acute level of care Verbal commitment to aftercare and medication compliance  PRELIMINARY DISCHARGE PLAN: Attend PHP/IOP Outpatient therapy  PATIENT/FAMIILY INVOLVEMENT: This treatment plan has been presented to and reviewed with the patient, Beth [name].  The patient and family have been given the opportunity to ask questions and make suggestions.  Marzetta Board E 05/23/2015, 4:55 PM
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
## 2015-05-23 NOTE — Progress Notes (Signed)
Pt attended the evening AA speaker meeting. 
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
## 2015-05-24 LAB — COMPREHENSIVE METABOLIC PANEL
ALBUMIN: 3.7 g/dL (ref 3.5–5.0)
ALT: 17 U/L (ref 14–54)
AST: 22 U/L (ref 15–41)
Alkaline Phosphatase: 78 U/L (ref 38–126)
Anion gap: 10 (ref 5–15)
BILIRUBIN TOTAL: 0.3 mg/dL (ref 0.3–1.2)
BUN: 10 mg/dL (ref 6–20)
CO2: 22 mmol/L (ref 22–32)
Calcium: 8.8 mg/dL — ABNORMAL LOW (ref 8.9–10.3)
Chloride: 101 mmol/L (ref 101–111)
Creatinine, Ser: 0.92 mg/dL (ref 0.44–1.00)
GLUCOSE: 79 mg/dL (ref 65–99)
POTASSIUM: 3.6 mmol/L (ref 3.5–5.1)
SODIUM: 133 mmol/L — AB (ref 135–145)
TOTAL PROTEIN: 7.6 g/dL (ref 6.5–8.1)

## 2015-05-24 LAB — CBC
HEMATOCRIT: 41.4 % (ref 36.0–46.0)
HEMOGLOBIN: 13.4 g/dL (ref 12.0–15.0)
MCH: 27.2 pg (ref 26.0–34.0)
MCHC: 32.4 g/dL (ref 30.0–36.0)
MCV: 84 fL (ref 78.0–100.0)
Platelets: 376 10*3/uL (ref 150–400)
RBC: 4.93 MIL/uL (ref 3.87–5.11)
RDW: 14 % (ref 11.5–15.5)
WBC: 5.1 10*3/uL (ref 4.0–10.5)

## 2015-05-24 LAB — LIPID PANEL
Cholesterol: 203 mg/dL — ABNORMAL HIGH (ref 0–200)
HDL: 61 mg/dL (ref >40)
LDL Cholesterol: 120 mg/dL — ABNORMAL HIGH (ref 0–99)
TRIGLYCERIDES: 112 mg/dL (ref <150)
Total CHOL/HDL Ratio: 3.3 RATIO
VLDL: 22 mg/dL (ref 0–40)

## 2015-05-24 LAB — HEPATIC FUNCTION PANEL
ALK PHOS: 78 U/L (ref 38–126)
ALT: 17 U/L (ref 14–54)
AST: 22 U/L (ref 15–41)
Albumin: 3.7 g/dL (ref 3.5–5.0)
Bilirubin, Direct: <0.1 mg/dL — ABNORMAL LOW (ref 0.1–0.5)
Total Bilirubin: 0.4 mg/dL (ref 0.3–1.2)
Total Protein: 7.2 g/dL (ref 6.5–8.1)

## 2015-05-24 LAB — ETHANOL: Alcohol, Ethyl (B): <5 mg/dL (ref <5)

## 2015-05-24 MED ORDER — BACITRACIN-NEOMYCIN-POLYMYXIN OINTMENT TUBE
TOPICAL_OINTMENT | Freq: Two times a day (BID) | CUTANEOUS | Status: DC
  Administered 2015-05-25 – 2015-05-28 (×4): via TOPICAL
  Administered 2015-05-30: 1 via TOPICAL
  Filled 2015-05-24: qty 15

## 2015-05-24 MED ORDER — TRAZODONE HCL 150 MG PO TABS
150.0000 mg | ORAL_TABLET | Freq: Every evening | ORAL | Status: DC | PRN
  Administered 2015-05-24 – 2015-05-25 (×2): 150 mg via ORAL
  Filled 2015-05-24 (×8): qty 1

## 2015-05-24 MED ORDER — FLUOXETINE HCL 20 MG PO CAPS
20.0000 mg | ORAL_CAPSULE | Freq: Every day | ORAL | Status: DC
  Administered 2015-05-25 – 2015-05-27 (×3): 20 mg via ORAL
  Filled 2015-05-24 (×5): qty 1

## 2015-05-24 MED ORDER — GABAPENTIN 300 MG PO CAPS
300.0000 mg | ORAL_CAPSULE | Freq: Four times a day (QID) | ORAL | Status: DC
  Administered 2015-05-24 – 2015-05-26 (×10): 300 mg via ORAL
  Filled 2015-05-24 (×16): qty 1

## 2015-05-24 NOTE — Progress Notes (Signed)
Patient ID: Beth [name], female   DOB: November 22, 1980, 34 y.o.   MRN: [id number] Giving me a" heads up" prior to eating dinner, that she would be taking her Imitrix after dinner because the lights and the noise and activity of visitation can cause her a headache. Has not complained of headache since this am when took am Imitrix which is BID prn. Also requested her prn Ativan. Consulted NP A. Nwoko to confirm she could have her Imitrix prophalacticly, when she was confronted by NP she states she is getting a headache. Continues to play cards and color in dayroom with peers and visit with guests. She states her headache is now gone.

## 2015-05-24 NOTE — [facility] Group Notes (Signed)
[facility] Group Notes:  (Nursing/MHT/Case Management/Adjunct)  Date:  05/24/2015  Time:  3:06 PM  Type of Therapy:  Nurse Education  Participation Level:  Active  Participation Quality:  Inattentive  Affect:  Appropriate  Cognitive:  Alert  Insight:  Lacking  Engagement in Group:  Off Topic  Modes of Intervention:  Discussion and Education  Summary of Progress/Problems:attended but say at table with three others coloring and having side conversations throughout group  Wynona Luna 05/24/2015, 3:06 PM

## 2015-05-24 NOTE — Progress Notes (Signed)
Recreation Therapy Notes  Animal-Assisted Activity (AAA) Program Checklist/Progress Notes Patient Eligibility Criteria Checklist & Daily Group note for Rec Tx Intervention  Date: 07.26.16 Time: 2:45 pm Location: [address] Dayroom  AAA/T Program Assumption of Risk Form signed by Patient/ or Parent Legal Guardian yes  Patient is free of allergies or sever asthma yes  Patient reports no fear of animals yes  Patient reports no history of cruelty to animalsyes  Patient understands his/her participation is voluntary yes  Patient washes hands before animal contact yes  Patient washes hands after animal contact yes  Education: Hand Washing, Appropriate Animal Interaction   Education Outcome: Acknowledges understanding/In group clarification offered/Needs additional education.   Clinical Observations/Feedback:  Patient did not attend group.   Abdulloh Ullom, LRT/CTRS         Asaf Elmquist A 05/24/2015 4:12 PM 

## 2015-05-24 NOTE — Progress Notes (Signed)
[facility] MD Progress Note  05/24/2015 6:14 PM Beth [name]  MRN:  [id number] Subjective:  Beth [name] states she is experiencing a lot of anxiety, agitation, she is not sleeping well. Does not feel these medications are working for her. Continues to state that if we just  "find the right combination" what we have in place last time she was here worked for her but they also quit working Principal Problem: <principal problem not specified> Diagnosis:   Patient Active Problem List   Diagnosis Date Noted  . 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 Polysubstance dependence [F19.20]     Priority: High  . Alcohol use disorder, moderate, dependence [F10.20] 01/13/2015    Priority: High  . Major depressive disorder, recurrent episode [F33.9] 05/23/2015  . Suicidal ideations [R45.851]   . Substance induced mood disorder [F19.94]   . Substance abuse [F19.10] 03/11/2015  . 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 Free intraperitoneal air [K66.8] 08/18/2014  . Perforated ulcer [K27.5] 08/18/2014  . 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 Acid reflux [K21.9]   . History of blood transfusion [Z92.89]   . Multiple sclerosis [G35] 03/13/2012  . H/O gastric bypass [Z98.84] 03/13/2012   Total Time spent with patient: 30 minutes   Past Medical History:  Past Medical History  Diagnosis Date  . Acid reflux   . Asthma   . History of blood transfusion   . Multiple sclerosis 2005    diagnosed in [place], failed steroids, no other medication  . Anxiety   . Gastric ulcer   . Migraine   . Chronic nausea     self reported  . Chronic diarrhea     self reported  . Chronic pain     self reported chronic pain, back pain, fibromyalgia  . Insomnia   . Polysubstance abuse   . ETOH abuse   . Delirium tremens   . Depression     Past Surgical  History  Procedure Laterality Date  . Gastric bypass    . Diagnostic laparoscopy  2003    repair of perforation at [place] anastamosis   . Lumbar laminectomy/decompression microdiscectomy Right 04/14/2014    Procedure: LUMBAR LAMINECTOMY/DECOMPRESSION MICRODISCECTOMY LEVEL L4-5;  Surgeon: Erline Levine, MD;  Location: [place] [facility];  Service: Neurosurgery;  Laterality: Right;  LUMBAR LAMINECTOMY/DECOMPRESSION MICRODISCECTOMY LEVEL L4-5  . Laparotomy N/A 08/18/2014    Procedure: EXPLORATORY LAPAROTOMY, Patch graft of gastric perforation;  Surgeon: Coralie Keens, MD;  Location: [place] [facility];  Service: General;  Laterality: N/A;   Family History:  Family History  Problem Relation Age of Onset  . Hypertension Paternal Grandfather   . Colon cancer Paternal Grandfather 68  . Asthma Paternal Grandmother   . Hypertension Paternal Grandmother   . Breast cancer Paternal Grandmother   . Cancer Paternal Grandmother     lung   . Diabetes Maternal Grandmother   . Diabetes Maternal Grandfather   . Hypertension Father   . Alcohol abuse Father   . Urinary tract infection Mother   . Stroke Mother 65    3 strokes    Social History:  History  Alcohol Use  . 195.6 oz/week  . 25 Cans of beer, 301 Shots of liquor per week    Comment: reports shes a "light drinker" now- drinks 1 pt of vodka and 2 24 oz beers     History  Drug Use  . Yes  . Special: Marijuana, Cocaine, Methamphetamines, Oxycodone, Heroin    Comment: cocaine-none >1 year.  meth-none >10 years.    History   Social History  . Marital Status: Single    Spouse Name: N/A  . Number of Children: N/A  . Years of Education: N/A   Social History Main Topics  . Smoking status: Current Some Day Smoker -- 0.50 packs/day    Types: Cigarettes  . Smokeless tobacco: Never Used  . Alcohol Use: 195.6 oz/week    25 Cans of beer, 301 Shots of liquor per week     Comment: reports shes a "light drinker" now- drinks 1 pt of vodka and 2 24 oz beers  . Drug  Use: Yes    Special: Marijuana, Cocaine, Methamphetamines, Oxycodone, Heroin     Comment: cocaine-none >1 year.  meth-none >10 years.  . Sexual Activity: Yes    Birth Control/ Protection: None   Other Topics Concern  . None   Social History Narrative   Additional History:    Sleep: Poor  Appetite:  Poor   Assessment:   Musculoskeletal: Strength & Muscle Tone: within normal limits Gait & Station: normal Patient leans: normal   Psychiatric Specialty Exam: Physical Exam  Review of Systems  Constitutional: Positive for malaise/fatigue.  HENT: Negative.   Eyes: Negative.   Respiratory: Negative.   Cardiovascular: Negative.   Gastrointestinal: Negative.   Genitourinary: Negative.   Musculoskeletal: Negative.   Skin: Negative.   Neurological: Positive for weakness.  Endo/Heme/Allergies: Negative.   Psychiatric/Behavioral: Positive for depression. The patient is nervous/anxious and has insomnia.     Blood pressure 94/67, pulse 83, temperature 98 F (36.7 C), temperature source Oral, resp. rate 18, height 5' 5.5" (1.664 m), weight 117.028 kg (258 lb).Body mass index is 42.27 kg/(m^2).  General Appearance: Fairly Groomed  Engineer, water::  Fair  Speech:  Clear and Coherent  Volume:  fluctuates  Mood:  Anxious, Depressed and Dysphoric  Affect:  anxious depressed worried  Thought Process:  Coherent and Goal Directed  Orientation:  Full (Time, Place, and Person)  Thought Content:  symptoms events worries concerns  Suicidal Thoughts:  No  Homicidal Thoughts:  No  Memory:  Immediate;   Fair Recent;   Fair Remote;   Fair  Judgement:  Fair  Insight:  Present and Shallow  Psychomotor Activity:  Restlessness  Concentration:  Fair  Recall:  [employer] of Knowledge:Fair  Language: Fair  Akathisia:  No  Handed:  Right  AIMS (if indicated):     Assets:  Desire for Improvement Housing  ADL's:  Intact  Cognition: WNL  Sleep:  Number of Hours: 5.25     Current  Medications: Current Facility-Administered Medications  Medication Dose Route Frequency Provider Last Rate Last Dose  . acetaminophen (TYLENOL) tablet 650 mg  650 mg Oral Q6H PRN Encarnacion Slates, NP      . alum & mag hydroxide-simeth (MAALOX/MYLANTA) 200-200-20 MG/5ML suspension 30 mL  30 mL Oral Q4H PRN Encarnacion Slates, NP      . bacitracin ointment   Topical BID Encarnacion Slates, NP      . Derrill Memo ON 05/25/2015] FLUoxetine (PROZAC) capsule 20 mg  20 mg Oral Daily Nicholaus Bloom, MD      . gabapentin (NEURONTIN) capsule 300 mg  300 mg Oral QID Nicholaus Bloom, MD   300 mg at 05/24/15 1651  . ketorolac (TORADOL) tablet 10 mg  10 mg Oral  Q6H PRN Nicholaus Bloom, MD   10 mg at 05/24/15 1528  . lamoTRIgine (LAMICTAL) tablet 200 mg  200 mg Oral Daily Kathlee Nations, MD   200 mg at 05/24/15 0823  . LORazepam (ATIVAN) tablet 0.5 mg  0.5 mg Oral TID PRN Kathlee Nations, MD   0.5 mg at 05/24/15 0825  . magnesium hydroxide (MILK OF MAGNESIA) suspension 30 mL  30 mL Oral Daily PRN Encarnacion Slates, NP      . methocarbamol (ROBAXIN) tablet 500 mg  500 mg Oral QID Nicholaus Bloom, MD   500 mg at 05/24/15 1651  . neomycin-bacitracin-polymyxin (NEOSPORIN) ointment   Topical BID Ursula Alert, MD      . nicotine polacrilex (NICORETTE) gum 2 mg  2 mg Oral PRN Nicholaus Bloom, MD   2 mg at 05/24/15 0829  . pantoprazole (PROTONIX) EC tablet 20 mg  20 mg Oral Daily Nicholaus Bloom, MD   20 mg at 05/24/15 8937  . prazosin (MINIPRESS) capsule 1 mg  1 mg Oral QHS Kathlee Nations, MD   1 mg at 05/23/15 2308  . SUMAtriptan (IMITREX) tablet 50 mg  50 mg Oral BID PRN Nicholaus Bloom, MD   50 mg at 05/24/15 0826  . traZODone (DESYREL) tablet 150 mg  150 mg Oral QHS PRN Nicholaus Bloom, MD   150 mg at 05/23/15 2308    Lab Results:  Results for orders placed or performed during the hospital encounter of 05/23/15 (from the past 48 hour(s))  Urinalysis, Routine w reflex microscopic (not at [facility])     Status: Abnormal   Collection Time: 05/23/15  7:25 PM   Result Value Ref Range   Color, Urine YELLOW YELLOW   APPearance CLOUDY (A) CLEAR   Specific Gravity, Urine 1.018 1.005 - 1.030   pH 5.5 5.0 - 8.0   Glucose, UA NEGATIVE NEGATIVE mg/dL   Hgb urine dipstick MODERATE (A) NEGATIVE   Bilirubin Urine NEGATIVE NEGATIVE   Ketones, ur NEGATIVE NEGATIVE mg/dL   Protein, ur NEGATIVE NEGATIVE mg/dL   Urobilinogen, UA 1.0 0.0 - 1.0 mg/dL   Nitrite NEGATIVE NEGATIVE   Leukocytes, UA NEGATIVE NEGATIVE    Comment: Performed at [facility]  Urine rapid drug screen (hosp performed)not at [facility]     Status: Abnormal   Collection Time: 05/23/15  7:25 PM  Result Value Ref Range   Opiates POSITIVE (A) NONE DETECTED   Cocaine NONE DETECTED NONE DETECTED   Benzodiazepines POSITIVE (A) NONE DETECTED   Amphetamines NONE DETECTED NONE DETECTED   Tetrahydrocannabinol NONE DETECTED NONE DETECTED   Barbiturates NONE DETECTED NONE DETECTED    Comment:        DRUG SCREEN FOR MEDICAL PURPOSES ONLY.  IF CONFIRMATION IS NEEDED FOR ANY PURPOSE, NOTIFY LAB WITHIN 5 DAYS.        LOWEST DETECTABLE LIMITS FOR URINE DRUG SCREEN Drug Class       Cutoff (ng/mL) Amphetamine      1000 Barbiturate      200 Benzodiazepine   342 Tricyclics       876 Opiates          300 Cocaine          300 THC              50 Performed at [facility]   Urine microscopic-add on     Status: None   Collection Time: 05/23/15  7:25 PM  Result Value Ref Range   Squamous Epithelial / LPF RARE RARE   Urine-Other MUCOUS PRESENT     Comment: Performed at [facility]  CBC     Status: None   Collection Time: 05/24/15  6:21 AM  Result Value Ref Range   WBC 5.1 4.0 - 10.5 K/uL   RBC 4.93 3.87 - 5.11 MIL/uL   Hemoglobin 13.4 12.0 - 15.0 g/dL   HCT 41.4 36.0 - 46.0 %   MCV 84.0 78.0 - 100.0 fL   MCH 27.2 26.0 - 34.0 pg   MCHC 32.4 30.0 - 36.0 g/dL   RDW 14.0 11.5 - 15.5 %   Platelets 376 150 - 400 K/uL    Comment: Performed at  [facility]  Comprehensive metabolic panel     Status: Abnormal   Collection Time: 05/24/15  6:21 AM  Result Value Ref Range   Sodium 133 (L) 135 - 145 mmol/L   Potassium 3.6 3.5 - 5.1 mmol/L   Chloride 101 101 - 111 mmol/L   CO2 22 22 - 32 mmol/L   Glucose, Bld 79 65 - 99 mg/dL   BUN 10 6 - 20 mg/dL   Creatinine, Ser 0.92 0.44 - 1.00 mg/dL   Calcium 8.8 (L) 8.9 - 10.3 mg/dL   Total Protein 7.6 6.5 - 8.1 g/dL   Albumin 3.7 3.5 - 5.0 g/dL   AST 22 15 - 41 U/L   ALT 17 14 - 54 U/L   Alkaline Phosphatase 78 38 - 126 U/L   Total Bilirubin 0.3 0.3 - 1.2 mg/dL   GFR calc non Af Amer >60 >60 mL/min   GFR calc Af Amer >60 >60 mL/min    Comment: (NOTE) The eGFR has been calculated using the CKD EPI equation. This calculation has not been validated in all clinical situations. eGFR's persistently <60 mL/min signify possible Chronic Kidney Disease.    Anion gap 10 5 - 15    Comment: Performed at [facility]  Ethanol     Status: None   Collection Time: 05/24/15  6:21 AM  Result Value Ref Range   Alcohol, Ethyl (B) <5 <5 mg/dL    Comment:        LOWEST DETECTABLE LIMIT FOR SERUM ALCOHOL IS 5 mg/dL FOR MEDICAL PURPOSES ONLY Performed at [facility]   Lipid panel, fasting     Status: Abnormal   Collection Time: 05/24/15  6:21 AM  Result Value Ref Range   Cholesterol 203 (H) 0 - 200 mg/dL   Triglycerides 112 <150 mg/dL   HDL 61 >40 mg/dL   Total CHOL/HDL Ratio 3.3 RATIO   VLDL 22 0 - 40 mg/dL   LDL Cholesterol 120 (H) 0 - 99 mg/dL    Comment:        Total Cholesterol/HDL:CHD Risk Coronary Heart Disease Risk Table                     Men   Women  1/2 Average Risk   3.4   3.3  Average Risk       5.0   4.4  2 X Average Risk   9.6   7.1  3 X Average Risk  23.4   11.0        Use the calculated Patient Ratio above and the CHD Risk Table to determine the patient's CHD Risk.        ATP III CLASSIFICATION (LDL):  <100  mg/dL   Optimal  100-129  mg/dL   Near or Above                    Optimal  130-159  mg/dL   Borderline  160-189  mg/dL   High  >190     mg/dL   Very High Performed at [facility]   Hepatic function panel     Status: Abnormal   Collection Time: 05/24/15  6:21 AM  Result Value Ref Range   Total Protein 7.2 6.5 - 8.1 g/dL   Albumin 3.7 3.5 - 5.0 g/dL   AST 22 15 - 41 U/L   ALT 17 14 - 54 U/L   Alkaline Phosphatase 78 38 - 126 U/L   Total Bilirubin 0.4 0.3 - 1.2 mg/dL   Bilirubin, Direct <0.1 (L) 0.1 - 0.5 mg/dL   Indirect Bilirubin NOT CALCULATED 0.3 - 0.9 mg/dL    Comment: Performed at [facility]    Physical Findings: AIMS: Facial and Oral Movements Muscles of Facial Expression: None, normal Lips and Perioral Area: None, normal Jaw: None, normal Tongue: None, normal,Extremity Movements Upper (arms, wrists, hands, fingers): None, normal Lower (legs, knees, ankles, toes): None, normal, Trunk Movements Neck, shoulders, hips: None, normal, Overall Severity Severity of abnormal movements (highest score from questions above): None, normal Incapacitation due to abnormal movements: None, normal Patient's awareness of abnormal movements (rate only patient's report): No Awareness, Dental Status Current problems with teeth and/or dentures?: No Does patient usually wear dentures?: No  CIWA:  CIWA-Ar Total: 1 COWS:     Treatment Plan Summary: Daily contact with patient to assess and evaluate symptoms and progress in treatment and Medication management Supportive approach/coping skills Mood instability; will go ahead and decrease the Neurontin, D/C the Seroquel and start from scratch Will get an EKG, will consider Geodon if the EKG is OK Will also work with CBT/mindfulness and other strategies to help deal with the anxiety and not to completely depend on a pill to take care of it Will also be mindful of the possible activation effect from the  Prozac Insomnia; will have to reassess the bedtime medications Medical Decision Making:  Review of Psycho-Social Stressors (1), Review or order clinical lab tests (1), Review of Medication Regimen & Side Effects (2) and Review of New Medication or Change in Dosage (2)     Navaya Wiatrek A 05/24/2015, 6:14 PM

## 2015-05-24 NOTE — Tx Team (Signed)
Interdisciplinary Treatment Plan Update (Adult) Date: 05/24/2015    Time Reviewed: 9:30 AM  Progress in Treatment: Attending groups: Continuing to assess, patient new to milieu Participating in groups: Continuing to assess, patient new to milieu Taking medication as prescribed: Yes Tolerating medication: Yes Family/Significant other contact made: No, CSW assessing for appropriate contacts Patient understands diagnosis: Yes Discussing patient identified problems/goals with staff: Yes Medical problems stabilized or resolved: Yes Denies suicidal/homicidal ideation: Yes Issues/concerns per patient self-inventory: Yes Other:  New problem(s) identified: N/A  Discharge Plan or Barriers: 05/24/2015:  CSW continuing to assess, patient new to milieu.  Reason for Continuation of Hospitalization:  Depression Anxiety Medication Stabilization   Comments: N/A  Estimated length of stay: 3-5 days   Beth [name] is a 34yo female who has been hospitalized multiple times in the past year. She started out in the OBS unit last July for detox, returned to inpt for detox in March of 16. She went to [facility] for 28 days directly, then was in their IOP and going to AA/had a sponsor for 2-1/2 weeks. She was diagnosed with Borderline Personality Disorder. She was going to as many as 8 AA meetings daily in addition to IOP 4 nights a week.After relapsing, she returned here for detox on 03/11/15. Stabilized and went to IOP briefly, but needed oral surgery and dropped out of group. Relapsed. Returned to ED on 6/22 and was sent to [facility] for detox and treatment. Returned home and came to an outpt appointment for medication management in our clinic, where she was referred back to inpatient for the following reasons. Her sister committed suicide in July of 2013, right before her birthday and this time of year is always especially painful.She reports constant anxiety and self medication with Alcohol and  Heroin. She endorses passive SI with no plan or intent, spontaneous bouts of anger and irritability with some violence where she reports she disassociates. She also endorses visual and auditory hallucinations. The patient would benefit from safety monitoring, medication evaluation, psychoeducation, group therapy, and discharge planning to link with ongoing resources. The patient refused referral to [facility] for smoking cessation.    Review of initial/current patient goals per problem list:  1. Goal(s): Patient will participate in aftercare plan   Met: No   Target date: 3-5 days post admission   As evidenced by: Patient will participate within aftercare plan AEB aftercare provider and housing plan at discharge being identified.  7/26: Goal not met: CSW assessing for appropriate referrals for pt and will have follow up secured prior to d/c.   2. Goal (s): Patient will exhibit decreased depressive symptoms and suicidal ideations.   Met: No   Target date: 3-5 days post admission   As evidenced by: Patient will utilize self rating of depression at 3 or below and demonstrate decreased signs of depression or be deemed stable for discharge by MD.  7/26: Goal not met: Pt presents with flat affect and depressed mood.  Pt admitted with depression rating of 10.  Pt to show decreased sign of depression and a rating of 3 or less before d/c.     3. Goal(s): Patient will demonstrate decreased signs and symptoms of anxiety.   Met: No   Target date: 3-5 days post admission   As evidenced by: Patient will utilize self rating of anxiety at 3 or below and demonstrated decreased signs of anxiety, or be deemed stable for discharge by MD  7/26: Goal not met: Pt presents with anxious mood  and affect.  Pt admitted with anxiety rating of 10.  Pt to show decreased sign of anxiety and a rating of 3 or less before d/c.    4. Goal(s): Patient will demonstrate decreased signs of withdrawal due to  substance abuse   Met: Yes   Target date: 3-5 days post admission   As evidenced by: Patient will produce a CIWA/COWS score of 0, have stable vitals signs, and no symptoms of withdrawal  7/26: Goal met: No withdrawal symptoms reported at this time per medical chart.    Attendees: Patient:    Family:    Physician: Dr. Parke Poisson; Dr. Sabra Heck 05/24/2015 9:30 AM  Nursing: Kerby Nora RN 05/24/2015 9:30 AM  Clinical Social Worker: Erasmo Downer Agness Sibrian,  [place] 05/24/2015 9:30 AM  Other: Peri Maris, LCSWA 05/24/2015 9:30 AM  Other: Lucinda Dell, [place] Sessions Liaison 05/24/2015 9:30 AM  Other:  Lars Pinks, RN CM 05/24/2015 9:30 AM  Other: Ave Filter, NP 05/24/2015 9:30 AM  Other:       Scribe for Treatment Team:  Tilden Fossa, MSW, [place] [phone]

## 2015-05-24 NOTE — Progress Notes (Signed)
Patient ID: Beth [name], female   DOB: [birth year], 34 y.o.   MRN: [id number] EKG completed as ordered. Cooperative with process.

## 2015-05-24 NOTE — Progress Notes (Signed)
Pt attended the evening AA speaker meeting. 

## 2015-05-24 NOTE — Progress Notes (Signed)
D: Pt denies SI/HI/AVH. Pt is pleasant and cooperative. Pt very pessimistic about everything. Pt has limited insight into her Tx, but seems pt may be willing to try .   A: Pt was offered support and encouragement. Pt was given scheduled medications. Pt was encourage to attend groups. Q 15 minute checks were done for safety.   R:Pt attends groups and interacts well with peers and staff. Pt is taking medication. Pt has no complaints at this time .Pt receptive to treatment and safety maintained on unit.

## 2015-05-24 NOTE — [facility] Group Notes (Signed)
[facility] LCSW Group Therapy 05/24/2015  1:15 PM   Type of Therapy: Group Therapy  Participation Level: Did Not Attend. Patient invited to participate but declined.   Judson Tsan, MSW, LCSWA Clinical Social Worker [facility] [phone]   

## 2015-05-24 NOTE — Progress Notes (Signed)
Patient ID: Beth [name], female   DOB: [birth year], 34 y.o.   MRN: [id number] D-Self inventory completed and scored self a 8-9 on feelings of anxiety, and a 7-8 on feelings of depression. She states she needs to get on the correct medication and have it regulated and she is anxious to get home. She states she is "feeling frustrated and short tempered" A-Support offered Monitored for safety and medications as ordered.  R-Complains of pain in back. She is animated and playing cards with peers, but to writer states how bad she is feeling.She is taking Robaxin for back pain and got Imitrix this am for complaint of a headache.

## 2015-05-25 ENCOUNTER — Other Ambulatory Visit (HOSPITAL_COMMUNITY): Payer: BLUE CROSS/BLUE SHIELD

## 2015-05-25 LAB — HEMOGLOBIN A1C
HEMOGLOBIN A1C: 5.2 % (ref 4.8–5.6)
Mean Plasma Glucose: 103 mg/dL

## 2015-05-25 NOTE — Progress Notes (Signed)
Pt attended the evening NA speaker meeting. 

## 2015-05-25 NOTE — [facility] Group Notes (Signed)
[facility] LCSW Group Therapy 05/25/2015  1:15 PM Type of Therapy: Group Therapy Participation Level: Minimal  Participation Quality: Minimal  Affect: Appropriate  Cognitive: Alert and Oriented  Insight: Developing/Improving and Engaged  Engagement in Therapy: Developing/Improving and Engaged  Modes of Intervention: Clarification, Confrontation, Discussion, Education, Exploration, Limit-setting, Orientation, Problem-solving, Rapport Building, Dance movement psychotherapist, Socialization and Support  Summary of Progress/Problems: The topic for group today was emotional regulation. This group focused on both positive and negative emotion identification and allowed group members to process ways to identify feelings, regulate negative emotions, and find healthy ways to manage internal/external emotions. Group members were asked to reflect on a time when their reaction to an emotion led to a negative outcome and explored how alternative responses using emotion regulation would have benefited them. Group members were also asked to discuss a time when emotion regulation was utilized when a negative emotion was experienced. Patient came in and out of group room during assessment and participated minimally in discussion. She did state that running and spending time with her mother is helpful in managing difficult emotions.  Samuella Bruin, MSW, [employer] Clinical Social Worker [facility] [phone]

## 2015-05-25 NOTE — [facility] Suicide Risk Assessment (Signed)
[facility] INPATIENT:  Family/Significant Other Suicide Prevention Education  Suicide Prevention Education:  Education Completed; Mellody Drown, mother, [phone]  (name of family member/significant other) has been identified by the patient as the family member/significant other with whom the patient will be residing, and identified as the person(s) who will aid the patient in the event of a mental health crisis (suicidal ideations/suicide attempt).  With written consent from the patient, the family member/significant other has been provided the following suicide prevention education, prior to the and/or following the discharge of the patient.  The suicide prevention education provided includes the following:  Suicide risk factors  Suicide prevention and interventions  National Suicide Hotline telephone number  [facility] assessment telephone number  [facility] Emergency Assistance 911  [facility] and/or Residential Mobile Crisis Unit telephone number  Request made of family/significant other to:  Remove weapons (e.g., guns, rifles, knives), all items previously/currently identified as safety concern.    Remove drugs/medications (over-the-counter, prescriptions, illicit drugs), all items previously/currently identified as a safety concern.  The family member/significant other verbalizes understanding of the suicide prevention education information provided.  The family member/significant other agrees to remove the items of safety concern listed above.  CSW reviewed SPE w mother, mother stated she was very familiar w risk factors, warning signs and what to do re suicide - had gotten information during past hospitalizations.  Supportive of patient and expressed no concerns.  Santa Genera, LCSW Clinical Social Worker     Sallee Lange 05/25/2015, 2:53 PM

## 2015-05-25 NOTE — Progress Notes (Signed)
Recreation Therapy Notes  Date: 07.27.16 Time: 9:30 am Location: [address] Group Room  Group Topic: Stress Management  Goal Area(s) Addresses:  Patient will verbalize importance of using healthy stress management.  Patient will identify positive emotions associated with healthy stress management.   Intervention: Stress Management  Activity :  Guided Training and development officer.  LRT introduced the technique of guided imagery.  A script was used to deliver the technique to the patients.  Patients were asked to follow the script read a loud by LRT to engage in the technique of guided imagery.  Education:  Stress Management, Discharge Planning.   Education Outcome: Acknowledges edcuation/In group clarification offered/Needs additional education  Clinical Observations/Feedback: Patient did not attend group.   Caroll Rancher, LRT/CTRS         Caroll Rancher A 05/25/2015 12:20 PM

## 2015-05-25 NOTE — Clinical Social Work Note (Signed)
CSW spoke w mother, feels that MS makes patient's thoughts "fuzzy" at times - has 7 lesions on her brain per pre surgery MRI last year, has had increasing difficulty w urinary tract, bed wetting, increased difficulty w walking, difficulty remembering "short term things", inattention, losing train of thought.  Seems very difficult for patient to remain focussed, easily distracted and overwhelmed.  Patient can drive, but "on days when legs are jerking, she will hand keys to mother."  Has lost privileges of car per mother due to drug use.  Considering filing for disability.  Patient has been looking for job, had several interviews, but no stable job.  Parents want to encourage patient's independence, but are appropriately supportive.  States she is familiar w suicide prevention education, does not feel this is a risk for patient at present or at discharge.  Santa Genera, LCSW Clinical Social Worker

## 2015-05-25 NOTE — [facility] Group Notes (Signed)
   [facility] LCSW Aftercare Discharge Planning Group Note  05/25/2015  8:45 AM   Participation Quality: Alert, Appropriate and Oriented  Mood/Affect: Appropriate  Depression Rating: 3  Anxiety Rating: 7  Thoughts of Suicide: Pt denies SI/HI  Will you contract for safety? Yes  Current AVH: Pt denies  Plan for Discharge/Comments: Pt attended discharge planning group and actively participated in group. CSW provided pt with today's workbook. Patient plans to return home to follow up with Dr. Lolly Mustache.   Transportation Means: Pt reports access to transportation  Supports: No supports mentioned at this time  Samuella Bruin, MSW, [employer] Clinical Social Worker [employer] [phone]

## 2015-05-25 NOTE — Progress Notes (Signed)
D: Pt denies SI/HI/AV. Pt is pleasant and cooperative. Pt rates depression at a 3, anxiety at a 7, and Helplessness/hopelessness at a 0.  A: Pt was offered support and encouragement. Pt was given scheduled medications. Pt was encourage to attend groups. Q 15 minute checks were done for safety.  R:Pt attends groups and interacts well with peers and staff. Pt taking medication. Pt has no complaints.Pt receptive to treatment and safety maintained on unit.

## 2015-05-25 NOTE — Plan of Care (Signed)
Problem: Ineffective individual coping Goal: LTG: Patient will report a decrease in negative feelings Outcome: Progressing Pt stated she was feeling happier today  Problem: Alteration in mood Goal: LTG-Patient reports reduction in suicidal thoughts (Patient reports reduction in suicidal thoughts and is able to verbalize a safety plan for whenever patient is feeling suicidal)  Outcome: Progressing Pt denies SI at this time

## 2015-05-25 NOTE — Progress Notes (Signed)
[facility] MD Progress Note  05/25/2015 6:20 PM Beth [name]  MRN:  [id number] Subjective:  Ladon's QT interval was prolonged. Seroquel had been D/C. She states she slept some last night with out the Seroquel. She states she wants to feel better. She is not sure which symptoms are coming from her neurological condition and which are coming from psych. She does endorse a lot of anxiety.  Principal Problem: <principal problem not specified> Diagnosis:   Patient Active Problem List   Diagnosis Date Noted  . 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 Polysubstance dependence [F19.20]     Priority: High  . Alcohol use disorder, moderate, dependence [F10.20] 01/13/2015    Priority: High  . Major depressive disorder, recurrent episode [F33.9] 05/23/2015  . Suicidal ideations [R45.851]   . Substance induced mood disorder [F19.94]   . Substance abuse [F19.10] 03/11/2015  . 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 Free intraperitoneal air [K66.8] 08/18/2014  . Perforated ulcer [K27.5] 08/18/2014  . 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 Acid reflux [K21.9]   . History of blood transfusion [Z92.89]   . Multiple sclerosis [G35] 03/13/2012  . H/O gastric bypass [Z98.84] 03/13/2012   Total Time spent with patient: 30 minutes   Past Medical History:  Past Medical History  Diagnosis Date  . Acid reflux   . Asthma   . History of blood transfusion   . Multiple sclerosis 2005    diagnosed in [place], failed steroids, no other medication  . Anxiety   . Gastric ulcer   . Migraine   . Chronic nausea     self reported  . Chronic diarrhea     self reported  . Chronic pain     self reported chronic pain, back pain, fibromyalgia  . Insomnia   . Polysubstance abuse   . ETOH abuse   . Delirium tremens   . Depression     Past Surgical  History  Procedure Laterality Date  . Gastric bypass    . Diagnostic laparoscopy  2003    repair of perforation at [facility] anastamosis   . Lumbar laminectomy/decompression microdiscectomy Right 04/14/2014    Procedure: LUMBAR LAMINECTOMY/DECOMPRESSION MICRODISCECTOMY LEVEL L4-5;  Surgeon: Erline Levine, MD;  Location: [facility];  Service: Neurosurgery;  Laterality: Right;  LUMBAR LAMINECTOMY/DECOMPRESSION MICRODISCECTOMY LEVEL L4-5  . Laparotomy N/A 08/18/2014    Procedure: EXPLORATORY LAPAROTOMY, Patch graft of gastric perforation;  Surgeon: Coralie Keens, MD;  Location: [facility];  Service: General;  Laterality: N/A;   Family History:  Family History  Problem Relation Age of Onset  . Hypertension Paternal Grandfather   . Colon cancer Paternal Grandfather 6  . Asthma Paternal Grandmother   . Hypertension Paternal Grandmother   . Breast cancer Paternal Grandmother   . Cancer Paternal Grandmother     lung   . Diabetes Maternal Grandmother   . Diabetes Maternal Grandfather   . Hypertension Father   . Alcohol abuse Father   . Urinary tract infection Mother   . Stroke Mother 2    3 strokes    Social History:  History  Alcohol Use  . 195.6 oz/week  . 25 Cans of beer, 301 Shots of liquor per week    Comment: reports shes a "light drinker" now- drinks 1 pt of vodka and 2 24 oz beers     History  Drug  Use  . Yes  . Special: Marijuana, Cocaine, Methamphetamines, Oxycodone, Heroin    Comment: cocaine-none >1 year.  meth-none >10 years.    History   Social History  . Marital Status: Single    Spouse Name: N/A  . Number of Children: N/A  . Years of Education: N/A   Social History Main Topics  . Smoking status: Current Some Day Smoker -- 0.50 packs/day    Types: Cigarettes  . Smokeless tobacco: Never Used  . Alcohol Use: 195.6 oz/week    25 Cans of beer, 301 Shots of liquor per week     Comment: reports shes a "light drinker" now- drinks 1 pt of vodka and 2 24 oz beers  . Drug  Use: Yes    Special: Marijuana, Cocaine, Methamphetamines, Oxycodone, Heroin     Comment: cocaine-none >1 year.  meth-none >10 years.  . Sexual Activity: Yes    Birth Control/ Protection: None   Other Topics Concern  . None   Social History Narrative   Additional History:    Sleep: Fair  Appetite:  Fair   Assessment:   Musculoskeletal: Strength & Muscle Tone: within normal limits Gait & Station: normal Patient leans: normal   Psychiatric Specialty Exam: Physical Exam  Review of Systems  Constitutional: Negative.   HENT: Negative.   Eyes: Negative.   Respiratory: Negative.   Cardiovascular: Negative.   Gastrointestinal: Negative.   Genitourinary: Negative.   Musculoskeletal: Positive for back pain.  Skin: Negative.   Neurological: Negative.   Endo/Heme/Allergies: Negative.   Psychiatric/Behavioral: Positive for depression. The patient is nervous/anxious and has insomnia.     Blood pressure 111/76, pulse 74, temperature 98 F (36.7 C), temperature source Oral, resp. rate 18, height 5' 5.5" (1.664 m), weight 117.028 kg (258 lb).Body mass index is 42.27 kg/(m^2).  General Appearance: Well Groomed  Engineer, water::  Fair  Speech:  Clear and Coherent  Volume:  Normal  Mood:  Anxious  Affect:  Appropriate  Thought Process:  Coherent and Goal Directed  Orientation:  Full (Time, Place, and Person)  Thought Content:  symptoms events worries concerns  Suicidal Thoughts:  No  Homicidal Thoughts:  No  Memory:  Immediate;   Fair Recent;   Fair Remote;   Fair  Judgement:  Fair  Insight:  Present  Psychomotor Activity:  Normal  Concentration:  Fair  Recall:  [employer] [place]  Language: Fair  Akathisia:  No  Handed:  Right  AIMS (if indicated):     Assets:  Desire for Improvement Housing Social Support  ADL's:  Intact  Cognition: Normal  Sleep:  Number of Hours: 5.75     Current Medications: Current Facility-Administered Medications  Medication  Dose Route Frequency Provider Last Rate Last Dose  . acetaminophen (TYLENOL) tablet 650 mg  650 mg Oral Q6H PRN Encarnacion Slates, NP      . alum & mag hydroxide-simeth (MAALOX/MYLANTA) 200-200-20 MG/5ML suspension 30 mL  30 mL Oral Q4H PRN Encarnacion Slates, NP      . bacitracin ointment   Topical BID Encarnacion Slates, NP      . FLUoxetine (PROZAC) capsule 20 mg  20 mg Oral Daily Nicholaus Bloom, MD   20 mg at 05/25/15 0732  . gabapentin (NEURONTIN) capsule 300 mg  300 mg Oral QID Nicholaus Bloom, MD   300 mg at 05/25/15 1722  . ketorolac (TORADOL) tablet 10 mg  10 mg Oral Q6H PRN Nicholaus Bloom, MD  10 mg at 05/25/15 1356  . lamoTRIgine (LAMICTAL) tablet 200 mg  200 mg Oral Daily Kathlee Nations, MD   200 mg at 05/25/15 0732  . LORazepam (ATIVAN) tablet 0.5 mg  0.5 mg Oral TID PRN Kathlee Nations, MD   0.5 mg at 05/25/15 1722  . magnesium hydroxide (MILK OF MAGNESIA) suspension 30 mL  30 mL Oral Daily PRN Encarnacion Slates, NP      . methocarbamol (ROBAXIN) tablet 500 mg  500 mg Oral QID Nicholaus Bloom, MD   500 mg at 05/25/15 1722  . neomycin-bacitracin-polymyxin (NEOSPORIN) ointment   Topical BID Ursula Alert, MD      . nicotine polacrilex (NICORETTE) gum 2 mg  2 mg Oral PRN Nicholaus Bloom, MD   2 mg at 05/25/15 1817  . pantoprazole (PROTONIX) EC tablet 20 mg  20 mg Oral Daily Nicholaus Bloom, MD   20 mg at 05/25/15 0732  . prazosin (MINIPRESS) capsule 1 mg  1 mg Oral QHS Kathlee Nations, MD   1 mg at 05/24/15 2236  . SUMAtriptan (IMITREX) tablet 50 mg  50 mg Oral BID PRN Nicholaus Bloom, MD   50 mg at 05/25/15 1816  . traZODone (DESYREL) tablet 150 mg  150 mg Oral QHS,MR X 1 Laverle Hobby, PA-C   150 mg at 05/24/15 2237    Lab Results:  Results for orders placed or performed during the hospital encounter of 05/23/15 (from the past 48 hour(s))  Urinalysis, Routine w reflex microscopic (not at [facility])     Status: Abnormal   Collection Time: 05/23/15  7:25 PM  Result Value Ref Range   Color, Urine YELLOW YELLOW    APPearance CLOUDY (A) CLEAR   Specific Gravity, Urine 1.018 1.005 - 1.030   pH 5.5 5.0 - 8.0   Glucose, UA NEGATIVE NEGATIVE mg/dL   Hgb urine dipstick MODERATE (A) NEGATIVE   Bilirubin Urine NEGATIVE NEGATIVE   Ketones, ur NEGATIVE NEGATIVE mg/dL   Protein, ur NEGATIVE NEGATIVE mg/dL   Urobilinogen, UA 1.0 0.0 - 1.0 mg/dL   Nitrite NEGATIVE NEGATIVE   Leukocytes, UA NEGATIVE NEGATIVE    Comment: Performed at [facility]  Urine rapid drug screen (hosp performed)not at [facility]     Status: Abnormal   Collection Time: 05/23/15  7:25 PM  Result Value Ref Range   Opiates POSITIVE (A) NONE DETECTED   Cocaine NONE DETECTED NONE DETECTED   Benzodiazepines POSITIVE (A) NONE DETECTED   Amphetamines NONE DETECTED NONE DETECTED   Tetrahydrocannabinol NONE DETECTED NONE DETECTED   Barbiturates NONE DETECTED NONE DETECTED    Comment:        DRUG SCREEN FOR MEDICAL PURPOSES ONLY.  IF CONFIRMATION IS NEEDED FOR ANY PURPOSE, NOTIFY LAB WITHIN 5 DAYS.        LOWEST DETECTABLE LIMITS FOR URINE DRUG SCREEN Drug Class       Cutoff (ng/mL) Amphetamine      1000 Barbiturate      200 Benzodiazepine   706 Tricyclics       237 Opiates          300 Cocaine          300 THC              50 Performed at [facility]   Urine microscopic-add on     Status: None   Collection Time: 05/23/15  7:25 PM  Result Value Ref Range   Squamous  Epithelial / LPF RARE RARE   Urine-Other MUCOUS PRESENT     Comment: Performed at [facility]  CBC     Status: None   Collection Time: 05/24/15  6:21 AM  Result Value Ref Range   WBC 5.1 4.0 - 10.5 K/uL   RBC 4.93 3.87 - 5.11 MIL/uL   Hemoglobin 13.4 12.0 - 15.0 g/dL   HCT 41.4 36.0 - 46.0 %   MCV 84.0 78.0 - 100.0 fL   MCH 27.2 26.0 - 34.0 pg   MCHC 32.4 30.0 - 36.0 g/dL   RDW 14.0 11.5 - 15.5 %   Platelets 376 150 - 400 K/uL    Comment: Performed at [facility]  Comprehensive metabolic  panel     Status: Abnormal   Collection Time: 05/24/15  6:21 AM  Result Value Ref Range   Sodium 133 (L) 135 - 145 mmol/L   Potassium 3.6 3.5 - 5.1 mmol/L   Chloride 101 101 - 111 mmol/L   CO2 22 22 - 32 mmol/L   Glucose, Bld 79 65 - 99 mg/dL   BUN 10 6 - 20 mg/dL   Creatinine, Ser 0.92 0.44 - 1.00 mg/dL   Calcium 8.8 (L) 8.9 - 10.3 mg/dL   Total Protein 7.6 6.5 - 8.1 g/dL   Albumin 3.7 3.5 - 5.0 g/dL   AST 22 15 - 41 U/L   ALT 17 14 - 54 U/L   Alkaline Phosphatase 78 38 - 126 U/L   Total Bilirubin 0.3 0.3 - 1.2 mg/dL   GFR calc non Af Amer >60 >60 mL/min   GFR calc Af Amer >60 >60 mL/min    Comment: (NOTE) The eGFR has been calculated using the CKD EPI equation. This calculation has not been validated in all clinical situations. eGFR's persistently <60 mL/min signify possible Chronic Kidney Disease.    Anion gap 10 5 - 15    Comment: Performed at [facility]  Hemoglobin A1c     Status: None   Collection Time: 05/24/15  6:21 AM  Result Value Ref Range   Hgb A1c MFr Bld 5.2 4.8 - 5.6 %    Comment: (NOTE)         Pre-diabetes: 5.7 - 6.4         Diabetes: >6.4         Glycemic control for adults with diabetes: <7.0    Mean Plasma Glucose 103 mg/dL    Comment: (NOTE) Performed At: [facility] [place], [place] [id number] Lindon Romp MD QX:[id number] Performed at [facility]   Ethanol     Status: None   Collection Time: 05/24/15  6:21 AM  Result Value Ref Range   Alcohol, Ethyl (B) <5 <5 mg/dL    Comment:        LOWEST DETECTABLE LIMIT FOR SERUM ALCOHOL IS 5 mg/dL FOR MEDICAL PURPOSES ONLY Performed at [facility]   Lipid panel, fasting     Status: Abnormal   Collection Time: 05/24/15  6:21 AM  Result Value Ref Range   Cholesterol 203 (H) 0 - 200 mg/dL   Triglycerides 112 <150 mg/dL   HDL 61 >40 mg/dL   Total CHOL/HDL Ratio 3.3 RATIO   VLDL 22 0 - 40 mg/dL   LDL Cholesterol 120  (H) 0 - 99 mg/dL    Comment:        Total Cholesterol/HDL:CHD Risk Coronary Heart Disease Risk Table  Men   Women  1/2 Average Risk   3.4   3.3  Average Risk       5.0   4.4  2 X Average Risk   9.6   7.1  3 X Average Risk  23.4   11.0        Use the calculated Patient Ratio above and the CHD Risk Table to determine the patient's CHD Risk.        ATP III CLASSIFICATION (LDL):  <100     mg/dL   Optimal  100-129  mg/dL   Near or Above                    Optimal  130-159  mg/dL   Borderline  160-189  mg/dL   High  >190     mg/dL   Very High Performed at [facility]   Hepatic function panel     Status: Abnormal   Collection Time: 05/24/15  6:21 AM  Result Value Ref Range   Total Protein 7.2 6.5 - 8.1 g/dL   Albumin 3.7 3.5 - 5.0 g/dL   AST 22 15 - 41 U/L   ALT 17 14 - 54 U/L   Alkaline Phosphatase 78 38 - 126 U/L   Total Bilirubin 0.4 0.3 - 1.2 mg/dL   Bilirubin, Direct <0.1 (L) 0.1 - 0.5 mg/dL   Indirect Bilirubin NOT CALCULATED 0.3 - 0.9 mg/dL    Comment: Performed at [facility]    Physical Findings: AIMS: Facial and Oral Movements Muscles of Facial Expression: None, normal Lips and Perioral Area: None, normal Jaw: None, normal Tongue: None, normal,Extremity Movements Upper (arms, wrists, hands, fingers): None, normal Lower (legs, knees, ankles, toes): None, normal, Trunk Movements Neck, shoulders, hips: None, normal, Overall Severity Severity of abnormal movements (highest score from questions above): None, normal Incapacitation due to abnormal movements: None, normal Patient's awareness of abnormal movements (rate only patient's report): No Awareness, Dental Status Current problems with teeth and/or dentures?: No Does patient usually wear dentures?: No  CIWA:  CIWA-Ar Total: 1 COWS:     Treatment Plan Summary: Daily contact with patient to assess and evaluate symptoms and progress in treatment and Medication  management Supportive approach/coping skills Substance abuse; continue to work a relapse prevention plan Anxiety; continue to work with psychotropics that would not affect the QT interval QT interval; follow up with an EKG in the AM Mood instability; continue to work with the lamictal/Prozac CBT/mindfulness  Medical Decision Making:  Review of Psycho-Social Stressors (1) and Review of Medication Regimen & Side Effects (2)     Addilee Neu A 05/25/2015, 6:20 PM
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
## 2015-05-25 NOTE — Progress Notes (Signed)
D: Pt denies SI/HI/AVH. Pt is pleasant and cooperative. Pt stated she was feeling better and happier today she saw her step dad , mom and a friend she is hoping to get closer to. Pt did present a little less anxious later in the evening, pt was very tired and stated she should sleep better tonight.   A: Pt was offered support and encouragement. Pt was given scheduled medications. Pt was encourage to attend groups. Q 15 minute checks were done for safety.    R:Pt attends groups and interacts well with peers and staff. Pt is taking medication. Pt has no complaints at this time .Pt receptive to treatment and safety maintained on unit.

## 2015-05-26 DIAGNOSIS — F410 Panic disorder [episodic paroxysmal anxiety] without agoraphobia: Secondary | ICD-10-CM

## 2015-05-26 DIAGNOSIS — F1920 Other psychoactive substance dependence, uncomplicated: Secondary | ICD-10-CM

## 2015-05-26 MED ORDER — GABAPENTIN 400 MG PO CAPS
400.0000 mg | ORAL_CAPSULE | Freq: Four times a day (QID) | ORAL | Status: DC
  Administered 2015-05-26 – 2015-05-30 (×16): 400 mg via ORAL
  Filled 2015-05-26 (×25): qty 1

## 2015-05-26 MED ORDER — TRAZODONE HCL 100 MG PO TABS
200.0000 mg | ORAL_TABLET | Freq: Every evening | ORAL | Status: DC | PRN
  Administered 2015-05-26 – 2015-05-28 (×3): 200 mg via ORAL
  Filled 2015-05-26: qty 6
  Filled 2015-05-26 (×3): qty 2

## 2015-05-26 MED ORDER — LORAZEPAM 1 MG PO TABS
1.0000 mg | ORAL_TABLET | Freq: Three times a day (TID) | ORAL | Status: DC | PRN
  Administered 2015-05-26 – 2015-05-30 (×12): 1 mg via ORAL
  Filled 2015-05-26 (×12): qty 1

## 2015-05-26 NOTE — Progress Notes (Signed)
D: Pt denies SI/HI/AVH. Pt is pleasant and cooperative. Pt stated she felt better today, . Pt plans to go to IOP at [facility] when she leaves. Pt stated she did not feel hopeless today. Pt smiling on the unit and appeared to be in brighter mood upon approach.   A: Pt was offered support and encouragement. Pt was given scheduled medications. Pt was encourage to attend groups. Q 15 minute checks were done for safety.   R:Pt attends groups and interacts well with peers and staff. Pt is taking medication. Pt has no complaints at this time .Pt receptive to treatment and safety maintained on unit.

## 2015-05-26 NOTE — Progress Notes (Signed)
D: Pt presents with flat affect and depressed mood. Pt having a hard time copy today with it being her birthday and also the anniversary of her sister's death. Pt observed having crying spells and irritability throughout the day. Pt rates depression 6/10. Hopeless 5/10. Anxiety 10/10. Pt reported mood changes today. Pt reported difficulty sleeping last night. Pt compliant with taking meds this morning. No adverse reaction to meds verbalized by pt. A: Medications administered as ordered per MD. Verbal support given to pt as needed for depression. Pt encouraged to attend groups. 15 minute checks performed for safety. R: Pt stated goal "trying to get my mood stabilized, anxiety lowered and sleep regulated.

## 2015-05-26 NOTE — [facility] Group Notes (Signed)
[facility] LCSW Group Therapy  05/26/2015 2:47 PM  Type of Therapy:  Group Therapy  Participation Level:  Did Not Attend, invited and chose not to attend  Summary of Progress/Problems:  Finding Balance in Life. Today's group focused on defining balance in one's own words, identifying things that can knock one off balance, and exploring healthy ways to maintain balance in life. Group members were asked to provide an example of a time when they felt off balance, describe how they handled that situation, and process healthier ways to regain balance in the future. Group members were asked to share the most important tool for maintaining balance that they learned while at [facility] and how they plan to apply this method after discharge.   Delanda Bulluck C 05/26/2015, 2:47 PM  

## 2015-05-26 NOTE — Plan of Care (Signed)
Problem: Ineffective individual coping Goal: LTG: Patient will report a decrease in negative feelings Outcome: Progressing Pt stated she did not feel hopeless today Goal: STG: Patient will remain free from self harm Outcome: Progressing Pt safe on the unit   Problem: Diagnosis: Increased Risk For Suicide Attempt Goal: LTG-Patient Will Report Improvement in Psychotic Symptoms LTG (by discharge) : Patient will report improvement in psychotic symptoms.  Outcome: Progressing Pt denies AVH at this time

## 2015-05-26 NOTE — Tx Team (Signed)
Interdisciplinary Treatment Plan Update (Adult) Date: 05/26/2015    Time Reviewed: 9:30 AM  Progress in Treatment: Attending groups: Continuing to assess, patient new to milieu Participating in groups: Continuing to assess, patient new to milieu Taking medication as prescribed: Yes Tolerating medication: Yes Family/Significant other contact made: SPE completed w mother, Orson Eva Patient understands diagnosis: Yes Discussing patient identified problems/goals with staff: Yes Medical problems stabilized or resolved: Yes Denies suicidal/homicidal ideation: Yes Issues/concerns per patient self-inventory: Yes Other:  New problem(s) identified: N/A  Discharge Plan or Barriers: 05/26/2015:  CSW continuing to assess, patient new to milieu.  Reason for Continuation of Hospitalization:  Depression Anxiety Medication Stabilization   Comments: N/A  Estimated length of stay: 3-5 days   Beth [name] is a 34yo female who has been hospitalized multiple times in the past year. She started out in the OBS unit last July for detox, returned to inpt for detox in March of 16. She went to [facility] for 28 days directly, then was in their IOP and going to AA/had a sponsor for 2-1/2 weeks. She was diagnosed with Borderline Personality Disorder. She was going to as many as 8 AA meetings daily in addition to IOP 4 nights a week.After relapsing, she returned here for detox on 03/11/15. Stabilized and went to IOP briefly, but needed oral surgery and dropped out of group. Relapsed. Returned to ED on 6/22 and was sent to [facility] for detox and treatment. Returned home and came to an outpt appointment for medication management in our clinic, where she was referred back to inpatient for the following reasons. Her sister committed suicide in July of 2013, right before her birthday and this time of year is always especially painful.She reports constant anxiety and self medication with Alcohol and  Heroin. She endorses passive SI with no plan or intent, spontaneous bouts of anger and irritability with some violence where she reports she disassociates. She also endorses visual and auditory hallucinations. The patient would benefit from safety monitoring, medication evaluation, psychoeducation, group therapy, and discharge planning to link with ongoing resources. The patient refused referral to [facility] for smoking cessation.    Review of initial/current patient goals per problem list:  1. Goal(s): Patient will participate in aftercare plan   Met: No   Target date: 3-5 days post admission   As evidenced by: Patient will participate within aftercare plan AEB aftercare provider and housing plan at discharge being identified.  7/26: Goal not met: CSW assessing for appropriate referrals for pt and will have follow up secured prior to d/c. 7/28:  Goal met, patient will enter [facility] at [place] and will also follow up w Dr Adele Schilder   2. Goal (s): Patient will exhibit decreased depressive symptoms and suicidal ideations.   Met: No   Target date: 3-5 days post admission   As evidenced by: Patient will utilize self rating of depression at 3 or below and demonstrate decreased signs of depression or be deemed stable for discharge by MD.  7/26: Goal not met: Pt presents with flat affect and depressed mood.  Pt admitted with depression rating of 10.  Pt to show decreased sign of depression and a rating of 3 or less before d/c.   7/28:  Patient rates anxiety as 10/10, presents w severe anxiety, states her mind is racing and she has difficulty remaining grounded, states she feels like she is having a panic attack.    3. Goal(s): Patient will demonstrate decreased signs and symptoms of  anxiety.   Met: No   Target date: 3-5 days post admission   As evidenced by: Patient will utilize self rating of anxiety at 3 or below and demonstrated decreased signs of anxiety, or be deemed stable  for discharge by MD  7/26: Goal not met: Pt presents with anxious mood and affect.  Pt admitted with anxiety rating of 10.  Pt to show decreased sign of anxiety and a rating of 3 or less before d/c. 7/28:  Patient rates depression 6/10, presents w tearful affect, expresses hopelessness, ruminating about her birthday and deaths in her family    5. Goal(s): Patient will demonstrate decreased signs of withdrawal due to substance abuse   Met: Yes   Target date: 3-5 days post admission   As evidenced by: Patient will produce a CIWA/COWS score of 0, have stable vitals signs, and no symptoms of withdrawal  7/26: Goal met: No withdrawal symptoms reported at this time per medical chart.    Attendees: Patient:    Family:    Physician:; Dr. Sabra Heck 05/26/2015 9:30 AM  Nursing: Naida Sleight RN 05/26/2015 9:30 AM  Clinical Social Worker: Edwyna Shell,  LCSW 05/26/2015 9:30 AM  Other: Eduard Clos, LCSWA 05/26/2015 9:30 AM  Other: Lucinda Dell, Beverly Sessions Liaison 05/26/2015 9:30 AM  Other:  Lars Pinks, RN CM 05/26/2015 9:30 AM  Other: Ave Filter, NP 05/26/2015 9:30 AM  Other:       Scribe for Treatment Team:  Edwyna Shell, LCSW Clinical Social Worker

## 2015-05-26 NOTE — Progress Notes (Signed)
[facility] MD Progress Note  05/26/2015 6:55 PM Beth [name]  MRN:  [id number] Subjective:  Beth [name] had a severe panic attack early today. She states the Ativan at 0.5 mg is not helping. She required staff intervention to talk her trough. She states when she has those episodes she has a hard time using her skills. Her EKG is back to normal. She is excited about it. She has a birthday today and he is trying to refocus. Worried about the panic returning Principal Problem: <principal problem not specified> Diagnosis:   Patient Active Problem List   Diagnosis Date Noted  . 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 Polysubstance dependence [F19.20]     Priority: High  . Alcohol use disorder, moderate, dependence [F10.20] 01/13/2015    Priority: High  . Major depressive disorder, recurrent episode [F33.9] 05/23/2015  . Suicidal ideations [R45.851]   . Substance induced mood disorder [F19.94]   . Substance abuse [F19.10] 03/11/2015  . 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 Free intraperitoneal air [K66.8] 08/18/2014  . Perforated ulcer [K27.5] 08/18/2014  . 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 Acid reflux [K21.9]   . History of blood transfusion [Z92.89]   . Multiple sclerosis [G35] 03/13/2012  . H/O gastric bypass [Z98.84] 03/13/2012   Total Time spent with patient: 30 minutes   Past Medical History:  Past Medical History  Diagnosis Date  . Acid reflux   . Asthma   . History of blood transfusion   . Multiple sclerosis 2005    diagnosed in [place], failed steroids, no other medication  . Anxiety   . Gastric ulcer   . Migraine   . Chronic nausea     self reported  . Chronic diarrhea     self reported  . Chronic pain     self reported chronic pain, back pain, fibromyalgia  . Insomnia   . Polysubstance abuse   . ETOH  abuse   . Delirium tremens   . Depression     Past Surgical History  Procedure Laterality Date  . Gastric bypass    . Diagnostic laparoscopy  2003    repair of perforation at [facility] anastamosis   . Lumbar laminectomy/decompression microdiscectomy Right 04/14/2014    Procedure: LUMBAR LAMINECTOMY/DECOMPRESSION MICRODISCECTOMY LEVEL L4-5;  Surgeon: Maeola Harman, MD;  Location: [facility];  Service: Neurosurgery;  Laterality: Right;  LUMBAR LAMINECTOMY/DECOMPRESSION MICRODISCECTOMY LEVEL L4-5  . Laparotomy N/A 08/18/2014    Procedure: EXPLORATORY LAPAROTOMY, Patch graft of gastric perforation;  Surgeon: Abigail Miyamoto, MD;  Location: [facility] OR;  Service: General;  Laterality: N/A;   Family History:  Family History  Problem Relation Age of Onset  . Hypertension Paternal Grandfather   . Colon cancer Paternal Grandfather 71  . Asthma Paternal Grandmother   . Hypertension Paternal Grandmother   . Breast cancer Paternal Grandmother   . Cancer Paternal Grandmother     lung   . Diabetes Maternal Grandmother   . Diabetes Maternal Grandfather   . Hypertension Father   . Alcohol abuse Father   . Urinary tract infection Mother   . Stroke Mother 73    3 strokes    Social History:  History  Alcohol Use  . 195.6 oz/week  . 25 Cans of beer, 301 Shots of liquor per week    Comment: reports shes a "light drinker" now- drinks 1  pt of vodka and 2 24 oz beers     History  Drug Use  . Yes  . Special: Marijuana, Cocaine, Methamphetamines, Oxycodone, Heroin    Comment: cocaine-none >1 year.  meth-none >10 years.    History   Social History  . Marital Status: Single    Spouse Name: N/A  . Number of Children: N/A  . Years of Education: N/A   Social History Main Topics  . Smoking status: Current Some Day Smoker -- 0.50 packs/day    Types: Cigarettes  . Smokeless tobacco: Never Used  . Alcohol Use: 195.6 oz/week    25 Cans of beer, 301 Shots of liquor per week     Comment: reports shes a  "light drinker" now- drinks 1 pt of vodka and 2 24 oz beers  . Drug Use: Yes    Special: Marijuana, Cocaine, Methamphetamines, Oxycodone, Heroin     Comment: cocaine-none >1 year.  meth-none >10 years.  . Sexual Activity: Yes    Birth Control/ Protection: None   Other Topics Concern  . None   Social History Narrative   Additional History:    Sleep: Fair  Appetite:  Fair   Assessment:   Musculoskeletal: Strength & Muscle Tone: within normal limits Gait & Station: normal Patient leans: normal   Psychiatric Specialty Exam: Physical Exam  Review of Systems  Constitutional: Negative.   HENT: Negative.   Eyes: Negative.   Respiratory: Negative.   Cardiovascular: Negative.   Gastrointestinal: Negative.   Genitourinary: Negative.   Musculoskeletal: Negative.   Skin: Negative.   Neurological: Negative.   Endo/Heme/Allergies: Negative.   Psychiatric/Behavioral: Positive for depression. The patient is nervous/anxious.     Blood pressure 115/77, pulse 83, temperature 97.8 F (36.6 C), temperature source Oral, resp. rate 18, height 5' 5.5" (1.664 m), weight 117.028 kg (258 lb).Body mass index is 42.27 kg/(m^2).  General Appearance: Fairly Groomed  Patent attorney::  Fair  Speech:  Clear and Coherent  Volume:  Normal  Mood:  Anxious  Affect:  anxious worried  Thought Process:  Coherent and Goal Directed  Orientation:  Full (Time, Place, and Person)  Thought Content:  symptoms events worries concerns  Suicidal Thoughts:  No  Homicidal Thoughts:  No  Memory:  Immediate;   Fair Recent;   Fair Remote;   Fair  Judgement:  Fair  Insight:  Present  Psychomotor Activity:  Restlessness  Concentration:  Fair  Recall:  [employer] of Knowledge:Fair  Language: Fair  Akathisia:  No  Handed:  Right  AIMS (if indicated):     Assets:  Desire for Improvement Housing Social Support  ADL's:  Intact  Cognition: WNL  Sleep:  Number of Hours: 6.5     Current Medications: Current  Facility-Administered Medications  Medication Dose Route Frequency Provider Last Rate Last Dose  . acetaminophen (TYLENOL) tablet 650 mg  650 mg Oral Q6H PRN Sanjuana Kava, NP      . alum & mag hydroxide-simeth (MAALOX/MYLANTA) 200-200-20 MG/5ML suspension 30 mL  30 mL Oral Q4H PRN Sanjuana Kava, NP      . bacitracin ointment   Topical BID Sanjuana Kava, NP      . FLUoxetine (PROZAC) capsule 20 mg  20 mg Oral Daily Rachael Fee, MD   20 mg at 05/26/15 0758  . gabapentin (NEURONTIN) capsule 400 mg  400 mg Oral QID Rachael Fee, MD      . ketorolac (TORADOL) tablet 10 mg  10  mg Oral Q6H PRN Rachael Fee, MD   10 mg at 05/26/15 1120  . lamoTRIgine (LAMICTAL) tablet 200 mg  200 mg Oral Daily Cleotis Nipper, MD   200 mg at 05/26/15 0758  . LORazepam (ATIVAN) tablet 1 mg  1 mg Oral TID PRN Rachael Fee, MD   1 mg at 05/26/15 1837  . magnesium hydroxide (MILK OF MAGNESIA) suspension 30 mL  30 mL Oral Daily PRN Sanjuana Kava, NP      . methocarbamol (ROBAXIN) tablet 500 mg  500 mg Oral QID Rachael Fee, MD   500 mg at 05/26/15 1637  . neomycin-bacitracin-polymyxin (NEOSPORIN) ointment   Topical BID Jomarie Longs, MD      . nicotine polacrilex (NICORETTE) gum 2 mg  2 mg Oral PRN Rachael Fee, MD   2 mg at 05/26/15 1853  . pantoprazole (PROTONIX) EC tablet 20 mg  20 mg Oral Daily Rachael Fee, MD   20 mg at 05/26/15 0759  . prazosin (MINIPRESS) capsule 1 mg  1 mg Oral QHS Cleotis Nipper, MD   1 mg at 05/25/15 2213  . SUMAtriptan (IMITREX) tablet 50 mg  50 mg Oral BID PRN Rachael Fee, MD   50 mg at 05/26/15 1600  . traZODone (DESYREL) tablet 200 mg  200 mg Oral QHS PRN Rachael Fee, MD        Lab Results: No results found for this or any previous visit (from the past 48 hour(s)).  Physical Findings: AIMS: Facial and Oral Movements Muscles of Facial Expression: None, normal Lips and Perioral Area: None, normal Jaw: None, normal Tongue: None, normal,Extremity Movements Upper (arms, wrists,  hands, fingers): None, normal Lower (legs, knees, ankles, toes): None, normal, Trunk Movements Neck, shoulders, hips: None, normal, Overall Severity Severity of abnormal movements (highest score from questions above): None, normal Incapacitation due to abnormal movements: None, normal Patient's awareness of abnormal movements (rate only patient's report): No Awareness, Dental Status Current problems with teeth and/or dentures?: No Does patient usually wear dentures?: No  CIWA:  CIWA-Ar Total: 1 COWS:     Treatment Plan Summary: Daily contact with patient to assess and evaluate symptoms and progress in treatment and Medication management Supportive approach/coping skills Depression; will continue the Prozac 20 mg daily Mood instability; will continue the Lamictal at 200 mg Insomnia; will increase the Trazodone to 200 mg HS PRN sleep Acute anxiety/panic; will increase the Ativan to 1.0 mg PRN CBT/mindfulness  Medical Decision Making:  Review of Psycho-Social Stressors (1), Review of Medication Regimen & Side Effects (2) and Review of New Medication or Change in Dosage (2)     Avannah Decker A 05/26/2015, 6:55 PM

## 2015-05-27 ENCOUNTER — Other Ambulatory Visit (HOSPITAL_COMMUNITY): Payer: BLUE CROSS/BLUE SHIELD

## 2015-05-27 MED ORDER — LORATADINE 10 MG PO TABS
10.0000 mg | ORAL_TABLET | Freq: Every day | ORAL | Status: DC
  Administered 2015-05-27 – 2015-05-30 (×4): 10 mg via ORAL
  Filled 2015-05-27 (×7): qty 1

## 2015-05-27 MED ORDER — FLUOXETINE HCL 20 MG PO CAPS
30.0000 mg | ORAL_CAPSULE | Freq: Every day | ORAL | Status: DC
  Administered 2015-05-28 – 2015-05-30 (×3): 30 mg via ORAL
  Filled 2015-05-27 (×5): qty 1

## 2015-05-27 MED ORDER — IBUPROFEN 800 MG PO TABS
800.0000 mg | ORAL_TABLET | Freq: Four times a day (QID) | ORAL | Status: DC | PRN
  Administered 2015-05-27 – 2015-05-30 (×5): 800 mg via ORAL
  Filled 2015-05-27 (×5): qty 1

## 2015-05-27 NOTE — [facility] Group Notes (Signed)
[facility] LCSW Group Therapy  05/27/2015 2:37 PM  Type of Therapy:  Group Therapy  Participation Level:  Did Not Attend, invited  Summary of Progress/Problems: The topic for today was feelings about relapse. Pt discussed what relapse prevention is to them and identified triggers that they are on the path to relapse. Pt processed their feeling towards relapse and was able to relate to peers. Pt discussed coping skills that can be used for relapse prevention.   Sallee Lange 05/27/2015, 2:37 PM

## 2015-05-27 NOTE — Progress Notes (Signed)
[facility] MD Progress Note  05/27/2015 6:01 PM Beth [name]  MRN:  [id number] Subjective:  Beth [name] states that overall she is feeling better. She is still dealing with the depression. States she has been isolating and that this is not like her. She also states she is having back pain.  Principal Problem: <principal problem not specified> Diagnosis:   Patient Active Problem List   Diagnosis Date Noted  . Panic disorder without agoraphobia with severe panic attacks [F41.0] 05/26/2015    Priority: High  . 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 Polysubstance dependence [F19.20]     Priority: High  . Alcohol use disorder, moderate, dependence [F10.20] 01/13/2015    Priority: High  . Major depressive disorder, recurrent episode [F33.9] 05/23/2015  . Suicidal ideations [R45.851]   . Substance induced mood disorder [F19.94]   . Substance abuse [F19.10] 03/11/2015  . 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 Free intraperitoneal air [K66.8] 08/18/2014  . Perforated ulcer [K27.5] 08/18/2014  . 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 Acid reflux [K21.9]   . History of blood transfusion [Z92.89]   . Multiple sclerosis [G35] 03/13/2012  . H/O gastric bypass [Z98.84] 03/13/2012   Total Time spent with patient: 30 minutes   Past Medical History:  Past Medical History  Diagnosis Date  . Acid reflux   . Asthma   . History of blood transfusion   . Multiple sclerosis 2005    diagnosed in [place], failed steroids, no other medication  . Anxiety   . Gastric ulcer   . Migraine   . Chronic nausea     self reported  . Chronic diarrhea     self reported  . Chronic pain     self reported chronic pain, back pain, fibromyalgia  . Insomnia   . Polysubstance abuse   . ETOH abuse   . Delirium tremens   . Depression     Past Surgical  History  Procedure Laterality Date  . Gastric bypass    . Diagnostic laparoscopy  2003    repair of perforation at [facility] anastamosis   . Lumbar laminectomy/decompression microdiscectomy Right 04/14/2014    Procedure: LUMBAR LAMINECTOMY/DECOMPRESSION MICRODISCECTOMY LEVEL L4-5;  Surgeon: Maeola Harman, MD;  Location: [facility];  Service: Neurosurgery;  Laterality: Right;  LUMBAR LAMINECTOMY/DECOMPRESSION MICRODISCECTOMY LEVEL L4-5  . Laparotomy N/A 08/18/2014    Procedure: EXPLORATORY LAPAROTOMY, Patch graft of gastric perforation;  Surgeon: Abigail Miyamoto, MD;  Location: [facility] OR;  Service: General;  Laterality: N/A;   Family History:  Family History  Problem Relation Age of Onset  . Hypertension Paternal Grandfather   . Colon cancer Paternal Grandfather 73  . Asthma Paternal Grandmother   . Hypertension Paternal Grandmother   . Breast cancer Paternal Grandmother   . Cancer Paternal Grandmother     lung   . Diabetes Maternal Grandmother   . Diabetes Maternal Grandfather   . Hypertension Father   . Alcohol abuse Father   . Urinary tract infection Mother   . Stroke Mother 54    3 strokes    Social History:  History  Alcohol Use  . 195.6 oz/week  . 25 Cans of beer, 301 Shots of liquor per week    Comment: reports shes a "light drinker" now- drinks 1 pt of vodka and 2 24 oz beers     History  Drug  Use  . Yes  . Special: Marijuana, Cocaine, Methamphetamines, Oxycodone, Heroin    Comment: cocaine-none >1 year.  meth-none >10 years.    History   Social History  . Marital Status: Single    Spouse Name: N/A  . Number of Children: N/A  . Years of Education: N/A   Social History Main Topics  . Smoking status: Current Some Day Smoker -- 0.50 packs/day    Types: Cigarettes  . Smokeless tobacco: Never Used  . Alcohol Use: 195.6 oz/week    25 Cans of beer, 301 Shots of liquor per week     Comment: reports shes a "light drinker" now- drinks 1 pt of vodka and 2 24 oz beers  . Drug  Use: Yes    Special: Marijuana, Cocaine, Methamphetamines, Oxycodone, Heroin     Comment: cocaine-none >1 year.  meth-none >10 years.  . Sexual Activity: Yes    Birth Control/ Protection: None   Other Topics Concern  . None   Social History Narrative   Additional History:    Sleep: Fair  Appetite:  Fair   Assessment:   Musculoskeletal: Strength & Muscle Tone: within normal limits Gait & Station: normal Patient leans: normal   Psychiatric Specialty Exam: Physical Exam  Review of Systems  Constitutional: Negative.   HENT: Negative.   Eyes: Negative.   Respiratory: Negative.   Cardiovascular: Negative.   Gastrointestinal: Negative.   Genitourinary: Negative.   Musculoskeletal: Positive for back pain.  Skin: Negative.   Neurological: Negative.   Endo/Heme/Allergies: Negative.   Psychiatric/Behavioral: Positive for depression and substance abuse. The patient is nervous/anxious.     Blood pressure 111/59, pulse 73, temperature 97.2 F (36.2 C), temperature source Oral, resp. rate 20, height 5' 5.5" (1.664 m), weight 117.028 kg (258 lb).Body mass index is 42.27 kg/(m^2).  General Appearance: Fairly Groomed  Patent attorney::  Fair  Speech:  Clear and Coherent  Volume:  Decreased  Mood:  Anxious and Depressed  Affect:  Appropriate  Thought Process:  Coherent and Goal Directed  Orientation:  Full (Time, Place, and Person)  Thought Content:  symptoms events worries concerns  Suicidal Thoughts:  No  Homicidal Thoughts:  No  Memory:  Immediate;   Fair Recent;   Fair Remote;   Fair  Judgement:  Fair  Insight:  Present and Shallow  Psychomotor Activity:  Normal  Concentration:  Fair  Recall:  [employer] of Knowledge:Fair  Language: Fair  Akathisia:  No  Handed:  Right  AIMS (if indicated):     Assets:  Desire for Improvement Housing Social Support  ADL's:  Intact  Cognition: WNL  Sleep:  Number of Hours: 6.75     Current Medications: Current  Facility-Administered Medications  Medication Dose Route Frequency Provider Last Rate Last Dose  . acetaminophen (TYLENOL) tablet 650 mg  650 mg Oral Q6H PRN Sanjuana Kava, NP      . alum & mag hydroxide-simeth (MAALOX/MYLANTA) 200-200-20 MG/5ML suspension 30 mL  30 mL Oral Q4H PRN Sanjuana Kava, NP      . bacitracin ointment   Topical BID Sanjuana Kava, NP      . Melene Muller ON 05/28/2015] FLUoxetine (PROZAC) capsule 30 mg  30 mg Oral Daily Rachael Fee, MD      . gabapentin (NEURONTIN) capsule 400 mg  400 mg Oral QID Rachael Fee, MD   400 mg at 05/27/15 1628  . ibuprofen (ADVIL,MOTRIN) tablet 800 mg  800 mg Oral Q6H PRN  Rachael Fee, MD      . ketorolac (TORADOL) tablet 10 mg  10 mg Oral Q6H PRN Rachael Fee, MD   10 mg at 05/27/15 1202  . lamoTRIgine (LAMICTAL) tablet 200 mg  200 mg Oral Daily Cleotis Nipper, MD   200 mg at 05/27/15 0841  . loratadine (CLARITIN) tablet 10 mg  10 mg Oral Daily Rachael Fee, MD   10 mg at 05/27/15 1629  . LORazepam (ATIVAN) tablet 1 mg  1 mg Oral TID PRN Rachael Fee, MD   1 mg at 05/27/15 0844  . magnesium hydroxide (MILK OF MAGNESIA) suspension 30 mL  30 mL Oral Daily PRN Sanjuana Kava, NP      . methocarbamol (ROBAXIN) tablet 500 mg  500 mg Oral QID Rachael Fee, MD   500 mg at 05/27/15 1629  . neomycin-bacitracin-polymyxin (NEOSPORIN) ointment   Topical BID Jomarie Longs, MD      . nicotine polacrilex (NICORETTE) gum 2 mg  2 mg Oral PRN Rachael Fee, MD   2 mg at 05/27/15 1631  . pantoprazole (PROTONIX) EC tablet 20 mg  20 mg Oral Daily Rachael Fee, MD   20 mg at 05/27/15 0841  . prazosin (MINIPRESS) capsule 1 mg  1 mg Oral QHS Cleotis Nipper, MD   1 mg at 05/26/15 2159  . SUMAtriptan (IMITREX) tablet 50 mg  50 mg Oral BID PRN Rachael Fee, MD   50 mg at 05/27/15 0844  . traZODone (DESYREL) tablet 200 mg  200 mg Oral QHS PRN Rachael Fee, MD   200 mg at 05/26/15 2159    Lab Results: No results found for this or any previous visit (from the past 48  hour(s)).  Physical Findings: AIMS: Facial and Oral Movements Muscles of Facial Expression: None, normal Lips and Perioral Area: None, normal Jaw: None, normal Tongue: None, normal,Extremity Movements Upper (arms, wrists, hands, fingers): None, normal Lower (legs, knees, ankles, toes): None, normal, Trunk Movements Neck, shoulders, hips: None, normal, Overall Severity Severity of abnormal movements (highest score from questions above): None, normal Incapacitation due to abnormal movements: None, normal Patient's awareness of abnormal movements (rate only patient's report): No Awareness, Dental Status Current problems with teeth and/or dentures?: No Does patient usually wear dentures?: No  CIWA:  CIWA-Ar Total: 1 COWS:     Treatment Plan Summary: Daily contact with patient to assess and evaluate symptoms and progress in treatment and Medication management Supportive approach/coping skills Alcohol abuse; continue to work a relapse prevention plan Depression; will increase the Prozac to 30 mg daily Mood instability; continue the Lamictal 200 mg daily Anxiety; will continue the Neurontin and the Ativan PRN Back pain; will add motrin 800 mg Q 6 PRN anxiety CBT/mindfulness  Medical Decision Making:  Review of Psycho-Social Stressors (1) and Review of Medication Regimen & Side Effects (2)     Birdie Beveridge A 05/27/2015, 6:01 PM

## 2015-05-27 NOTE — Progress Notes (Signed)
D: When asked about her day pt stated, "today was up and down". Informed the writer that she's ready to get her drinking violation over with. Stated she's glad she was here for her birthday and hopes that she'll be here on Aug 1st. Stated that's the day she was arrested and charged with a dui and it was the same day as her sister's funeral 3 yrs ago.   A:  Support and encouragement was offered. 15 min checks continued for safety.  R: Pt remains safe.

## 2015-05-27 NOTE — [facility] Group Notes (Signed)
[facility] LCSW Aftercare Discharge Planning Group Note   05/27/2015 10:27 AM  Participation Quality:  engaged  Mood/Affect:  Depressed and Lethargic  Depression Rating:  6  Anxiety Rating:  1  Thoughts of Suicide:  No Will you contract for safety?   Yes  Current AVH:  No  Plan for Discharge/Comments:  Will enter MH IOP at [facility] and follow up w Dr Lolly Mustache  Transportation Means: mother/stepfather  Supports:  Parents very supportive, encouraged 12 step groups  Sallee Lange

## 2015-05-27 NOTE — Progress Notes (Signed)
D:Affect is appropriate to mood. Easily agitated, labile and irritable at times. Says she feels a little better today but is still depressed as she rates her depression as a 7 today. A:Support and encouragement offered. PRN meds as needed. R:Receptive. Safety maintained.

## 2015-05-27 NOTE — Progress Notes (Signed)
[facility] Group Notes:  (Nursing/MHT/Case Management/Adjunct)  Date:  05/27/2015  Time:  2100  Type of Therapy:  wrap up group  Participation Level:  Active  Participation Quality:  Appropriate, Attentive, Sharing and Supportive  Affect:  Appropriate  Cognitive:  Appropriate  Insight:  Lacking  Engagement in Group:  Engaged  Modes of Intervention:  Clarification, Education and Support  Summary of Progress/Problems:  Beth [name] 05/27/2015, 10:42 PM

## 2015-05-27 NOTE — Progress Notes (Signed)
  D: Pt was observed interacting with her peers in the dayroom. Pt was appropriate on the unit and during the assessment. Pt informed the writer the she was feeling anxious, and requested ativan, as well as trazodone for sleep.   A: Pt was given prn meds. Support and encouragement was offered. 15 min checks continued for safety.  R: Pt remains safe.

## 2015-05-27 NOTE — Progress Notes (Signed)
Recreation Therapy Notes  Date: 07.29.16 Time: 9:30 am Location: [address] Group Room  Group Topic: Stress Management  Goal Area(s) Addresses:  Patient will verbalize importance of using healthy stress management.  Patient will identify positive emotions associated with healthy stress management.   Intervention: Stress Management  Activity :  Progressive Muscle Relaxation.  LRT introduced and educated patients on the technique of progressive muscle relaxation.  A script was used to deliver the technique to the patients.  Patients were asked to follow the script read a loud by the LRT to engage in practicing the stress management technique.  Education:  Stress Management, Discharge Planning.   Education Outcome: Acknowledges edcuation/In group clarification offered/Needs additional education  Clinical Observations/Feedback: Patient did not attend group.   Caroll Rancher, LRT/CTRS         Caroll Rancher A 05/27/2015 3:14 PM

## 2015-05-28 DIAGNOSIS — F332 Major depressive disorder, recurrent severe without psychotic features: Principal | ICD-10-CM

## 2015-05-28 MED ORDER — ARIPIPRAZOLE 2 MG PO TABS
2.0000 mg | ORAL_TABLET | Freq: Every day | ORAL | Status: DC
  Administered 2015-05-28 – 2015-05-29 (×2): 2 mg via ORAL
  Filled 2015-05-28 (×5): qty 1

## 2015-05-28 MED ORDER — LIDOCAINE 5 % EX PTCH
1.0000 | MEDICATED_PATCH | CUTANEOUS | Status: DC
Start: 2015-05-28 — End: 2015-05-30
  Administered 2015-05-28 – 2015-05-30 (×3): 1 via TRANSDERMAL
  Filled 2015-05-28 (×4): qty 1

## 2015-05-28 NOTE — Progress Notes (Signed)
[facility] MD Progress Note  05/28/2015 10:00 AM Beth [name]  MRN:  [id number] Subjective:  I still have a lot of irritability and anger.  I think I need medication to help my mood.  I still have difficulty sleeping.    Objective  Patient seen chart reviewed.  Patient is taking Prozac, Lamictal and Ativan , trazodone and prazosin.  But she feel improvement from the past but she still have irritability, anger, having hallucination and anxiety.  Sometimes she feels sad and hopeless .  She continued to endorse nightmares flashback and racing thoughts.  She is hoping to attend mental health outpatient for the management of her psychiatric illness.  She's attending the groups and appears anxious but able to participate.  She is complaining of back pain and she was given Motrin.    Principal Problem: Major depressive disorder, recurrent, severe without psychotic features Diagnosis:   Patient Active Problem List   Diagnosis Date Noted  . Panic disorder without agoraphobia with severe panic attacks [F41.0] 05/26/2015  . Major depressive disorder, recurrent episode [F33.9] 05/23/2015  . Suicidal ideations [R45.851]   . Major depressive disorder, recurrent, severe without psychotic features [F33.2]   . Polysubstance dependence [F19.20]   . Substance induced mood disorder [F19.94]   . Substance abuse [F19.10] 03/11/2015  . 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 Alcohol use disorder, moderate, dependence [F10.20] 01/13/2015  . Free intraperitoneal air [K66.8] 08/18/2014  . Perforated ulcer [K27.5] 08/18/2014  . 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 Acid reflux [K21.9]   . History of blood transfusion [Z92.89]   . Multiple sclerosis [G35] 03/13/2012  . H/O gastric bypass [Z98.84] 03/13/2012   Total Time spent with patient: 30 minutes   Past Medical History:  Past Medical History  Diagnosis  Date  . Acid reflux   . Asthma   . History of blood transfusion   . Multiple sclerosis 2005    diagnosed in [place], failed steroids, no other medication  . Anxiety   . Gastric ulcer   . Migraine   . Chronic nausea     self reported  . Chronic diarrhea     self reported  . Chronic pain     self reported chronic pain, back pain, fibromyalgia  . Insomnia   . Polysubstance abuse   . ETOH abuse   . Delirium tremens   . Depression     Past Surgical History  Procedure Laterality Date  . Gastric bypass    . Diagnostic laparoscopy  2003    repair of perforation at [facility] anastamosis   . Lumbar laminectomy/decompression microdiscectomy Right 04/14/2014    Procedure: LUMBAR LAMINECTOMY/DECOMPRESSION MICRODISCECTOMY LEVEL L4-5;  Surgeon: Maeola Harman, MD;  Location: [facility];  Service: Neurosurgery;  Laterality: Right;  LUMBAR LAMINECTOMY/DECOMPRESSION MICRODISCECTOMY LEVEL L4-5  . Laparotomy N/A 08/18/2014    Procedure: EXPLORATORY LAPAROTOMY, Patch graft of gastric perforation;  Surgeon: Abigail Miyamoto, MD;  Location: [facility] OR;  Service: General;  Laterality: N/A;   Family History:  Family History  Problem Relation Age of Onset  . Hypertension Paternal Grandfather   . Colon cancer Paternal Grandfather 31  . Asthma Paternal Grandmother   . Hypertension Paternal Grandmother   . Breast cancer Paternal Grandmother   . Cancer Paternal Grandmother     lung   . Diabetes Maternal Grandmother   . Diabetes Maternal Grandfather   . Hypertension Father   . Alcohol  abuse Father   . Urinary tract infection Mother   . Stroke Mother 77    3 strokes    Social History:  History  Alcohol Use  . 195.6 oz/week  . 25 Cans of beer, 301 Shots of liquor per week    Comment: reports shes a "light drinker" now- drinks 1 pt of vodka and 2 24 oz beers     History  Drug Use  . Yes  . Special: Marijuana, Cocaine, Methamphetamines, Oxycodone, Heroin    Comment: cocaine-none >1 year.  meth-none >10  years.    History   Social History  . Marital Status: Single    Spouse Name: N/A  . Number of Children: N/A  . Years of Education: N/A   Social History Main Topics  . Smoking status: Current Some Day Smoker -- 0.50 packs/day    Types: Cigarettes  . Smokeless tobacco: Never Used  . Alcohol Use: 195.6 oz/week    25 Cans of beer, 301 Shots of liquor per week     Comment: reports shes a "light drinker" now- drinks 1 pt of vodka and 2 24 oz beers  . Drug Use: Yes    Special: Marijuana, Cocaine, Methamphetamines, Oxycodone, Heroin     Comment: cocaine-none >1 year.  meth-none >10 years.  . Sexual Activity: Yes    Birth Control/ Protection: None   Other Topics Concern  . None   Social History Narrative   Additional History:    Sleep: Fair  Appetite:  Fair   Assessment:   Musculoskeletal: Strength & Muscle Tone: within normal limits Gait & Station: normal Patient leans: normal   Psychiatric Specialty Exam: Physical Exam  Review of Systems  Constitutional: Negative.   HENT: Negative.   Eyes: Negative.   Respiratory: Negative.   Cardiovascular: Negative.   Gastrointestinal: Negative.   Genitourinary: Negative.   Musculoskeletal: Positive for back pain.  Skin: Negative.   Neurological: Negative.   Endo/Heme/Allergies: Negative.   Psychiatric/Behavioral: Positive for depression and substance abuse. The patient is nervous/anxious.     Blood pressure 105/77, pulse 99, temperature 97.7 F (36.5 C), temperature source Oral, resp. rate 20, height 5' 5.5" (1.664 m), weight 117.028 kg (258 lb).Body mass index is 42.27 kg/(m^2).  General Appearance: Fairly Groomed  Patent attorney::  Fair  Speech:  Clear and Coherent  Volume:  Decreased  Mood:  Anxious and Depressed  Affect:  Appropriate  Thought Process:  Coherent and Goal Directed  Orientation:  Full (Time, Place, and Person)  Thought Content:  Hallucinations: Auditory But getting better  Suicidal Thoughts:  No   Homicidal Thoughts:  No  Memory:  Immediate;   Fair Recent;   Fair Remote;   Fair  Judgement:  Fair  Insight:  Present and Shallow  Psychomotor Activity:  Normal  Concentration:  Fair  Recall:  [employer] of Knowledge:Fair  Language: Fair  Akathisia:  No  Handed:  Right  AIMS (if indicated):     Assets:  Desire for Improvement Housing Social Support  ADL's:  Intact  Cognition: WNL  Sleep:  Number of Hours: 6.75     Current Medications: Current Facility-Administered Medications  Medication Dose Route Frequency Provider Last Rate Last Dose  . acetaminophen (TYLENOL) tablet 650 mg  650 mg Oral Q6H PRN Sanjuana Kava, NP      . alum & mag hydroxide-simeth (MAALOX/MYLANTA) 200-200-20 MG/5ML suspension 30 mL  30 mL Oral Q4H PRN Sanjuana Kava, NP      .  ARIPiprazole (ABILIFY) tablet 2 mg  2 mg Oral Daily Cleotis Nipper, MD      . bacitracin ointment   Topical BID Sanjuana Kava, NP      . FLUoxetine (PROZAC) capsule 30 mg  30 mg Oral Daily Rachael Fee, MD   30 mg at 05/28/15 0802  . gabapentin (NEURONTIN) capsule 400 mg  400 mg Oral QID Rachael Fee, MD   400 mg at 05/28/15 0802  . ibuprofen (ADVIL,MOTRIN) tablet 800 mg  800 mg Oral Q6H PRN Rachael Fee, MD   800 mg at 05/27/15 1844  . ketorolac (TORADOL) tablet 10 mg  10 mg Oral Q6H PRN Rachael Fee, MD   10 mg at 05/27/15 1202  . lamoTRIgine (LAMICTAL) tablet 200 mg  200 mg Oral Daily Cleotis Nipper, MD   200 mg at 05/28/15 0802  . loratadine (CLARITIN) tablet 10 mg  10 mg Oral Daily Rachael Fee, MD   10 mg at 05/28/15 0802  . LORazepam (ATIVAN) tablet 1 mg  1 mg Oral TID PRN Rachael Fee, MD   1 mg at 05/28/15 0809  . magnesium hydroxide (MILK OF MAGNESIA) suspension 30 mL  30 mL Oral Daily PRN Sanjuana Kava, NP      . methocarbamol (ROBAXIN) tablet 500 mg  500 mg Oral QID Rachael Fee, MD   500 mg at 05/28/15 0802  . neomycin-bacitracin-polymyxin (NEOSPORIN) ointment   Topical BID Jomarie Longs, MD      . nicotine  polacrilex (NICORETTE) gum 2 mg  2 mg Oral PRN Rachael Fee, MD   2 mg at 05/27/15 1631  . pantoprazole (PROTONIX) EC tablet 20 mg  20 mg Oral Daily Rachael Fee, MD   20 mg at 05/28/15 0803  . prazosin (MINIPRESS) capsule 1 mg  1 mg Oral QHS Cleotis Nipper, MD   1 mg at 05/27/15 2129  . SUMAtriptan (IMITREX) tablet 50 mg  50 mg Oral BID PRN Rachael Fee, MD   50 mg at 05/28/15 0809  . traZODone (DESYREL) tablet 200 mg  200 mg Oral QHS PRN Rachael Fee, MD   200 mg at 05/27/15 2131    Lab Results: No results found for this or any previous visit (from the past 48 hour(s)).  Physical Findings: AIMS: Facial and Oral Movements Muscles of Facial Expression: None, normal Lips and Perioral Area: None, normal Jaw: None, normal Tongue: None, normal,Extremity Movements Upper (arms, wrists, hands, fingers): None, normal Lower (legs, knees, ankles, toes): None, normal, Trunk Movements Neck, shoulders, hips: None, normal, Overall Severity Severity of abnormal movements (highest score from questions above): None, normal Incapacitation due to abnormal movements: None, normal Patient's awareness of abnormal movements (rate only patient's report): No Awareness, Dental Status Current problems with teeth and/or dentures?: No Does patient usually wear dentures?: No  CIWA:  CIWA-Ar Total: 1 COWS:     Treatment Plan Summary: Daily contact with patient to assess and evaluate symptoms and progress in treatment and Medication management Supportive approach/coping skills Alcohol abuse; continue to work a relapse prevention plan Depression; I will add low-dose Abilify 2 mg to help with residual depression and psychosis.  Continue Prozac 30 mg daily Mood instability; continue the Lamictal 200 mg daily Anxiety; will continue the Neurontin and the Ativan PRN Back pain; will add motrin 800 mg Q 6 PRN anxiety CBT/mindfulness Discussed medication side effects and benefits.  At this time she does not have  any  tremors shakes or any EPS.  Medical Decision Making:  Review of Psycho-Social Stressors (1) and Review of Medication Regimen & Side Effects (2)     William Schake T. 05/28/2015, 10:00 AM

## 2015-05-28 NOTE — Progress Notes (Signed)
Patient ID: Beth [name], female   DOB: 07-08-1981, 34 y.o.   MRN: [id number] Adult Psychoeducational Group Note  Date:  05/28/2015 Time: 09:15am  Group Topic/Focus:  Orientation:   The focus of this group is to educate the patient on the purpose and policies of crisis stabilization and provide a format to answer questions about their admission.  The group details unit policies and expectations of patients while admitted.  Participation Level:  Active  Participation Quality:  Appropriate and Attentive  Affect:  Anxious  Cognitive:  Alert, Appropriate and Oriented  Insight: Improving  Engagement in Group:  Engaged  Modes of Intervention:  Activity, Discussion, Orientation and Support  Additional Comments:  Pt able to identify one daily goal to accomplish today.  Aurora Mask 05/28/2015, 11:08 AM

## 2015-05-28 NOTE — Progress Notes (Signed)
[facility] Group Notes:  (Nursing/MHT/Case Management/Adjunct)  Date:  05/28/2015  Time:  2100  Type of Therapy:  wrap up group  Participation Level:  Active  Participation Quality:  Appropriate, Inattentive, Redirectable, Sharing and Supportive  Affect:  Appropriate  Cognitive:  Appropriate  Insight:  Lacking  Engagement in Group:  Developing/Improving and Distracting  Modes of Intervention:  Clarification, Education and Support  Summary of Progress/Problems:  Beth [name] 05/28/2015, 10:27 PM

## 2015-05-28 NOTE — Progress Notes (Signed)
Patient ID: Beth [name], female   DOB: [birth year], 34 y.o.   MRN: [id number]  Pt currently presents with an anxious affect and labile behavior. Per self inventory, pt rates depression at a 6, hopelessness 3 and anxiety 9. Pt's daily goal is "trying to get my lower back pain and hip pain. I need something stronger than the Ibuprofine 800 and the Torodal 10" and they intend to do so by "meeting with Dr. Lolly Mustache or I can see one of the Dr.'s assistants." Pt reports fair sleep, poor concentration, low energy and a fair appetite. Pt mood is labile today, pt states "I'm anxious about getting to see Dr. Lolly Mustache this morning, I need to see if I can get some type of anti sciotic and stronger pain medicine for my back."  Pt provided with medications per providers orders. Pt's labs and vitals were monitored throughout the day. Pt supported emotionally and encouraged to express concerns and questions. Pt educated on medications. Pt given a 1:1 during emotional agitation. Pt redirectable. Pt given education and handouts on coping with stress and mindfulness.   Pt's safety ensured with 15 minute and environmental checks. Pt currently denies SI/HI and A/V hallucinations. Pt verbally agrees to seek staff if SI/HI or A/VH occurs and to consult with staff before acting on these thoughts. Will continue POC.

## 2015-05-28 NOTE — Plan of Care (Signed)
Problem: Ineffective individual coping Goal: STG: Patient will remain free from self harm Outcome: Progressing Pt remains safe at this time with continuous monitoring

## 2015-05-28 NOTE — Progress Notes (Signed)
Patient ID: Beth [name], female   DOB: [birth year], 34 y.o.   MRN: [id number]  Adult Psychoeducational Group Note  Date:  05/28/2015 Time: 01:00pm   Group Topic/Focus:  Self Care:   The focus of this group is to help patients understand the importance of self-care in order to improve or restore emotional, physical, spiritual, interpersonal, and financial health.  Participation Level:  Active  Participation Quality:  Appropriate and Attentive  Affect:  Flat  Cognitive:  Alert and Oriented  Insight: Improving  Engagement in Group:  Engaged and Improving  Modes of Intervention:  Activity, Discussion, Education and Support  Additional Comments:  Pt was able to identify a personal strength of spiritual self care and intends to utilize "massages" as a tool to better her physical self care.   Aurora Mask 05/28/2015, 3:38 PM

## 2015-05-28 NOTE — [facility] Group Notes (Signed)
[facility] Group Notes: (Clinical Social Work)   05/28/2015      Type of Therapy:  Group Therapy   Participation Level:  Did Not Attend despite MHT prompting   Ambrose Mantle, LCSW 05/28/2015, 1:04 PM

## 2015-05-29 MED ORDER — PRAZOSIN HCL 2 MG PO CAPS
2.0000 mg | ORAL_CAPSULE | Freq: Every day | ORAL | Status: DC
  Administered 2015-05-29: 2 mg via ORAL
  Filled 2015-05-29 (×3): qty 1

## 2015-05-29 MED ORDER — ARIPIPRAZOLE 2 MG PO TABS
3.0000 mg | ORAL_TABLET | Freq: Once | ORAL | Status: AC
  Administered 2015-05-29: 3 mg via ORAL
  Filled 2015-05-29 (×2): qty 2

## 2015-05-29 MED ORDER — ARIPIPRAZOLE 5 MG PO TABS
5.0000 mg | ORAL_TABLET | Freq: Every day | ORAL | Status: DC
  Administered 2015-05-30: 5 mg via ORAL
  Filled 2015-05-29 (×4): qty 1

## 2015-05-29 NOTE — Progress Notes (Signed)
Patient ID: Beth [name], female   DOB: 1981-03-11, 34 y.o.   MRN: [id number]  DAR: Pt. Denies SI/HI and A/V Hallucinations. She reports her sleep last night was poor due to nightmares, appetite is poor, energy level is low, and concentration level is poor. She rates her depression 6/10, hopelessness 5/10, and anxiety 9/10 which she received PRN Ativan for. Support and encouragement provided to the patient. Scheduled and PRN medications administered to patient per physician's orders. Patient is receptive and cooperative. She is seen in the milieu attending groups and interacting with her peers. Q15 minute checks are maintained for safety.

## 2015-05-29 NOTE — [facility] Group Notes (Signed)
[facility] Group Notes:  (Nursing/MHT/Case Management/Adjunct)  Date:  05/29/2015  Time:  0915  Type of Therapy:  Nurse Education  Participation Level:  Active  Participation Quality:  Attentive  Affect:  Anxious  Cognitive:  Alert  Insight:  Improving  Engagement in Group:  Improving  Modes of Intervention:  Discussion, Education and Support  Summary of Progress/Problems: Patient attended and participated in group. Topic included healthy support systems. Patient also participated in progressive relaxation exercise.   Lawrence Marseilles 05/29/2015, 1:26 PM

## 2015-05-29 NOTE — Progress Notes (Signed)
Patient did attend the beginning of the evening speaker AA meeting. Pt left and returned to her room.

## 2015-05-29 NOTE — Plan of Care (Signed)
Problem: Diagnosis: Increased Risk For Suicide Attempt Goal: STG-Patient Will Comply With Medication Regime Outcome: Completed/Met Date Met:  05/29/15 Patient compliant with medication regimen and requests PRN medication

## 2015-05-29 NOTE — Progress Notes (Signed)
[facility] MD Progress Note  05/29/2015 11:51 AM Beth [name]  MRN:  [id number] Subjective: I tried to Abilify but I think they need a higher dose.  It did not phase me out.  I still have nightmares and flashback.  I want to try a higher dose of Minipress.  Objective  Patient seen chart reviewed.  Patient continued to endorse irritability, mood swing and anger issues.  She was given Abilify and she did not report any side effects and liked to go up on the dose.  She still have nightmares and flashback.  She reported taking the sleep she was talking to herself and she may have kick because her roommate told her that she was fighting while in the sleep .  Patient is still have hallucination but they're less intense and less frequent from the past.  She endorse passive suicidal thoughts but no plan.  However her depression is better from the past.  She's attending groups.  She mentioned her mother also endorsed improvement in her mood.  She is hoping to attend mental health outpatient for the management of her psychiatric illness upon discharge.  Principal Problem: Major depressive disorder, recurrent, severe without psychotic features Diagnosis:   Patient Active Problem List   Diagnosis Date Noted  . Panic disorder without agoraphobia with severe panic attacks [F41.0] 05/26/2015  . Major depressive disorder, recurrent episode [F33.9] 05/23/2015  . Suicidal ideations [R45.851]   . Major depressive disorder, recurrent, severe without psychotic features [F33.2]   . Polysubstance dependence [F19.20]   . Substance induced mood disorder [F19.94]   . Substance abuse [F19.10] 03/11/2015  . 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 Alcohol use disorder, moderate, dependence [F10.20] 01/13/2015  . Free intraperitoneal air [K66.8] 08/18/2014  . Perforated ulcer [K27.5] 08/18/2014  . Herniated lumbar disc without myelopathy [M51.26]  04/13/2014  . Acid reflux [K21.9]   . History of blood transfusion [Z92.89]   . Multiple sclerosis [G35] 03/13/2012  . H/O gastric bypass [Z98.84] 03/13/2012   Total Time spent with patient: 30 minutes   Past Medical History:  Past Medical History  Diagnosis Date  . Acid reflux   . Asthma   . History of blood transfusion   . Multiple sclerosis 2005    diagnosed in [place], failed steroids, no other medication  . Anxiety   . Gastric ulcer   . Migraine   . Chronic nausea     self reported  . Chronic diarrhea     self reported  . Chronic pain     self reported chronic pain, back pain, fibromyalgia  . Insomnia   . Polysubstance abuse   . ETOH abuse   . Delirium tremens   . Depression     Past Surgical History  Procedure Laterality Date  . Gastric bypass    . Diagnostic laparoscopy  2003    repair of perforation at [facility] anastamosis   . Lumbar laminectomy/decompression microdiscectomy Right 04/14/2014    Procedure: LUMBAR LAMINECTOMY/DECOMPRESSION MICRODISCECTOMY LEVEL L4-5;  Surgeon: Maeola Harman, MD;  Location: [facility];  Service: Neurosurgery;  Laterality: Right;  LUMBAR LAMINECTOMY/DECOMPRESSION MICRODISCECTOMY LEVEL L4-5  . Laparotomy N/A 08/18/2014    Procedure: EXPLORATORY LAPAROTOMY, Patch graft of gastric perforation;  Surgeon: Abigail Miyamoto, MD;  Location: [facility] OR;  Service: General;  Laterality: N/A;   Family History:  Family History  Problem Relation Age of Onset  . Hypertension Paternal Grandfather   . Colon  cancer Paternal Grandfather 12  . Asthma Paternal Grandmother   . Hypertension Paternal Grandmother   . Breast cancer Paternal Grandmother   . Cancer Paternal Grandmother     lung   . Diabetes Maternal Grandmother   . Diabetes Maternal Grandfather   . Hypertension Father   . Alcohol abuse Father   . Urinary tract infection Mother   . Stroke Mother 4    3 strokes    Social History:  History  Alcohol Use  . 195.6 oz/week  . 25 Cans of beer,  301 Shots of liquor per week    Comment: reports shes a "light drinker" now- drinks 1 pt of vodka and 2 24 oz beers     History  Drug Use  . Yes  . Special: Marijuana, Cocaine, Methamphetamines, Oxycodone, Heroin    Comment: cocaine-none >1 year.  meth-none >10 years.    History   Social History  . Marital Status: Single    Spouse Name: N/A  . Number of Children: N/A  . Years of Education: N/A   Social History Main Topics  . Smoking status: Current Some Day Smoker -- 0.50 packs/day    Types: Cigarettes  . Smokeless tobacco: Never Used  . Alcohol Use: 195.6 oz/week    25 Cans of beer, 301 Shots of liquor per week     Comment: reports shes a "light drinker" now- drinks 1 pt of vodka and 2 24 oz beers  . Drug Use: Yes    Special: Marijuana, Cocaine, Methamphetamines, Oxycodone, Heroin     Comment: cocaine-none >1 year.  meth-none >10 years.  . Sexual Activity: Yes    Birth Control/ Protection: None   Other Topics Concern  . None   Social History Narrative   Additional History:    Sleep: Fair  Appetite:  Fair   Assessment:   Musculoskeletal: Strength & Muscle Tone: within normal limits Gait & Station: normal Patient leans: normal   Psychiatric Specialty Exam: Physical Exam  Review of Systems  Constitutional: Negative.   HENT: Negative.   Eyes: Negative.   Respiratory: Negative.   Cardiovascular: Negative.   Gastrointestinal: Negative.   Genitourinary: Negative.   Musculoskeletal: Positive for back pain.  Skin: Negative.   Neurological: Negative.   Endo/Heme/Allergies: Negative.   Psychiatric/Behavioral: Positive for depression and substance abuse. The patient is nervous/anxious.     Blood pressure 102/71, pulse 101, temperature 97.6 F (36.4 C), temperature source Oral, resp. rate 16, height 5' 5.5" (1.664 m), weight 117.028 kg (258 lb).Body mass index is 42.27 kg/(m^2).  General Appearance: Fairly Groomed  Patent attorney::  Fair  Speech:  Clear and  Coherent  Volume:  Decreased  Mood:  Anxious and Depressed  Affect:  Appropriate  Thought Process:  Coherent and Goal Directed  Orientation:  Full (Time, Place, and Person)  Thought Content:  Hallucinations: Auditory But getting better  Suicidal Thoughts:  No  Homicidal Thoughts:  No  Memory:  Immediate;   Fair Recent;   Fair Remote;   Fair  Judgement:  Fair  Insight:  Present and Shallow  Psychomotor Activity:  Normal  Concentration:  Fair  Recall:  [employer] of Knowledge:Fair  Language: Fair  Akathisia:  No  Handed:  Right  AIMS (if indicated):     Assets:  Desire for Improvement Housing Social Support  ADL's:  Intact  Cognition: WNL  Sleep:  Number of Hours: 6.75     Current Medications: Current Facility-Administered Medications  Medication Dose Route  Frequency Provider Last Rate Last Dose  . acetaminophen (TYLENOL) tablet 650 mg  650 mg Oral Q6H PRN Sanjuana Kava, NP      . alum & mag hydroxide-simeth (MAALOX/MYLANTA) 200-200-20 MG/5ML suspension 30 mL  30 mL Oral Q4H PRN Sanjuana Kava, NP      . ARIPiprazole (ABILIFY) tablet 5 mg  5 mg Oral Daily Cleotis Nipper, MD      . bacitracin ointment   Topical BID Sanjuana Kava, NP      . FLUoxetine (PROZAC) capsule 30 mg  30 mg Oral Daily Rachael Fee, MD   30 mg at 05/29/15 0815  . gabapentin (NEURONTIN) capsule 400 mg  400 mg Oral QID Rachael Fee, MD   400 mg at 05/29/15 1133  . ibuprofen (ADVIL,MOTRIN) tablet 800 mg  800 mg Oral Q6H PRN Rachael Fee, MD   800 mg at 05/29/15 1610  . lamoTRIgine (LAMICTAL) tablet 200 mg  200 mg Oral Daily Cleotis Nipper, MD   200 mg at 05/29/15 0814  . lidocaine (LIDODERM) 5 % 1 patch  1 patch Transdermal Q24H Sanjuana Kava, NP   1 patch at 05/28/15 1408  . loratadine (CLARITIN) tablet 10 mg  10 mg Oral Daily Rachael Fee, MD   10 mg at 05/29/15 0815  . LORazepam (ATIVAN) tablet 1 mg  1 mg Oral TID PRN Rachael Fee, MD   1 mg at 05/29/15 9604  . magnesium hydroxide (MILK OF MAGNESIA)  suspension 30 mL  30 mL Oral Daily PRN Sanjuana Kava, NP      . methocarbamol (ROBAXIN) tablet 500 mg  500 mg Oral QID Rachael Fee, MD   500 mg at 05/29/15 1133  . neomycin-bacitracin-polymyxin (NEOSPORIN) ointment   Topical BID Jomarie Longs, MD      . nicotine polacrilex (NICORETTE) gum 2 mg  2 mg Oral PRN Rachael Fee, MD   2 mg at 05/28/15 1948  . pantoprazole (PROTONIX) EC tablet 20 mg  20 mg Oral Daily Rachael Fee, MD   20 mg at 05/29/15 0815  . prazosin (MINIPRESS) capsule 2 mg  2 mg Oral QHS Cleotis Nipper, MD      . SUMAtriptan (IMITREX) tablet 50 mg  50 mg Oral BID PRN Rachael Fee, MD   50 mg at 05/29/15 0820  . traZODone (DESYREL) tablet 200 mg  200 mg Oral QHS PRN Rachael Fee, MD   200 mg at 05/28/15 2123    Lab Results: No results found for this or any previous visit (from the past 48 hour(s)).  Physical Findings: AIMS: Facial and Oral Movements Muscles of Facial Expression: None, normal Lips and Perioral Area: None, normal Jaw: None, normal Tongue: None, normal,Extremity Movements Upper (arms, wrists, hands, fingers): None, normal Lower (legs, knees, ankles, toes): None, normal, Trunk Movements Neck, shoulders, hips: None, normal, Overall Severity Severity of abnormal movements (highest score from questions above): None, normal Incapacitation due to abnormal movements: None, normal Patient's awareness of abnormal movements (rate only patient's report): No Awareness, Dental Status Current problems with teeth and/or dentures?: No Does patient usually wear dentures?: No  CIWA:  CIWA-Ar Total: 1 COWS:     Treatment Plan Summary: Daily contact with patient to assess and evaluate symptoms and progress in treatment and Medication management Supportive approach/coping skills Alcohol abuse; continue to work a relapse prevention plan Depression; I will increase Abilify 5 mg to help with residual depression  and psychosis.  Continue Prozac 30 mg daily.  Patient does not  have any side effects or any tremors.  I will also increase Minipress 2 mg to help the nightmares and flashback.  Explain postural hypotension and dizziness with the medication. Mood instability; continue the Lamictal 200 mg daily Anxiety; will continue the Neurontin and the Ativan PRN Back pain; will add motrin 800 mg Q 6 PRN anxiety CBT/mindfulness Discussed medication side effects and benefits.  At this time she does not have any tremors shakes or any EPS.  Medical Decision Making:  Review of Psycho-Social Stressors (1), Review or order clinical lab tests (1), Established Problem, Worsening (2), Review of Last Therapy Session (1), Review of Medication Regimen & Side Effects (2) and Review of New Medication or Change in Dosage (2)     Analyn Matusek T. 05/29/2015, 11:51 AM

## 2015-05-29 NOTE — [facility] Group Notes (Signed)
[facility] Group Notes:  (Clinical Social Work)  05/29/2015  10:00-11:00AM  Summary of Progress/Problems:   The main focus of today's process group was to   1)  discuss the importance of adding supports  2)  define health supports versus unhealthy supports  3)  identify the patient's current unhealthy supports and plan how to handle them  4)  Identify the patient's current healthy supports and plan what to add.  An emphasis was placed on using counselor, doctor, therapy groups, 12-step groups, and problem-specific support groups to expand supports.    The patient expressed full comprehension of the concepts presented, and agreed that there is a need to add more supports.  The patient initially refused to attend group, remained in the day room playing games and talking until CSW insisted.  She came and was positive, enthusiastic and participatory for awhile, then gathered all her possessions and left without explanation.  Type of Therapy:  Process Group with Motivational Interviewing  Participation Level:  Active  Participation Quality:  Attentive and Resistant  Affect:  Blunted  Cognitive:  Alert  Insight:  Limited  Engagement in Therapy:  Limited  Modes of Intervention:   Education, Support and Processing, Activity  Ambrose Mantle, LCSW 05/29/2015

## 2015-05-30 ENCOUNTER — Other Ambulatory Visit (HOSPITAL_COMMUNITY): Payer: BLUE CROSS/BLUE SHIELD

## 2015-05-30 MED ORDER — ARIPIPRAZOLE 5 MG PO TABS: 5.0000 mg | ORAL_TABLET | Freq: Every day | ORAL | Status: DC

## 2015-05-30 MED ORDER — LORAZEPAM 1 MG PO TABS: 1.0000 mg | ORAL_TABLET | Freq: Three times a day (TID) | ORAL | Status: DC | PRN

## 2015-05-30 MED ORDER — PRAZOSIN HCL 2 MG PO CAPS: 2.0000 mg | ORAL_CAPSULE | Freq: Every day | ORAL | Status: DC

## 2015-05-30 MED ORDER — LAMOTRIGINE 200 MG PO TABS: 200.0000 mg | ORAL_TABLET | Freq: Every day | ORAL | Status: DC

## 2015-05-30 MED ORDER — METHOCARBAMOL 500 MG PO TABS: 500.0000 mg | ORAL_TABLET | Freq: Four times a day (QID) | ORAL | Status: DC

## 2015-05-30 MED ORDER — GABAPENTIN 400 MG PO CAPS: 400.0000 mg | ORAL_CAPSULE | Freq: Four times a day (QID) | ORAL | Status: DC

## 2015-05-30 MED ORDER — OMEPRAZOLE 20 MG PO CPDR: 20.0000 mg | DELAYED_RELEASE_CAPSULE | Freq: Every day | ORAL | Status: DC

## 2015-05-30 MED ORDER — NICOTINE POLACRILEX 2 MG MT GUM: 2.0000 mg | CHEWING_GUM | OROMUCOSAL | Status: DC | PRN

## 2015-05-30 MED ORDER — FLUOXETINE HCL 10 MG PO CAPS
30.0000 mg | ORAL_CAPSULE | Freq: Every day | ORAL | Status: DC
  Filled 2015-05-30: qty 9

## 2015-05-30 MED ORDER — FLUOXETINE HCL 10 MG PO CAPS: 30.0000 mg | ORAL_CAPSULE | Freq: Every day | ORAL | Status: DC

## 2015-05-30 MED ORDER — LIDOCAINE 5 % EX PTCH: 1.0000 | MEDICATED_PATCH | CUTANEOUS | Status: DC

## 2015-05-30 MED ORDER — TRAZODONE HCL 100 MG PO TABS: 200.0000 mg | ORAL_TABLET | Freq: Every evening | ORAL | Status: DC | PRN

## 2015-05-30 MED ORDER — SUMATRIPTAN SUCCINATE 50 MG PO TABS: 50.0000 mg | ORAL_TABLET | ORAL | Status: DC | PRN

## 2015-05-30 MED ORDER — LORATADINE 10 MG PO TABS: 10.0000 mg | ORAL_TABLET | Freq: Every day | ORAL | Status: DC
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
## 2015-05-30 NOTE — Discharge Summary (Signed)
Physician Discharge Summary Note  Patient:  Beth [name] is an 34 y.o., female MRN:  [id number] DOB:  04-Jul-1981 Patient phone:  [phone] (home)  Patient address:   [address] [place] [address],  Total Time spent with patient: Greater than 30 minutes  Date of Admission:  05/23/2015 Date of Discharge: 05-30-15   Reason for Admission: Worsening symptoms of depression  Principal Problem: Major depressive disorder, recurrent, severe without psychotic features Discharge Diagnoses: Patient Active Problem List   Diagnosis Date Noted  . Panic disorder without agoraphobia with severe panic attacks [F41.0] 05/26/2015  . Major depressive disorder, recurrent episode [F33.9] 05/23/2015  . Suicidal ideations [R45.851]   . Major depressive disorder, recurrent, severe without psychotic features [F33.2]   . Polysubstance dependence [F19.20]   . Substance induced mood disorder [F19.94]   . Substance abuse [F19.10] 03/11/2015  . 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 Alcohol use disorder, moderate, dependence [F10.20] 01/13/2015  . Free intraperitoneal air [K66.8] 08/18/2014  . Perforated ulcer [K27.5] 08/18/2014  . 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 Acid reflux [K21.9]   . History of blood transfusion [Z92.89]   . Multiple sclerosis [G35] 03/13/2012  . H/O gastric bypass [Z98.84] 03/13/2012   Musculoskeletal: Strength & Muscle Tone: within normal limits Gait & Station: normal Patient leans: N/A  Psychiatric Specialty Exam: Physical Exam  Psychiatric: Her speech is normal and behavior is normal. Judgment and thought content normal. Her mood appears not anxious. Her affect is not angry, not blunt, not labile and not inappropriate. Cognition and memory are normal. She does not exhibit a depressed mood.    Review of Systems  Constitutional: Negative.   HENT:  Negative.   Eyes: Negative.   Respiratory: Negative.   Cardiovascular: Negative.   Gastrointestinal: Negative.   Genitourinary: Negative.   Musculoskeletal: Negative.   Skin: Negative.   Endo/Heme/Allergies: Negative.   Psychiatric/Behavioral: Positive for depression (Stable) and substance abuse (Alcoholism, chronic). Negative for suicidal ideas, hallucinations and memory loss. The patient has insomnia (Stable). The patient is not nervous/anxious.     Blood pressure 106/59, pulse 104, temperature 97.9 F (36.6 C), temperature source Oral, resp. rate 16, height 5' 5.5" (1.664 m), weight 117.028 kg (258 lb).Body mass index is 42.27 kg/(m^2).  See Md's SRA   Have you used any form of tobacco in the last 30 days? (Cigarettes, Smokeless Tobacco, Cigars, and/or Pipes): Yes  Has this patient used any form of tobacco in the last 30 days? (Cigarettes, Smokeless Tobacco, Cigars, and/or Pipes): Yes, prescription & samples for nicotine gum provided.  Past Medical History:  Past Medical History  Diagnosis Date  . Acid reflux   . Asthma   . History of blood transfusion   . Multiple sclerosis 2005    diagnosed in [place], failed steroids, no other medication  . Anxiety   . Gastric ulcer   . Migraine   . Chronic nausea     self reported  . Chronic diarrhea     self reported  . Chronic pain     self reported chronic pain, back pain, fibromyalgia  . Insomnia   . Polysubstance abuse   . ETOH abuse   . Delirium tremens   . Depression     Past Surgical History  Procedure Laterality Date  . Gastric bypass    . Diagnostic laparoscopy  2003    repair of perforation at [facility] anastamosis   .  Lumbar laminectomy/decompression microdiscectomy Right 04/14/2014    Procedure: LUMBAR LAMINECTOMY/DECOMPRESSION MICRODISCECTOMY LEVEL L4-5;  Surgeon: Maeola Harman, MD;  Location: [facility];  Service: Neurosurgery;  Laterality: Right;  LUMBAR LAMINECTOMY/DECOMPRESSION MICRODISCECTOMY LEVEL L4-5  . Laparotomy  N/A 08/18/2014    Procedure: EXPLORATORY LAPAROTOMY, Patch graft of gastric perforation;  Surgeon: Abigail Miyamoto, MD;  Location: [facility] OR;  Service: General;  Laterality: N/A;   Family History:  Family History  Problem Relation Age of Onset  . Hypertension Paternal Grandfather   . Colon cancer Paternal Grandfather 68  . Asthma Paternal Grandmother   . Hypertension Paternal Grandmother   . Breast cancer Paternal Grandmother   . Cancer Paternal Grandmother     lung   . Diabetes Maternal Grandmother   . Diabetes Maternal Grandfather   . Hypertension Father   . Alcohol abuse Father   . Urinary tract infection Mother   . Stroke Mother 77    3 strokes    Social History:  History  Alcohol Use  . 195.6 oz/week  . 25 Cans of beer, 301 Shots of liquor per week    Comment: reports shes a "light drinker" now- drinks 1 pt of vodka and 2 24 oz beers     History  Drug Use  . Yes  . Special: Marijuana, Cocaine, Methamphetamines, Oxycodone, Heroin    Comment: cocaine-none >1 year.  meth-none >10 years.    History   Social History  . Marital Status: Single    Spouse Name: N/A  . Number of Children: N/A  . Years of Education: N/A   Social History Main Topics  . Smoking status: Current Some Day Smoker -- 0.50 packs/day    Types: Cigarettes  . Smokeless tobacco: Never Used  . Alcohol Use: 195.6 oz/week    25 Cans of beer, 301 Shots of liquor per week     Comment: reports shes a "light drinker" now- drinks 1 pt of vodka and 2 24 oz beers  . Drug Use: Yes    Special: Marijuana, Cocaine, Methamphetamines, Oxycodone, Heroin     Comment: cocaine-none >1 year.  meth-none >10 years.  . Sexual Activity: Yes    Birth Control/ Protection: None   Other Topics Concern  . None   Social History Narrative   Risk to Self: Suicidal Ideation: No-Not Currently/Within Last 6 Months Suicidal Intent: No Is patient at risk for suicide?: No Suicidal Plan?: No Access to Means: No What has been  your use of drugs/alcohol within the last 12 months?: Was in recovery after tx at [name], relapsed on heroin recently in attempt to escape disturbing thoughts Intentional Self Injurious Behavior: None  Risk to Others: Homicidal Ideation: No Thoughts of Harm to Others: No Current Homicidal Intent: No Current Homicidal Plan: No Access to Homicidal Means: No History of harm to others?: No Assessment of Violence: None Noted Does patient have access to weapons?: No Criminal Charges Pending?: No Does patient have a court date: No Prior Inpatient Therapy: Prior Inpatient Therapy: Yes Prior Therapy Dates: may 2016  Prior Therapy Facilty[facility]Provider(s): [facility] Reason for Treatment: depression, anxiety Prior Outpatient Therapy: Prior Outpatient Therapy: Yes Prior Therapy Dates: current Prior Therapy Facilty/Provider(s): ongoing Reason for Treatment: anxiety Does patient have an ACCT team?: No Does patient have Intensive In-House Services?  : No Does patient have [facility] services? : No Does patient have P4CC services?: Yes  Level of Care:  OP  Hospital Course:  Edrie is a 34 year old obese Caucasian  female. Has been a patient in this hospital x numerous times. She was here in March, May of this year & recently was a patient/discharged from the [facility]. Tawny has hx of polysubstance abuse issues including alcoholism, cocaine, Benzodiazepine, also Major depressive disorder. This time around, she reports, "I went to my very first appointment with my psychiatrist Dr. Lolly Mustache after getting discharged from the old [facility] He sent me here to have my medicines regulated. When I was discharged from this hospital in May of this year, Dr. Dub Mikes sent me home on some medication regimen. I was doing well on them till around 04-20-15 thru 04-23-15 when things started going down again for me. I started to crave alcohol so bad that I will cry if I don't get it. I started  drinking again. I was drinking up to 2 bottles of the 40s & a pint of Vodka daily. Then, I went to the [facility]. They did not do much for me, rather messed up my medicines. Now, I need my medicines re-regulated because they are not working as they are right now. I have daily mood swings, I cry a lot, feeling very depressed. I'm not sleeping at night. I have bad anxiety. I have nightmares about my sister's funeral. She committed suicide & I can't seem to get over her death. 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 But, I promise you that you are talking to me, the real [place]. There is something about the 22nd of the months that does not agree with my mood".  Pami was re-admitted to the adult unit for medication re-regulation. She stated tat she just was discharged from the [facility] & her medications were messed up. As a result, her depressive symptoms re-surfaced & are worsening. During her admission assessment, she was evaluated and her symptoms identified. Her admitting medication regimen were reviewed & adjusted targeting her presenting symptoms. She was oriented to the unit and encouraged to participate in the unit programming. Her other presenting medical symptoms were identified and treated appropriately (migraine headaches & muscle pains). She tolerated her treatment regimen without any significant adverse effects and or reactions.  During [facility] hospital stay, she was evaluated daily by the providers Dr. Lolly Mustache & Dr. Dub Mikes to assure her response to her treatment regimen. As the day goes by, improvement was noted by her reports of decreasing symptoms, improved sleep, medication tolerance, presentation of appropriate behavior & participation in the unit programming. She was required on daily basis to complete a self inventory asssessment noting mood, mental status, pain, any new symptoms, anxiety and or concerns. Her symptoms responded well to her  adjusted regimen. Also, being in a therapeutic and supportive environment assisted in her mood stability. Delanda was some what motivated for recovery. She did work closely with the treatment team and case manager to develop a discharge plan with appropriate goals to maintain mood stability after discharge. Coping skills, problem solving as well as relaxation therapies were also part of her unit programming.  On this day of her hospital discharge, makyah is in much improved condition than upon admission. Her symptoms were reported as significantly decreased or resolved completely. Upon discharge, she adamantly denies any SI/HI & voiced no AVH. She was motivated to continue taking medication with a goal of continued improvement in mental health. Dajanique was discharged home with a plan to follow up with Dr, Lolly Mustache at the [facility]. She was medicated &  discharged on; Lamictal 200 mg for mood stabilization, Abilify 5 mg for mood control, Gabapentin 400 mg for agitation, prazosin 2 mg for nightmares, Nicorette gum for smoking cessation, Prozac 30 mg for depression, Lorazepam 1 mg for severe anxiety & Trazodone 200 mg. Dot Lanes received from the [facility] pharmacy, a 4 days worth, supply samples of her [facility] discharge medications. She left [facility] with all personal belongings in no apparent distress. Transportation per patient arrangement.  Consults:  psychiatry  Significant Diagnostic Studies:  labs: CBC with diff, CMp, UDS, toxicology tests, U/A, reports reviewed, stable  Discharge Vitals:   Blood pressure 106/59, pulse 104, temperature 97.9 F (36.6 C), temperature source Oral, resp. rate 16, height 5' 5.5" (1.664 m), weight 117.028 kg (258 lb). Body mass index is 42.27 kg/(m^2). Lab Results:   No results found for this or any previous visit (from the past 72 hour(s)).  Physical Findings: AIMS: Facial and Oral Movements Muscles of Facial Expression: None, normal Lips and Perioral Area: None, normal Jaw:  None, normal Tongue: None, normal,Extremity Movements Upper (arms, wrists, hands, fingers): None, normal Lower (legs, knees, ankles, toes): None, normal, Trunk Movements Neck, shoulders, hips: None, normal, Overall Severity Severity of abnormal movements (highest score from questions above): None, normal Incapacitation due to abnormal movements: None, normal Patient's awareness of abnormal movements (rate only patient's report): No Awareness, Dental Status Current problems with teeth and/or dentures?: No Does patient usually wear dentures?: No  CIWA:  CIWA-Ar Total: 1 COWS:      See Psychiatric Specialty Exam and Suicide Risk Assessment completed by Attending Physician prior to discharge.  Discharge destination:  Home  Is patient on multiple antipsychotic therapies at discharge:  No   Has Patient had three or more failed trials of antipsychotic monotherapy by history:  No  Recommended Plan for Multiple Antipsychotic Therapies: NA    Medication List    STOP taking these medications        acamprosate 333 MG tablet  Commonly known as:  CAMPRAL     ferrous sulfate 325 (65 FE) MG tablet     gabapentin 600 MG tablet  Commonly known as:  NEURONTIN  Replaced by:  gabapentin 400 MG capsule     hydrOXYzine 25 MG tablet  Commonly known as:  ATARAX/VISTARIL     hydrOXYzine 50 MG capsule  Commonly known as:  VISTARIL     MULTIVITAMIN & MINERAL PO     QUEtiapine 100 MG tablet  Commonly known as:  SEROQUEL     QUEtiapine 300 MG tablet  Commonly known as:  SEROQUEL      TAKE these medications      Indication   ARIPiprazole 5 MG tablet  Commonly known as:  ABILIFY  Take 1 tablet (5 mg total) by mouth daily. For depression/mood control   Indication:  Major Depressive Disorder, Mood control     FLUoxetine 10 MG capsule  Commonly known as:  PROZAC  Take 3 capsules (30 mg total) by mouth daily. For depression   Indication:  Major Depressive Disorder     gabapentin 400 MG  capsule  Commonly known as:  NEURONTIN  Take 1 capsule (400 mg total) by mouth 4 (four) times daily. For agitation   Indication:  Agitation     lamoTRIgine 200 MG tablet  Commonly known as:  LAMICTAL  Take 1 tablet (200 mg total) by mouth daily. For mood stabilization   Indication:  Mood stabilization     lidocaine 5 %  Commonly known  as:  LIDODERM  Place 1 patch onto the skin daily. Remove & Discard patch within 12 hours or as directed by MD: For pain managment   Indication:  Pain management     loratadine 10 MG tablet  Commonly known as:  CLARITIN  Take 1 tablet (10 mg total) by mouth daily. (May purchase from over the counter at yr local pharmacy): For allergies   Indication:  Perennial Rhinitis, Hayfever     LORazepam 1 MG tablet  Commonly known as:  ATIVAN  Take 1 tablet (1 mg total) by mouth 3 (three) times daily as needed for anxiety.   Indication:  Anxiety     methocarbamol 500 MG tablet  Commonly known as:  ROBAXIN  Take 1 tablet (500 mg total) by mouth 4 (four) times daily. For muscle pain   Indication:  Musculoskeletal Pain     nicotine polacrilex 2 MG gum  Commonly known as:  NICORETTE  Take 1 each (2 mg total) by mouth as needed for smoking cessation.   Indication:  Nicotine Addiction     omeprazole 20 MG capsule  Commonly known as:  PRILOSEC  Take 1 capsule (20 mg total) by mouth daily. For acid reflux   Indication:  Gastroesophageal Reflux Disease     prazosin 2 MG capsule  Commonly known as:  MINIPRESS  Take 1 capsule (2 mg total) by mouth at bedtime. For nightmares   Indication:  Nightmares     SUMAtriptan 50 MG tablet  Commonly known as:  IMITREX  Take 1 tablet (50 mg total) by mouth every 2 (two) hours as needed for migraine or headache.   Indication:  Headache, Migraine Headache     traZODone 100 MG tablet  Commonly known as:  DESYREL  Take 2 tablets (200 mg total) by mouth at bedtime as needed for sleep.   Indication:  Trouble Sleeping        Follow-up Information    Follow up with [facility]. Go on 06/06/2015.   Why:  Hospital discharge follow up appt on 06/06/15 at 10 AM with Dr. Lolly Mustache. Please call to cancel or reschedule if necessary.    Contact information:   [address] [place], [place]  [address] Phone:  [phone]     Follow-up recommendations: Activity:  As tolerated Diet: As recommended by your primary care doctor. Keep all scheduled follow-up appointments as recommended.   Comments: Take all your medications as prescribed by your mental healthcare provider. Report any adverse effects and or reactions from your medicines to your outpatient provider promptly. Patient is instructed and cautioned to not engage in alcohol and or illegal drug use while on prescription medicines. In the event of worsening symptoms, patient is instructed to call the crisis hotline, 911 and or go to the nearest ED for appropriate evaluation and treatment of symptoms. Follow-up with your primary care provider for your other medical issues, concerns and or health care needs.   Total Discharge Time: Greater than 30 minutes  Signed: Sanjuana Kava, PMHNP-BC 05/30/2015, 4:18 PM  I personally assessed the patient and formulated the plan Madie Reno A. Dub Mikes, M.D.

## 2015-05-30 NOTE — Progress Notes (Signed)
Recreation Therapy Notes  Date: 08.01.16 Time: 9:30 am Location: [address] Group Room  Group Topic: Stress Management  Goal Area(s) Addresses:  Patient will verbalize importance of using healthy stress management.  Patient will identify positive emotions associated with healthy stress management.   Behavioral Response: Engaged  Intervention: Stress Management  Activity :  Guided Imagery Script.  LRT introduced and educated patients on the stress management technique of guided imagery script.  A script was read and patients were asked to follow a long as the LRT read the script a loud to engaged in the technique of guided imagery.  Education:  Stress Management, Discharge Planning.   Education Outcome: Acknowledges edcuation/In group clarification offered/Needs additional education  Clinical Observations/Feedback: Patient attended group.    Kadarrius Yanke, LRT/CTRS   Chandra Asher A 05/30/2015 1:12 PM 

## 2015-05-30 NOTE — Progress Notes (Signed)
D) pt. Was d/c to care of mother.  Pt denied SI/HI and denied A/V hallucinations.  Pt. Denied pain. Affect and mood appropriate for d/c. A) AVS reviewed, medications reviewed, prescriptions provided. Follow up plans reviewed.  Belongings returned. Safety plan reviewed.  R) pt. Receptive and indicated understanding of follow up care and medications. Escorted to lobby.

## 2015-05-30 NOTE — Progress Notes (Signed)
D Pt. Denies SI and HI, does complaints of a headache and anxiety.  A Writer offered support and encouragement, Adm. Anxiety medication and Imitrex for pt.'s headache.  R Pt. Reports reduced anxiety and headache.  Pt. Remains safe on the unit. And is presently resting quietly.  Pt. Rates her day a 7 out of 10 and her anxiety a 6 and depression a 7.

## 2015-05-30 NOTE — BHH Suicide Risk Assessment (Signed)
[facility] Discharge Suicide Risk Assessment   Demographic Factors:  Caucasian  Total Time spent with patient: 30 minutes  Musculoskeletal: Strength & Muscle Tone: within normal limits Gait & Station: normal Patient leans: normal  Psychiatric Specialty Exam: Physical Exam  Review of Systems  Constitutional: Negative.   HENT: Negative.   Eyes: Negative.   Respiratory: Negative.   Cardiovascular: Negative.   Gastrointestinal: Negative.   Genitourinary: Negative.   Musculoskeletal: Positive for back pain.  Skin: Negative.   Neurological: Negative.   Endo/Heme/Allergies: Negative.   Psychiatric/Behavioral: Positive for depression.    Blood pressure 106/59, pulse 104, temperature 97.9 F (36.6 C), temperature source Oral, resp. rate 16, height 5' 5.5" (1.664 m), weight 117.028 kg (258 lb).Body mass index is 42.27 kg/(m^2).  General Appearance: Fairly Groomed  Patent attorney::  Fair  Speech:  Clear and Coherent409  Volume:  Normal  Mood:  mostly OK  Affect:  Appropriate  Thought Process:  Coherent and Goal Directed  Orientation:  Full (Time, Place, and Person)  Thought Content:  plans as she moves on  Suicidal Thoughts:  No  Homicidal Thoughts:  No  Memory:  Immediate;   Fair Recent;   Fair Remote;   Fair  Judgement:  Fair  Insight:  Present  Psychomotor Activity:  Normal  Concentration:  Fair  Recall:  [employer] of Knowledge:Fair  Language: Fair  Akathisia:  No  Handed:  Right  AIMS (if indicated):     Assets:  Desire for Improvement Housing Social Support  Sleep:  Number of Hours: 6.75  Cognition: WNL  ADL's:  Intact   Have you used any form of tobacco in the last 30 days? (Cigarettes, Smokeless Tobacco, Cigars, and/or Pipes): Yes  Has this patient used any form of tobacco in the last 30 days? (Cigarettes, Smokeless Tobacco, Cigars, and/or Pipes) Yes, Prescription not provided because: nicotine patches given  Mental Status Per Nursing Assessment::   On Admission:   NA  Current Mental Status by Physician: In full contact with reality. There are no active SI plans or intent. She is going to continue to work on her issues on an outpatient basis. Plans to come to the [facility]    Loss Factors: NA  Historical Factors: NA  Risk Reduction Factors:   Sense of responsibility to family, Living with another person, especially a relative and Positive social support  Continued Clinical Symptoms:  Depression:   Comorbid alcohol abuse/dependence  Cognitive Features That Contribute To Risk:  None    Suicide Risk:  Minimal: No identifiable suicidal ideation.  Patients presenting with no risk factors but with morbid ruminations; may be classified as minimal risk based on the severity of the depressive symptoms  Principal Problem: Major depressive disorder, recurrent, severe without psychotic features Discharge Diagnoses:  Patient Active Problem List   Diagnosis Date Noted  . Panic disorder without agoraphobia with severe panic attacks [F41.0] 05/26/2015    Priority: High  . 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 Polysubstance dependence [F19.20]     Priority: High  . Alcohol use disorder, moderate, dependence [F10.20] 01/13/2015    Priority: High  . Major depressive disorder, recurrent episode [F33.9] 05/23/2015  . Suicidal ideations [R45.851]   . Substance induced mood disorder [F19.94]   . Substance abuse [F19.10] 03/11/2015  . 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 Free intraperitoneal  air [K66.8] 08/18/2014  . Perforated ulcer [K27.5] 08/18/2014  . 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 Acid reflux [K21.9]   . History of blood transfusion [Z92.89]   . Multiple sclerosis [G35] 03/13/2012  . H/O gastric bypass [Z98.84] 03/13/2012     Follow-up Information    Follow up with [facility]. Go on 06/06/2015.   Why:  Hospital discharge follow up appt on 06/06/15 at 10 AM with Dr. Lolly Mustache. Please call to cancel or reschedule if necessary.    Contact information:   [address] [place], [place]  [address] Phone:  [phone]      Plan Of Care/Follow-up recommendations:  Activity:  as tolerated Diet:  regular Follow up [facility] Is patient on multiple antipsychotic therapies at discharge:  No   Has Patient had three or more failed trials of antipsychotic monotherapy by history:  No  Recommended Plan for Multiple Antipsychotic Therapies: NA    Johnhenry Tippin A 05/30/2015, 5:16 PM

## 2015-05-31 NOTE — Progress Notes (Signed)
  [facility] Adult Case Management Discharge Plan :  Will you be returning to the same living situation after discharge:  Yes,  Patient returned home At discharge, do you have transportation home?: Yes,  patient arranged transportation Do you have the ability to pay for your medications: Yes,  patient provided with prescriptions  Release of information consent forms completed and in the chart;  Patient's signature needed at discharge.  Patient to Follow up at: Follow-up Information    Follow up with [facility]. Go on 06/06/2015.   Why:  Hospital discharge follow up appt on 06/06/15 at 10 AM with Dr. Lolly Mustache. Please call to cancel or reschedule if necessary.    Contact information:   [address] [place], [place]  [address] Phone:  [phone]      Patient denies SI/HI: Yes,  denies    Safety Planning and Suicide Prevention discussed: Yes,  with patient and mother  Have you used any form of tobacco in the last 30 days? (Cigarettes, Smokeless Tobacco, Cigars, and/or Pipes): Yes  Has patient been referred to the Quitline?: Yes, faxed on 05/31/15  Elia Keenum, [place] 05/31/2015, 8:29 AM

## 2015-06-01 ENCOUNTER — Other Ambulatory Visit (HOSPITAL_COMMUNITY): Payer: BLUE CROSS/BLUE SHIELD

## 2015-06-03 ENCOUNTER — Other Ambulatory Visit (HOSPITAL_COMMUNITY): Payer: BLUE CROSS/BLUE SHIELD

## 2015-06-06 ENCOUNTER — Other Ambulatory Visit (HOSPITAL_COMMUNITY): Payer: BLUE CROSS/BLUE SHIELD

## 2015-06-06 ENCOUNTER — Ambulatory Visit (INDEPENDENT_AMBULATORY_CARE_PROVIDER_SITE_OTHER): Payer: BLUE CROSS/BLUE SHIELD | Admitting: Psychiatry

## 2015-06-06 ENCOUNTER — Encounter (HOSPITAL_COMMUNITY): Payer: Self-pay | Admitting: Psychiatry

## 2015-06-06 VITALS — BP 110/60 | HR 75 | Ht 66.0 in | Wt 263.4 lb

## 2015-06-06 DIAGNOSIS — F332 Major depressive disorder, recurrent severe without psychotic features: Secondary | ICD-10-CM

## 2015-06-06 DIAGNOSIS — F1020 Alcohol dependence, uncomplicated: Secondary | ICD-10-CM | POA: Diagnosis not present

## 2015-06-06 DIAGNOSIS — F4310 Post-traumatic stress disorder, unspecified: Secondary | ICD-10-CM | POA: Diagnosis not present

## 2015-06-06 DIAGNOSIS — F315 Bipolar disorder, current episode depressed, severe, with psychotic features: Secondary | ICD-10-CM | POA: Diagnosis not present

## 2015-06-06 DIAGNOSIS — F1120 Opioid dependence, uncomplicated: Secondary | ICD-10-CM

## 2015-06-06 MED ORDER — GABAPENTIN 400 MG PO CAPS: 400.0000 mg | ORAL_CAPSULE | Freq: Four times a day (QID) | ORAL | Status: DC

## 2015-06-06 MED ORDER — LAMOTRIGINE 200 MG PO TABS: 200.0000 mg | ORAL_TABLET | Freq: Every day | ORAL | Status: DC

## 2015-06-06 MED ORDER — LORAZEPAM 1 MG PO TABS: 1.0000 mg | ORAL_TABLET | Freq: Every day | ORAL | Status: DC | PRN

## 2015-06-06 MED ORDER — PRAZOSIN HCL 2 MG PO CAPS: 2.0000 mg | ORAL_CAPSULE | Freq: Every day | ORAL | Status: DC

## 2015-06-06 MED ORDER — SUVOREXANT 15 MG PO TABS: 15.0000 mg | ORAL_TABLET | Freq: Every day | ORAL | Status: DC

## 2015-06-06 MED ORDER — ARIPIPRAZOLE 5 MG PO TABS: 5.0000 mg | ORAL_TABLET | Freq: Every day | ORAL | Status: DC

## 2015-06-06 MED ORDER — FLUOXETINE HCL 40 MG PO CAPS: 40.0000 mg | ORAL_CAPSULE | Freq: Every day | ORAL | Status: DC
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
## 2015-06-07 ENCOUNTER — Other Ambulatory Visit (HOSPITAL_COMMUNITY): Payer: BLUE CROSS/BLUE SHIELD | Admitting: Psychiatry

## 2015-06-07 ENCOUNTER — Encounter (HOSPITAL_COMMUNITY): Payer: Self-pay | Admitting: Psychiatry

## 2015-06-07 DIAGNOSIS — F332 Major depressive disorder, recurrent severe without psychotic features: Secondary | ICD-10-CM | POA: Insufficient documentation

## 2015-06-07 DIAGNOSIS — F17210 Nicotine dependence, cigarettes, uncomplicated: Secondary | ICD-10-CM | POA: Insufficient documentation

## 2015-06-07 DIAGNOSIS — F909 Attention-deficit hyperactivity disorder, unspecified type: Secondary | ICD-10-CM | POA: Diagnosis not present

## 2015-06-07 DIAGNOSIS — F411 Generalized anxiety disorder: Secondary | ICD-10-CM

## 2015-06-07 DIAGNOSIS — F410 Panic disorder [episodic paroxysmal anxiety] without agoraphobia: Secondary | ICD-10-CM | POA: Insufficient documentation

## 2015-06-07 DIAGNOSIS — G4700 Insomnia, unspecified: Secondary | ICD-10-CM | POA: Insufficient documentation

## 2015-06-07 DIAGNOSIS — Z9884 Bariatric surgery status: Secondary | ICD-10-CM | POA: Diagnosis not present

## 2015-06-07 DIAGNOSIS — G35 Multiple sclerosis: Secondary | ICD-10-CM | POA: Diagnosis not present
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
## 2015-06-08 ENCOUNTER — Other Ambulatory Visit (HOSPITAL_COMMUNITY): Payer: BLUE CROSS/BLUE SHIELD | Admitting: Psychiatry

## 2015-06-08 ENCOUNTER — Other Ambulatory Visit (HOSPITAL_COMMUNITY): Payer: BLUE CROSS/BLUE SHIELD

## 2015-06-08 DIAGNOSIS — F411 Generalized anxiety disorder: Secondary | ICD-10-CM

## 2015-06-08 DIAGNOSIS — F332 Major depressive disorder, recurrent severe without psychotic features: Secondary | ICD-10-CM | POA: Diagnosis not present

## 2015-06-08 NOTE — Progress Notes (Signed)
    Daily Group Progress Note  Program: IOP  Group Time: 9:00-10:30  Participation Level: Active  Behavioral Response: Appropriate  Type of Therapy:  Group Therapy  Summary of Progress: Pt. Met with psychiatrist and case manager.      Group Time: 10:30-12:00  Participation Level:  Active  Behavioral Response: Appropriate  Type of Therapy: Psycho-education Group  Summary of Progress: Pt. Watched and discussed [employer] video about using vulnerability to develop resilience to shame and patterns of numbing that stall the recovery process.   Nancie Neas, LPC

## 2015-06-09 ENCOUNTER — Telehealth (HOSPITAL_COMMUNITY): Payer: Self-pay

## 2015-06-09 ENCOUNTER — Other Ambulatory Visit (HOSPITAL_COMMUNITY): Payer: BLUE CROSS/BLUE SHIELD | Attending: Psychiatry | Admitting: Psychiatry

## 2015-06-09 DIAGNOSIS — F332 Major depressive disorder, recurrent severe without psychotic features: Secondary | ICD-10-CM | POA: Diagnosis not present

## 2015-06-09 DIAGNOSIS — F411 Generalized anxiety disorder: Secondary | ICD-10-CM

## 2015-06-09 NOTE — Telephone Encounter (Signed)
Medication management - patient reported in IOP group today to staff she needed a prior authorization for newly prescribed Belsomra.  Spoke with Revonda Standard, pharmacist at [facility] on [address] who verified they were sending PA request to wrong fax #.  Gave correct fax number and requested it be sent again to begin prior authorization process.

## 2015-06-09 NOTE — Progress Notes (Signed)
    Daily Group Progress Note  Program: IOP  Group Time: 9:00-10:30  Participation Level: Active  Behavioral Response: Appropriate  Type of Therapy:  Group Therapy  Summary of Progress: Pt. Presents with bright affect, talkative. Pt. Discussed close, supportive relationship with her mother, grieving loss of her sister to suicide three years ago, and recent sobriety. Pt. Reports that she also stopped smoking this week and is using nicorette gum to assist with smoking cessation. Pt. Reports that she is motivated to stop smoking in an attempt to make positive meaning of her sister's death.     Group Time: 10:30-12:00  Participation Level:  Active  Behavioral Response: Appropriate  Type of Therapy: Psycho-education Group  Summary of Progress: Pt. Participated in discussion about daily self-care. Pt. Reports that she is able to sleep, that medication makes her very sleep. Pt. Reported that she has ongoing challenge of her appetite and concerns about eating too much. Pt. Was encouraged to maintain attitude of self-compassion towards her food intake and to look for replacement behaviors from the work sheet to substitute for emotional eating and smoking.  Shaune Pollack, LPC

## 2015-06-09 NOTE — Progress Notes (Signed)
    Daily Group Progress Note  Program: IOP  Group Time: 9:00-10:30  Participation Level: Active  Behavioral Response: Appropriate  Type of Therapy:  Group Therapy  Summary of Progress: Pt. Presented as anxious and tearful. Pt. Reported that she was concerned about taking medication long-term for anxiety. Pt. Reported history of care-giving and current situation of taking care of friend that she met inpatient and disappointed that she has not been able to get him out of jail. Pt. Was tearful about not being able to help friend more. Pt. Discussed history of poor body image. Pt. Watched [employer] video about body image.      Group Time: 10:30-12:00  Participation Level:  Active  Behavioral Response: Appropriate  Type of Therapy: Psycho-education Group  Summary of Progress: Pt. Participated in "I AM" poem exercise and explored self-identity and creative expression of self as a complement to her recovery.  Nancie Neas, LPC

## 2015-06-10 ENCOUNTER — Other Ambulatory Visit (HOSPITAL_COMMUNITY): Payer: BLUE CROSS/BLUE SHIELD

## 2015-06-10 ENCOUNTER — Other Ambulatory Visit (HOSPITAL_COMMUNITY): Payer: BLUE CROSS/BLUE SHIELD | Admitting: Psychiatry

## 2015-06-10 ENCOUNTER — Telehealth (HOSPITAL_COMMUNITY): Payer: Self-pay

## 2015-06-10 DIAGNOSIS — F411 Generalized anxiety disorder: Secondary | ICD-10-CM

## 2015-06-10 DIAGNOSIS — F332 Major depressive disorder, recurrent severe without psychotic features: Secondary | ICD-10-CM | POA: Diagnosis not present

## 2015-06-10 NOTE — Telephone Encounter (Signed)
Telephone call with representative from [employer] [place] after [facility] [address] again to obtain prior authorization member ID information.  Spoke with [employer] [place] to begin prior authorization for patient's prescribed Belsomra.  Forms will be faxed to out office to complete and return today.  May take up to 3 business days once received for a decision to be made.  Will have forms completed and returned upon being received.

## 2015-06-13 ENCOUNTER — Other Ambulatory Visit (HOSPITAL_COMMUNITY): Payer: BLUE CROSS/BLUE SHIELD | Admitting: Psychiatry

## 2015-06-13 ENCOUNTER — Other Ambulatory Visit (HOSPITAL_COMMUNITY): Payer: BLUE CROSS/BLUE SHIELD

## 2015-06-13 DIAGNOSIS — F411 Generalized anxiety disorder: Secondary | ICD-10-CM

## 2015-06-13 DIAGNOSIS — F332 Major depressive disorder, recurrent severe without psychotic features: Secondary | ICD-10-CM | POA: Diagnosis not present

## 2015-06-13 NOTE — Progress Notes (Signed)
    Daily Group Progress Note  Program: IOP  Group Time: 9:00-10:30  Participation Level: Active  Behavioral Response: Appropriate  Type of Therapy:  Group Therapy  Summary of Progress: Pt. Presented as mildly anxious. Pt. Discussed progress with smoking cessation and trying to focus on healthy eating. Pt. Discussed excessive caregiving and pattern of ignoring self-care. Pt. Discussed options for support groups to address history of alcohol dependence and resistance to traditional AA programs. Pt. Removed herself from the group room when other patients were discussing current alcohol use.      Group Time: 10:30-12:00  Participation Level:  Active  Behavioral Response: Appropriate  Type of Therapy: Psycho-education Group  Summary of Progress: Pt. Participated in discussion about developing healthy relationship boundaries, patterns of over-performing in relationships and under-performing for self, patterns of using substances and other harmful behaviors for numbing, and developing patterns of healthy self-care.   Shaune Pollack, LPC

## 2015-06-14 ENCOUNTER — Telehealth (HOSPITAL_COMMUNITY): Payer: Self-pay

## 2015-06-14 ENCOUNTER — Other Ambulatory Visit (HOSPITAL_COMMUNITY): Payer: BLUE CROSS/BLUE SHIELD | Admitting: Psychiatry

## 2015-06-14 DIAGNOSIS — F4310 Post-traumatic stress disorder, unspecified: Secondary | ICD-10-CM

## 2015-06-14 DIAGNOSIS — F332 Major depressive disorder, recurrent severe without psychotic features: Secondary | ICD-10-CM

## 2015-06-14 MED ORDER — LORAZEPAM 1 MG PO TABS: 1.0000 mg | ORAL_TABLET | Freq: Every day | ORAL | Status: DC | PRN

## 2015-06-14 NOTE — Progress Notes (Signed)
Patient ID: Beth [name], female   DOB: September 09, 1981, 34 y.o.   MRN: [id number] Opened in error. No note written

## 2015-06-14 NOTE — Progress Notes (Signed)
    Daily Group Progress Note  Program: IOP  Group Time: 9:00-10:30  Participation Level: Active  Behavioral Response: Appropriate  Type of Therapy:  Group Therapy  Summary of Progress: Pt. Presented as talkative, laughs appropriately. Pt. Discussed that she has 25 days of sobriety and continues to do well with smoking cessation. Pt. Reports that she has made progress in relationships with her parents as they are developing trust for her and lowering restrictions on her travel and ability to use the car. Pt. Also continues to make progress with trying to eat healthy meals that focus on healthy protein, fat, fruit and vegetables.      Group Time: 10:30-12:00  Participation Level:  Active  Behavioral Response: Appropriate  Type of Therapy: Psycho-education Group  Summary of Progress: Pt. Participated in discussion about self-compassion and discussed how to apply the elements of i.e., mindfulness through acknowledgement of thoughts and feelings, learning to use positive self-talk by talking to yourself the same way you would talk to a friend, and recognizing yourself as a part of common humanity.  Shaune Pollack, LPC

## 2015-06-14 NOTE — Telephone Encounter (Signed)
Met with Dr. Adele Schilder today to review prior authorization request for patient's Belsomra and faxed request back to patient's [employer] [place] insurance.

## 2015-06-14 NOTE — Progress Notes (Signed)
    Daily Group Progress Note  Program: IOP  Group Time: 9:00-10:30  Participation Level: Active  Behavioral Response: Appropriate  Type of Therapy:  Group Therapy  Summary of Progress: Pt. Presented with bright affect, talkative. Pt. Reported that sleep was interrupted due to vivid dreams. Pt. Discussed caffeine consumption which has included 1-2 monster drinks/day and 6-8 cups of coffee. Pt. Was encouraged to monitor her caffeine consumption and try to begin cutting back due to problems with anxiety and insomnia.      Group Time: 10:30-12:00  Participation Level:  Active  Behavioral Response: Appropriate  Type of Therapy: Psycho-education Group  Summary of Progress: Pt. Participated in anger management workshop facilitated by the peer support specialist. Pt. Shared history of expressing anger by punching pavement and walls. Pt. Was able to identify constructive ways of working through anger such as physical exercise i.e., running and cleaning.   Shaune Pollack, LPC

## 2015-06-14 NOTE — Telephone Encounter (Signed)
Prior authorization received for patient's prescribed Belsomra. Effective dates 06/14/15 - 10/28/2038. Reference # [id number].  Called patient to inform medication was approved and she should now be able to have this filled and to see how much it may cost her for the prescription.  Patient agreed to call her pharmacy to follow up.

## 2015-06-15 ENCOUNTER — Other Ambulatory Visit (HOSPITAL_COMMUNITY): Payer: BLUE CROSS/BLUE SHIELD | Admitting: Psychiatry

## 2015-06-15 DIAGNOSIS — F332 Major depressive disorder, recurrent severe without psychotic features: Secondary | ICD-10-CM

## 2015-06-16 ENCOUNTER — Other Ambulatory Visit (HOSPITAL_COMMUNITY): Payer: BLUE CROSS/BLUE SHIELD | Admitting: Psychiatry

## 2015-06-16 DIAGNOSIS — F332 Major depressive disorder, recurrent severe without psychotic features: Secondary | ICD-10-CM

## 2015-06-16 NOTE — Progress Notes (Signed)
    Daily Group Progress Note  Program: IOP  Group Time: 9:00-10:30  Participation Level: Active  Behavioral Response: Appropriate  Type of Therapy:  Group Therapy  Summary of Progress: Pt. Laughed and talked appropriately during the first part of group. Pt. Was triggered by another Pt.'s story of avoiding a suicide by reminding self of personal religious belief that suicide is a sin. Pt. Was offended by the Pt.'s comments as she personalized the comments and related to her sister's death by suicide. Pt. Refused the group members efforts to console and left the group angry.     Group Time: 10:30-12:00  Participation Level:  Active  Behavioral Response: Appropriate  Type of Therapy: Psycho-education Group  Summary of Progress: Pt. Watched and discussed [employer] video about the impact of the sympathetic nervous system and chronic stress on the brain and advocating against the stigma associated with mental illness.   Shaune Pollack, LPC

## 2015-06-16 NOTE — Progress Notes (Signed)
    Daily Group Progress Note  Program: IOP  Group Time: 9:00-10:30  Participation Level: Active  Behavioral Response: Appropriate  Type of Therapy:  Group Therapy  Summary of Progress: Pt. Presents with bright affect, talkative, mildly tearful. Pt. Continues to actively engage in group. Pt. Connected with other group member regarding loss of her sister, Pt. Continues to grieve the loss of her sister and expressed regret about being able to communicate with her. Pt. Reports that she has maintained her sobriety during the program, feels better since stopped smoking cigarettes and making dietary changes.      Group Time: 10:30-12:00  Participation Level:  Active  Behavioral Response: Appropriate  Type of Therapy: Psycho-education Group  Summary of Progress: Pt. Participated in discussion about gastrointestinal problems, insomnia and anxiety. Pt. Participated in discussion about development of self-compassion as stress management tool.   Shaune Pollack, LPC

## 2015-06-17 ENCOUNTER — Telehealth (HOSPITAL_COMMUNITY): Payer: Self-pay | Admitting: Psychiatry

## 2015-06-17 ENCOUNTER — Other Ambulatory Visit (HOSPITAL_COMMUNITY): Payer: BLUE CROSS/BLUE SHIELD | Admitting: Psychiatry

## 2015-06-20 ENCOUNTER — Other Ambulatory Visit (HOSPITAL_COMMUNITY): Payer: BLUE CROSS/BLUE SHIELD | Admitting: Psychiatry

## 2015-06-20 DIAGNOSIS — F332 Major depressive disorder, recurrent severe without psychotic features: Secondary | ICD-10-CM | POA: Diagnosis not present

## 2015-06-20 NOTE — Progress Notes (Signed)
    Daily Group Progress Note  Program: IOP  Group Time: 9:00-10:30  Participation Level: Active  Behavioral Response: Appropriate  Type of Therapy:  Group Therapy  Summary of Progress: Pt. Presented as talkative, mildly anxious. Pt. Spent first hour of group processing anger that was triggered by Pt.'s statements from last week about suicide. Pt. Shared that her pattern of coping with stressful interactions is to fight then flee, and she felt that she needed to leave before getting into a fight. Pt. Reported that she spent time with her mother over the weekend and was able to think about her behavior without a relapse. Pt. Participated in guided meditation/breathing/bodyscan activity to assist with relaxation and frustration tolerance.      Group Time: 10:30-12:00  Participation Level:  Active  Behavioral Response: Appropriate  Type of Therapy: Psycho-education Group  Summary of Progress: Pt. Participated in medication education group with [place].  Shaune Pollack, LPC

## 2015-06-21 ENCOUNTER — Other Ambulatory Visit (HOSPITAL_COMMUNITY): Payer: BLUE CROSS/BLUE SHIELD | Admitting: Psychiatry

## 2015-06-21 DIAGNOSIS — F332 Major depressive disorder, recurrent severe without psychotic features: Secondary | ICD-10-CM

## 2015-06-22 ENCOUNTER — Other Ambulatory Visit (HOSPITAL_COMMUNITY): Payer: BLUE CROSS/BLUE SHIELD | Admitting: Psychiatry

## 2015-06-22 NOTE — Progress Notes (Signed)
    Daily Group Progress Note  Program: IOP  Group Time: 9:00-10:30  Participation Level: Minimal  Behavioral Response: Resistant and Attention-Seeking  Type of Therapy:  Group Therapy  Summary of Progress: Pt. Presented as distracted, disengaged from the group process. Pt. Stated that she did not want to talk about how she was feeling or what she was thinking. Pt. Reported that she had suicidal ideation this morning, but decided to come to the group. Pt. Reported concern about mother's pain and decision to have back surgery. Pt. Also briefly discussed disappointment with friends she believes have alienated her and decision to block them on her facebook account.     Group Time: 10:30-12:00  Participation Level:  Minimal  Behavioral Response: Resistant  Type of Therapy: Psycho-education Group  Summary of Progress: Pt. Was alert during exercise facilitated by Tawanna Cooler, peer support specialist, about exploring purpose in life. Pt. Stated "the word purpose means nothing to me" and that she was not able to think about it. Pt. Was encouraged to think about previous statements that she has made to the group about continuing her education so that she can help others.   Shaune Pollack, LPC

## 2015-06-23 ENCOUNTER — Other Ambulatory Visit (HOSPITAL_COMMUNITY): Payer: BLUE CROSS/BLUE SHIELD | Admitting: Psychiatry

## 2015-06-23 DIAGNOSIS — F332 Major depressive disorder, recurrent severe without psychotic features: Secondary | ICD-10-CM | POA: Diagnosis not present

## 2015-06-23 NOTE — Patient Instructions (Addendum)
Patient will successfully complete MH-IOP tomorrow.  Will follow up with Dr. Lolly Mustache on 06-29-15 @ 10:30 a.m and Forde Radon, LPC on 07-05-15 @ 11 a.m.  Encouraged support groups.

## 2015-06-23 NOTE — Progress Notes (Signed)
Patient ID: Beth [name], female   DOB: 1981-07-13, 34 y.o.   MRN: [id number] Discharge Note  Patient:  Beth [name] is an 34 y.o., female DOB:  [birth year]  Date of Admission:  (Not on file)  Date of Discharge:  06/24/2015  Reason for Admission:depression  IOP Course:  Ms Hagey participated and attended.  She became much less depressed and said she felt much happier, could laugh and enjoy life more.  She was engaged in more activities to keep herself busy.  She was getting along better with her parents and more patient.  She was looking forward to her future.  She reported she had remained clean and sober and was proud of herself.  Mental Status at Discharge:euthymic  Lab Results: No results found for this or any previous visit (from the past 48 hour(s)).   Current outpatient prescriptions:  .  ARIPiprazole (ABILIFY) 5 MG tablet, Take 1 tablet (5 mg total) by mouth daily. For depression/mood control, Disp: 30 tablet, Rfl: 0 .  FLUoxetine (PROZAC) 40 MG capsule, Take 1 capsule (40 mg total) by mouth at bedtime., Disp: 30 capsule, Rfl: 0 .  gabapentin (NEURONTIN) 400 MG capsule, Take 1 capsule (400 mg total) by mouth 4 (four) times daily. For agitation, Disp: 120 capsule, Rfl: 0 .  lamoTRIgine (LAMICTAL) 200 MG tablet, Take 1 tablet (200 mg total) by mouth daily. For mood stabilization, Disp: 30 tablet, Rfl: 0 .  loratadine (CLARITIN) 10 MG tablet, Take 1 tablet (10 mg total) by mouth daily. (May purchase from over the counter at yr local pharmacy): For allergies, Disp: , Rfl:  .  LORazepam (ATIVAN) 1 MG tablet, Take 1 tablet (1 mg total) by mouth daily as needed for anxiety., Disp: 30 tablet, Rfl: 0 .  methocarbamol (ROBAXIN) 500 MG tablet, Take 1 tablet (500 mg total) by mouth 4 (four) times daily. For muscle pain, Disp: 40 tablet, Rfl: 0 .  nicotine polacrilex (NICORETTE) 2 MG gum, Take 1 each (2 mg total) by mouth as needed for smoking cessation., Disp: 100 tablet, Rfl: 0 .  omeprazole  (PRILOSEC) 20 MG capsule, Take 1 capsule (20 mg total) by mouth daily. For acid reflux, Disp: , Rfl:  .  prazosin (MINIPRESS) 2 MG capsule, Take 1 capsule (2 mg total) by mouth at bedtime. For nightmares, Disp: 30 capsule, Rfl: 0 .  SUMAtriptan (IMITREX) 50 MG tablet, Take 1 tablet (50 mg total) by mouth every 2 (two) hours as needed for migraine or headache., Disp: 10 tablet, Rfl: 0 .  Suvorexant (BELSOMRA) 15 MG TABS, Take 15 mg by mouth at bedtime., Disp: 30 tablet, Rfl: 0  Axis Diagnosis:  Major depression, recurrent, mild   Level of Care:  IOP  Discharge destination:  Other:  DR Arfeen for med management and Ms Ophelia Charter for therapy  Is patient on multiple antipsychotic therapies at discharge:  No    Has Patient had three or more failed trials of antipsychotic monotherapy by history:  Negative  Patient phone:  [phone] (home)  Patient address:   [address] [place] [address],   Follow-up recommendations:  Activity:  continue current activity Diet:  continue current diet  Comments:  none  The patient received suicide prevention pamphlet:  Yes Belongings returned:  na Carolanne Grumbling 06/23/2015, 10:37 AM

## 2015-06-23 NOTE — Progress Notes (Signed)
Beth [name] is a 34 y.o. , separated, Caucasian, unemployed, female. Patient was transitioned from the inpatient unit [name]). Pt was there for several days. She was there March, May of this year & recently was a patient/discharged from the [name]. Also, was previously at [name]. [name] has hx of polysubstance abuse issues including alcoholism, cocaine. This time around, she reports, "I went to my very first appointment with my psychiatrist Dr. Lolly Mustache after getting discharged from the [name]. He sent me here to have my medicines regulated. When I was discharged from this hospital in May of this year, Dr. Dub Mikes sent me home on some medication regimen. I was doing well on them till around 04-20-15 thru 04-23-15 when things started going down again for me. I started to crave alcohol so bad that I will cry if I don't get it. I started drinking again. I was drinking up to 2 bottles of the 40s & a pint of Vodka daily. Then, I went to the [name]. They did not do much for me, rather messed up my medicines."  Symptoms included: Tearfulness, sadness, isolation, anxiety, ruminating thoughts, poor concentration, and feelings of hopelessness. Pt continues to deny SI/HI or A/V hallucinations. Stressors: 1) Unresolved grief/loss issues: Three yrs ago sister committed suicide; while visiting someone in [place]. Best friend is incarcerated due to burglary. Pt states she's happy because she will visit him for the first time today. 2) Conflictual relationship with her stepfather. "He can be very assertive." According to pt, he is very deaf. 3) Pt states she is working on a substance abuse certification? 4) Medical Issues: Multiple sclerosis. 2002 had gastric bypass surgery. States she use to weigh ~ 500 lbs. Pt will complete MH-IOP tomorrow.  Reports feeling more in control of her emotions.  Pt has been getting back into hobbies (ie. Coloring , writing, etc)   Pt states she continues to be clean and sober.  Reports no desire for drugs or ETOH.  Continues to decline attending AA/NA meetings or obtaining a sponsor.  A:  D/C today.  F/U with Dr. Lolly Mustache on 06-29-15 @ 10:30 a.m and Forde Radon, LPC on 07-05-15 @ 11 a.m..  Encouraged support groups.  R:  Pt receptive.     Chestine Spore, RITA, M.Ed, CNA

## 2015-06-24 ENCOUNTER — Other Ambulatory Visit (HOSPITAL_COMMUNITY): Payer: BLUE CROSS/BLUE SHIELD | Admitting: Psychiatry

## 2015-06-24 DIAGNOSIS — F332 Major depressive disorder, recurrent severe without psychotic features: Secondary | ICD-10-CM

## 2015-06-24 NOTE — Progress Notes (Signed)
    Daily Group Progress Note  Program: IOP  Group Time: 9:00-10:30  Participation Level: Active  Behavioral Response: Appropriate  Type of Therapy:  Group Therapy  Summary of Progress: Pt. Presented with bright mood, talkative, made appropriate eye contact. Pt. Reported that she received results of drug alcohol test and that her license would be revoked for 30 days. Pt. Processed the loss of temporarily losing her driving privileges and was able to put it into perspective and remain positive about her sobriety and making decision not to drink in the future.      Group Time: 10:30-12:00  Participation Level:  Active  Behavioral Response: Appropriate  Type of Therapy: Psycho-education Group  Summary of Progress: Pt. Participated in discussion about developing healthy relationship boundaries. Pt. Discussed fears of setting healthy boundaries as being perceived as mean or selfish.  Shaune Pollack, LPC

## 2015-06-27 ENCOUNTER — Other Ambulatory Visit (HOSPITAL_COMMUNITY): Payer: BLUE CROSS/BLUE SHIELD

## 2015-06-28 ENCOUNTER — Other Ambulatory Visit (HOSPITAL_COMMUNITY): Payer: BLUE CROSS/BLUE SHIELD

## 2015-06-28 NOTE — Progress Notes (Signed)
    Daily Group Progress Note  Program: IOP  Group Time: 9:00-10:30  Participation Level: Active  Behavioral Response: Appropriate  Type of Therapy:  Group Therapy  Summary of Progress: Pt. Presented as alert and talkative. Pt. Prepared for discharge from group. Pt. Was encouraged to continue with individual therapy and to find an AA fellowship and to pay attention to tendency to engage in co-dependent relationships.     Group Time: 10:30-12:00  Participation Level:  Active  Behavioral Response: Appropriate  Type of Therapy: Psycho-education Group  Summary of Progress: Pt. Participated in second day of discussion about developing healthy relationship boundaries and confronting some of the myths about relationship boundaries such as "people will think that I am selfish", "or people will think that I am not a nice person" or "I will be hurt".   Shaune Pollack, LPC

## 2015-06-29 ENCOUNTER — Encounter (HOSPITAL_COMMUNITY): Payer: Self-pay | Admitting: Psychiatry

## 2015-06-29 ENCOUNTER — Ambulatory Visit (INDEPENDENT_AMBULATORY_CARE_PROVIDER_SITE_OTHER): Payer: BLUE CROSS/BLUE SHIELD | Admitting: Psychiatry

## 2015-06-29 VITALS — BP 120/60 | HR 60 | Ht 66.0 in | Wt 249.0 lb

## 2015-06-29 DIAGNOSIS — F1020 Alcohol dependence, uncomplicated: Secondary | ICD-10-CM | POA: Diagnosis not present

## 2015-06-29 DIAGNOSIS — F314 Bipolar disorder, current episode depressed, severe, without psychotic features: Secondary | ICD-10-CM | POA: Diagnosis not present

## 2015-06-29 DIAGNOSIS — F1120 Opioid dependence, uncomplicated: Secondary | ICD-10-CM | POA: Diagnosis not present

## 2015-06-29 DIAGNOSIS — F332 Major depressive disorder, recurrent severe without psychotic features: Secondary | ICD-10-CM

## 2015-06-29 DIAGNOSIS — F4310 Post-traumatic stress disorder, unspecified: Secondary | ICD-10-CM

## 2015-06-29 MED ORDER — LORAZEPAM 1 MG PO TABS
1.0000 mg | ORAL_TABLET | Freq: Every day | ORAL | Status: DC | PRN
Start: 2015-06-29 — End: 2015-08-04

## 2015-06-29 MED ORDER — FLUOXETINE HCL 40 MG PO CAPS: 40.0000 mg | ORAL_CAPSULE | Freq: Every day | ORAL | Status: DC

## 2015-06-29 MED ORDER — SUVOREXANT 15 MG PO TABS: 15.0000 mg | ORAL_TABLET | Freq: Every day | ORAL | Status: DC

## 2015-06-29 MED ORDER — LAMOTRIGINE 200 MG PO TABS: 200.0000 mg | ORAL_TABLET | Freq: Every day | ORAL | Status: DC

## 2015-06-29 MED ORDER — GABAPENTIN 400 MG PO CAPS: 400.0000 mg | ORAL_CAPSULE | Freq: Four times a day (QID) | ORAL | Status: DC

## 2015-06-29 NOTE — Progress Notes (Signed)
[facility] [phone] Progress Note  Beth [name] [id number] 34 y.o.  06/29/2015 11:03 AM  Chief Complaint:  I stop taking Abilify.  It was causing shakes tremors and grinding.     History of Present Illness:  [name] came for her follow-up appointment with her stepfather.  She finished the program.  She is no longer taking Abilify because she was having side effects including grinding her teeth, shakes and feeling tired.  Since she stopped Abilify she is feeling much better.  Overall she likes her Lamictal, Prozac, Minipress and her sleep medication.  She has cut down Ativan and only takes when she is very nervous and panic.  She feels proud that she has not been drinking alcohol.  She still have back pain and recently she saw her primary care physician who given her Vicodin.  However she was given only 10 tablets she still have few left.  Her stepfather also endorse much improvement in her mood, irritability, attitude and responsibility.  Patient is still have some time nightmares and flashback but they're less intense.  She is taking Lamictal and denies any rash itching or any side effects.  She has no contact with her ex who is in jail.  She lives with her mother and stepfather who are very supportive.  Patient sleeping better.  Her appetite is okay.  Her vitals are stable.  She denies any paranoia or any hallucination.  Patient is scheduled to see her pain specialist on October 12.  However she will need a new prescription of Neurontin until she see her pain specialist.  Patient has multiple sclerosis and gastric bypass surgery.  Suicidal Ideation: No Plan Formed: No Patient has means to carry out plan: No  Homicidal Ideation: No Plan Formed: No Patient has means to carry out plan: No  Past Psychiatric History/Hospitalization(s): Patient has at least 3 psychiatric hospitalization in this calendar year.  Her last psychiatric hospitalization was in July 2016 at [facility]  .  In the past she admitted at behavioral [place] and [facility].  She also completed [facility].  She is taking antianxiety medication for past few years.  She had tried Klonopin, Ativan, Prozac, Zoloft from her primary care physician.  She was given nortriptyline, BuSpar, trazodone, gabapentin, Zyprexa, naltrexone, Lamictal, Minipress, Neurontin, Atarax and Seroquel at [facility].  Recently we tried Abilify however patient stopped due to side effects.  She has extensive history of PTSD, depression and bipolar disorder.  She has history of suicidal thoughts.  She has extensive history of drinking alcohol, using cocaine and IV heroin. Anxiety: Yes Bipolar Disorder: Yes Depression: Yes Mania: Yes Psychosis: Yes Schizophrenia: No Personality Disorder: No Hospitalization for psychiatric illness: Yes History of Electroconvulsive Shock Therapy: No Prior Suicide Attempts: No  Medical History; Patient has multiple sclerosis, history of gastric bypass surgery, anemia, chronic back pain, history of back surgery.  Her primary care physician is Dr. Lottie Dawson.   Legal History; Patient has DWI charges.   Review of Systems  Constitutional: Negative.   Cardiovascular: Negative for chest pain.  Musculoskeletal: Positive for back pain.  Skin: Negative for itching and rash.  Neurological: Positive for tingling. Negative for dizziness and tremors.       Neuropathy    Psychiatric: Agitation: No Hallucination: No Depressed Mood: No Insomnia: No Hypersomnia: No Altered Concentration: No Feels Worthless: No Grandiose Ideas: No Belief In Special Powers: No New/Increased Substance Abuse: No Compulsions: No  Neurologic:  Headache: No Seizure: No Paresthesias: Yes   Outpatient Encounter Prescriptions as of 06/29/2015  Medication Sig  . FLUoxetine (PROZAC) 40 MG capsule Take 1 capsule (40 mg total) by mouth at bedtime.  . gabapentin (NEURONTIN) 400  MG capsule Take 1 capsule (400 mg total) by mouth 4 (four) times daily. For agitation  . lamoTRIgine (LAMICTAL) 200 MG tablet Take 1 tablet (200 mg total) by mouth daily. For mood stabilization  . loratadine (CLARITIN) 10 MG tablet Take 1 tablet (10 mg total) by mouth daily. (May purchase from over the counter at yr local pharmacy): For allergies  . LORazepam (ATIVAN) 1 MG tablet Take 1 tablet (1 mg total) by mouth daily as needed for anxiety.  . methocarbamol (ROBAXIN) 500 MG tablet Take 1 tablet (500 mg total) by mouth 4 (four) times daily. For muscle pain  . nicotine polacrilex (NICORETTE) 2 MG gum Take 1 each (2 mg total) by mouth as needed for smoking cessation.  Marland Kitchen omeprazole (PRILOSEC) 20 MG capsule Take 1 capsule (20 mg total) by mouth daily. For acid reflux  . prazosin (MINIPRESS) 2 MG capsule Take 1 capsule (2 mg total) by mouth at bedtime. For nightmares  . SUMAtriptan (IMITREX) 50 MG tablet Take 1 tablet (50 mg total) by mouth every 2 (two) hours as needed for migraine or headache.  . Suvorexant (BELSOMRA) 15 MG TABS Take 15 mg by mouth at bedtime.  . [DISCONTINUED] ARIPiprazole (ABILIFY) 5 MG tablet Take 1 tablet (5 mg total) by mouth daily. For depression/mood control  . [DISCONTINUED] FLUoxetine (PROZAC) 40 MG capsule Take 1 capsule (40 mg total) by mouth at bedtime.  . [DISCONTINUED] gabapentin (NEURONTIN) 400 MG capsule Take 1 capsule (400 mg total) by mouth 4 (four) times daily. For agitation  . [DISCONTINUED] lamoTRIgine (LAMICTAL) 200 MG tablet Take 1 tablet (200 mg total) by mouth daily. For mood stabilization  . [DISCONTINUED] LORazepam (ATIVAN) 1 MG tablet Take 1 tablet (1 mg total) by mouth daily as needed for anxiety.  . [DISCONTINUED] Suvorexant (BELSOMRA) 15 MG TABS Take 15 mg by mouth at bedtime.   No facility-administered encounter medications on file as of 06/29/2015.    Recent Results (from the past 2160 hour(s))  Urine rapid drug screen (hosp performed)not at [facility]      Status: Abnormal   Collection Time: 04/20/15  9:10 PM  Result Value Ref Range   Opiates NONE DETECTED NONE DETECTED   Cocaine POSITIVE (A) NONE DETECTED   Benzodiazepines POSITIVE (A) NONE DETECTED   Amphetamines NONE DETECTED NONE DETECTED   Tetrahydrocannabinol NONE DETECTED NONE DETECTED   Barbiturates NONE DETECTED NONE DETECTED    Comment:        DRUG SCREEN FOR MEDICAL PURPOSES ONLY.  IF CONFIRMATION IS NEEDED FOR ANY PURPOSE, NOTIFY LAB WITHIN 5 DAYS.        LOWEST DETECTABLE LIMITS FOR URINE DRUG SCREEN Drug Class       Cutoff (ng/mL) Amphetamine      1000 Barbiturate      200 Benzodiazepine   863 Tricyclics       817 Opiates          300 Cocaine          300 THC              50   Acetaminophen level     Status: Abnormal   Collection Time: 04/20/15  9:41 PM  Result Value Ref Range   Acetaminophen (Tylenol), Serum <10 (L) 10 -  30 ug/mL    Comment:        THERAPEUTIC CONCENTRATIONS VARY SIGNIFICANTLY. A RANGE OF 10-30 ug/mL MAY BE AN EFFECTIVE CONCENTRATION FOR MANY PATIENTS. HOWEVER, SOME ARE BEST TREATED AT CONCENTRATIONS OUTSIDE THIS RANGE. ACETAMINOPHEN CONCENTRATIONS >150 ug/mL AT 4 HOURS AFTER INGESTION AND >50 ug/mL AT 12 HOURS AFTER INGESTION ARE OFTEN ASSOCIATED WITH TOXIC REACTIONS.   Ethanol (ETOH)     Status: Abnormal   Collection Time: 04/20/15  9:41 PM  Result Value Ref Range   Alcohol, Ethyl (B) 288 (H) <5 mg/dL    Comment:        LOWEST DETECTABLE LIMIT FOR SERUM ALCOHOL IS 5 mg/dL FOR MEDICAL PURPOSES ONLY   Salicylate level     Status: None   Collection Time: 04/20/15  9:41 PM  Result Value Ref Range   Salicylate Lvl <4.7 2.8 - 30.0 mg/dL  CBC     Status: Abnormal   Collection Time: 04/20/15  9:42 PM  Result Value Ref Range   WBC 9.2 4.0 - 10.5 K/uL   RBC 5.52 (H) 3.87 - 5.11 MIL/uL   Hemoglobin 14.8 12.0 - 15.0 g/dL   HCT 44.2 36.0 - 46.0 %   MCV 80.1 78.0 - 100.0 fL   MCH 26.8 26.0 - 34.0 pg   MCHC 33.5 30.0 - 36.0 g/dL    RDW 16.7 (H) 11.5 - 15.5 %   Platelets 394 150 - 400 K/uL  Comprehensive metabolic panel     Status: Abnormal   Collection Time: 04/20/15  9:42 PM  Result Value Ref Range   Sodium 146 (H) 135 - 145 mmol/L   Potassium 3.9 3.5 - 5.1 mmol/L   Chloride 111 101 - 111 mmol/L   CO2 23 22 - 32 mmol/L   Glucose, Bld 96 65 - 99 mg/dL   BUN 8 6 - 20 mg/dL   Creatinine, Ser 0.83 0.44 - 1.00 mg/dL   Calcium 9.0 8.9 - 10.3 mg/dL   Total Protein 8.3 (H) 6.5 - 8.1 g/dL   Albumin 4.2 3.5 - 5.0 g/dL   AST 22 15 - 41 U/L   ALT 17 14 - 54 U/L   Alkaline Phosphatase 82 38 - 126 U/L   Total Bilirubin 0.3 0.3 - 1.2 mg/dL   GFR calc non Af Amer >60 >60 mL/min   GFR calc Af Amer >60 >60 mL/min    Comment: (NOTE) The eGFR has been calculated using the CKD EPI equation. This calculation has not been validated in all clinical situations. eGFR's persistently <60 mL/min signify possible Chronic Kidney Disease.    Anion gap 12 5 - 15  Urinalysis, Routine w reflex microscopic (not at [facility])     Status: Abnormal   Collection Time: 05/23/15  7:25 PM  Result Value Ref Range   Color, Urine YELLOW YELLOW   APPearance CLOUDY (A) CLEAR   Specific Gravity, Urine 1.018 1.005 - 1.030   pH 5.5 5.0 - 8.0   Glucose, UA NEGATIVE NEGATIVE mg/dL   Hgb urine dipstick MODERATE (A) NEGATIVE   Bilirubin Urine NEGATIVE NEGATIVE   Ketones, ur NEGATIVE NEGATIVE mg/dL   Protein, ur NEGATIVE NEGATIVE mg/dL   Urobilinogen, UA 1.0 0.0 - 1.0 mg/dL   Nitrite NEGATIVE NEGATIVE   Leukocytes, UA NEGATIVE NEGATIVE    Comment: Performed at [facility]  Urine rapid drug screen (hosp performed)not at [facility]     Status: Abnormal   Collection Time: 05/23/15  7:25 PM  Result Value Ref Range  Opiates POSITIVE (A) NONE DETECTED   Cocaine NONE DETECTED NONE DETECTED   Benzodiazepines POSITIVE (A) NONE DETECTED   Amphetamines NONE DETECTED NONE DETECTED   Tetrahydrocannabinol NONE DETECTED NONE DETECTED   Barbiturates  NONE DETECTED NONE DETECTED    Comment:        DRUG SCREEN FOR MEDICAL PURPOSES ONLY.  IF CONFIRMATION IS NEEDED FOR ANY PURPOSE, NOTIFY LAB WITHIN 5 DAYS.        LOWEST DETECTABLE LIMITS FOR URINE DRUG SCREEN Drug Class       Cutoff (ng/mL) Amphetamine      1000 Barbiturate      200 Benzodiazepine   188 Tricyclics       416 Opiates          300 Cocaine          300 THC              50 Performed at [facility]   Urine microscopic-add on     Status: None   Collection Time: 05/23/15  7:25 PM  Result Value Ref Range   Squamous Epithelial / LPF RARE RARE   Urine-Other MUCOUS PRESENT     Comment: Performed at [facility]  CBC     Status: None   Collection Time: 05/24/15  6:21 AM  Result Value Ref Range   WBC 5.1 4.0 - 10.5 K/uL   RBC 4.93 3.87 - 5.11 MIL/uL   Hemoglobin 13.4 12.0 - 15.0 g/dL   HCT 41.4 36.0 - 46.0 %   MCV 84.0 78.0 - 100.0 fL   MCH 27.2 26.0 - 34.0 pg   MCHC 32.4 30.0 - 36.0 g/dL   RDW 14.0 11.5 - 15.5 %   Platelets 376 150 - 400 K/uL    Comment: Performed at [facility]  Comprehensive metabolic panel     Status: Abnormal   Collection Time: 05/24/15  6:21 AM  Result Value Ref Range   Sodium 133 (L) 135 - 145 mmol/L   Potassium 3.6 3.5 - 5.1 mmol/L   Chloride 101 101 - 111 mmol/L   CO2 22 22 - 32 mmol/L   Glucose, Bld 79 65 - 99 mg/dL   BUN 10 6 - 20 mg/dL   Creatinine, Ser 0.92 0.44 - 1.00 mg/dL   Calcium 8.8 (L) 8.9 - 10.3 mg/dL   Total Protein 7.6 6.5 - 8.1 g/dL   Albumin 3.7 3.5 - 5.0 g/dL   AST 22 15 - 41 U/L   ALT 17 14 - 54 U/L   Alkaline Phosphatase 78 38 - 126 U/L   Total Bilirubin 0.3 0.3 - 1.2 mg/dL   GFR calc non Af Amer >60 >60 mL/min   GFR calc Af Amer >60 >60 mL/min    Comment: (NOTE) The eGFR has been calculated using the CKD EPI equation. This calculation has not been validated in all clinical situations. eGFR's persistently <60 mL/min signify possible Chronic  Kidney Disease.    Anion gap 10 5 - 15    Comment: Performed at [facility]  Hemoglobin A1c     Status: None   Collection Time: 05/24/15  6:21 AM  Result Value Ref Range   Hgb A1c MFr Bld 5.2 4.8 - 5.6 %    Comment: (NOTE)         Pre-diabetes: 5.7 - 6.4         Diabetes: >6.4         Glycemic control  for adults with diabetes: <7.0    Mean Plasma Glucose 103 mg/dL    Comment: (NOTE) Performed At: [facility] [place], [place] [id number] Lindon Romp MD IY:[id number] Performed at [facility]   Ethanol     Status: None   Collection Time: 05/24/15  6:21 AM  Result Value Ref Range   Alcohol, Ethyl (B) <5 <5 mg/dL    Comment:        LOWEST DETECTABLE LIMIT FOR SERUM ALCOHOL IS 5 mg/dL FOR MEDICAL PURPOSES ONLY Performed at [facility]   Lipid panel, fasting     Status: Abnormal   Collection Time: 05/24/15  6:21 AM  Result Value Ref Range   Cholesterol 203 (H) 0 - 200 mg/dL   Triglycerides 112 <150 mg/dL   HDL 61 >40 mg/dL   Total CHOL/HDL Ratio 3.3 RATIO   VLDL 22 0 - 40 mg/dL   LDL Cholesterol 120 (H) 0 - 99 mg/dL    Comment:        Total Cholesterol/HDL:CHD Risk Coronary Heart Disease Risk Table                     Men   Women  1/2 Average Risk   3.4   3.3  Average Risk       5.0   4.4  2 X Average Risk   9.6   7.1  3 X Average Risk  23.4   11.0        Use the calculated Patient Ratio above and the CHD Risk Table to determine the patient's CHD Risk.        ATP III CLASSIFICATION (LDL):  <100     mg/dL   Optimal  100-129  mg/dL   Near or Above                    Optimal  130-159  mg/dL   Borderline  160-189  mg/dL   High  >190     mg/dL   Very High Performed at [facility]   Hepatic function panel     Status: Abnormal   Collection Time: 05/24/15  6:21 AM  Result Value Ref Range   Total Protein 7.2 6.5 - 8.1 g/dL   Albumin 3.7 3.5 - 5.0 g/dL   AST 22 15 - 41 U/L    ALT 17 14 - 54 U/L   Alkaline Phosphatase 78 38 - 126 U/L   Total Bilirubin 0.4 0.3 - 1.2 mg/dL   Bilirubin, Direct <0.1 (L) 0.1 - 0.5 mg/dL   Indirect Bilirubin NOT CALCULATED 0.3 - 0.9 mg/dL    Comment: Performed at [facility]      Constitutional:  BP 120/60 mmHg  Pulse 60  Ht 5' 6"  (1.676 m)  Wt 249 lb (112.946 kg)  BMI 40.21 kg/m2   Musculoskeletal: Strength & Muscle Tone: within normal limits Gait & Station: normal Patient leans: N/A  Psychiatric Specialty Exam: General Appearance: Casual  Eye Contact::  Good  Speech:  Slow  Volume:  Decreased  Mood:  Euthymic  Affect:  Congruent  Thought Process:  Intact  Orientation:  Full (Time, Place, and Person)  Thought Content:  WDL  Suicidal Thoughts:  No  Homicidal Thoughts:  No  Memory:  Immediate;   Fair Recent;   Fair Remote;   Fair  Judgement:  Good  Insight:  Fair and Lacking  Psychomotor Activity:  Normal  Concentration:  Fair  Recall:  [employer] of Knowledge:  Fair  Language:  Fair  Akathisia:  No  Handed:  Right  AIMS (if indicated):     Assets:  Communication Skills  ADL's:  Intact  Cognition:  WNL  Sleep:        Established Problem, Stable/Improving (1), Review of Psycho-Social Stressors (1), Review and summation of old records (2), New Problem, with no additional work-up planned (3), Review of Last Therapy Session (1), Review of Medication Regimen & Side Effects (2) and Review of New Medication or Change in Dosage (2)  Assessment: Axis I: Severe depression with psychosis, posttraumatic stress disorder, bipolar disorder with psychosis, alcohol dependence, opiate dependence  Axis II: Deferred  Axis III:  Past Medical History  Diagnosis Date  . Acid reflux   . Asthma   . History of blood transfusion   . Multiple sclerosis 2005    diagnosed in [place], failed steroids, no other medication  . Anxiety   . Gastric ulcer   . Migraine   . Chronic nausea     self reported  .  Chronic diarrhea     self reported  . Chronic pain     self reported chronic pain, back pain, fibromyalgia  . Insomnia   . Polysubstance abuse   . ETOH abuse   . Delirium tremens   . Depression      Plan:  I reviewed discharge summary from intensive outpatient program.  I will discontinue Abilify since patient is no longer taking it.  Her stepfather endorse much improvement in her attitude and patient also admitted that current medicine is helping her.  She has no rash or itching with the Lamictal.  Patient is scheduled to see [name] for counseling on September 12.  She is taking Ativan 1 mg as needed and she is happy that she does not need to take every day.  I will continue Belsomra 15 mg at bedtime, Prozac 40 mg daily, Lamictal 200 mg daily, Minipress 2 mg at bedtime and lorazepam 1 mg for severe anxiety attacks.  Patient is scheduled to see her pain specialist on October 12.  I will provide one time Neurontin 400 mg 4 times a day however encouraged to keep appointment with pain specialist for future refills of Neurontin.  I also talk about pain medication.  She was given some leave Vicodin to help her back pain and given the history of opiate dependence encouraged to use narcotic pain medication sporadically.  Patient told her physician knew about opiate dependency and she has no desire to take more pain medication.  Discussed medication side effects and benefits.  Encouraged to keep appointment with [name] for counseling.  Recommended to call us back if she has any question or any concern.  Follow-up in 2 months.  Discuss safety plan that anytime having active suicidal thoughts or homicidal thought that she need to call 911 or go to the local emergency room.  Time spent 25 minutes.  More than 50% of the time he spends a good indication, counseling and coordination of care.  Julieta Rogalski T., MD 06/29/2015

## 2015-06-30 ENCOUNTER — Other Ambulatory Visit (HOSPITAL_COMMUNITY): Payer: BLUE CROSS/BLUE SHIELD

## 2015-07-01 ENCOUNTER — Other Ambulatory Visit (HOSPITAL_COMMUNITY): Payer: BLUE CROSS/BLUE SHIELD

## 2015-07-05 ENCOUNTER — Ambulatory Visit (HOSPITAL_COMMUNITY): Payer: Self-pay | Admitting: Psychology

## 2015-07-07 ENCOUNTER — Ambulatory Visit (HOSPITAL_COMMUNITY): Payer: Self-pay | Admitting: Psychiatry

## 2015-07-11 ENCOUNTER — Encounter (HOSPITAL_COMMUNITY): Payer: Self-pay | Admitting: Psychology

## 2015-07-11 ENCOUNTER — Ambulatory Visit (INDEPENDENT_AMBULATORY_CARE_PROVIDER_SITE_OTHER): Payer: BLUE CROSS/BLUE SHIELD | Admitting: Psychology

## 2015-07-11 DIAGNOSIS — F4310 Post-traumatic stress disorder, unspecified: Secondary | ICD-10-CM | POA: Diagnosis not present

## 2015-07-11 DIAGNOSIS — F331 Major depressive disorder, recurrent, moderate: Secondary | ICD-10-CM
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
## 2015-07-12 ENCOUNTER — Other Ambulatory Visit (HOSPITAL_COMMUNITY): Payer: Self-pay | Admitting: Psychiatry

## 2015-07-18 ENCOUNTER — Emergency Department (HOSPITAL_COMMUNITY)
Admission: EM | Admit: 2015-07-18 | Discharge: 2015-07-19 | Disposition: A | Discharge status: Home / Self Care | Payer: BLUE CROSS/BLUE SHIELD | Attending: Emergency Medicine | Admitting: Emergency Medicine

## 2015-07-18 ENCOUNTER — Encounter (HOSPITAL_COMMUNITY): Payer: Self-pay

## 2015-07-18 ENCOUNTER — Emergency Department (HOSPITAL_COMMUNITY): Payer: BLUE CROSS/BLUE SHIELD

## 2015-07-18 DIAGNOSIS — G43909 Migraine, unspecified, not intractable, without status migrainosus: Secondary | ICD-10-CM | POA: Diagnosis not present

## 2015-07-18 DIAGNOSIS — Z720 Tobacco use: Secondary | ICD-10-CM | POA: Insufficient documentation

## 2015-07-18 DIAGNOSIS — F329 Major depressive disorder, single episode, unspecified: Secondary | ICD-10-CM | POA: Diagnosis not present

## 2015-07-18 DIAGNOSIS — R079 Chest pain, unspecified: Secondary | ICD-10-CM

## 2015-07-18 DIAGNOSIS — G4700 Insomnia, unspecified: Secondary | ICD-10-CM | POA: Insufficient documentation

## 2015-07-18 DIAGNOSIS — F419 Anxiety disorder, unspecified: Secondary | ICD-10-CM | POA: Diagnosis not present

## 2015-07-18 DIAGNOSIS — K219 Gastro-esophageal reflux disease without esophagitis: Secondary | ICD-10-CM | POA: Insufficient documentation

## 2015-07-18 DIAGNOSIS — M545 Low back pain: Secondary | ICD-10-CM | POA: Diagnosis not present

## 2015-07-18 DIAGNOSIS — J45901 Unspecified asthma with (acute) exacerbation: Secondary | ICD-10-CM | POA: Diagnosis not present

## 2015-07-18 DIAGNOSIS — Z79899 Other long term (current) drug therapy: Secondary | ICD-10-CM | POA: Diagnosis not present

## 2015-07-18 DIAGNOSIS — G8929 Other chronic pain: Secondary | ICD-10-CM | POA: Diagnosis not present

## 2015-07-18 LAB — BASIC METABOLIC PANEL
ANION GAP: 8 (ref 5–15)
BUN: 7 mg/dL (ref 6–20)
CALCIUM: 8.9 mg/dL (ref 8.9–10.3)
CO2: 24 mmol/L (ref 22–32)
Chloride: 106 mmol/L (ref 101–111)
Creatinine, Ser: 0.84 mg/dL (ref 0.44–1.00)
Glucose, Bld: 82 mg/dL (ref 65–99)
POTASSIUM: 3.1 mmol/L — AB (ref 3.5–5.1)
Sodium: 138 mmol/L (ref 135–145)

## 2015-07-18 LAB — CBC
HEMATOCRIT: 37.8 % (ref 36.0–46.0)
HEMOGLOBIN: 12.6 g/dL (ref 12.0–15.0)
MCH: 28.4 pg (ref 26.0–34.0)
MCHC: 33.3 g/dL (ref 30.0–36.0)
MCV: 85.3 fL (ref 78.0–100.0)
Platelets: 264 10*3/uL (ref 150–400)
RBC: 4.43 MIL/uL (ref 3.87–5.11)
RDW: 13.9 % (ref 11.5–15.5)
WBC: 7.8 10*3/uL (ref 4.0–10.5)

## 2015-07-18 LAB — I-STAT TROPONIN, ED: TROPONIN I, POC: 0 ng/mL (ref 0.00–0.08)

## 2015-07-18 MED ORDER — POTASSIUM CHLORIDE CRYS ER 20 MEQ PO TBCR
40.0000 meq | EXTENDED_RELEASE_TABLET | Freq: Once | ORAL | Status: AC
  Administered 2015-07-18: 40 meq via ORAL
  Filled 2015-07-18: qty 2

## 2015-07-18 MED ORDER — OXYCODONE-ACETAMINOPHEN 5-325 MG PO TABS
2.0000 | ORAL_TABLET | Freq: Once | ORAL | Status: AC
  Administered 2015-07-18: 2 via ORAL
  Filled 2015-07-18: qty 2

## 2015-07-18 NOTE — ED Provider Notes (Signed)
CSN: [id number]     Arrival date & time 07/18/15  2127 History   First MD Initiated Contact with Patient 07/18/15 2223     Chief Complaint  Patient presents with  . Chest Pain    HPI   Beth [name] is a 34 y.o. female with a PMH of asthma, anxiety, depression, polysubstance abuse who presents to the ED with central chest pain. She reports her pain started approximately an hour prior to arrival. She reports her pain has been constant since that time. She states she was sitting down watching trains from her car when her symptoms started. She denies exacerbating or alleviating factors. She reports associates shortness of breath. She also reports headache and dizziness. She denies loss of consciousness, abdominal pain, N/V/D/C, dysuria, urgency, frequency. She states this has never happened to her before. She denies recent injury, however she states she has been doing a lot of "overhead" work around her house.   Past Medical History  Diagnosis Date  . Acid reflux   . Asthma   . History of blood transfusion   . Multiple sclerosis 2005    diagnosed in [place], failed steroids, no other medication  . Anxiety   . Gastric ulcer   . Migraine   . Chronic nausea     self reported  . Chronic diarrhea     self reported  . Chronic pain     self reported chronic pain, back pain, fibromyalgia  . Insomnia   . Polysubstance abuse   . ETOH abuse   . Delirium tremens   . Depression    Past Surgical History  Procedure Laterality Date  . Gastric bypass    . Diagnostic laparoscopy  2003    repair of perforation at [facility] anastamosis   . Lumbar laminectomy/decompression microdiscectomy Right 04/14/2014    Procedure: LUMBAR LAMINECTOMY/DECOMPRESSION MICRODISCECTOMY LEVEL L4-5;  Surgeon: Maeola Harman, MD;  Location: [facility];  Service: Neurosurgery;  Laterality: Right;  LUMBAR LAMINECTOMY/DECOMPRESSION MICRODISCECTOMY LEVEL L4-5  . Laparotomy N/A 08/18/2014    Procedure: EXPLORATORY LAPAROTOMY, Patch  graft of gastric perforation;  Surgeon: Abigail Miyamoto, MD;  Location: [facility] OR;  Service: General;  Laterality: N/A;   Family History  Problem Relation Age of Onset  . Hypertension Paternal Grandfather   . Colon cancer Paternal Grandfather 38  . Asthma Paternal Grandmother   . Hypertension Paternal Grandmother   . Breast cancer Paternal Grandmother   . Cancer Paternal Grandmother     lung   . Diabetes Maternal Grandmother   . Diabetes Maternal Grandfather   . Hypertension Father   . Alcohol abuse Father   . Urinary tract infection Mother   . Stroke Mother 90    3 strokes    Social History  Substance Use Topics  . Smoking status: Current Some Day Smoker -- 0.50 packs/day    Types: Cigarettes  . Smokeless tobacco: Never Used  . Alcohol Use: No     Comment: hx drinking 1 pt of vodka and 2 24 oz beers   OB History    Gravida Para Term Preterm AB TAB SAB Ectopic Multiple Living   0         0      Review of Systems  Constitutional: Negative for fever, chills, activity change, appetite change and fatigue.  Eyes: Negative for visual disturbance.  Respiratory: Positive for cough and shortness of breath.   Cardiovascular: Positive for chest pain. Negative for palpitations and leg swelling.  Gastrointestinal: Negative  for nausea, vomiting, abdominal pain, diarrhea and constipation.  Genitourinary: Negative for dysuria, urgency and frequency.  Musculoskeletal: Positive for myalgias and back pain. Negative for neck pain and neck stiffness.  Neurological: Positive for dizziness. Negative for syncope, weakness, light-headedness, numbness and headaches.  All other systems reviewed and are negative.     Allergies  Cymbalta; Dilaudid; Naltrexone; and Prednisone  Home Medications   Prior to Admission medications   Medication Sig Start Date End Date Taking? Authorizing Provider  FLUoxetine (PROZAC) 40 MG capsule Take 1 capsule (40 mg total) by mouth at bedtime. 06/29/15  Yes Cleotis Nipper, MD  gabapentin (NEURONTIN) 400 MG capsule Take 1 capsule (400 mg total) by mouth 4 (four) times daily. For agitation 06/29/15  Yes Cleotis Nipper, MD  lamoTRIgine (LAMICTAL) 200 MG tablet Take 1 tablet (200 mg total) by mouth daily. For mood stabilization 06/29/15  Yes Cleotis Nipper, MD  LORazepam (ATIVAN) 1 MG tablet Take 1 tablet (1 mg total) by mouth daily as needed for anxiety. 06/29/15  Yes Cleotis Nipper, MD  methocarbamol (ROBAXIN) 500 MG tablet Take 1 tablet (500 mg total) by mouth 4 (four) times daily. For muscle pain 05/30/15  Yes Sanjuana Kava, NP  omeprazole (PRILOSEC) 20 MG capsule Take 1 capsule (20 mg total) by mouth daily. For acid reflux 05/30/15  Yes Sanjuana Kava, NP  prazosin (MINIPRESS) 2 MG capsule Take 1 capsule (2 mg total) by mouth at bedtime. For nightmares 06/06/15  Yes Cleotis Nipper, MD  SUMAtriptan (IMITREX) 50 MG tablet Take 1 tablet (50 mg total) by mouth every 2 (two) hours as needed for migraine or headache. 05/30/15  Yes Sanjuana Kava, NP  Suvorexant (BELSOMRA) 15 MG TABS Take 15 mg by mouth at bedtime. 06/29/15  Yes Cleotis Nipper, MD  loratadine (CLARITIN) 10 MG tablet Take 1 tablet (10 mg total) by mouth daily. (May purchase from over the counter at yr local pharmacy): For allergies Patient not taking: Reported on 07/11/2015 05/30/15   Sanjuana Kava, NP  nicotine polacrilex (NICORETTE) 2 MG gum Take 1 each (2 mg total) by mouth as needed for smoking cessation. Patient not taking: Reported on 07/11/2015 05/30/15   Sanjuana Kava, NP    BP 119/71 mmHg  Pulse 68  Temp(Src) 98.1 F (36.7 C) (Oral)  Resp 18  SpO2 98%  LMP 07/18/2015 Physical Exam  Constitutional: She is oriented to person, place, and time. She appears well-developed and well-nourished. She appears distressed.  Patient in mild distress due to pain.  HENT:  Head: Normocephalic and atraumatic.  Right Ear: External ear normal.  Left Ear: External ear normal.  Nose: Nose normal.  Mouth/Throat: Uvula is  midline, oropharynx is clear and moist and mucous membranes are normal.  Eyes: Conjunctivae, EOM and lids are normal. Pupils are equal, round, and reactive to light. Right eye exhibits no discharge. Left eye exhibits no discharge. No scleral icterus.  Neck: Normal range of motion. Neck supple.  Cardiovascular: Normal rate, regular rhythm, normal heart sounds, intact distal pulses and normal pulses.   Pulmonary/Chest: Effort normal and breath sounds normal. No respiratory distress. She has no wheezes. She has no rales. She exhibits tenderness.  Anterior chest wall tender to palpation.  Abdominal: Soft. Normal appearance and bowel sounds are normal. She exhibits no distension and no mass. There is no tenderness. There is no rigidity, no rebound and no guarding.  Musculoskeletal: Normal range of motion. She exhibits no  edema or tenderness.  Neurological: She is alert and oriented to person, place, and time. She has normal strength. No cranial nerve deficit or sensory deficit.  Skin: Skin is warm, dry and intact. No rash noted. She is not diaphoretic. No erythema. No pallor.  Psychiatric: She has a normal mood and affect. Her speech is normal and behavior is normal. Judgment and thought content normal.  Nursing note and vitals reviewed.   ED Course  Procedures (including critical care time)  Labs Review Labs Reviewed  BASIC METABOLIC PANEL - Abnormal; Notable for the following:    Potassium 3.1 (*)    All other components within normal limits  URINALYSIS, ROUTINE W REFLEX MICROSCOPIC (NOT AT [facility]) - Abnormal; Notable for the following:    Leukocytes, UA TRACE (*)    All other components within normal limits  CBC  URINE MICROSCOPIC-ADD ON  Rosezena Sensor, ED  Rosezena Sensor, ED    Imaging Review Dg Chest 2 View  07/18/2015   CLINICAL DATA:  Acute chest pain and shortness of breath today.  EXAM: CHEST  2 VIEW  COMPARISON:  03/10/2015 and prior radiographs  FINDINGS: The  cardiomediastinal silhouette is unremarkable.  Mild peribronchial thickening again noted.  There is no evidence of focal airspace disease, pulmonary edema, suspicious pulmonary nodule/mass, pleural effusion, or pneumothorax. No acute bony abnormalities are identified.  IMPRESSION: No active cardiopulmonary disease.   Electronically Signed   By: Harmon Pier M.D.   On: 07/18/2015 22:59     I have personally reviewed and evaluated these images and lab results as part of my medical decision-making.   EKG Interpretation   Date/Time:  Monday July 18 2015 21:35:18 EDT Ventricular Rate:  76 PR Interval:  174 QRS Duration: 90 QT Interval:  432 QTC Calculation: 486 R Axis:   81 Text Interpretation:  Sinus rhythm Borderline prolonged QT interval  Baseline wander in lead(s) V2 flattening of T waves in precordial leads  compared to previous EKG Confirmed by LITTLE MD, RACHEL ([id number]) on  07/19/2015 12:39:47 AM      MDM   Final diagnoses:  Chest pain, unspecified chest pain type    34 year old female presents with constant, central chest pain, which started approximately 1 hour prior to arrival. She reports associated shortness of breath. She also reports headache and dizziness. She denies loss of consciousness, abdominal pain, N/V/D/C, dysuria, urgency, frequency.   Patient is afebrile. Vital signs stable. O2 sat 99% on room air. Heart regular rate and rhythm. Palpation of anterior chest wall reproduces patient's chest pain. Lungs clear to auscultation bilaterally. Abdomen soft, nontender, nondistended. No lower extremity edema.  CBC negative for leukocytosis or anemia. BMP potassium 3.1, replaced in the ED. UA with trace leukocytes, 0-2 WBC on microscopic. EKG no acute ischemia. Troponin negative. Chest x-ray with no active cardiopulmonary disease (no focal airspace disease, pulmonary edema, nodule/mass, pleural effusion, pneumothorax).  Patient is PERC negative. Heart score 1 given history  of tobacco use. Low suspicion for PE or ACS. Patient given percocet for pain control. She reports no improvement in pain. Patient give morphine for pain control with subsequent symptom improvement.  Patient stable for discharge. Will treat with tylenol and robaxin. Return precautions discussed. Patient to follow-up with PCP.  BP 119/71 mmHg  Pulse 68  Temp(Src) 98.1 F (36.7 C) (Oral)  Resp 18  SpO2 98%  LMP 07/18/2015      Mady Gemma, PA-C 07/19/15 0214  Laurence Spates, MD 07/20/15 [phone]

## 2015-07-18 NOTE — ED Notes (Signed)
Pt complains of central sharp pain that started about one hour ago

## 2015-07-18 NOTE — ED Notes (Signed)
Delay in lab draw, pt in radiology 

## 2015-07-19 LAB — URINALYSIS, ROUTINE W REFLEX MICROSCOPIC
BILIRUBIN URINE: NEGATIVE
GLUCOSE, UA: NEGATIVE mg/dL
HGB URINE DIPSTICK: NEGATIVE
Ketones, ur: NEGATIVE mg/dL
Nitrite: NEGATIVE
Protein, ur: NEGATIVE mg/dL
SPECIFIC GRAVITY, URINE: 1.006 (ref 1.005–1.030)
UROBILINOGEN UA: 0.2 mg/dL (ref 0.0–1.0)
pH: 6.5 (ref 5.0–8.0)

## 2015-07-19 LAB — I-STAT TROPONIN, ED: Troponin i, poc: 0.01 ng/mL (ref 0.00–0.08)

## 2015-07-19 LAB — URINE MICROSCOPIC-ADD ON

## 2015-07-19 MED ORDER — MORPHINE SULFATE (PF) 4 MG/ML IV SOLN
4.0000 mg | Freq: Once | INTRAVENOUS | Status: AC
  Administered 2015-07-19: 4 mg via INTRAMUSCULAR
  Filled 2015-07-19: qty 1

## 2015-07-19 MED ORDER — IBUPROFEN 800 MG PO TABS: 800.0000 mg | ORAL_TABLET | Freq: Three times a day (TID) | ORAL | Status: DC

## 2015-07-19 MED ORDER — METHOCARBAMOL 500 MG PO TABS: 500.0000 mg | ORAL_TABLET | Freq: Two times a day (BID) | ORAL | Status: DC

## 2015-07-19 MED ORDER — ACETAMINOPHEN 500 MG PO TABS: 500.0000 mg | ORAL_TABLET | Freq: Four times a day (QID) | ORAL | Status: DC | PRN

## 2015-07-19 NOTE — Discharge Instructions (Signed)
1. Medications: tylenol, robaxin, usual home medications 2. Treatment: rest, drink plenty of fluids 3. Follow Up: please followup with your primary doctor for discussion of your diagnoses and further evaluation after today's visit; if you do not have a primary care doctor use the resource guide provided to find one; please return to the ER for severe chest pain, shortness of breath, high fever, new or worsening symptoms   Chest Pain (Nonspecific) It is often hard to give a diagnosis for the cause of chest pain. There is always a chance that your pain could be related to something serious, such as a heart attack or a blood clot in the lungs. You need to follow up with your doctor. HOME CARE  If antibiotic medicine was given, take it as directed by your doctor. Finish the medicine even if you start to feel better.  For the next few days, avoid activities that bring on chest pain. Continue physical activities as told by your doctor.  Do not use any tobacco products. This includes cigarettes, chewing tobacco, and e-cigarettes.  Avoid drinking alcohol.  Only take medicine as told by your doctor.  Follow your doctor's suggestions for more testing if your chest pain does not go away.  Keep all doctor visits you made. GET HELP IF:  Your chest pain does not go away, even after treatment.  You have a rash with blisters on your chest.  You have a fever. GET HELP RIGHT AWAY IF:   You have more pain or pain that spreads to your arm, neck, jaw, back, or belly (abdomen).  You have shortness of breath.  You cough more than usual or cough up blood.  You have very bad back or belly pain.  You feel sick to your stomach (nauseous) or throw up (vomit).  You have very bad weakness.  You pass out (faint).  You have chills. This is an emergency. Do not wait to see if the problems will go away. Call your local emergency services (911 in U.S[place] Do not drive yourself to the hospital. MAKE SURE  YOU:   Understand these instructions.  Will watch your condition.  Will get help right away if you are not doing well or get worse. Document Released: 04/02/2008 Document Revised: 10/20/2013 Document Reviewed: 04/02/2008 [facility] Patient Information 2015 [place], [place]. This information is not intended to replace advice given to you by your health care provider. Make sure you discuss any questions you have with your health care provider.   Emergency Department Resource Guide 1) Find a Doctor and Pay Out of Pocket Although you won't have to find out who is covered by your insurance plan, it is a good idea to ask around and get recommendations. You will then need to call the office and see if the doctor you have chosen will accept you as a new patient and what types of options they offer for patients who are self-pay. Some doctors offer discounts or will set up payment plans for their patients who do not have insurance, but you will need to ask so you aren't surprised when you get to your appointment.  2) Contact Your Local Health Department Not all health departments have doctors that can see patients for sick visits, but many do, so it is worth a call to see if yours does. If you don't know where your local health department is, you can check in your phone book. The CDC also has a tool to help you locate your state's health department,  and many state websites also have listings of all of their local health departments.  3) Find a Walk-in Clinic If your illness is not likely to be very severe or complicated, you may want to try a walk in clinic. These are popping up all over the country in pharmacies, drugstores, and shopping centers. They're usually staffed by nurse practitioners or physician assistants that have been trained to treat common illnesses and complaints. They're usually fairly quick and inexpensive. However, if you have serious medical issues or chronic medical problems, these are  probably not your best option.  No Primary Care Doctor: - Call Health Connect at  [phone] - they can help you locate a primary care doctor that  accepts your insurance, provides certain services, etc. - Physician Referral Service- [phone]  Chronic Pain Problems: Organization         Address  Phone   Notes  [facility]  [phone] Patients need to be referred by their primary care doctor.   Medication Assistance: Organization         Address  Phone   Notes  [facility] Medication [facility] [address] [place] [place], [place] [address] [phone] --Must be a resident of [facility] -- Must have NO insurance coverage whatsoever (no Medicaid/ Medicare, etc.) -- The pt. MUST have a primary care doctor that directs their care regularly and follows them in the community   [facility]ist  [phone]   [employer]  [phone]    Agencies that provide inexpensive medical care: Organization         Address  Phone   Notes  [facility]  [phone]   [facility]    [phone]   [facility] [address] [place], [place] [address] [phone]   [facility] [address] [place], [place] [phone]   Planned Parenthood    [phone]   [facility]    [phone]   [facility] and [facility]  [address], [place] Phone:  [phone], Fax:  [phone] Hours of Operation:  9 am - 6 pm, M-F.  Also accepts Medicaid/Medicare and self-pay.  [facility]  [address], [place] Phone: [phone], Fax: [phone]. Hours of Operation:  8:30 am - 5:30 pm, M-F.  Also accepts Medicaid and self-pay.  [facility] [place] [address] [place] Phone: [phone]   [facility] [address] Natasha Bence [place], [place] [phone], Ext. [phone] Mondays & Thursdays:  7-9 AM.  First 15 patients are seen on a first come, first serve basis.    Medicaid-accepting [facility] Providers:  Organization         Address  Phone   Notes  [facility] [address], [place] [phone] Also accepts self-pay patients.  [facility] [address] Laurell Josephs [place], [place]  [phone]   [facility] [address], [place] [phone]   [facility] [address], [place] [phone]   Renaye Rakers [address], [place]   [phone] Only accepts [place] Access [place] patients after they have their name applied to their card.   Self-Pay (no insurance) in [facility]:  Organization  Address  Phone   Notes  Sickle Cell Patients, [facility] Internal Medicine [place] [phone]   [facility] [place] [phone]   [facility] [place]  [place], [place] [phone]   [facility]/Dr. Osei-Bonsu  [address], [place] or [address], [place] [phone] Phone number for both [place] and [place] locations is the same.  [facility] [address], [place] [phone]   [facility] [address], [place] or [address] [phone]   [facility] [address] [place] [phone], phone; [phone], fax Sees patients 1st and 3rd Saturday of every month.  Must not qualify for public or private insurance (i.e. Medicaid, Medicare, [place] Health Choice, Veterans' Benefits)  Household income should be no more than 200% of the poverty level The clinic cannot treat you if you are pregnant or think you are pregnant  Sexually transmitted diseases are not treated at the clinic.     Dental Care: Organization         Address  Phone  Notes  [facility] Department of [facility] [phone] Accepts children up to age 1 who are enrolled in [place] or [place]; pregnant women with a Medicaid card; and children who have applied for Medicaid or [place] Health Choice, but were declined, whose parents can pay a reduced fee at time of service.  [facility] Department of [facility]  [address], [place] [phone] Accepts children up to age 2 who are enrolled in [place] or [place]; pregnant women with a Medicaid card; and children who have applied for Medicaid or [place] Health Choice, but were declined, whose parents can pay a reduced fee at time of service.  [facility] PROGRAM  [place] [phone] Patients are seen by appointment only. Walk-ins are not accepted. [place] will see patients 86 years of age and older. Monday - Tuesday (8am-5pm) Most Wednesdays (8:30-5pm) $30 per visit, cash only  [facility] Adult Dental Access PROGRAM  [address], [facility] [phone] Patients are seen by appointment only. Walk-ins are not accepted. [place] will see patients 56 years of age and older. One Wednesday Evening (Monthly: Volunteer Based).  $30 per visit, cash only  [place]  [phone] for adults; Children under age 94, call Graduate Pediatric Dentistry at [phone]. Children aged 26-14, please call [phone] to request a pediatric application.  Dental services are provided in all areas of dental care including fillings, crowns and bridges, complete and partial dentures, implants, gum treatment, root canals, and extractions. Preventive care is also provided. Treatment is provided to both adults and children. Patients are selected via a lottery and there is often a waiting  list.   [facility] [address], [place]  [phone] [email or web address]   [facility] [address] [place], [place] [phone], Ext. [phone] Second and Fourth Thursday of each month, opens at 6:30 AM; Clinic ends at 9 AM.  Patients are seen on a first-come first-served basis, and a limited number are seen during each clinic.   [facility]  [address] [place], [place]  [place], [place] [phone]   Eligibility Requirements You must have lived in [place], [place], or [place] counties for at least the last three months.   You cannot be eligible for state or federal sponsored [employer], including [employer], [place], or [employer].   You generally cannot be eligible for healthcare insurance through your employer.    How to apply: Eligibility screenings are held every Tuesday and Wednesday afternoon from 1:00 pm until 4:00 pm. You do not need an appointment for the interview!  [facility] [address], [place], [place]   [place]  [place] Department  [place]  [phone]    Behavioral Health Resources in the Community: Intensive Outpatient Programs Organization         Address  Phone  Notes  Raemon Elkins. [address], [place], [place] [phone]   [facility] Outpatient [address], [place], [place]   ADS: [facility] [address], [place], [place]   [place] [address],  [place], [place] or [phone]   Substance Abuse Resources Organization         Address  Phone  Notes  Al[facility] Drug Services  [phone]   [place]  [phone]   [facility] [place]   Chinita Pester  [phone]   Residential & Outpatient Substance Abuse Program   [phone]   Psychological Services Organization         Address  Phone  Notes  [facility] [place]  [place]  [phone]   [place] [address] [place] or [phone]    Mobile Crisis Teams Organization         Address  Phone  Notes  [facility], Mobile Crisis Care Unit  [phone]   [facility]  [address] [place], [place]   [place] Levels [address] [place] [phone]    Self-Help/Support Groups Organization         Address  Phone             Notes  [place]. of [place] - variety of support groups  [place] Call for more information  Narcotics Anonymous (NA), [facility] [address], [employer] [place]  2 meetings at this location   Special educational needs teacher         Address  Phone  Notes  [facility] [place],    [place]  [phone]   [facility]  [address] [place] [address]7939, [place], [place]   [place]hr [place], [place] [phone] Admissions: 8am-3pm M-F  Incentives Substance [place] [address],    [place], [place] [phone]   [facility] [address] Jadene Pierini [place], [place]   [facility] [address],  [place], [place]   [facility] - Intensive Outpatient [address] Kristeen [address], [place], [place]   [facility] ([place].,  [place], [place] or [phone]   [facility] (RTS) [address] [place], [place] Accepts Medicaid  [facility] [place] [address],  [place] [phone] Substance Abuse/Addiction Treatment   [facility] Resources Organization  Address  Phone  Notes  °[facility]  ([phone]) [phone]   °Julie Brannon, PhD [address] [place], [place]   ([phone]) [phone] or (336) [phone]   ° Behavioral   [address] °[place], [employer] ([phone]) [phone]   °[facility] [address], [place], [place] [phone] Insurance/Medicaid/sponsorship through [facility]  °[facility] [address]                                    [place], [place] [phone] Therapy/tele-psych/case  °[facility] [address]  ° [place], [place] ([phone]) [phone]    °Dr. Arfeen  ([phone]) [phone]   °[facility] [place]  [employer] [place] [facility] 1) [address],  °2) [address], [place] °3)  [address], [place] ([phone]) [phone] °(336) [phone] ° °(336) [phone]   °[place] Child Abuse Hotline (336) 342-1394 or (336) 342-3537 (After Hours)    ° ° ° ° °

## 2015-07-25 ENCOUNTER — Ambulatory Visit (HOSPITAL_COMMUNITY): Payer: Self-pay | Admitting: Psychology

## 2015-08-04 ENCOUNTER — Other Ambulatory Visit (HOSPITAL_COMMUNITY): Payer: Self-pay | Admitting: Psychiatry

## 2015-08-04 ENCOUNTER — Ambulatory Visit (INDEPENDENT_AMBULATORY_CARE_PROVIDER_SITE_OTHER): Payer: BLUE CROSS/BLUE SHIELD | Admitting: Psychology

## 2015-08-04 DIAGNOSIS — F332 Major depressive disorder, recurrent severe without psychotic features: Secondary | ICD-10-CM

## 2015-08-04 DIAGNOSIS — F4310 Post-traumatic stress disorder, unspecified: Secondary | ICD-10-CM

## 2015-08-04 DIAGNOSIS — F331 Major depressive disorder, recurrent, moderate: Secondary | ICD-10-CM

## 2015-08-04 NOTE — Progress Notes (Signed)
   THERAPIST PROGRESS NOTE  Session Time: 12.30pm-1.23pm  Participation Level: Active  Behavioral Response: Well GroomedAlertAnxious and Depressed  Type of Therapy: Individual Therapy  Treatment Goals addressed: Diagnosis: MDD, PTSD and goal 1.  Interventions: CBT and Assertiveness Training  Summary: Beth [name] is a 34 y.o. female who presents with affect congruent w/ report of feeling stressed.  Pt identified goals for her counseling.  Pt reported that she has maintained sobriety.  Pt reported that she is feeling stressed as she is struggling w/ feeling no time to self.  Pt reported that several days after last appointment mom informed that guy she was dating was moving in as they like him and he was looking for a place to stay.  Pt recognized that she wasn't ready for this in relationship as not sure how she feels about him- still getting to know him- but didn't want to say anything against this.  Pt reported that she has joined him to visit his children in [place] and now back a week from that trip. Pt reports not feeling that she has her own space- that she can express her feeling and wants as doesn't want to hurt anyone else's feelings.  Pt recognized that she struggles to set boundaries, assert herself, put others feelings before hers and that results in her later angry and having outburst.  Pt discussed ways can start setting boundaries for time needs for self care independently.    Suicidal/Homicidal: Nowithout intent/plan  Therapist Response: Assessed pt current functioning per pt report.  Developed tx plan w/ pt based on her goals for self.  Explored w/ pt her feelings about current relationship and not communicating her feelings.  Discussed importance for her to assert self to advocate for self in healthy ways and for emotional regulation.  Explored w/pt ways she can begin establishing boundaries for own time.    Plan: Return again in 2 weeks.  Diagnosis: MDD,  PTSD    [place] Peplinski, LPC 08/04/2015

## 2015-08-05 ENCOUNTER — Other Ambulatory Visit (HOSPITAL_COMMUNITY): Payer: Self-pay | Admitting: Psychiatry

## 2015-08-05 ENCOUNTER — Telehealth (HOSPITAL_COMMUNITY): Payer: Self-pay

## 2015-08-05 MED ORDER — SUVOREXANT 15 MG PO TABS: 15.0000 mg | ORAL_TABLET | Freq: Every day | ORAL | Status: DC

## 2015-08-05 NOTE — Telephone Encounter (Signed)
Medication management - Attempted to reach patient to follow up on message left for call back two times this date but no answer and voicemail could not take messages at this time.

## 2015-08-05 NOTE — Telephone Encounter (Signed)
Telephone call with Dr. Lolly Mustache covering Beth [name] Emergency Department this date, to inform patient called requesting refills of her prescribed Belsomra and Ativan.  Dr. Lolly Mustache authorized a one time refill of each to be called into [facility]'s [facility].  [facility] on [address] and spoke with Zollie Scale, pharmacist to give new one time refill orders for patient's Belsomra and Ativan per Dr.Arfeen instruction.  Called patient back to inform new orders were called in for one time refills of her Belsomra and Ativan as requested this date to [facility] on [address] and reminded patient of need to keep appointment on 08/24/15.

## 2015-08-06 ENCOUNTER — Other Ambulatory Visit (HOSPITAL_COMMUNITY): Payer: Self-pay | Admitting: Psychiatry

## 2015-08-08 ENCOUNTER — Other Ambulatory Visit (HOSPITAL_COMMUNITY): Payer: Self-pay | Admitting: Psychiatry

## 2015-08-10 ENCOUNTER — Other Ambulatory Visit (HOSPITAL_COMMUNITY): Payer: Self-pay | Admitting: Psychiatry

## 2015-08-10 NOTE — Telephone Encounter (Signed)
appt on 08/24/15

## 2015-08-15 ENCOUNTER — Encounter (HOSPITAL_COMMUNITY): Payer: Self-pay | Admitting: Emergency Medicine

## 2015-08-15 ENCOUNTER — Emergency Department (HOSPITAL_COMMUNITY)
Admission: EM | Admit: 2015-08-15 | Discharge: 2015-08-16 | Disposition: A | Discharge status: Other Acute Inpatient Hospital | Payer: BLUE CROSS/BLUE SHIELD | Attending: Emergency Medicine | Admitting: Emergency Medicine

## 2015-08-15 DIAGNOSIS — G43909 Migraine, unspecified, not intractable, without status migrainosus: Secondary | ICD-10-CM | POA: Diagnosis not present

## 2015-08-15 DIAGNOSIS — F419 Anxiety disorder, unspecified: Secondary | ICD-10-CM | POA: Diagnosis not present

## 2015-08-15 DIAGNOSIS — K219 Gastro-esophageal reflux disease without esophagitis: Secondary | ICD-10-CM | POA: Diagnosis not present

## 2015-08-15 DIAGNOSIS — T424X1A Poisoning by benzodiazepines, accidental (unintentional), initial encounter: Secondary | ICD-10-CM | POA: Diagnosis present

## 2015-08-15 DIAGNOSIS — R45851 Suicidal ideations: Secondary | ICD-10-CM | POA: Diagnosis not present

## 2015-08-15 DIAGNOSIS — Z79899 Other long term (current) drug therapy: Secondary | ICD-10-CM | POA: Insufficient documentation

## 2015-08-15 DIAGNOSIS — J45909 Unspecified asthma, uncomplicated: Secondary | ICD-10-CM | POA: Insufficient documentation

## 2015-08-15 DIAGNOSIS — R4689 Other symptoms and signs involving appearance and behavior: Secondary | ICD-10-CM

## 2015-08-15 DIAGNOSIS — Z3202 Encounter for pregnancy test, result negative: Secondary | ICD-10-CM | POA: Diagnosis not present

## 2015-08-15 DIAGNOSIS — G4700 Insomnia, unspecified: Secondary | ICD-10-CM | POA: Insufficient documentation

## 2015-08-15 DIAGNOSIS — F329 Major depressive disorder, single episode, unspecified: Secondary | ICD-10-CM | POA: Insufficient documentation

## 2015-08-15 DIAGNOSIS — Z720 Tobacco use: Secondary | ICD-10-CM | POA: Diagnosis not present

## 2015-08-15 DIAGNOSIS — G8929 Other chronic pain: Secondary | ICD-10-CM | POA: Diagnosis not present

## 2015-08-15 DIAGNOSIS — R4589 Other symptoms and signs involving emotional state: Secondary | ICD-10-CM

## 2015-08-15 LAB — COMPREHENSIVE METABOLIC PANEL
ALBUMIN: 3.8 g/dL (ref 3.5–5.0)
ALT: 13 U/L — ABNORMAL LOW (ref 14–54)
AST: 16 U/L (ref 15–41)
Alkaline Phosphatase: 69 U/L (ref 38–126)
Anion gap: 9 (ref 5–15)
CHLORIDE: 100 mmol/L — AB (ref 101–111)
CO2: 23 mmol/L (ref 22–32)
Calcium: 8.8 mg/dL — ABNORMAL LOW (ref 8.9–10.3)
Creatinine, Ser: 0.91 mg/dL (ref 0.44–1.00)
GFR calc Af Amer: >60 mL/min (ref >60)
GFR calc non Af Amer: >60 mL/min (ref >60)
GLUCOSE: 91 mg/dL (ref 65–99)
POTASSIUM: 3.8 mmol/L (ref 3.5–5.1)
SODIUM: 132 mmol/L — AB (ref 135–145)
Total Bilirubin: 0.3 mg/dL (ref 0.3–1.2)
Total Protein: 7.1 g/dL (ref 6.5–8.1)

## 2015-08-15 LAB — CBC
HEMATOCRIT: 42 % (ref 36.0–46.0)
HEMOGLOBIN: 14.1 g/dL (ref 12.0–15.0)
MCH: 29.1 pg (ref 26.0–34.0)
MCHC: 33.6 g/dL (ref 30.0–36.0)
MCV: 86.8 fL (ref 78.0–100.0)
PLATELETS: 349 10*3/uL (ref 150–400)
RBC: 4.84 MIL/uL (ref 3.87–5.11)
RDW: 13.9 % (ref 11.5–15.5)
WBC: 8.5 10*3/uL (ref 4.0–10.5)

## 2015-08-15 LAB — RAPID URINE DRUG SCREEN, HOSP PERFORMED
AMPHETAMINES: NOT DETECTED
BENZODIAZEPINES: NOT DETECTED
Barbiturates: NOT DETECTED
Cocaine: NOT DETECTED
Opiates: NOT DETECTED
Tetrahydrocannabinol: NOT DETECTED

## 2015-08-15 LAB — I-STAT BETA HCG BLOOD, ED (MC, WL, AP ONLY)

## 2015-08-15 LAB — ETHANOL: ALCOHOL ETHYL (B): 204 mg/dL — AB (ref <5)

## 2015-08-15 LAB — SALICYLATE LEVEL: Salicylate Lvl: <4 mg/dL (ref 2.8–30.0)

## 2015-08-15 LAB — ACETAMINOPHEN LEVEL: Acetaminophen (Tylenol), Serum: <10 ug/mL — ABNORMAL LOW (ref 10–30)

## 2015-08-15 MED ORDER — ONDANSETRON HCL 4 MG PO TABS: 4.0000 mg | ORAL_TABLET | Freq: Three times a day (TID) | ORAL | Status: DC | PRN

## 2015-08-15 MED ORDER — IBUPROFEN 400 MG PO TABS: 600.0000 mg | ORAL_TABLET | Freq: Three times a day (TID) | ORAL | Status: DC | PRN

## 2015-08-15 MED ORDER — LORAZEPAM 1 MG PO TABS: 0.0000 mg | ORAL_TABLET | Freq: Four times a day (QID) | ORAL | Status: DC

## 2015-08-15 MED ORDER — THIAMINE HCL 100 MG/ML IJ SOLN: 100.0000 mg | Freq: Every day | INTRAMUSCULAR | Status: DC

## 2015-08-15 MED ORDER — ACETAMINOPHEN 325 MG PO TABS: 650.0000 mg | ORAL_TABLET | ORAL | Status: DC | PRN

## 2015-08-15 MED ORDER — VITAMIN B-1 100 MG PO TABS: 100.0000 mg | ORAL_TABLET | Freq: Every day | ORAL | Status: DC

## 2015-08-15 MED ORDER — NICOTINE 21 MG/24HR TD PT24: 21.0000 mg | MEDICATED_PATCH | Freq: Every day | TRANSDERMAL | Status: DC | PRN

## 2015-08-15 MED ORDER — LORAZEPAM 1 MG PO TABS: 0.0000 mg | ORAL_TABLET | Freq: Two times a day (BID) | ORAL | Status: DC

## 2015-08-15 MED ORDER — ZOLPIDEM TARTRATE 5 MG PO TABS: 5.0000 mg | ORAL_TABLET | Freq: Every evening | ORAL | Status: DC | PRN
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
## 2015-08-15 NOTE — ED Notes (Signed)
Staffing Notified for sitter.

## 2015-08-15 NOTE — ED Notes (Signed)
Sitter at bedside.

## 2015-08-15 NOTE — ED Notes (Addendum)
Pt reports that she is an alcoholic and tonight she got aggitated because someone told her she had more to drink than she had. Pt then took 10 (1mg ) ativan pta in attempt to harm herself. Family at bedside.

## 2015-08-16 ENCOUNTER — Encounter (HOSPITAL_COMMUNITY): Payer: Self-pay | Admitting: *Deleted

## 2015-08-16 ENCOUNTER — Inpatient Hospital Stay (HOSPITAL_COMMUNITY)
Admission: EM | Admit: 2015-08-16 | Discharge: 2015-08-16 | DRG: 897 | Disposition: A | Discharge status: Home / Self Care | Payer: BLUE CROSS/BLUE SHIELD | Source: Intra-hospital | Attending: Psychiatry | Admitting: Psychiatry

## 2015-08-16 DIAGNOSIS — F410 Panic disorder [episodic paroxysmal anxiety] without agoraphobia: Secondary | ICD-10-CM | POA: Diagnosis present

## 2015-08-16 DIAGNOSIS — F1020 Alcohol dependence, uncomplicated: Secondary | ICD-10-CM | POA: Diagnosis not present

## 2015-08-16 DIAGNOSIS — F1994 Other psychoactive substance use, unspecified with psychoactive substance-induced mood disorder: Secondary | ICD-10-CM

## 2015-08-16 DIAGNOSIS — F1014 Alcohol abuse with alcohol-induced mood disorder: Secondary | ICD-10-CM | POA: Diagnosis present

## 2015-08-16 DIAGNOSIS — Y907 Blood alcohol level of 200-239 mg/100 ml: Secondary | ICD-10-CM | POA: Diagnosis present

## 2015-08-16 DIAGNOSIS — Z833 Family history of diabetes mellitus: Secondary | ICD-10-CM

## 2015-08-16 DIAGNOSIS — Z803 Family history of malignant neoplasm of breast: Secondary | ICD-10-CM | POA: Diagnosis not present

## 2015-08-16 DIAGNOSIS — Z9884 Bariatric surgery status: Secondary | ICD-10-CM | POA: Diagnosis not present

## 2015-08-16 DIAGNOSIS — Z811 Family history of alcohol abuse and dependence: Secondary | ICD-10-CM | POA: Diagnosis not present

## 2015-08-16 DIAGNOSIS — G35 Multiple sclerosis: Secondary | ICD-10-CM | POA: Diagnosis present

## 2015-08-16 DIAGNOSIS — F332 Major depressive disorder, recurrent severe without psychotic features: Secondary | ICD-10-CM | POA: Diagnosis present

## 2015-08-16 DIAGNOSIS — Z825 Family history of asthma and other chronic lower respiratory diseases: Secondary | ICD-10-CM | POA: Diagnosis not present

## 2015-08-16 DIAGNOSIS — M797 Fibromyalgia: Secondary | ICD-10-CM | POA: Diagnosis present

## 2015-08-16 DIAGNOSIS — T424X1A Poisoning by benzodiazepines, accidental (unintentional), initial encounter: Secondary | ICD-10-CM | POA: Diagnosis not present

## 2015-08-16 DIAGNOSIS — F4310 Post-traumatic stress disorder, unspecified: Secondary | ICD-10-CM | POA: Diagnosis present

## 2015-08-16 DIAGNOSIS — Z8249 Family history of ischemic heart disease and other diseases of the circulatory system: Secondary | ICD-10-CM

## 2015-08-16 DIAGNOSIS — Z823 Family history of stroke: Secondary | ICD-10-CM | POA: Diagnosis not present

## 2015-08-16 DIAGNOSIS — Z818 Family history of other mental and behavioral disorders: Secondary | ICD-10-CM | POA: Diagnosis not present

## 2015-08-16 DIAGNOSIS — G4700 Insomnia, unspecified: Secondary | ICD-10-CM | POA: Diagnosis present

## 2015-08-16 DIAGNOSIS — Z800 Family history of malignant neoplasm of digestive organs: Secondary | ICD-10-CM | POA: Diagnosis not present

## 2015-08-16 DIAGNOSIS — F17210 Nicotine dependence, cigarettes, uncomplicated: Secondary | ICD-10-CM | POA: Diagnosis present

## 2015-08-16 MED ORDER — ADULT MULTIVITAMIN W/MINERALS CH: 1.0000 | ORAL_TABLET | Freq: Every day | ORAL | Status: DC

## 2015-08-16 MED ORDER — MAGNESIUM HYDROXIDE 400 MG/5ML PO SUSP: 30.0000 mL | Freq: Every day | ORAL | Status: DC | PRN

## 2015-08-16 MED ORDER — TRAZODONE HCL 50 MG PO TABS: 50.0000 mg | ORAL_TABLET | Freq: Every evening | ORAL | Status: DC | PRN

## 2015-08-16 MED ORDER — METHOCARBAMOL 500 MG PO TABS: 500.0000 mg | ORAL_TABLET | Freq: Two times a day (BID) | ORAL | Status: DC | PRN

## 2015-08-16 MED ORDER — ACETAMINOPHEN 500 MG PO TABS: 500.0000 mg | ORAL_TABLET | Freq: Four times a day (QID) | ORAL | Status: DC | PRN

## 2015-08-16 MED ORDER — LAMOTRIGINE 200 MG PO TABS: 200.0000 mg | ORAL_TABLET | Freq: Every day | ORAL | Status: DC

## 2015-08-16 MED ORDER — METHOCARBAMOL 500 MG PO TABS
500.0000 mg | ORAL_TABLET | Freq: Two times a day (BID) | ORAL | Status: DC
  Administered 2015-08-16: 500 mg via ORAL
  Filled 2015-08-16 (×6): qty 1

## 2015-08-16 MED ORDER — FLUOXETINE HCL 40 MG PO CAPS: 40.0000 mg | ORAL_CAPSULE | Freq: Every day | ORAL | Status: DC

## 2015-08-16 MED ORDER — GABAPENTIN 400 MG PO CAPS: 400.0000 mg | ORAL_CAPSULE | Freq: Four times a day (QID) | ORAL | Status: DC

## 2015-08-16 MED ORDER — METHOCARBAMOL 500 MG PO TABS
500.0000 mg | ORAL_TABLET | Freq: Two times a day (BID) | ORAL | Status: DC | PRN
  Filled 2015-08-16: qty 10

## 2015-08-16 MED ORDER — ADULT MULTIVITAMIN W/MINERALS CH
1.0000 | ORAL_TABLET | Freq: Every day | ORAL | Status: DC
  Administered 2015-08-16: 1 via ORAL
  Filled 2015-08-16 (×4): qty 1

## 2015-08-16 MED ORDER — TRAZODONE HCL 50 MG PO TABS
50.0000 mg | ORAL_TABLET | Freq: Every evening | ORAL | Status: DC | PRN
  Filled 2015-08-16: qty 14
  Filled 2015-08-16: qty 1
  Filled 2015-08-16: qty 14
  Filled 2015-08-16: qty 1

## 2015-08-16 MED ORDER — GABAPENTIN 400 MG PO CAPS
400.0000 mg | ORAL_CAPSULE | Freq: Four times a day (QID) | ORAL | Status: DC
  Administered 2015-08-16 (×2): 400 mg via ORAL
  Filled 2015-08-16: qty 1
  Filled 2015-08-16: qty 28
  Filled 2015-08-16 (×2): qty 1
  Filled 2015-08-16 (×3): qty 28
  Filled 2015-08-16 (×3): qty 1

## 2015-08-16 MED ORDER — PRAZOSIN HCL 2 MG PO CAPS: 2.0000 mg | ORAL_CAPSULE | Freq: Every day | ORAL | Status: DC

## 2015-08-16 MED ORDER — FLUOXETINE HCL 20 MG PO CAPS
40.0000 mg | ORAL_CAPSULE | Freq: Every day | ORAL | Status: DC
  Filled 2015-08-16: qty 14
  Filled 2015-08-16: qty 2

## 2015-08-16 MED ORDER — LAMOTRIGINE 100 MG PO TABS
200.0000 mg | ORAL_TABLET | Freq: Every day | ORAL | Status: DC
  Administered 2015-08-16: 200 mg via ORAL
  Filled 2015-08-16: qty 2
  Filled 2015-08-16: qty 14
  Filled 2015-08-16 (×2): qty 1

## 2015-08-16 MED ORDER — SUMATRIPTAN SUCCINATE 50 MG PO TABS: 50.0000 mg | ORAL_TABLET | ORAL | Status: DC | PRN

## 2015-08-16 MED ORDER — SUMATRIPTAN SUCCINATE 50 MG PO TABS
50.0000 mg | ORAL_TABLET | Freq: Once | ORAL | Status: AC
  Administered 2015-08-16: 50 mg via ORAL
  Filled 2015-08-16: qty 1

## 2015-08-16 MED ORDER — PRAZOSIN HCL 2 MG PO CAPS
2.0000 mg | ORAL_CAPSULE | Freq: Every day | ORAL | Status: DC
  Filled 2015-08-16: qty 1
  Filled 2015-08-16: qty 7

## 2015-08-16 MED ORDER — PANTOPRAZOLE SODIUM 40 MG PO TBEC
40.0000 mg | DELAYED_RELEASE_TABLET | Freq: Every day | ORAL | Status: DC
  Administered 2015-08-16: 40 mg via ORAL
  Filled 2015-08-16: qty 1
  Filled 2015-08-16: qty 7
  Filled 2015-08-16 (×2): qty 1

## 2015-08-16 MED ORDER — ALUM & MAG HYDROXIDE-SIMETH 200-200-20 MG/5ML PO SUSP: 30.0000 mL | ORAL | Status: DC | PRN

## 2015-08-16 MED ORDER — NICOTINE POLACRILEX 2 MG MT GUM: 2.0000 mg | CHEWING_GUM | OROMUCOSAL | Status: DC | PRN

## 2015-08-16 NOTE — [facility] Counselor (Signed)
Pt admitted for less than 12 hours. No PSA needed.  Trula Slade, LCSWA Clinical Social Worker 08/16/2015 10:07 AM

## 2015-08-16 NOTE — ED Notes (Signed)
Unable to get in touch with Terri regarding papers that were to be faxed here (ext [phone])

## 2015-08-16 NOTE — BH Assessment (Signed)
Tele Assessment Note   Beth [name] is a 34 y.o. female who voluntarily presents to [facility] with SI/SA and depression. Pt told the medical staff that she is an alcoholic and she became agitated because her mother discussed her about drinking too much.  She then became upset and overdosed on 12-1mg  ativan pills and drank 1.5-24oz beers.  Pt states that she's been SI x81month and her thoughts are triggered by financial/employment issues and problems with her step father.  Pt says she drinks 2-16oz weekly.  Pt is drowsy during interview but manages to engage with this Clinical research associate.  This Clinical research associate discussed disposition with Donell Sievert, PA who recommends inpt admission.   Diagnosis: Axis I: 296.33 Major depressive disorder, Recurrent episode, Severe; 303.9 Alcohol use disorder, Moderate   Past Medical History:  Past Medical History  Diagnosis Date  . Acid reflux   . Asthma   . History of blood transfusion   . Multiple sclerosis (HCC) 2005    diagnosed in [place], failed steroids, no other medication  . Anxiety   . Gastric ulcer   . Migraine   . Chronic nausea     self reported  . Chronic diarrhea     self reported  . Chronic pain     self reported chronic pain, back pain, fibromyalgia  . Insomnia   . Polysubstance abuse   . ETOH abuse   . Delirium tremens (HCC)   . Depression     Past Surgical History  Procedure Laterality Date  . Gastric bypass    . Diagnostic laparoscopy  2003    repair of perforation at [facility] anastamosis   . Lumbar laminectomy/decompression microdiscectomy Right 04/14/2014    Procedure: LUMBAR LAMINECTOMY/DECOMPRESSION MICRODISCECTOMY LEVEL L4-5;  Surgeon: Maeola Harman, MD;  Location: [facility];  Service: Neurosurgery;  Laterality: Right;  LUMBAR LAMINECTOMY/DECOMPRESSION MICRODISCECTOMY LEVEL L4-5  . Laparotomy N/A 08/18/2014    Procedure: EXPLORATORY LAPAROTOMY, Patch graft of gastric perforation;  Surgeon: Abigail Miyamoto, MD;  Location: [facility] OR;  Service: General;   Laterality: N/A;    Family History:  Family History  Problem Relation Age of Onset  . Hypertension Paternal Grandfather   . Colon cancer Paternal Grandfather 23  . Asthma Paternal Grandmother   . Hypertension Paternal Grandmother   . Breast cancer Paternal Grandmother   . Cancer Paternal Grandmother     lung   . Diabetes Maternal Grandmother   . Diabetes Maternal Grandfather   . Hypertension Father   . Alcohol abuse Father   . Urinary tract infection Mother   . Stroke Mother 35    3 strokes     Social History:  reports that she has been smoking Cigarettes.  She has been smoking about 0.50 packs per day. She has never used smokeless tobacco. She reports that she does not drink alcohol or use illicit drugs.  Additional Social History:  Alcohol / Drug Use Pain Medications: See MAR  Prescriptions: See MAR  Over the Counter: See MAR  History of alcohol / drug use?: Yes Longest period of sobriety (when/how long): Inpt admission Negative Consequences of Use: Work / Programmer, multimedia, Copywriter, advertising relationships, Armed forces operational officer, Surveyor, quantity Withdrawal Symptoms: Other (Comment) (No w/d sxs ) Substance #1 Name of Substance 1: Alcohol  1 - Age of First Use: Teens  1 - Amount (size/oz): 2-16oz  1 - Frequency: Weekly  1 - Duration: On-going  1 - Last Use / Amount: 08/16/15  CIWA: CIWA-Ar BP: (!) 101/52 mmHg Pulse Rate: 89 Nausea and Vomiting:  mild nausea with no vomiting Tactile Disturbances: none Tremor: no tremor Auditory Disturbances: not present Paroxysmal Sweats: no sweat visible Visual Disturbances: not present Anxiety: no anxiety, at ease Headache, Fullness in Head: severe (history of migraines) Agitation: normal activity Orientation and Clouding of Sensorium: oriented and can do serial additions CIWA-Ar Total: 6 COWS:    PATIENT STRENGTHS: (choose at least two) Capable of independent living Motivation for treatment/growth  Allergies:  Allergies  Allergen Reactions  . Cymbalta  [Duloxetine Hcl] Other (See Comments)    Made body jerk and twitch, spasms  . Dilaudid [Hydromorphone Hcl] Other (See Comments)    Anger, aggression   . Naltrexone Swelling  . Prednisone Other (See Comments)    Steroids in general    Home Medications:  (Not in a hospital admission)  OB/GYN Status:  Patient's last menstrual period was 07/18/2015.  General Assessment Data Location of Assessment: [facility] ED TTS Assessment: In system Is this a Tele or Face-to-Face Assessment?: Tele Assessment Is this an Initial Assessment or a Re-assessment for this encounter?: Initial Assessment Marital status: Single Maiden name: None  Is patient pregnant?: No Pregnancy Status: No Living Arrangements: Parent Can pt return to current living arrangement?: Yes Admission Status: Voluntary Is patient capable of signing voluntary admission?: Yes Referral Source: MD Insurance type: BCBS  Medical Screening Exam [facility] Walk-in ONLY) Medical Exam completed: No Reason for MSE not completed: Other:  Crisis Care Plan Living Arrangements: Parent Name of Psychiatrist: Dr.; Lolly Mustache  Name of Therapist: Adella Hare   Education Status Is patient currently in school?: No Current Grade: None  Highest grade of school patient has completed: None  Name of school: None  Contact person: None   Risk to self with the past 6 months Suicidal Ideation: Yes-Currently Present Has patient been a risk to self within the past 6 months prior to admission? : Yes Suicidal Intent: Yes-Currently Present Has patient had any suicidal intent within the past 6 months prior to admission? : Yes Is patient at risk for suicide?: Yes Suicidal Plan?: Yes-Currently Present Has patient had any suicidal plan within the past 6 months prior to admission? : Yes Specify Current Suicidal Plan: Overdose  Access to Means: Yes Specify Access to Suicidal Means: Pills, Rx's  What has been your use of drugs/alcohol within the last 12 months?: Abusing  Alcohol  Previous Attempts/Gestures: No How many times?: 0 Other Self Harm Risks: None  Triggers for Past Attempts: None known Intentional Self Injurious Behavior: None Family Suicide History: No Recent stressful life event(s): Other (Comment) (Pls see EPIC Note ) Persecutory voices/beliefs?: No Depression: Yes Depression Symptoms: Loss of interest in usual pleasures, Feeling worthless/self pity Substance abuse history and/or treatment for substance abuse?: Yes Suicide prevention information given to non-admitted patients: Not applicable  Risk to Others within the past 6 months Homicidal Ideation: No Does patient have any lifetime risk of violence toward others beyond the six months prior to admission? : No Thoughts of Harm to Others: No Current Homicidal Intent: No Current Homicidal Plan: No Access to Homicidal Means: No Identified Victim: None  History of harm to others?: No Assessment of Violence: None Noted Violent Behavior Description: None  Does patient have access to weapons?: No Criminal Charges Pending?: Yes Describe Pending Criminal Charges: DUI Does patient have a court date: No Is patient on probation?: No  Psychosis Hallucinations: None noted Delusions: None noted  Mental Status Report Appearance/Hygiene: Disheveled, In scrubs Eye Contact: Fair Motor Activity: Unremarkable Speech: Logical/coherent, Slurred, Slow Level  of Consciousness: Drowsy Mood: Depressed Affect: Depressed Anxiety Level: None Thought Processes: Coherent, Relevant Judgement: Impaired Orientation: Person, Place, Time, Situation Obsessive Compulsive Thoughts/Behaviors: None  Cognitive Functioning Concentration: Decreased Memory: Recent Intact, Remote Intact IQ: Average Insight: Poor Impulse Control: Poor Appetite: Fair Weight Loss: 0 Weight Gain: 0 Sleep: Decreased Total Hours of Sleep: 3 Vegetative Symptoms: None  ADLScreening [facility] Assessment Services) Patient's cognitive  ability adequate to safely complete daily activities?: Yes Patient able to express need for assistance with ADLs?: Yes Independently performs ADLs?: Yes (appropriate for developmental age)  Prior Inpatient Therapy Prior Inpatient Therapy: Yes Prior Therapy Dates: 2016 Prior Therapy Facilty/Provider(s): [facility], [facility] Reason for Treatment: SI/Depression/SA  Prior Outpatient Therapy Prior Outpatient Therapy: Yes Prior Therapy Dates: Current  Prior Therapy Facilty/Provider(s): Dr Salome Arnt  Reason for Treatment: Med mgt/Therapy  Does patient have an ACCT team?: No Does patient have Intensive In-House Services?  : No Does patient have [facility] services? : No Does patient have P4CC services?: No  ADL Screening (condition at time of admission) Patient's cognitive ability adequate to safely complete daily activities?: Yes Is the patient deaf or have difficulty hearing?: No Does the patient have difficulty seeing, even when wearing glasses/contacts?: No Does the patient have difficulty concentrating, remembering, or making decisions?: No Patient able to express need for assistance with ADLs?: Yes Does the patient have difficulty dressing or bathing?: No Independently performs ADLs?: Yes (appropriate for developmental age) Does the patient have difficulty walking or climbing stairs?: No Weakness of Legs: None Weakness of Arms/Hands: None  Home Assistive Devices/Equipment Home Assistive Devices/Equipment: None  Therapy Consults (therapy consults require a physician order) PT Evaluation Needed: No OT Evalulation Needed: No SLP Evaluation Needed: No Abuse/Neglect Assessment (Assessment to be complete while patient is alone) Physical Abuse: Denies Verbal Abuse: Denies Sexual Abuse: Denies Exploitation of patient/patient's resources: Denies Self-Neglect: Denies Values / Beliefs Cultural Requests During Hospitalization: None Spiritual Requests During Hospitalization:  None Consults Spiritual Care Consult Needed: No Social Work Consult Needed: No Merchant navy officer (For Healthcare) Does patient have an advance directive?: No Would patient like information on creating an advanced directive?: No - patient declined information    Additional Information 1:1 In Past 12 Months?: No CIRT Risk: No Elopement Risk: No Does patient have medical clearance?: Yes     Disposition:  Disposition Initial Assessment Completed for this Encounter: Yes Disposition of Patient: Inpatient treatment program, Referred to (Per Donell Sievert, PA meets criteria for inpt admission ) Type of inpatient treatment program: Adult Patient referred to: Other (Comment) (Per Donell Sievert, PA meets criteria for inpt admission )  Murrell Redden 08/16/2015 3:08 AM

## 2015-08-16 NOTE — Progress Notes (Signed)
Pt cooperative and actively participating in group activities.  Self Inventory:  Reports depression 8/10, hopelessness 0/10 and anxiety 9/10 this morning. Pt asking to be discharged home today.  Denies SI/HI and AV hallucinations.  Pt discharged home with boyfriend.  Pt was stable and appreciative at time of discharge.  All discharge paperwork, prescriptions were given and valuables returned.  Verbal understanding expressed, pt given opportunity to express concerns and ask questions.
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
## 2015-08-16 NOTE — ED Notes (Signed)
Clothing inventoried and place in the holding room.  Cell phone to security to lock up

## 2015-08-16 NOTE — Progress Notes (Signed)
33 year old female pt admitted on voluntary basis. Pt reports that she got into an argument with her mother, who she says is accusing her of drinking again. Pt then said she told her boyfriend to bring her into the hospital. Pt then reports that while on the way to the hospital she took an overdose of 10 ativan in a suicide attempt. Pt did endorse that she did this intentionally to kill herself. Pt is able to contract for safety in the hospital. Pt reports that her mother does not trust her and feels that she is going back to do the same things that she used to do. Pt does report that she lives with her parnets and her boyfriend and plans to go back there after discharge. Pt was oriented to the unit and safety maintained.

## 2015-08-16 NOTE — ED Notes (Signed)
Patient states she cannot take Tylenol or Motrin because "it burns a hole" in her stomach  MD aware
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
## 2015-08-16 NOTE — [facility] Suicide Risk Assessment (Signed)
[facility] INPATIENT:  Family/Significant Other Suicide Prevention Education  Suicide Prevention Education:  Patient Refusal for Family/Significant Other Suicide Prevention Education: The patient Beth [name] has refused to provide written consent for family/significant other to be provided Family/Significant Other Suicide Prevention Education during admission and/or prior to discharge.  Physician notified.  SPE completed with pt, as pt refused to consent to family contact. SPI pamphlet provided to pt and pt was encouraged to share information with support network, ask questions, and talk about any concerns relating to SPE. Pt denies access to guns/firearms and verbalized understanding of information provided. Mobile Crisis information also provided to pt.   Smart, Lyon Dumont LCSWA 08/16/2015, 10:06 AM

## 2015-08-16 NOTE — [facility] Group Notes (Signed)
[facility] Group Notes:  Recovery  Date:  08/16/2015  Time:  9:47 AM  Type of Therapy:  Nurse Education  Participation Level:  Active  Participation Quality:  Appropriate  Affect:  Appropriate  Cognitive:  Appropriate  Insight:  Appropriate  Engagement in Group:  Engaged  Modes of Intervention:  Discussion  Summary of Progress/Problems:Pt stated she is ready for discharge and last time she was here wrote down 144 things she likes about herself.  Rodman Key [facility] 08/16/2015, 9:47 AM
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
## 2015-08-16 NOTE — Plan of Care (Signed)
Problem: Diagnosis: Increased Risk For Suicide Attempt Goal: LTG-Patient Family Informed of Increased Suicide Risk LTG (by discharge) Patient's family or significant other informed of patient's increased risk for suicide and they will be able to verbalize ways they can help the patient stay safe after discharge.  Outcome: Completed/Met Date Met:  08/16/15 Pt denies suicidal ideations.

## 2015-08-16 NOTE — Discharge Summary (Signed)
Physician Discharge Summary Note  Patient:  Beth [name] is an 34 y.o., female MRN:  [id number] DOB:  [birth year] Patient phone:  [phone] (home)  Patient address:   [address] [place] [address],  Total Time spent with patient: 30 minutes  Date of Admission:  08/16/2015 Date of Discharge: 08/16/2015  Reason for Admission:  Substance abuse  Principal Problem: Substance induced mood disorder [facility]) Discharge Diagnoses: Patient Active Problem List   Diagnosis Date Noted  . PTSD (post-traumatic stress disorder) [F43.10] 07/11/2015  . Panic disorder without agoraphobia with severe panic attacks [F41.0] 05/26/2015  . Major depressive disorder, recurrent episode ([place]) [F33.9] 05/23/2015  . Suicidal ideations [R45.851]   . Major depressive disorder, recurrent, severe without psychotic features ([place]) [F33.2]   . Polysubstance dependence ([place]) [F19.20]   . Substance induced mood disorder ([place]) [F19.94]   . Substance abuse [F19.10] 03/11/2015  . 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 Alcohol use disorder, moderate, dependence ([place]) [F10.20] 01/13/2015  . Free intraperitoneal air [K66.8] 08/18/2014  . Perforated ulcer ([place]) [K27.5] 08/18/2014  . 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 Acid reflux [K21.9]   . History of blood transfusion [Z92.89]   . Multiple sclerosis ([place]) [G35] 03/13/2012  . H/O gastric bypass [Z98.890] 03/13/2012    Musculoskeletal: Strength & Muscle Tone: within normal limits Gait & Station: normal Patient leans: N/A  Psychiatric Specialty Exam:  SEE MD SRA Physical Exam  Vitals reviewed. Psychiatric: Her affect is not angry. Thought content is not paranoid and not delusional. She does not exhibit a depressed mood. She expresses no homicidal and no suicidal ideation. She expresses no suicidal plans and no homicidal plans.     Review of Systems  All other systems reviewed and are negative.   Blood pressure 121/87, pulse 84, temperature 97.8 F (36.6 C), temperature source Oral, resp. rate 16, height 5' 6"  (1.676 m), weight 116.574 kg (257 lb), last menstrual period 07/18/2015.Body mass index is 41.5 kg/(m^2).  Have you used any form of tobacco in the last 30 days? (Cigarettes, Smokeless Tobacco, Cigars, and/or Pipes): Yes  Has this patient used any form of tobacco in the last 30 days? (Cigarettes, Smokeless Tobacco, Cigars, and/or Pipes) N/A  Past Medical History:  Past Medical History  Diagnosis Date  . Acid reflux   . Asthma   . History of blood transfusion   . Multiple sclerosis ([place]) 2005    diagnosed in [place], failed steroids, no other medication  . Anxiety   . Gastric ulcer   . Migraine   . Chronic nausea     self reported  . Chronic diarrhea     self reported  . Chronic pain     self reported chronic pain, back pain, fibromyalgia  . Insomnia   . Polysubstance abuse   . ETOH abuse   . Delirium tremens ([place])   . Depression     Past Surgical History  Procedure Laterality Date  . Gastric bypass    . Diagnostic laparoscopy  2003    repair of perforation at [place] anastamosis   . Lumbar laminectomy/decompression microdiscectomy Right 04/14/2014    Procedure: LUMBAR LAMINECTOMY/DECOMPRESSION MICRODISCECTOMY LEVEL L4-5;  Surgeon: Erline Levine, MD;  Location: [place] [facility];  Service: Neurosurgery;  Laterality: Right;  LUMBAR LAMINECTOMY/DECOMPRESSION MICRODISCECTOMY LEVEL L4-5  . Laparotomy N/A 08/18/2014    Procedure: EXPLORATORY LAPAROTOMY, Patch graft of gastric perforation;  Surgeon: Coralie Keens,  MD;  Location: [facility];  Service: General;  Laterality: N/A;   Family History:  Family History  Problem Relation Age of Onset  . Hypertension Paternal Grandfather   . Colon cancer Paternal Grandfather 40  . Asthma Paternal Grandmother   . Hypertension Paternal Grandmother   . Breast cancer Paternal  Grandmother   . Cancer Paternal Grandmother     lung   . Diabetes Maternal Grandmother   . Diabetes Maternal Grandfather   . Hypertension Father   . Alcohol abuse Father   . Urinary tract infection Mother   . Stroke Mother 60    3 strokes    Social History:  History  Alcohol Use No    Comment: hx drinking 1 pt of vodka and 2 24 oz beers     History  Drug Use No    Comment: cocaine-none >1 year.  meth-none >10 years.    Social History   Social History  . Marital Status: Single    Spouse Name: N/A  . Number of Children: N/A  . Years of Education: N/A   Social History Main Topics  . Smoking status: Current Some Day Smoker -- 0.50 packs/day    Types: Cigarettes  . Smokeless tobacco: Never Used  . Alcohol Use: No     Comment: hx drinking 1 pt of vodka and 2 24 oz beers  . Drug Use: No     Comment: cocaine-none >1 year.  meth-none >10 years.  . Sexual Activity: Yes    Birth Control/ Protection: None   Other Topics Concern  . None   Social History Narrative  Risk to Self: Is patient at risk for suicide?: Yes Risk to Others:   Prior Inpatient Therapy:   Prior Outpatient Therapy:    Level of Care:  OP  Hospital Course:  SHERLE MELLO was admitted for Substance induced mood disorder ([place]) and crisis management.  She was treated discharged with the medications listed below under Medication List.  Medical problems were identified and treated as needed.  Home medications were restarted as appropriate.  Improvement was monitored by observation and [name] A Gertner daily report of symptom reduction.  Emotional and mental status was monitored by daily self-inventory reports completed by Soledad Gerlach and clinical staff.         Gaelle A Alas was evaluated by the treatment team for stability and plans for continued recovery upon discharge.  Ani A Gilroy motivation was an integral factor for scheduling further treatment.  Employment, transportation, bed availability, health status,  family support, and any pending legal issues were also considered during her hospital stay.  She was offered further treatment options upon discharge including but not limited to Residential, Intensive Outpatient, and Outpatient treatment.  Lameka A Siers will follow up with the services as listed below under Follow Up Information.     Upon completion of this admission the patient was both mentally and medically stable for discharge denying suicidal/homicidal ideation, auditory/visual/tactile hallucinations, delusional thoughts and paranoia.      Consults:  psychiatry  Significant Diagnostic Studies:  labs: per ED  Discharge Vitals:   Blood pressure 121/87, pulse 84, temperature 97.8 F (36.6 C), temperature source Oral, resp. rate 16, height 5' 6"  (1.676 m), weight 116.574 kg (257 lb), last menstrual period 07/18/2015. Body mass index is 41.5 kg/(m^2). Lab Results:   Results for orders placed or performed during the hospital encounter of 08/15/15 (from the past 72 hour(s))  I-Stat beta hCG blood, ED (  [facility], [facility], [facility] only)     Status: None   Collection Time: 08/15/15 10:27 PM  Result Value Ref Range   I-stat hCG, quantitative <5.0 <5 mIU/mL   Comment 3            Comment:   GEST. AGE      CONC.  (mIU/mL)   <=1 WEEK        5 - 50     2 WEEKS       50 - 500     3 WEEKS       100 - 10,000     4 WEEKS     1,000 - 30,000        FEMALE AND NON-PREGNANT FEMALE:     LESS THAN 5 mIU/mL   Comprehensive metabolic panel     Status: Abnormal   Collection Time: 08/15/15 10:29 PM  Result Value Ref Range   Sodium 132 (L) 135 - 145 mmol/L   Potassium 3.8 3.5 - 5.1 mmol/L   Chloride 100 (L) 101 - 111 mmol/L   CO2 23 22 - 32 mmol/L   Glucose, Bld 91 65 - 99 mg/dL   BUN <5 (L) 6 - 20 mg/dL   Creatinine, Ser 0.91 0.44 - 1.00 mg/dL   Calcium 8.8 (L) 8.9 - 10.3 mg/dL   Total Protein 7.1 6.5 - 8.1 g/dL   Albumin 3.8 3.5 - 5.0 g/dL   AST 16 15 - 41 U/L   ALT 13 (L) 14 - 54 U/L   Alkaline Phosphatase 69 38 -  126 U/L   Total Bilirubin 0.3 0.3 - 1.2 mg/dL   GFR calc non Af Amer >60 >60 mL/min   GFR calc Af Amer >60 >60 mL/min    Comment: (NOTE) The eGFR has been calculated using the CKD EPI equation. This calculation has not been validated in all clinical situations. eGFR's persistently <60 mL/min signify possible Chronic Kidney Disease.    Anion gap 9 5 - 15  CBC     Status: None   Collection Time: 08/15/15 10:29 PM  Result Value Ref Range   WBC 8.5 4.0 - 10.5 K/uL   RBC 4.84 3.87 - 5.11 MIL/uL   Hemoglobin 14.1 12.0 - 15.0 g/dL   HCT 42.0 36.0 - 46.0 %   MCV 86.8 78.0 - 100.0 fL   MCH 29.1 26.0 - 34.0 pg   MCHC 33.6 30.0 - 36.0 g/dL   RDW 13.9 11.5 - 15.5 %   Platelets 349 150 - 400 K/uL  Ethanol (ETOH)     Status: Abnormal   Collection Time: 08/15/15 10:30 PM  Result Value Ref Range   Alcohol, Ethyl (B) 204 (H) <5 mg/dL    Comment:        LOWEST DETECTABLE LIMIT FOR SERUM ALCOHOL IS 5 mg/dL FOR MEDICAL PURPOSES ONLY   Salicylate level     Status: None   Collection Time: 08/15/15 10:30 PM  Result Value Ref Range   Salicylate Lvl <9.5 2.8 - 30.0 mg/dL  Acetaminophen level     Status: Abnormal   Collection Time: 08/15/15 10:30 PM  Result Value Ref Range   Acetaminophen (Tylenol), Serum <10 (L) 10 - 30 ug/mL    Comment:        THERAPEUTIC CONCENTRATIONS VARY SIGNIFICANTLY. A RANGE OF 10-30 ug/mL MAY BE AN EFFECTIVE CONCENTRATION FOR MANY PATIENTS. HOWEVER, SOME ARE BEST TREATED AT CONCENTRATIONS OUTSIDE THIS RANGE. ACETAMINOPHEN CONCENTRATIONS >150 ug/mL AT 4 HOURS AFTER INGESTION AND >50  ug/mL AT 12 HOURS AFTER INGESTION ARE OFTEN ASSOCIATED WITH TOXIC REACTIONS.   Urine rapid drug screen (hosp performed) (Not at [facility])     Status: None   Collection Time: 08/15/15 10:31 PM  Result Value Ref Range   Opiates NONE DETECTED NONE DETECTED   Cocaine NONE DETECTED NONE DETECTED   Benzodiazepines NONE DETECTED NONE DETECTED   Amphetamines NONE DETECTED NONE DETECTED    Tetrahydrocannabinol NONE DETECTED NONE DETECTED   Barbiturates NONE DETECTED NONE DETECTED    Comment:        DRUG SCREEN FOR MEDICAL PURPOSES ONLY.  IF CONFIRMATION IS NEEDED FOR ANY PURPOSE, NOTIFY LAB WITHIN 5 DAYS.        LOWEST DETECTABLE LIMITS FOR URINE DRUG SCREEN Drug Class       Cutoff (ng/mL) Amphetamine      1000 Barbiturate      200 Benzodiazepine   491 Tricyclics       791 Opiates          300 Cocaine          300 THC              50     Physical Findings: AIMS: Facial and Oral Movements Muscles of Facial Expression: None, normal Lips and Perioral Area: None, normal Jaw: None, normal Tongue: None, normal,Extremity Movements Upper (arms, wrists, hands, fingers): None, normal Lower (legs, knees, ankles, toes): None, normal, Trunk Movements Neck, shoulders, hips: None, normal, Overall Severity Severity of abnormal movements (highest score from questions above): None, normal Incapacitation due to abnormal movements: None, normal Patient's awareness of abnormal movements (rate only patient's report): No Awareness, Dental Status Current problems with teeth and/or dentures?: No Does patient usually wear dentures?: No  CIWA:    COWS:      See Psychiatric Specialty Exam and Suicide Risk Assessment completed by Attending Physician prior to discharge.  Discharge destination:  Home  Is patient on multiple antipsychotic therapies at discharge:  No   Has Patient had three or more failed trials of antipsychotic monotherapy by history:  No    Recommended Plan for Multiple Antipsychotic Therapies: NA     Medication List    STOP taking these medications        acetaminophen 500 MG tablet  Commonly known as:  TYLENOL     HAIR/SKIN/NAILS PO     LORazepam 1 MG tablet  Commonly known as:  ATIVAN     Suvorexant 15 MG Tabs  Commonly known as:  BELSOMRA     tiZANidine 4 MG tablet  Commonly known as:  ZANAFLEX     TURMERIC PO      TAKE these medications       Indication   FLUoxetine 40 MG capsule  Commonly known as:  PROZAC  Take 1 capsule (40 mg total) by mouth at bedtime.   Indication:  Major Depressive Disorder     gabapentin 400 MG capsule  Commonly known as:  NEURONTIN  Take 1 capsule (400 mg total) by mouth 4 (four) times daily. For agitation   Indication:  Agitation     lamoTRIgine 200 MG tablet  Commonly known as:  LAMICTAL  Take 1 tablet (200 mg total) by mouth daily.   Indication:  Mood stabilization     methocarbamol 500 MG tablet  Commonly known as:  ROBAXIN  Take 1 tablet (500 mg total) by mouth 2 (two) times daily as needed for muscle spasms.      multivitamin with minerals  Tabs tablet  Take 1 tablet by mouth daily.      omeprazole 20 MG capsule  Commonly known as:  PRILOSEC  Take 1 capsule (20 mg total) by mouth daily. For acid reflux   Indication:  Gastroesophageal Reflux Disease     prazosin 2 MG capsule  Commonly known as:  MINIPRESS  Take 1 capsule (2 mg total) by mouth at bedtime.   Indication:  Nightmares     SUMAtriptan 50 MG tablet  Commonly known as:  IMITREX  Take 1 tablet (50 mg total) by mouth every 2 (two) hours as needed for migraine or headache.   Indication:  Headache, Migraine Headache     traZODone 50 MG tablet  Commonly known as:  DESYREL  Take 1 tablet (50 mg total) by mouth at bedtime and may repeat dose one time if needed.   Indication:  Trouble Sleeping       Follow-up Information    Follow up with [facility] On 08/24/2015.   Why:  Appt on this date at 10:30AM for medication management.    Contact information:   ATTN: Dr. Adele Schilder [address] [place], [place] [address] Phone: [phone] Fax: [phone]      Follow up with [facility] On 08/18/2015.   Why:  Appt on this date with Leann at 12:30PM for counseling.    Contact information:   [address] [place], [place] [address] Phone: [phone] Fax: [phone]       Follow-up recommendations:  Activity:  as tol Diet:  as tol  Comments:  1.  Take all your medications as prescribed.              2.  Report any adverse side effects to outpatient provider.                       3.  Patient instructed to not use alcohol or illegal drugs while on prescription medicines.            4.  In the event of worsening symptoms, instructed patient to call 911, the crisis hotline or go to nearest emergency room for evaluation of symptoms.  Total Discharge Time:  30 min  Signed: Freda Munro May Agustin AGNP-BC 08/16/2015, 12:46 PM  I personally assessed the patient and formulated the plan Geralyn Flash A. Sabra Heck, M.D.

## 2015-08-16 NOTE — Progress Notes (Signed)
  [facility] Adult Case Management Discharge Plan :  Will you be returning to the same living situation after discharge:  Yes,  home with mother At discharge, do you have transportation home?: Yes,  mother Do you have the ability to pay for your medications: Yes,  [employer] private insurance  Release of information consent forms completed and submitted to medical records by CSW.  Patient to Follow up at: Follow-up Information    Follow up with [facility] On 08/24/2015.   Why:  Appt on this date at 10:30AM for medication management.    Contact information:   ATTN: Dr. Lolly Mustache [address] [place], [place] [address] Phone: [phone] Fax: [phone]      Follow up with [facility] On 08/18/2015.   Why:  Appt on this date with Leann at 12:30PM for counseling.    Contact information:   [name] [place], [place] [address] Phone: [phone] Fax: [phone]      Patient denies SI/HI: Yes,  during self report.     Safety Planning and Suicide Prevention discussed: Yes,  SPE completed with pt. pt refused to consent to SPE with family. Pt given SPI pamphlet and mobile crisis information.  Have you used any form of tobacco in the last 30 days? (Cigarettes, Smokeless Tobacco, Cigars, and/or Pipes): Yes  Has patient been referred to the Quitline?: Patient refused referral  Smart, Lebron Quam 08/16/2015, 10:43 AM

## 2015-08-16 NOTE — ED Notes (Signed)
Beth [name] called and patient has been accepted to beh room 302-2

## 2015-08-16 NOTE — Tx Team (Signed)
Initial Interdisciplinary Treatment Plan   PATIENT STRESSORS: Financial difficulties Marital or family conflict Substance abuse   PATIENT STRENGTHS: Ability for insight Active sense of humor Average or above average intelligence Capable of independent living Communication skills General fund of knowledge Motivation for treatment/growth   PROBLEM LIST: Problem List/Patient Goals Date to be addressed Date deferred Reason deferred Estimated date of resolution  Depression 08/16/15     Suicidal thoughts 08/16/15     Substance Abuse 08/16/15     "No trust with my parents, they think I'm gonna go back to doing what I used to do" 08/16/15                                    DISCHARGE CRITERIA:  Ability to meet basic life and health needs Improved stabilization in mood, thinking, and/or behavior Verbal commitment to aftercare and medication compliance Withdrawal symptoms are absent or subacute and managed without 24-hour nursing intervention  PRELIMINARY DISCHARGE PLAN: Attend aftercare/continuing care group Return to previous living arrangement  PATIENT/FAMIILY INVOLVEMENT: This treatment plan has been presented to and reviewed with the patient, Beth [name], and/or family member, .  The patient and family have been given the opportunity to ask questions and make suggestions.  [name], [place] 08/16/2015, 6:11 AM

## 2015-08-16 NOTE — Tx Team (Signed)
Interdisciplinary Treatment Plan Update (Adult)  Date:  08/16/2015  Time Reviewed:  8:28 AM   Progress in Treatment: Attending groups: Yes. Participating in groups:  Yes. Taking medication as prescribed:  Yes. Tolerating medication:  Yes. Family/Significant othe contact made:  SPE completed with pt, as she refused to consent to family contact.  Patient understands diagnosis:  Yes. and As evidenced by:  seeking treatment for SI, depression, alcohol abuse and overdose attempt.  Discussing patient identified problems/goals with staff:  Yes. Medical problems stabilized or resolved:  Yes. Denies suicidal/homicidal ideation: Yes. Issues/concerns per patient self-inventory:  Other:  Discharge Plan or Barriers: Pt d/cing back home with her mother today. Pt has appts scheduled for med management with Dr. Adele Schilder and counseling with Marylin Crosby at [facility].  Reason for Continuation of Hospitalization: none  Comments:  Beth [name] is a 34 y.o. female who voluntarily [facility] to [facility] with SI/SA and depression. Pt told the medical staff that she is an alcoholic and she became agitated because her mother discussed her about drinking too much. She then became upset and overdosed on 12-60m ativan pills and drank 1.5-24oz beers. Pt states that she's been SI x150monthnd her thoughts are triggered by financial/employment issues and problems with her step father. Pt says she drinks 2-16oz weekly. Pt is drowsy during interview but manages to engage with this wrProbation officerDiagnosis upon admission: Major depressive disorder, Recurrent episode, Severe; 303.9 Alcohol use disorder, Moderate  Estimated length of stay:  D/c today  Additional Comments:  Patient and CSW reviewed pt's identified goals and treatment plan. Patient verbalized understanding and agreed to treatment plan. [facility] reviewed [facility]" [facility]. Pt verbalized understanding of information provided and signed  form.    Review of initial/current patient goals per problem list:  1. Goal(s): Patient will participate in aftercare plan  Met: Yes  Target date: at discharge  As evidenced by: Patient will participate within aftercare plan AEB aftercare provider and housing plan at discharge being identified.  10/18: Pt to return home and [facility]. Appts made.   2. Goal (s): Patient will exhibit decreased depressive symptoms and suicidal ideations.  Met:  Yes.    Target date: at discharge  As evidenced by: Patient will utilize self rating of depression at 3 or below and demonstrate decreased signs of depression or be deemed stable for discharge by MD.  10/18: Pt rates depression as low. Pt appears to be presenting at her baseline. Denies SI/HI/AVH.   3. Goal(s): Patient will demonstrate decreased signs of withdrawal due to substance abuse  MePYK:DXIPdequate for discharge per Dr. LuSabra Heck  Target date:at discharge   As evidenced by: Patient will produce a CIWA/COWS score of 0, have stable vitals signs, and no symptoms of withdrawal.  10/18: Pt reports mild withdrawal symptoms with CIWA score of 6 and stable vitals. Per MD, pt is medically stable for d/c.   Attendees: Patient:   08/16/2015 8:28 AM   Family:   08/16/2015 8:28 AM   Physician:  Dr. IrCarlton AdamMD 08/16/2015 8:28 AM   Nursing:   ShDarlin DropN 08/16/2015 8:28 AM   Clinical Social Worker: HeMaxie BetterLCRichboro10/18/2016 8:28 AM   Clinical Social Worker: KrErasmo Downerrinkard LCSWA; LaPeri MarisCSWA 08/16/2015 8:28 AM   Other:  JeGerline Legacyurse Case Manager 08/16/2015 8:28 AM   Other:  VaLucinda DellMonarch TCT  08/16/2015 8:28 AM  Other:   08/16/2015 8:28 AM   Other:  08/16/2015 8:28 AM   Other:  08/16/2015 8:28 AM   Other:  08/16/2015 8:28 AM    08/16/2015 8:28 AM    08/16/2015 8:28 AM    08/16/2015 8:28 AM    08/16/2015 8:28 AM    Scribe for  Treatment Team:   Maxie Better, Gideon  08/16/2015 8:28 AM

## 2015-08-18 ENCOUNTER — Encounter (HOSPITAL_COMMUNITY): Payer: Self-pay | Admitting: Psychology

## 2015-08-18 ENCOUNTER — Ambulatory Visit (HOSPITAL_COMMUNITY): Payer: Self-pay | Admitting: Psychology

## 2015-08-18 NOTE — Progress Notes (Signed)
Beth [name] is a 34 y.o. female patient who didn't show for appointment.  Letter sent.        Forde Radon, LPC

## 2015-08-24 ENCOUNTER — Emergency Department (HOSPITAL_COMMUNITY): Payer: BLUE CROSS/BLUE SHIELD

## 2015-08-24 ENCOUNTER — Encounter (HOSPITAL_COMMUNITY): Payer: Self-pay | Admitting: *Deleted

## 2015-08-24 ENCOUNTER — Emergency Department (HOSPITAL_COMMUNITY)
Admission: EM | Admit: 2015-08-24 | Discharge: 2015-08-24 | Disposition: A | Discharge status: Home / Self Care | Payer: BLUE CROSS/BLUE SHIELD | Attending: Physician Assistant | Admitting: Physician Assistant

## 2015-08-24 ENCOUNTER — Ambulatory Visit (HOSPITAL_COMMUNITY): Payer: Self-pay | Admitting: Psychiatry

## 2015-08-24 DIAGNOSIS — W19XXXA Unspecified fall, initial encounter: Secondary | ICD-10-CM

## 2015-08-24 DIAGNOSIS — F419 Anxiety disorder, unspecified: Secondary | ICD-10-CM | POA: Insufficient documentation

## 2015-08-24 DIAGNOSIS — Y9389 Activity, other specified: Secondary | ICD-10-CM | POA: Diagnosis not present

## 2015-08-24 DIAGNOSIS — Z79899 Other long term (current) drug therapy: Secondary | ICD-10-CM | POA: Diagnosis not present

## 2015-08-24 DIAGNOSIS — G43909 Migraine, unspecified, not intractable, without status migrainosus: Secondary | ICD-10-CM | POA: Insufficient documentation

## 2015-08-24 DIAGNOSIS — F17210 Nicotine dependence, cigarettes, uncomplicated: Secondary | ICD-10-CM | POA: Diagnosis not present

## 2015-08-24 DIAGNOSIS — S8012XA Contusion of left lower leg, initial encounter: Secondary | ICD-10-CM | POA: Insufficient documentation

## 2015-08-24 DIAGNOSIS — S3992XA Unspecified injury of lower back, initial encounter: Secondary | ICD-10-CM | POA: Insufficient documentation

## 2015-08-24 DIAGNOSIS — W1839XA Other fall on same level, initial encounter: Secondary | ICD-10-CM | POA: Diagnosis not present

## 2015-08-24 DIAGNOSIS — Y9289 Other specified places as the place of occurrence of the external cause: Secondary | ICD-10-CM | POA: Diagnosis not present

## 2015-08-24 DIAGNOSIS — Y998 Other external cause status: Secondary | ICD-10-CM | POA: Insufficient documentation

## 2015-08-24 DIAGNOSIS — K219 Gastro-esophageal reflux disease without esophagitis: Secondary | ICD-10-CM | POA: Diagnosis not present

## 2015-08-24 DIAGNOSIS — G8929 Other chronic pain: Secondary | ICD-10-CM | POA: Diagnosis not present

## 2015-08-24 DIAGNOSIS — J45909 Unspecified asthma, uncomplicated: Secondary | ICD-10-CM | POA: Diagnosis not present

## 2015-08-24 DIAGNOSIS — Z9884 Bariatric surgery status: Secondary | ICD-10-CM | POA: Diagnosis not present

## 2015-08-24 DIAGNOSIS — S8992XA Unspecified injury of left lower leg, initial encounter: Secondary | ICD-10-CM | POA: Diagnosis present

## 2015-08-24 MED ORDER — ACETAMINOPHEN 325 MG PO TABS: 650.0000 mg | ORAL_TABLET | Freq: Once | ORAL | Status: DC

## 2015-08-24 MED ORDER — OXYCODONE-ACETAMINOPHEN 5-325 MG PO TABS
ORAL_TABLET | ORAL | Status: AC
  Filled 2015-08-24: qty 1

## 2015-08-24 MED ORDER — OXYCODONE-ACETAMINOPHEN 5-325 MG PO TABS
1.0000 | ORAL_TABLET | Freq: Once | ORAL | Status: AC
  Administered 2015-08-24: 1 via ORAL

## 2015-08-24 NOTE — ED Notes (Signed)
MD at [facility]

## 2015-08-24 NOTE — ED Provider Notes (Signed)
CSN: [id number]     Arrival date & time 08/24/15  1505 History   First MD Initiated Contact with Patient 08/24/15 1806     Chief Complaint  Patient presents with  . Fall  . Leg Pain     (Consider location/radiation/quality/duration/timing/severity/associated sxs/prior Treatment) HPI   Patient is a 34 year old female with history of chronic pain, fibromyalgia, chronic nausea, migraines, anxiety, depression presenting today after fall and leg pain. Patient reports that she fell 3 days ago. She was getting out of a car and has a small bruise on her left anterior lower extremity distal to her left knee.  She fell again today out of the bathtub. She reports she feels like she reinjured the area. She reports a lot of pain when ambulating. She reports a lot of pain and sensitivity to touch.  Past Medical History  Diagnosis Date  . Acid reflux   . Asthma   . History of blood transfusion   . Multiple sclerosis (HCC) 2005    diagnosed in [place], failed steroids, no other medication  . Anxiety   . Gastric ulcer   . Migraine   . Chronic nausea     self reported  . Chronic diarrhea     self reported  . Chronic pain     self reported chronic pain, back pain, fibromyalgia  . Insomnia   . Polysubstance abuse   . ETOH abuse   . Delirium tremens (HCC)   . Depression    Past Surgical History  Procedure Laterality Date  . Gastric bypass    . Diagnostic laparoscopy  2003    repair of perforation at [facility] anastamosis   . Lumbar laminectomy/decompression microdiscectomy Right 04/14/2014    Procedure: LUMBAR LAMINECTOMY/DECOMPRESSION MICRODISCECTOMY LEVEL L4-5;  Surgeon: Maeola Harman, MD;  Location: [facility];  Service: Neurosurgery;  Laterality: Right;  LUMBAR LAMINECTOMY/DECOMPRESSION MICRODISCECTOMY LEVEL L4-5  . Laparotomy N/A 08/18/2014    Procedure: EXPLORATORY LAPAROTOMY, Patch graft of gastric perforation;  Surgeon: Abigail Miyamoto, MD;  Location: [facility];  Service: General;  Laterality:  N/A;   Family History  Problem Relation Age of Onset  . Hypertension Paternal Grandfather   . Colon cancer Paternal Grandfather 15  . Asthma Paternal Grandmother   . Hypertension Paternal Grandmother   . Breast cancer Paternal Grandmother   . Cancer Paternal Grandmother     lung   . Diabetes Maternal Grandmother   . Diabetes Maternal Grandfather   . Hypertension Father   . Alcohol abuse Father   . Urinary tract infection Mother   . Stroke Mother 79    3 strokes    Social History  Substance Use Topics  . Smoking status: Current Some Day Smoker -- 0.50 packs/day    Types: Cigarettes  . Smokeless tobacco: Never Used  . Alcohol Use: No     Comment: hx drinking 1 pt of vodka and 2 24 oz beers   OB History    Gravida Para Term Preterm AB TAB SAB Ectopic Multiple Living   0         0     Review of Systems  Constitutional: Positive for activity change.  Cardiovascular: Negative for chest pain.  Gastrointestinal: Negative for abdominal pain.  Musculoskeletal: Positive for back pain. Negative for joint swelling.  Allergic/Immunologic: Negative for immunocompromised state.  Neurological: Negative for seizures.  Psychiatric/Behavioral: Negative for behavioral problems.      Allergies  Cymbalta; Dilaudid; Abilify; Naltrexone; and Prednisone  Home Medications   Prior  to Admission medications   Medication Sig Start Date End Date Taking? Authorizing Provider  FLUoxetine (PROZAC) 40 MG capsule Take 1 capsule (40 mg total) by mouth at bedtime. 08/16/15   Adonis Brook, NP  gabapentin (NEURONTIN) 400 MG capsule Take 1 capsule (400 mg total) by mouth 4 (four) times daily. For agitation 08/16/15   Rachael Fee, MD  lamoTRIgine (LAMICTAL) 200 MG tablet Take 1 tablet (200 mg total) by mouth daily. 08/16/15   Adonis Brook, NP  methocarbamol (ROBAXIN) 500 MG tablet Take 1 tablet (500 mg total) by mouth 2 (two) times daily as needed for muscle spasms. 08/16/15   Adonis Brook, NP   Multiple Vitamin (MULTIVITAMIN WITH MINERALS) TABS tablet Take 1 tablet by mouth daily. 08/16/15   Adonis Brook, NP  omeprazole (PRILOSEC) 20 MG capsule Take 1 capsule (20 mg total) by mouth daily. For acid reflux 05/30/15   Sanjuana Kava, NP  prazosin (MINIPRESS) 2 MG capsule Take 1 capsule (2 mg total) by mouth at bedtime. 08/16/15   Rachael Fee, MD  SUMAtriptan (IMITREX) 50 MG tablet Take 1 tablet (50 mg total) by mouth every 2 (two) hours as needed for migraine or headache. 05/30/15   Sanjuana Kava, NP  traZODone (DESYREL) 50 MG tablet Take 1 tablet (50 mg total) by mouth at bedtime and may repeat dose one time if needed. 08/16/15   Rachael Fee, MD   BP 120/59 mmHg  Pulse 77  Temp(Src) 98.6 F (37 C) (Oral)  Resp 18  SpO2 97% Physical Exam  Constitutional: She appears well-developed and well-nourished.  Obese 34 year old white female  HENT:  Head: Normocephalic and atraumatic.  Eyes: Conjunctivae are normal. Right eye exhibits no discharge.  Neck: Neck supple.  Cardiovascular: Normal rate.   No murmur heard. Pulmonary/Chest: Effort normal.  Musculoskeletal: She exhibits no edema.  Patient has a small 3 cm bruise on left leg. It is located lateral and distal to the left knee. Patient has soft compartments. Patient able to flex and extend the leg. And foot. Sensation intact. Pulses intact. Patient able to straight leg lift. No back pain.  Neurological: No cranial nerve deficit.  Skin: Skin is warm and dry. No rash noted. She is not diaphoretic.  Nursing note and vitals reviewed.   ED Course  Procedures (including critical care time) Labs Review Labs Reviewed - No data to display  Imaging Review No results found. I have personally reviewed and evaluated these images and lab results as part of my medical decision-making.   EKG Interpretation None      MDM   Final diagnoses:  Fall    She is a 34 year old female with history significant for anxiety depression  fibromyalgia chronic pain a presenting today with left leg pain after fall. Patient has small bruise less than 3 cm. She's got no other signs of trauma. Patient has good range of motion. Patient has sensitivity to touch anywhere on her left leg. However she has soft compartments that are nontender when she is distracted.   Given her soft compartments, and lack of extensive trauma I'm not concerned about a compartment syndrome at this time. We will do x-rays to make sure she doesn't have fracture. Mom is insistent that we get a back x-ray as well because she recently had a back surgery. She's had no trauma to her back today.   I have told patient and her family that she will not be receiving any narcotics from me unless there  is a fracture of a bone. Otherwise she can use Tylenol and hot compresses to help at home.  8:19 PM Patient has no fractures on xray. Mom concerned and would like fruther work up for stroke. Patient has no significant neurologic findings on exam. Mom concerned because pt is falling a lot, confused, occasional slurred speech.  In review of her last visit, asked whether patietn had been taking medication as perscribed. Pt became irrate saying "my chart is none of your fucking business".    I feel patietn is safe to discharge at this point. Patient unhappy with the pain relief I am offering.  However in light of recent overdose and no acute findings such as fracture, I do not feel comfortable perscribing narcotics at this time.   Shianne Zeiser Randall An, MD 08/24/15 2022

## 2015-08-24 NOTE — ED Notes (Signed)
Pt requesting pain medication. Dr. Informed.

## 2015-08-24 NOTE — Discharge Instructions (Signed)
Please return with any additional concerns.  Use tylenol and hot compresses to help with pain.  Please follow up with PCP this week.

## 2015-08-24 NOTE — ED Notes (Addendum)
Dr. Juliann Pares went into room to discuss clinical exam findings and discharge instructions. Pt was informed there were no broken bones. Pt then verbalized she was unhappy she did not receive any pain medication but MD reminded patient that she had just received perocet in triage shortly before she arrived to her room in [facility]. MD stated she would be happy to provide tylenol but patient stated it would not help. Pts mother also verbalized concern about a possible stroke due to patient's frequent falling and slurred speech. Upon arrival and throughout duration of today's visit there were no neuro deficits noted, no slurred speech, no unsteady gait, no numbness or tingling either. Pt was able to ambulate from chair to bed with no deficits upon arrival to room in [facility]. Furthermore, MD reassured patient and mother that pt did not appear to have had a stroke according to the MD's examination today and would feel comfortable discharging her home. Pts mother stated she was okay with this discission and this RN instructed patient to continue to monitor her symptoms. I reviewed signs and symptoms of stroke with patient and her mother and told them to call 9-1-1 if any stroke like symptoms occur and pt and family stated they understood. MD asked patient about her previous visit here to the ED and patient began to cry and curse at MD stating that it was none of the doctors business why she was here last time. MD continued to explain the reason for her questioning it and its relationship to today's visit but pt kept interrupting the doctor and inappropriately cursed at her multiple times and demanded to be seen by another physician but Dr. Juliann Pares informed the patient that she was being discharged home. Pt stated she would just check back in to see another doctor. This RN informed patient she had every right to do so. This RN apologized multiple times and even offered the tylenol again and patient refused and stated she felt as  though no one here cared about her. I informed her this was not true and I stated I was sorry she felt like that and I asked her if there was anything I could do to help and patient stated no. Pt refused to sign any discharge paperwork and pt also stated she did not want the discharge instructions paperwork but this RN reviewed it with the patient and her family anyway but patient left the paperwork in the room at discharge. Pt was then discharged out of ED via wheelchair, no acute distress noted.

## 2015-08-24 NOTE — ED Notes (Signed)
Pt has MS, reports increase in tremors recently. Had fall yesterday which caused left knee pain, then fell again today and having left leg pain from lower back, hip and down leg.

## 2015-08-26 ENCOUNTER — Other Ambulatory Visit (HOSPITAL_COMMUNITY): Payer: Self-pay | Admitting: Psychiatry

## 2015-08-29 ENCOUNTER — Encounter (HOSPITAL_COMMUNITY): Payer: Self-pay | Admitting: Psychiatry

## 2015-08-29 ENCOUNTER — Ambulatory Visit (INDEPENDENT_AMBULATORY_CARE_PROVIDER_SITE_OTHER): Payer: BLUE CROSS/BLUE SHIELD | Admitting: Psychiatry

## 2015-08-29 VITALS — BP 112/74 | HR 84 | Ht 66.0 in | Wt 265.6 lb

## 2015-08-29 DIAGNOSIS — F4310 Post-traumatic stress disorder, unspecified: Secondary | ICD-10-CM | POA: Diagnosis not present

## 2015-08-29 DIAGNOSIS — F332 Major depressive disorder, recurrent severe without psychotic features: Secondary | ICD-10-CM | POA: Diagnosis not present

## 2015-08-29 MED ORDER — RISPERIDONE 1 MG PO TABS: 1.0000 mg | ORAL_TABLET | Freq: Every day | ORAL | Status: DC

## 2015-08-29 NOTE — Progress Notes (Signed)
Beth [name] [phone] Progress Note  Beth [name] [id number] 34 y.o.  08/29/2015 3:51 PM  Chief Complaint:    I was briefly admitted to behavioral [place].  I relapsed into drinking.  I was not sleeping well.  I still have nightmares and flashback.      History of Present Illness:  Beth [name] came for her follow-up appointment.  She missed her last appointment .  She was briefly admitted to behavioral [place] after relapsed into drinking.  Her blood alcohol level was 204.  She reported poor sleep, worsening of depression and having severe nightmares and flashback.  She is taking Neurontin, Minipress, Prozac, trazodone and Lamictal.  Despite taking all these medication she continues to have nightmares and flashback.  She brought a list of symptoms which is written on the paper.  She mentioned forgetfulness, crying spells, feeling dissociated symptoms, isolated and withdrawn.  She admitted sometime feeling hopeless but denies any active or passive suicidal thoughts or homicidal thought.  She wants to go back on Ativan however due to recent relapse into alcohol we will defer any benzodiazepine.  She is living with her parents however she has a good support from her boyfriend.  Patient denies any hallucination or any paranoia but endorsed lack of energy, withdrawn and sadness.  She is also complaining of chronic back pain.  She is taking gabapentin and muscle relaxants.  She is scheduled to see Dr. Lottie [name] in few days and she liked to discuss her tonic pain.  Patient has no contact with her ex who is in jail.  Patient denies any illegal substance use.  She mentioned since she left the hospital she is not drinking.  Her appetite is okay.  Her vitals are stable.  She is seeing Beth [name] for counseling.  Patient also need a letter addressing to social security so she can get food stamps as she is unable to work at this time.  Suicidal Ideation: No Plan Formed: No Patient has means  to carry out plan: No  Homicidal Ideation: No Plan Formed: No Patient has means to carry out plan: No  Past Psychiatric History/Hospitalization(s): Patient has at least 4 psychiatric hospitalization in this calendar year.   She was briefly admitted 2 weeks ago at behavioral [facility] due to relapsed into drinking. In the past she admitted at behavioral [facility] and old [facility].  [facility].  She is taking antianxiety medication for past few years.  She had tried Klonopin, Ativan, Prozac, Zoloft from her primary care physician.  She was given nortriptyline, BuSpar, trazodone, gabapentin, Zyprexa, naltrexone, Lamictal, Minipress, Neurontin, Atarax, Belsomra, Seroquel  and Abilify.  Abilify was stopped due to side effects.  She has extensive history of PTSD, depression and bipolar disorder.  She has history of suicidal thoughts.  She has extensive history of drinking alcohol, using cocaine and IV heroin. Anxiety: Yes Bipolar Disorder: Yes Depression: Yes Mania: Yes Psychosis: Yes Schizophrenia: No Personality Disorder: No Hospitalization for psychiatric illness: Yes History of Electroconvulsive Shock Therapy: No Prior Suicide Attempts: No  Medical History; Patient has multiple sclerosis, history of gastric bypass surgery, anemia, chronic back pain, history of back surgery.  Her primary care physician is Dr. Lottie [name].   Legal History; Patient has DWI charges.   Review of Systems  Constitutional: Positive for malaise/fatigue.  Cardiovascular: Negative for chest pain.  Musculoskeletal: Positive for back pain.  Skin: Negative for itching and rash.  Neurological: Positive for tingling and headaches. Negative for dizziness and tremors.       Neuropathy  Psychiatric/Behavioral: Positive for depression. The patient is nervous/anxious and has insomnia.     Psychiatric: Agitation: No Hallucination: No Depressed Mood:  Yes Insomnia: Yes Hypersomnia: No Altered Concentration: No Feels Worthless: No Grandiose Ideas: No Belief In Special Powers: No New/Increased Substance Abuse: No Compulsions: No  Neurologic: Headache: No Seizure: No Paresthesias: Yes   Outpatient Encounter Prescriptions as of 08/29/2015  Medication Sig  . FLUoxetine (PROZAC) 40 MG capsule Take 1 capsule (40 mg total) by mouth at bedtime.  . gabapentin (NEURONTIN) 400 MG capsule Take 1 capsule (400 mg total) by mouth 4 (four) times daily. For agitation  . lamoTRIgine (LAMICTAL) 200 MG tablet Take 1 tablet (200 mg total) by mouth daily.  . methocarbamol (ROBAXIN) 500 MG tablet Take 1 tablet (500 mg total) by mouth 2 (two) times daily as needed for muscle spasms.  . Multiple Vitamin (MULTIVITAMIN WITH MINERALS) TABS tablet Take 1 tablet by mouth daily.  Marland Kitchen omeprazole (PRILOSEC) 20 MG capsule Take 1 capsule (20 mg total) by mouth daily. For acid reflux  . risperiDONE (RISPERDAL) 1 MG tablet Take 1 tablet (1 mg total) by mouth at bedtime.  . SUMAtriptan (IMITREX) 50 MG tablet Take 1 tablet (50 mg total) by mouth every 2 (two) hours as needed for migraine or headache.  . traZODone (DESYREL) 50 MG tablet Take 1 tablet (50 mg total) by mouth at bedtime and may repeat dose one time if needed.  . [DISCONTINUED] prazosin (MINIPRESS) 2 MG capsule Take 1 capsule (2 mg total) by mouth at bedtime.   No facility-administered encounter medications on file as of 08/29/2015.    Recent Results (from the past 2160 hour(s))  Basic metabolic panel     Status: Abnormal   Collection Time: 07/18/15 10:34 PM  Result Value Ref Range   Sodium 138 135 - 145 mmol/L   Potassium 3.1 (L) 3.5 - 5.1 mmol/L   Chloride 106 101 - 111 mmol/L   CO2 24 22 - 32 mmol/L   Glucose, Bld 82 65 - 99 mg/dL   BUN 7 6 - 20 mg/dL   Creatinine, Ser 0.84 0.44 - 1.00 mg/dL   Calcium 8.9 8.9 - 10.3 mg/dL   GFR calc non Af Amer >60 >60 mL/min   GFR calc Af Amer >60 >60 mL/min     Comment: (NOTE) The eGFR has been calculated using the CKD EPI equation. This calculation has not been validated in all clinical situations. eGFR's persistently <60 mL/min signify possible Chronic Kidney Disease.    Anion gap 8 5 - 15  CBC     Status: None   Collection Time: 07/18/15 10:34 PM  Result Value Ref Range   WBC 7.8 4.0 - 10.5 K/uL   RBC 4.43 3.87 - 5.11 MIL/uL   Hemoglobin 12.6 12.0 - 15.0 g/dL   HCT 37.8 36.0 - 46.0 %   MCV 85.3 78.0 - 100.0 fL   MCH 28.4 26.0 - 34.0 pg   MCHC 33.3 30.0 - 36.0 g/dL   RDW 13.9 11.5 - 15.5 %   Platelets 264 150 - 400 K/uL  I-stat troponin, ED     Status: None   Collection Time: 07/18/15 10:39 PM  Result Value Ref Range   Troponin i, poc 0.00 0.00 - 0.08 ng/mL   Comment 3            Comment: Due to the release  kinetics of cTnI, a negative result within the first hours of the onset of symptoms does not rule out myocardial infarction with certainty. If myocardial infarction is still suspected, repeat the test at appropriate intervals.   Urinalysis, Routine w reflex microscopic (not at [facility])     Status: Abnormal   Collection Time: 07/18/15 11:37 PM  Result Value Ref Range   Color, Urine YELLOW YELLOW   APPearance CLEAR CLEAR   Specific Gravity, Urine 1.006 1.005 - 1.030   pH 6.5 5.0 - 8.0   Glucose, UA NEGATIVE NEGATIVE mg/dL   Hgb urine dipstick NEGATIVE NEGATIVE   Bilirubin Urine NEGATIVE NEGATIVE   Ketones, ur NEGATIVE NEGATIVE mg/dL   Protein, ur NEGATIVE NEGATIVE mg/dL   Urobilinogen, UA 0.2 0.0 - 1.0 mg/dL   Nitrite NEGATIVE NEGATIVE   Leukocytes, UA TRACE (A) NEGATIVE  Urine microscopic-add on     Status: None   Collection Time: 07/18/15 11:37 PM  Result Value Ref Range   Squamous Epithelial / LPF RARE RARE   WBC, UA 0-2 <3 WBC/hpf   Bacteria, UA RARE RARE  I-stat troponin, ED     Status: None   Collection Time: 07/19/15  1:38 AM  Result Value Ref Range   Troponin i, poc 0.01 0.00 - 0.08 ng/mL   Comment 3             Comment: Due to the release kinetics of cTnI, a negative result within the first hours of the onset of symptoms does not rule out myocardial infarction with certainty. If myocardial infarction is still suspected, repeat the test at appropriate intervals.   I-Stat beta hCG blood, ED ([facility], [facility], [facility] only)     Status: None   Collection Time: 08/15/15 10:27 PM  Result Value Ref Range   I-stat hCG, quantitative <5.0 <5 mIU/mL   Comment 3            Comment:   GEST. AGE      CONC.  (mIU/mL)   <=1 WEEK        5 - 50     2 WEEKS       50 - 500     3 WEEKS       100 - 10,000     4 WEEKS     1,000 - 30,000        FEMALE AND NON-PREGNANT FEMALE:     LESS THAN 5 mIU/mL   Comprehensive metabolic panel     Status: Abnormal   Collection Time: 08/15/15 10:29 PM  Result Value Ref Range   Sodium 132 (L) 135 - 145 mmol/L   Potassium 3.8 3.5 - 5.1 mmol/L   Chloride 100 (L) 101 - 111 mmol/L   CO2 23 22 - 32 mmol/L   Glucose, Bld 91 65 - 99 mg/dL   BUN <5 (L) 6 - 20 mg/dL   Creatinine, Ser 0.91 0.44 - 1.00 mg/dL   Calcium 8.8 (L) 8.9 - 10.3 mg/dL   Total Protein 7.1 6.5 - 8.1 g/dL   Albumin 3.8 3.5 - 5.0 g/dL   AST 16 15 - 41 U/L   ALT 13 (L) 14 - 54 U/L   Alkaline Phosphatase 69 38 - 126 U/L   Total Bilirubin 0.3 0.3 - 1.2 mg/dL   GFR calc non Af Amer >60 >60 mL/min   GFR calc Af Amer >60 >60 mL/min    Comment: (NOTE) The eGFR has been calculated using the CKD EPI equation. This calculation has  not been validated in all clinical situations. eGFR's persistently <60 mL/min signify possible Chronic Kidney Disease.    Anion gap 9 5 - 15  CBC     Status: None   Collection Time: 08/15/15 10:29 PM  Result Value Ref Range   WBC 8.5 4.0 - 10.5 K/uL   RBC 4.84 3.87 - 5.11 MIL/uL   Hemoglobin 14.1 12.0 - 15.0 g/dL   HCT 42.0 36.0 - 46.0 %   MCV 86.8 78.0 - 100.0 fL   MCH 29.1 26.0 - 34.0 pg   MCHC 33.6 30.0 - 36.0 g/dL   RDW 13.9 11.5 - 15.5 %   Platelets 349 150 - 400 K/uL  Ethanol (ETOH)      Status: Abnormal   Collection Time: 08/15/15 10:30 PM  Result Value Ref Range   Alcohol, Ethyl (B) 204 (H) <5 mg/dL    Comment:        LOWEST DETECTABLE LIMIT FOR SERUM ALCOHOL IS 5 mg/dL FOR MEDICAL PURPOSES ONLY   Salicylate level     Status: None   Collection Time: 08/15/15 10:30 PM  Result Value Ref Range   Salicylate Lvl <1.6 2.8 - 30.0 mg/dL  Acetaminophen level     Status: Abnormal   Collection Time: 08/15/15 10:30 PM  Result Value Ref Range   Acetaminophen (Tylenol), Serum <10 (L) 10 - 30 ug/mL    Comment:        THERAPEUTIC CONCENTRATIONS VARY SIGNIFICANTLY. A RANGE OF 10-30 ug/mL MAY BE AN EFFECTIVE CONCENTRATION FOR MANY PATIENTS. HOWEVER, SOME ARE BEST TREATED AT CONCENTRATIONS OUTSIDE THIS RANGE. ACETAMINOPHEN CONCENTRATIONS >150 ug/mL AT 4 HOURS AFTER INGESTION AND >50 ug/mL AT 12 HOURS AFTER INGESTION ARE OFTEN ASSOCIATED WITH TOXIC REACTIONS.   Urine rapid drug screen (hosp performed) (Not at [facility])     Status: None   Collection Time: 08/15/15 10:31 PM  Result Value Ref Range   Opiates NONE DETECTED NONE DETECTED   Cocaine NONE DETECTED NONE DETECTED   Benzodiazepines NONE DETECTED NONE DETECTED   Amphetamines NONE DETECTED NONE DETECTED   Tetrahydrocannabinol NONE DETECTED NONE DETECTED   Barbiturates NONE DETECTED NONE DETECTED    Comment:        DRUG SCREEN FOR MEDICAL PURPOSES ONLY.  IF CONFIRMATION IS NEEDED FOR ANY PURPOSE, NOTIFY LAB WITHIN 5 DAYS.        LOWEST DETECTABLE LIMITS FOR URINE DRUG SCREEN Drug Class       Cutoff (ng/mL) Amphetamine      1000 Barbiturate      200 Benzodiazepine   109 Tricyclics       604 Opiates          300 Cocaine          300 THC              50       Constitutional:  BP 112/74 mmHg  Pulse 84  Ht _0  (1.676 m)  Wt 265 lb 9.6 oz (120.475 kg)  BMI 42.89 kg/m2  LMP 08/18/2015   Musculoskeletal: Strength & Muscle Tone: within normal limits Gait & Station: normal Patient leans:  N/A  Psychiatric Specialty Exam: General Appearance: Casual and Tired  Eye Contact::  Fair  Speech:  Slow  Volume:  Decreased  Mood:  Depressed  Affect:  Congruent  Thought Process:  Intact  Orientation:  Full (Time, Place, and Person)  Thought Content:  WDL  Suicidal Thoughts:  No  Homicidal Thoughts:  No  Memory:  Immediate;  Fair Recent;   Fair Remote;   Fair  Judgement:  Fair  Insight:  Fair  Psychomotor Activity:  Normal  Concentration:  Fair  Recall:  [employer] of Knowledge:  Fair  Language:  Fair  Akathisia:  No  Handed:  Right  AIMS (if indicated):     Assets:  Communication Skills  ADL's:  Intact  Cognition:  WNL  Sleep:        Established Problem, Stable/Improving (1), Review of Psycho-Social Stressors (1), Review and summation of old records (2), Established Problem, Worsening (2), Review of Last Therapy Session (1), Review of Medication Regimen & Side Effects (2) and Review of New Medication or Change in Dosage (2)  Assessment: Axis I: Severe depression with psychosis, posttraumatic stress disorder, bipolar disorder with psychosis, alcohol dependence, opiate dependence  Axis II: Deferred  Axis III:  Past Medical History  Diagnosis Date  . Acid reflux   . Asthma   . History of blood transfusion   . Multiple sclerosis ([place]) 2005    diagnosed in [place], failed steroids, no other medication  . Anxiety   . Gastric ulcer   . Migraine   . Chronic nausea     self reported  . Chronic diarrhea     self reported  . Chronic pain     self reported chronic pain, back pain, fibromyalgia  . Insomnia   . Polysubstance abuse   . ETOH abuse   . Delirium tremens ([place])   . Depression      Plan:  I reviewed records from her recent*summary, recent blood work including blood alcohol level which was 204.  She still have nightmares flashback.  I offer intensive outpatient program but patient declined and she feels she does not need to be in program and she does  not feel that she need to be hospitalized.  She contract for safety.  I will discontinue Minipress since patient does not see any improvement in her nightmares and flashback.  She has never tried Risperdal.  I recommended to try Risperdal 1 mg at bedtime.  Discussed medication side effects including EPS, tremors, shakes.  Continue Prozac 40 mg daily, Lamictal 200 mg daily.  We will defer any benzodiazepine at this time.  She has given prescription for Neurontin however I strongly encouraged to see her primary care physician for the management of chronic pain and muscle spasm.  She had appointment with Beth [name] in few days.  Discuss safety plan especially if she feels worsening of the symptom including any suicidal thoughts or homicidal thoughts and she need to call 911 or go to the local emergency room.  Discussed substance use in detail and patient acknowledge and trying to stay away from drug and alcohol use.  At this time she does not feel that she need any help as she can do by herself.  Recommended to call us back if she has any question or any concern.  Follow-up in 2 weeks.  She will see [name] for counseling.  She also need a letter addressing to social security so she can get food stamps as she is unable to work at this time.   Careli Luzader T., MD 08/29/2015

## 2015-08-30 ENCOUNTER — Other Ambulatory Visit (HOSPITAL_COMMUNITY): Payer: Self-pay | Admitting: Psychiatry

## 2015-09-12 ENCOUNTER — Encounter (HOSPITAL_COMMUNITY): Payer: Self-pay | Admitting: Psychiatry

## 2015-09-12 ENCOUNTER — Ambulatory Visit (INDEPENDENT_AMBULATORY_CARE_PROVIDER_SITE_OTHER): Payer: BLUE CROSS/BLUE SHIELD | Admitting: Psychiatry

## 2015-09-12 VITALS — BP 133/87 | HR 69 | Ht 66.0 in | Wt 256.2 lb

## 2015-09-12 DIAGNOSIS — F4310 Post-traumatic stress disorder, unspecified: Secondary | ICD-10-CM | POA: Diagnosis not present

## 2015-09-12 DIAGNOSIS — F1120 Opioid dependence, uncomplicated: Secondary | ICD-10-CM | POA: Diagnosis not present

## 2015-09-12 DIAGNOSIS — F332 Major depressive disorder, recurrent severe without psychotic features: Secondary | ICD-10-CM

## 2015-09-12 DIAGNOSIS — F1020 Alcohol dependence, uncomplicated: Secondary | ICD-10-CM

## 2015-09-12 DIAGNOSIS — F333 Major depressive disorder, recurrent, severe with psychotic symptoms: Secondary | ICD-10-CM | POA: Diagnosis not present

## 2015-09-12 MED ORDER — RISPERIDONE 1 MG PO TABS: 1.0000 mg | ORAL_TABLET | Freq: Every day | ORAL | Status: DC

## 2015-09-12 MED ORDER — LAMOTRIGINE 200 MG PO TABS: 200.0000 mg | ORAL_TABLET | Freq: Every day | ORAL | Status: DC

## 2015-09-12 MED ORDER — FLUOXETINE HCL 40 MG PO CAPS: 40.0000 mg | ORAL_CAPSULE | Freq: Every day | ORAL | Status: DC

## 2015-09-12 MED ORDER — BENZTROPINE MESYLATE 0.5 MG PO TABS
0.5000 mg | ORAL_TABLET | Freq: Every day | ORAL | Status: DC
Start: 2015-09-12 — End: 2017-08-20

## 2015-09-12 NOTE — Progress Notes (Signed)
[place] [phone] Progress Note  Beth [name] [id number] 34 y.o.  09/12/2015 3:01 PM  Chief Complaint:  I like new medication.  However sometime I still have difficulty sleeping.  I still get anxious and nervous.  My primary care doctor gave me Ativan which is helping me.     History of Present Illness:  Beth [name] came for her follow-up appointment with her father.  On her last visit we started her on Risperdal which she had never tried before.  She like the Risperdal.  She sleeping better but she is still have nights when she is toss and turn and feeling overwhelmed.  She continues to have nightmares and flashback.  She does not feel Beth [name] help her a lot and now she had appointment with a new therapist but she do not remember the details.  She is tolerating Risperdal but admitted some time feel anxious and nervous and she has mild tremors.  Patient also has multiple sclerosis.  She recently seen her primary care physician and she was given Ativan which she is taking 0.5 mg as needed.  She is taking Zanaflex and coming off from gabapentin.  She had lost more than 10 pounds and she is very happy about it.  She is watching her calories and liked to stay on her low-carb diet.  Her relationship with the boyfriend is going well.  She still have nervousness, anxiety when she leave her house and around people.  She is taking Prozac and Lamictal.  She has no rash or itching.  She has no EPS.  Her primary care physician referred her to see a pain specialist but given hydrocodone until she see the pain specialist.  Her pain is somewhat better.  Patient denies drinking and she feels proud that since she left the hospital she has not relapsed into drinking.  Her energy level is okay.  She denies any hallucination, suicidal thoughts or homicidal thought.  She is living with her father who is very supportive.  Suicidal Ideation: No Plan Formed: No Patient has means to carry out plan: No  Homicidal  Ideation: No Plan Formed: No Patient has means to carry out plan: No  Past Psychiatric History/Hospitalization(s): Patient has at least 4 psychiatric hospitalization in this calendar year.   She was briefly admitted few ago at behavioral [facility] due to relapsed into drinking. In the past she admitted at behavioral [facility] and [facility].  [facility].  She is taking antianxiety medication for past few years.  She had tried Klonopin, Ativan, Prozac, Zoloft from her primary care physician.  She was given nortriptyline, BuSpar, trazodone, gabapentin, Zyprexa, naltrexone, Lamictal, Minipress, Neurontin, Atarax, Belsomra, Seroquel  and Abilify.  Abilify was stopped due to side effects.  She has extensive history of PTSD, depression and bipolar disorder.  She has history of suicidal thoughts.  She has extensive history of drinking alcohol, using cocaine and IV heroin. Anxiety: Yes Bipolar Disorder: Yes Depression: Yes Mania: Yes Psychosis: Yes Schizophrenia: No Personality Disorder: No Hospitalization for psychiatric illness: Yes History of Electroconvulsive Shock Therapy: No Prior Suicide Attempts: No  Medical History; Patient has multiple sclerosis, history of gastric bypass surgery, anemia, chronic back pain, history of back surgery.  Her primary care physician is Dr. Lottie Dawson.   Legal History; Patient has DWI charges.   Review of Systems  Constitutional: Positive for weight loss.  Cardiovascular: Negative for chest pain.  Musculoskeletal: Positive for  back pain.  Skin: Negative for itching and rash.  Neurological: Positive for tingling. Negative for dizziness and tremors.       Neuropathy  Psychiatric/Behavioral: The patient has insomnia.     Psychiatric: Agitation: No Hallucination: No Depressed Mood: No Insomnia: Yes Hypersomnia: No Altered Concentration: No Feels Worthless: No Grandiose Ideas: No Belief In  Special Powers: No New/Increased Substance Abuse: No Compulsions: No  Neurologic: Headache: No Seizure: No Paresthesias: Yes   Outpatient Encounter Prescriptions as of 09/12/2015  Medication Sig  . LORazepam (ATIVAN) 0.5 MG tablet Take 1 tablet up to twice daily as needed for anxiety/sleep.  . benztropine (COGENTIN) 0.5 MG tablet Take 1 tablet (0.5 mg total) by mouth at bedtime.  Marland Kitchen FLUoxetine (PROZAC) 40 MG capsule Take 1 capsule (40 mg total) by mouth at bedtime.  . lamoTRIgine (LAMICTAL) 200 MG tablet Take 1 tablet (200 mg total) by mouth daily.  . Multiple Vitamin (MULTIVITAMIN WITH MINERALS) TABS tablet Take 1 tablet by mouth daily.  Marland Kitchen omeprazole (PRILOSEC) 20 MG capsule Take 1 capsule (20 mg total) by mouth daily. For acid reflux  . risperiDONE (RISPERDAL) 1 MG tablet Take 1 tablet (1 mg total) by mouth at bedtime.  . SUMAtriptan (IMITREX) 50 MG tablet Take 1 tablet (50 mg total) by mouth every 2 (two) hours as needed for migraine or headache.  Marland Kitchen tiZANidine (ZANAFLEX) 4 MG tablet TAKE ONE TABLET (4 MG TOTAL) BY MOUTH 3 (THREE) TIMES A DAY.  . [DISCONTINUED] FLUoxetine (PROZAC) 40 MG capsule Take 1 capsule (40 mg total) by mouth at bedtime.  . [DISCONTINUED] gabapentin (NEURONTIN) 400 MG capsule Take 1 capsule (400 mg total) by mouth 4 (four) times daily. For agitation  . [DISCONTINUED] lamoTRIgine (LAMICTAL) 200 MG tablet Take 1 tablet (200 mg total) by mouth daily.  . [DISCONTINUED] methocarbamol (ROBAXIN) 500 MG tablet Take 1 tablet (500 mg total) by mouth 2 (two) times daily as needed for muscle spasms.  . [DISCONTINUED] risperiDONE (RISPERDAL) 1 MG tablet Take 1 tablet (1 mg total) by mouth at bedtime.  . [DISCONTINUED] traZODone (DESYREL) 50 MG tablet Take 1 tablet (50 mg total) by mouth at bedtime and may repeat dose one time if needed.   No facility-administered encounter medications on file as of 09/12/2015.    Recent Results (from the past 2160 hour(s))  Basic metabolic  panel     Status: Abnormal   Collection Time: 07/18/15 10:34 PM  Result Value Ref Range   Sodium 138 135 - 145 mmol/L   Potassium 3.1 (L) 3.5 - 5.1 mmol/L   Chloride 106 101 - 111 mmol/L   CO2 24 22 - 32 mmol/L   Glucose, Bld 82 65 - 99 mg/dL   BUN 7 6 - 20 mg/dL   Creatinine, Ser 0.84 0.44 - 1.00 mg/dL   Calcium 8.9 8.9 - 10.3 mg/dL   GFR calc non Af Amer >60 >60 mL/min   GFR calc Af Amer >60 >60 mL/min    Comment: (NOTE) The eGFR has been calculated using the CKD EPI equation. This calculation has not been validated in all clinical situations. eGFR's persistently <60 mL/min signify possible Chronic Kidney Disease.    Anion gap 8 5 - 15  CBC     Status: None   Collection Time: 07/18/15 10:34 PM  Result Value Ref Range   WBC 7.8 4.0 - 10.5 K/uL   RBC 4.43 3.87 - 5.11 MIL/uL   Hemoglobin 12.6 12.0 - 15.0 g/dL   HCT 37.8 36.0 -  46.0 %   MCV 85.3 78.0 - 100.0 fL   MCH 28.4 26.0 - 34.0 pg   MCHC 33.3 30.0 - 36.0 g/dL   RDW 13.9 11.5 - 15.5 %   Platelets 264 150 - 400 K/uL  I-stat troponin, ED     Status: None   Collection Time: 07/18/15 10:39 PM  Result Value Ref Range   Troponin i, poc 0.00 0.00 - 0.08 ng/mL   Comment 3            Comment: Due to the release kinetics of cTnI, a negative result within the first hours of the onset of symptoms does not rule out myocardial infarction with certainty. If myocardial infarction is still suspected, repeat the test at appropriate intervals.   Urinalysis, Routine w reflex microscopic (not at [facility])     Status: Abnormal   Collection Time: 07/18/15 11:37 PM  Result Value Ref Range   Color, Urine YELLOW YELLOW   APPearance CLEAR CLEAR   Specific Gravity, Urine 1.006 1.005 - 1.030   pH 6.5 5.0 - 8.0   Glucose, UA NEGATIVE NEGATIVE mg/dL   Hgb urine dipstick NEGATIVE NEGATIVE   Bilirubin Urine NEGATIVE NEGATIVE   Ketones, ur NEGATIVE NEGATIVE mg/dL   Protein, ur NEGATIVE NEGATIVE mg/dL   Urobilinogen, UA 0.2 0.0 - 1.0 mg/dL    Nitrite NEGATIVE NEGATIVE   Leukocytes, UA TRACE (A) NEGATIVE  Urine microscopic-add on     Status: None   Collection Time: 07/18/15 11:37 PM  Result Value Ref Range   Squamous Epithelial / LPF RARE RARE   WBC, UA 0-2 <3 WBC/hpf   Bacteria, UA RARE RARE  I-stat troponin, ED     Status: None   Collection Time: 07/19/15  1:38 AM  Result Value Ref Range   Troponin i, poc 0.01 0.00 - 0.08 ng/mL   Comment 3            Comment: Due to the release kinetics of cTnI, a negative result within the first hours of the onset of symptoms does not rule out myocardial infarction with certainty. If myocardial infarction is still suspected, repeat the test at appropriate intervals.   I-Stat beta hCG blood, ED ([facility], [facility], [facility] only)     Status: None   Collection Time: 08/15/15 10:27 PM  Result Value Ref Range   I-stat hCG, quantitative <5.0 <5 mIU/mL   Comment 3            Comment:   GEST. AGE      CONC.  (mIU/mL)   <=1 WEEK        5 - 50     2 WEEKS       50 - 500     3 WEEKS       100 - 10,000     4 WEEKS     1,000 - 30,000        FEMALE AND NON-PREGNANT FEMALE:     LESS THAN 5 mIU/mL   Comprehensive metabolic panel     Status: Abnormal   Collection Time: 08/15/15 10:29 PM  Result Value Ref Range   Sodium 132 (L) 135 - 145 mmol/L   Potassium 3.8 3.5 - 5.1 mmol/L   Chloride 100 (L) 101 - 111 mmol/L   CO2 23 22 - 32 mmol/L   Glucose, Bld 91 65 - 99 mg/dL   BUN <5 (L) 6 - 20 mg/dL   Creatinine, Ser 0.91 0.44 - 1.00 mg/dL   Calcium 8.8 (  L) 8.9 - 10.3 mg/dL   Total Protein 7.1 6.5 - 8.1 g/dL   Albumin 3.8 3.5 - 5.0 g/dL   AST 16 15 - 41 U/L   ALT 13 (L) 14 - 54 U/L   Alkaline Phosphatase 69 38 - 126 U/L   Total Bilirubin 0.3 0.3 - 1.2 mg/dL   GFR calc non Af Amer >60 >60 mL/min   GFR calc Af Amer >60 >60 mL/min    Comment: (NOTE) The eGFR has been calculated using the CKD EPI equation. This calculation has not been validated in all clinical situations. eGFR's persistently <60 mL/min signify  possible Chronic Kidney Disease.    Anion gap 9 5 - 15  CBC     Status: None   Collection Time: 08/15/15 10:29 PM  Result Value Ref Range   WBC 8.5 4.0 - 10.5 K/uL   RBC 4.84 3.87 - 5.11 MIL/uL   Hemoglobin 14.1 12.0 - 15.0 g/dL   HCT 42.0 36.0 - 46.0 %   MCV 86.8 78.0 - 100.0 fL   MCH 29.1 26.0 - 34.0 pg   MCHC 33.6 30.0 - 36.0 g/dL   RDW 13.9 11.5 - 15.5 %   Platelets 349 150 - 400 K/uL  Ethanol (ETOH)     Status: Abnormal   Collection Time: 08/15/15 10:30 PM  Result Value Ref Range   Alcohol, Ethyl (B) 204 (H) <5 mg/dL    Comment:        LOWEST DETECTABLE LIMIT FOR SERUM ALCOHOL IS 5 mg/dL FOR MEDICAL PURPOSES ONLY   Salicylate level     Status: None   Collection Time: 08/15/15 10:30 PM  Result Value Ref Range   Salicylate Lvl <6.7 2.8 - 30.0 mg/dL  Acetaminophen level     Status: Abnormal   Collection Time: 08/15/15 10:30 PM  Result Value Ref Range   Acetaminophen (Tylenol), Serum <10 (L) 10 - 30 ug/mL    Comment:        THERAPEUTIC CONCENTRATIONS VARY SIGNIFICANTLY. A RANGE OF 10-30 ug/mL MAY BE AN EFFECTIVE CONCENTRATION FOR MANY PATIENTS. HOWEVER, SOME ARE BEST TREATED AT CONCENTRATIONS OUTSIDE THIS RANGE. ACETAMINOPHEN CONCENTRATIONS >150 ug/mL AT 4 HOURS AFTER INGESTION AND >50 ug/mL AT 12 HOURS AFTER INGESTION ARE OFTEN ASSOCIATED WITH TOXIC REACTIONS.   Urine rapid drug screen (hosp performed) (Not at [facility])     Status: None   Collection Time: 08/15/15 10:31 PM  Result Value Ref Range   Opiates NONE DETECTED NONE DETECTED   Cocaine NONE DETECTED NONE DETECTED   Benzodiazepines NONE DETECTED NONE DETECTED   Amphetamines NONE DETECTED NONE DETECTED   Tetrahydrocannabinol NONE DETECTED NONE DETECTED   Barbiturates NONE DETECTED NONE DETECTED    Comment:        DRUG SCREEN FOR MEDICAL PURPOSES ONLY.  IF CONFIRMATION IS NEEDED FOR ANY PURPOSE, NOTIFY LAB WITHIN 5 DAYS.        LOWEST DETECTABLE LIMITS FOR URINE DRUG SCREEN Drug Class       Cutoff  (ng/mL) Amphetamine      1000 Barbiturate      200 Benzodiazepine   893 Tricyclics       810 Opiates          300 Cocaine          300 THC              50       Constitutional:  BP 133/87 mmHg  Pulse 69  Ht _0  (1.676 m)  Wt 256 lb 3.2 oz (116.212 kg)  BMI 41.37 kg/m2  LMP 08/18/2015   Musculoskeletal: Strength & Muscle Tone: within normal limits Gait & Station: normal Patient leans: N/A  Psychiatric Specialty Exam: General Appearance: Casual and Tired  Eye Contact::  Fair  Speech:  Slow  Volume:  Decreased  Mood:  Anxious  Affect:  Congruent  Thought Process:  Intact  Orientation:  Full (Time, Place, and Person)  Thought Content:  WDL  Suicidal Thoughts:  No  Homicidal Thoughts:  No  Memory:  Immediate;   Fair Recent;   Fair Remote;   Fair  Judgement:  Fair  Insight:  Fair  Psychomotor Activity:  Normal  Concentration:  Fair  Recall:  [employer] of Knowledge:  Fair  Language:  Fair  Akathisia:  No  Handed:  Right  AIMS (if indicated):     Assets:  Communication Skills  ADL's:  Intact  Cognition:  WNL  Sleep:        Established Problem, Stable/Improving (1), Review of Psycho-Social Stressors (1), Review or order clinical lab tests (1), Review and summation of old records (2), Review of Last Therapy Session (1), Review of Medication Regimen & Side Effects (2) and Review of New Medication or Change in Dosage (2)  Assessment: Axis I: Severe depression with psychosis, posttraumatic stress disorder, bipolar disorder with psychosis, alcohol dependence, opiate dependence  Axis II: Deferred  Axis III:  Past Medical History  Diagnosis Date  . Acid reflux   . Asthma   . History of blood transfusion   . Multiple sclerosis ([place]) 2005    diagnosed in [place], failed steroids, no other medication  . Anxiety   . Gastric ulcer   . Migraine   . Chronic nausea     self reported  . Chronic diarrhea     self reported  . Chronic pain     self reported  chronic pain, back pain, fibromyalgia  . Insomnia   . Polysubstance abuse   . ETOH abuse   . Delirium tremens ([place])   . Depression      Plan:  I reviewed records from her primary care physician Lottie Dawson .  She is taking hydrocodone and lorazepam from her.  Discussed benzodiazepine and narcotic pain medication in detail.  Patient has history of substance use .  She claimed that she's been sober from drinking since her last hospitalization.  She liked the Risperdal but she still have some anxiety.  I recommended to add Cogentin 0.5 mg at bedtime to help sleep, tremors and shakes.  I recommended to give more time to Risperdal and therapy.  At this time I will defer adding any other medication.  Continue Prozac 40 mg daily , Lamictal 200 mg daily .  She is taking multiple medication.  She is scheduled to see a pain specialist.  She also like to try a new therapist.  Discuss safety plan that anytime having active suicidal thoughts or homicidal thoughts and she need to call 911 or go to the local emergency room.  Follow-up in 6 weeks to 2 months.  Grayden Burley T., MD 09/12/2015

## 2015-09-16 ENCOUNTER — Ambulatory Visit (HOSPITAL_COMMUNITY): Payer: Self-pay | Admitting: Psychology

## 2015-09-18 ENCOUNTER — Other Ambulatory Visit (HOSPITAL_COMMUNITY): Payer: Self-pay | Admitting: Psychiatry

## 2015-09-25 ENCOUNTER — Other Ambulatory Visit (HOSPITAL_COMMUNITY): Payer: Self-pay | Admitting: Psychiatry

## 2015-09-28 ENCOUNTER — Emergency Department (HOSPITAL_COMMUNITY)
Admission: EM | Admit: 2015-09-28 | Discharge: 2015-09-29 | Disposition: A | Discharge status: Home / Self Care | Payer: BLUE CROSS/BLUE SHIELD | Attending: Emergency Medicine | Admitting: Emergency Medicine

## 2015-09-28 ENCOUNTER — Encounter (HOSPITAL_COMMUNITY): Payer: Self-pay | Admitting: Emergency Medicine

## 2015-09-28 DIAGNOSIS — G4700 Insomnia, unspecified: Secondary | ICD-10-CM | POA: Insufficient documentation

## 2015-09-28 DIAGNOSIS — Z8679 Personal history of other diseases of the circulatory system: Secondary | ICD-10-CM | POA: Insufficient documentation

## 2015-09-28 DIAGNOSIS — Z79899 Other long term (current) drug therapy: Secondary | ICD-10-CM | POA: Insufficient documentation

## 2015-09-28 DIAGNOSIS — F419 Anxiety disorder, unspecified: Secondary | ICD-10-CM | POA: Insufficient documentation

## 2015-09-28 DIAGNOSIS — J45909 Unspecified asthma, uncomplicated: Secondary | ICD-10-CM | POA: Diagnosis not present

## 2015-09-28 DIAGNOSIS — G8929 Other chronic pain: Secondary | ICD-10-CM | POA: Insufficient documentation

## 2015-09-28 DIAGNOSIS — F17210 Nicotine dependence, cigarettes, uncomplicated: Secondary | ICD-10-CM | POA: Diagnosis not present

## 2015-09-28 DIAGNOSIS — R112 Nausea with vomiting, unspecified: Secondary | ICD-10-CM | POA: Insufficient documentation

## 2015-09-28 DIAGNOSIS — R51 Headache: Secondary | ICD-10-CM | POA: Insufficient documentation

## 2015-09-28 DIAGNOSIS — F329 Major depressive disorder, single episode, unspecified: Secondary | ICD-10-CM | POA: Insufficient documentation

## 2015-09-28 DIAGNOSIS — K219 Gastro-esophageal reflux disease without esophagitis: Secondary | ICD-10-CM | POA: Diagnosis not present

## 2015-09-28 DIAGNOSIS — R519 Headache, unspecified: Secondary | ICD-10-CM

## 2015-09-28 NOTE — ED Notes (Signed)
Patient c/o migraine, onset at 0730 this am. Patient took Immitrex at 1830 and feels this has made her headache worse. Patient endorses sensitivity to light. Patient c/o vomiting x6 episodes.

## 2015-09-29 ENCOUNTER — Encounter (HOSPITAL_COMMUNITY): Payer: Self-pay | Admitting: Emergency Medicine

## 2015-09-29 MED ORDER — KETOROLAC TROMETHAMINE 30 MG/ML IJ SOLN
30.0000 mg | Freq: Once | INTRAMUSCULAR | Status: AC
  Administered 2015-09-29: 30 mg via INTRAVENOUS
  Filled 2015-09-29: qty 1

## 2015-09-29 MED ORDER — METOCLOPRAMIDE HCL 5 MG/ML IJ SOLN
10.0000 mg | Freq: Once | INTRAMUSCULAR | Status: AC
  Administered 2015-09-29: 10 mg via INTRAVENOUS
  Filled 2015-09-29: qty 2

## 2015-09-29 MED ORDER — METHYLPREDNISOLONE SODIUM SUCC 125 MG IJ SOLR
125.0000 mg | Freq: Once | INTRAMUSCULAR | Status: DC
  Filled 2015-09-29: qty 2

## 2015-09-29 MED ORDER — DIPHENHYDRAMINE HCL 50 MG/ML IJ SOLN
50.0000 mg | Freq: Once | INTRAMUSCULAR | Status: AC
  Administered 2015-09-29: 50 mg via INTRAVENOUS
  Filled 2015-09-29: qty 1

## 2015-09-29 MED ORDER — PROCHLORPERAZINE EDISYLATE 5 MG/ML IJ SOLN
10.0000 mg | Freq: Once | INTRAMUSCULAR | Status: AC
  Administered 2015-09-29: 10 mg via INTRAVENOUS
  Filled 2015-09-29: qty 2

## 2015-09-29 NOTE — Discharge Instructions (Signed)
General Headache Without Cause Beth [name], see your primary care doctor within 3 days for close follow up.  If any symptoms worsen, come back to the ED immediately. Thank you. A headache is pain or discomfort felt around the head or neck area. There are many causes and types of headaches. In some cases, the cause may not be found.  HOME CARE  Managing Pain  Take over-the-counter and prescription medicines only as told by your doctor.  Lie down in a dark, quiet room when you have a headache.  If directed, apply ice to the head and neck area:  Put ice in a plastic bag.  Place a towel between your skin and the bag.  Leave the ice on for 20 minutes, 2-3 times per day.  Use a heating pad or hot shower to apply heat to the head and neck area as told by your doctor.  Keep lights dim if bright lights bother you or make your headaches worse. Eating and Drinking  Eat meals on a regular schedule.  Lessen how much alcohol you drink.  Lessen how much caffeine you drink, or stop drinking caffeine. General Instructions  Keep all follow-up visits as told by your doctor. This is important.  Keep a journal to find out if certain things bring on headaches. For example, write down:  What you eat and drink.  How much sleep you get.  Any change to your diet or medicines.  Relax by getting a massage or doing other relaxing activities.  Lessen stress.  Sit up straight. Do not tighten (tense) your muscles.  Do not use tobacco products. This includes cigarettes, chewing tobacco, or e-cigarettes. If you need help quitting, ask your doctor.  Exercise regularly as told by your doctor.  Get enough sleep. This often means 7-9 hours of sleep. GET HELP IF:  Your symptoms are not helped by medicine.  You have a headache that feels different than the other headaches.  You feel sick to your stomach (nauseous) or you throw up (vomit).  You have a fever. GET HELP RIGHT AWAY IF:   Your headache  becomes really bad.  You keep throwing up.  You have a stiff neck.  You have trouble seeing.  You have trouble speaking.  You have pain in the eye or ear.  Your muscles are weak or you lose muscle control.  You lose your balance or have trouble walking.  You feel like you will pass out (faint) or you pass out.  You have confusion.   This information is not intended to replace advice given to you by your health care provider. Make sure you discuss any questions you have with your health care provider.   Document Released: 07/24/2008 Document Revised: 07/06/2015 Document Reviewed: 02/07/2015 Elsevier Interactive Patient Education [employer]

## 2015-09-29 NOTE — ED Provider Notes (Signed)
CSN: [id number]     Arrival date & time 09/28/15  2333 History  By signing my name below, I, Lyndel Safe, attest that this documentation has been prepared under the direction and in the presence of Tomasita Crumble, MD. Electronically Signed: Lyndel Safe, ED Scribe. 09/29/2015. 1:32 AM.    Chief Complaint  Patient presents with  . Migraine    The history is provided by the patient. No language interpreter was used.   HPI Comments: Beth [name] is a 34 y.o. female, with a h/o migraines,  who presents to the Emergency Department complaining of a constant, severe, worsening diffuse frontal, throbbing and stabbing headache onset 18 hours ago with associated nausea and 6 episodes of emesis. Her current headache has been unrelieved by Imitrex and Norco with last dose of norco taken 13 hours ago. She notes a h/o migraines but states her current headache is more severe than her normal migraines. The pt has not been evaluated in the ED for her migraines prior to this visit. Denies URI symptoms and sick contacts.   Past Medical History  Diagnosis Date  . Acid reflux   . Asthma   . History of blood transfusion   . Multiple sclerosis (HCC) 2005    diagnosed in [place], failed steroids, no other medication  . Anxiety   . Gastric ulcer   . Migraine   . Chronic nausea     self reported  . Chronic diarrhea     self reported  . Chronic pain     self reported chronic pain, back pain, fibromyalgia  . Insomnia   . Polysubstance abuse   . ETOH abuse   . Delirium tremens (HCC)   . Depression    Past Surgical History  Procedure Laterality Date  . Gastric bypass    . Diagnostic laparoscopy  2003    repair of perforation at [facility] anastamosis   . Lumbar laminectomy/decompression microdiscectomy Right 04/14/2014    Procedure: LUMBAR LAMINECTOMY/DECOMPRESSION MICRODISCECTOMY LEVEL L4-5;  Surgeon: Maeola Harman, MD;  Location: [facility];  Service: Neurosurgery;  Laterality: Right;  LUMBAR  LAMINECTOMY/DECOMPRESSION MICRODISCECTOMY LEVEL L4-5  . Laparotomy N/A 08/18/2014    Procedure: EXPLORATORY LAPAROTOMY, Patch graft of gastric perforation;  Surgeon: Abigail Miyamoto, MD;  Location: [facility];  Service: General;  Laterality: N/A;   Family History  Problem Relation Age of Onset  . Hypertension Paternal Grandfather   . Colon cancer Paternal Grandfather 88  . Asthma Paternal Grandmother   . Hypertension Paternal Grandmother   . Breast cancer Paternal Grandmother   . Cancer Paternal Grandmother     lung   . Diabetes Maternal Grandmother   . Diabetes Maternal Grandfather   . Hypertension Father   . Alcohol abuse Father   . Urinary tract infection Mother   . Stroke Mother 73    3 strokes    Social History  Substance Use Topics  . Smoking status: Current Some Day Smoker -- 0.20 packs/day    Types: Cigarettes  . Smokeless tobacco: Never Used  . Alcohol Use: No     Comment: hx drinking 1 pt of vodka and 2 24 oz beers   OB History    Gravida Para Term Preterm AB TAB SAB Ectopic Multiple Living   0         0     Review of Systems  Constitutional: Negative for fever.  HENT: Negative for congestion, rhinorrhea and sore throat.   Respiratory: Negative for cough.   Gastrointestinal:  Positive for nausea and vomiting.  Neurological: Positive for headaches.  10 Systems reviewed and all are negative for acute change except as noted in the HPI.  Allergies  Cymbalta; Dilaudid; Abilify; Meperidine; Naltrexone; and Prednisone  Home Medications   Prior to Admission medications   Medication Sig Start Date End Date Taking? Authorizing Provider  benztropine (COGENTIN) 0.5 MG tablet Take 1 tablet (0.5 mg total) by mouth at bedtime. 09/12/15 09/11/16 Yes Cleotis Nipper, MD  ferrous sulfate 325 (65 FE) MG tablet Take 325 mg by mouth 2 (two) times daily.   Yes Historical Provider, MD  FLUoxetine (PROZAC) 40 MG capsule Take 1 capsule (40 mg total) by mouth at bedtime. 09/12/15  Yes  Cleotis Nipper, MD  lamoTRIgine (LAMICTAL) 200 MG tablet Take 1 tablet (200 mg total) by mouth daily. 09/12/15  Yes Cleotis Nipper, MD  LORazepam (ATIVAN) 0.5 MG tablet Take 1 tablet up to twice daily as needed for anxiety/sleep. 08/30/15  Yes Historical Provider, MD  methocarbamol (ROBAXIN) 500 MG tablet Take 500 mg by mouth 4 (four) times daily as needed for muscle spasms.    Yes Historical Provider, MD  Multiple Vitamin (MULTIVITAMIN WITH MINERALS) TABS tablet Take 1 tablet by mouth daily. 08/16/15  Yes Adonis Brook, NP  omeprazole (PRILOSEC) 20 MG capsule Take 1 capsule (20 mg total) by mouth daily. For acid reflux 05/30/15  Yes Sanjuana Kava, NP  ranitidine (ZANTAC) 150 MG capsule Take 150 mg by mouth daily.   Yes Historical Provider, MD  risperiDONE (RISPERDAL) 1 MG tablet Take 1 tablet (1 mg total) by mouth at bedtime. 09/12/15 09/11/16 Yes Cleotis Nipper, MD  SUMAtriptan (IMITREX) 50 MG tablet Take 1 tablet (50 mg total) by mouth every 2 (two) hours as needed for migraine or headache. 05/30/15  Yes Sanjuana Kava, NP  tiZANidine (ZANAFLEX) 4 MG tablet TAKE ONE TABLET (4 MG TOTAL) BY MOUTH 3 (THREE) TIMES A DAY. 08/30/15  Yes Historical Provider, MD   BP 115/69 mmHg  Pulse 77  Temp(Src) 97.9 F (36.6 C) (Oral)  Resp 18  SpO2 98%  LMP 09/19/2015 (Exact Date) Physical Exam  Constitutional: She is oriented to person, place, and time. She appears well-developed and well-nourished. No distress.  HENT:  Head: Normocephalic and atraumatic.  Nose: Nose normal.  Mouth/Throat: Oropharynx is clear and moist. No oropharyngeal exudate.  Eyes: Conjunctivae and EOM are normal. Pupils are equal, round, and reactive to light. No scleral icterus.  Neck: Normal range of motion. Neck supple. No JVD present. No tracheal deviation present. No thyromegaly present.  Cardiovascular: Normal rate, regular rhythm and normal heart sounds.  Exam reveals no gallop and no friction rub.   No murmur heard. Pulmonary/Chest:  Effort normal and breath sounds normal. No respiratory distress. She has no wheezes. She exhibits no tenderness.  Abdominal: Soft. Bowel sounds are normal. She exhibits no distension and no mass. There is no tenderness. There is no rebound and no guarding.  Musculoskeletal: Normal range of motion. She exhibits no edema or tenderness.  Lymphadenopathy:    She has no cervical adenopathy.  Neurological: She is alert and oriented to person, place, and time. No cranial nerve deficit. She exhibits normal muscle tone.  Normal strength and sensation in all extremities. Normal cerebellar testing.  Skin: Skin is warm and dry. No rash noted. No erythema. No pallor.  Nursing note and vitals reviewed.   ED Course  Procedures  DIAGNOSTIC STUDIES: Oxygen Saturation is 98% on RA,  normal by my interpretation.    COORDINATION OF CARE: 1:29 AM Discussed treatment plan which includes to order alleviating medication with pt. Pt acknowledges and agrees to plan.   Labs Review Labs Reviewed - No data to display  Imaging Review No results found. I have personally reviewed and evaluated these images and lab results as part of my medical decision-making.   EKG Interpretation None      MDM   Final diagnoses:  None    Patient presents to the ED for headache she states is consistent with her migraines.  Neurological exam is completely normal in the ED.  She was treated with toradol, reglan, and benadryl.  Plan to reassess.  I doubt serious intracranial pathology given her history.  Upon repeat evaluation, patient feels better, down to a 5.  Will redose with compazine.  She refuses steroid treatment.  She appears well in the ED and in NAD.  Episode oh hypotension was during sleep.  VS other wise remain within her normal limits and she is safe for DC.    I personally performed the services described in this documentation, which was scribed in my presence. The recorded information has been reviewed and is  accurate.     Tomasita Crumble, MD 09/29/15 [phone]

## 2015-10-02 ENCOUNTER — Encounter (HOSPITAL_COMMUNITY): Payer: Self-pay | Admitting: Emergency Medicine

## 2015-10-02 ENCOUNTER — Emergency Department (HOSPITAL_COMMUNITY): Payer: BLUE CROSS/BLUE SHIELD

## 2015-10-02 ENCOUNTER — Emergency Department (HOSPITAL_COMMUNITY)
Admission: EM | Admit: 2015-10-02 | Discharge: 2015-10-02 | Disposition: A | Discharge status: Home / Self Care | Payer: BLUE CROSS/BLUE SHIELD | Attending: Emergency Medicine | Admitting: Emergency Medicine

## 2015-10-02 DIAGNOSIS — F329 Major depressive disorder, single episode, unspecified: Secondary | ICD-10-CM | POA: Insufficient documentation

## 2015-10-02 DIAGNOSIS — G8929 Other chronic pain: Secondary | ICD-10-CM | POA: Insufficient documentation

## 2015-10-02 DIAGNOSIS — G43909 Migraine, unspecified, not intractable, without status migrainosus: Secondary | ICD-10-CM | POA: Diagnosis not present

## 2015-10-02 DIAGNOSIS — S7002XA Contusion of left hip, initial encounter: Secondary | ICD-10-CM | POA: Diagnosis not present

## 2015-10-02 DIAGNOSIS — W19XXXA Unspecified fall, initial encounter: Secondary | ICD-10-CM

## 2015-10-02 DIAGNOSIS — Y998 Other external cause status: Secondary | ICD-10-CM | POA: Diagnosis not present

## 2015-10-02 DIAGNOSIS — Y9389 Activity, other specified: Secondary | ICD-10-CM | POA: Diagnosis not present

## 2015-10-02 DIAGNOSIS — Y92009 Unspecified place in unspecified non-institutional (private) residence as the place of occurrence of the external cause: Secondary | ICD-10-CM | POA: Insufficient documentation

## 2015-10-02 DIAGNOSIS — K219 Gastro-esophageal reflux disease without esophagitis: Secondary | ICD-10-CM | POA: Diagnosis not present

## 2015-10-02 DIAGNOSIS — F17210 Nicotine dependence, cigarettes, uncomplicated: Secondary | ICD-10-CM | POA: Insufficient documentation

## 2015-10-02 DIAGNOSIS — S20211A Contusion of right front wall of thorax, initial encounter: Secondary | ICD-10-CM | POA: Diagnosis not present

## 2015-10-02 DIAGNOSIS — F419 Anxiety disorder, unspecified: Secondary | ICD-10-CM | POA: Insufficient documentation

## 2015-10-02 DIAGNOSIS — Z79899 Other long term (current) drug therapy: Secondary | ICD-10-CM | POA: Insufficient documentation

## 2015-10-02 DIAGNOSIS — S60512A Abrasion of left hand, initial encounter: Secondary | ICD-10-CM | POA: Diagnosis not present

## 2015-10-02 DIAGNOSIS — W1839XA Other fall on same level, initial encounter: Secondary | ICD-10-CM | POA: Insufficient documentation

## 2015-10-02 DIAGNOSIS — S299XXA Unspecified injury of thorax, initial encounter: Secondary | ICD-10-CM | POA: Diagnosis present

## 2015-10-02 DIAGNOSIS — J45909 Unspecified asthma, uncomplicated: Secondary | ICD-10-CM | POA: Insufficient documentation

## 2015-10-02 DIAGNOSIS — S60511A Abrasion of right hand, initial encounter: Secondary | ICD-10-CM | POA: Insufficient documentation

## 2015-10-02 DIAGNOSIS — G4700 Insomnia, unspecified: Secondary | ICD-10-CM | POA: Insufficient documentation

## 2015-10-02 MED ORDER — MORPHINE SULFATE (PF) 4 MG/ML IV SOLN
4.0000 mg | Freq: Once | INTRAVENOUS | Status: AC
  Administered 2015-10-02: 4 mg via INTRAVENOUS
  Filled 2015-10-02: qty 1

## 2015-10-02 MED ORDER — ONDANSETRON HCL 4 MG/2ML IJ SOLN
4.0000 mg | Freq: Once | INTRAMUSCULAR | Status: AC
  Administered 2015-10-02: 4 mg via INTRAVENOUS
  Filled 2015-10-02: qty 2

## 2015-10-02 MED ORDER — OXYCODONE-ACETAMINOPHEN 5-325 MG PO TABS
1.0000 | ORAL_TABLET | Freq: Once | ORAL | Status: AC
Start: 2015-10-02 — End: 2015-10-02
  Administered 2015-10-02: 1 via ORAL
  Filled 2015-10-02: qty 1

## 2015-10-02 MED ORDER — OXYCODONE-ACETAMINOPHEN 5-325 MG PO TABS: 1.0000 | ORAL_TABLET | ORAL | Status: DC | PRN

## 2015-10-02 NOTE — Discharge Instructions (Signed)
Chest Contusion °A contusion is a deep bruise. Bruises happen when an injury causes bleeding under the skin. Signs of bruising include pain, puffiness (swelling), and discolored skin. The bruise may turn blue, purple, or yellow.  °HOME CARE °· Put ice on the injured area. °¨ Put ice in a plastic bag. °¨ Place a towel between the skin and the bag. °¨ Leave the ice on for 15-20 minutes at a time, 03-04 times a day for the first 48 hours. °· Only take medicine as told by your doctor. °· Rest. °· Take deep breaths (deep-breathing exercises) as told by your doctor. °· Stop smoking if you smoke. °· Do not lift objects over 5 pounds (2.3 kilograms) for 3 days or longer if told by your doctor. °GET HELP RIGHT AWAY IF:  °· You have more bruising or puffiness. °· You have pain that gets worse. °· You have trouble breathing. °· You are dizzy, weak, or pass out (faint). °· You have blood in your pee (urine) or poop (stool). °· You cough up or throw up (vomit) blood. °· Your puffiness or pain is not helped with medicines. °MAKE SURE YOU:  °· Understand these instructions. °· Will watch your condition. °· Will get help right away if you are not doing well or get worse. °  °This information is not intended to replace advice given to you by your health care provider. Make sure you discuss any questions you have with your health care provider. °  °Document Released: 04/02/2008 Document Revised: 07/09/2012 Document Reviewed: 04/07/2012 °Elsevier Interactive Patient Education ©2016 [employer] ° °

## 2015-10-02 NOTE — ED Notes (Signed)
Patient fell into [place] at home right flank and right hip hurts.

## 2015-10-02 NOTE — ED Notes (Signed)
Awake. Verbally responsive. A/O x4. Resp even and unlabored. No audible adventitious breath sounds noted. ABC's intact.  

## 2015-10-02 NOTE — ED Notes (Signed)
Pt from home c/o left hip pain and right rib pain from a fall last night. She reports she has MS and her legs locked up. Denies LOC. Denies taking anything for pain at home.

## 2015-10-02 NOTE — ED Notes (Addendum)
Awake. Verbally responsive. A/O x4. Resp even and unlabored. No audible adventitious breath sounds noted. ABC's intact. Pt reported to continue to have pain to rt hip and rt lateral torso area. IV saline lock patent and intact.

## 2015-10-02 NOTE — ED Provider Notes (Signed)
CSN: [id number]     Arrival date & time 10/02/15  4098 History   First MD Initiated Contact with Patient 10/02/15 0544     Chief Complaint  Patient presents with  . Fall     (Consider location/radiation/quality/duration/timing/severity/associated sxs/prior Treatment) HPI Comments: Patient presents to the ER for evaluation of injuries from a fall. Patient patient has a history of MS. She reports that she had a mechanical fall based on the weakness in her legs. She fell into a rose bush, suffering scratches on her arms. Patient complaining of pain in the left hip area as well as the right ribs. There was no head injury or impact. Patient is not expressing any neck or back pain. Pain is moderate, constant and worsens with movement.  Patient is a 34 y.o. female presenting with fall.  Fall    Past Medical History  Diagnosis Date  . Acid reflux   . Asthma   . History of blood transfusion   . Multiple sclerosis (HCC) 2005    diagnosed in [place], failed steroids, no other medication  . Anxiety   . Gastric ulcer   . Migraine   . Chronic nausea     self reported  . Chronic diarrhea     self reported  . Chronic pain     self reported chronic pain, back pain, fibromyalgia  . Insomnia   . Polysubstance abuse   . ETOH abuse   . Delirium tremens (HCC)   . Depression    Past Surgical History  Procedure Laterality Date  . Gastric bypass    . Diagnostic laparoscopy  2003    repair of perforation at [facility] anastamosis   . Lumbar laminectomy/decompression microdiscectomy Right 04/14/2014    Procedure: LUMBAR LAMINECTOMY/DECOMPRESSION MICRODISCECTOMY LEVEL L4-5;  Surgeon: Maeola Harman, MD;  Location: [facility];  Service: Neurosurgery;  Laterality: Right;  LUMBAR LAMINECTOMY/DECOMPRESSION MICRODISCECTOMY LEVEL L4-5  . Laparotomy N/A 08/18/2014    Procedure: EXPLORATORY LAPAROTOMY, Patch graft of gastric perforation;  Surgeon: Abigail Miyamoto, MD;  Location: [facility];  Service: General;   Laterality: N/A;   Family History  Problem Relation Age of Onset  . Hypertension Paternal Grandfather   . Colon cancer Paternal Grandfather 97  . Asthma Paternal Grandmother   . Hypertension Paternal Grandmother   . Breast cancer Paternal Grandmother   . Cancer Paternal Grandmother     lung   . Diabetes Maternal Grandmother   . Diabetes Maternal Grandfather   . Hypertension Father   . Alcohol abuse Father   . Urinary tract infection Mother   . Stroke Mother 58    3 strokes    Social History  Substance Use Topics  . Smoking status: Current Some Day Smoker -- 0.20 packs/day    Types: Cigarettes  . Smokeless tobacco: Never Used  . Alcohol Use: No     Comment: hx drinking 1 pt of vodka and 2 24 oz beers   OB History    Gravida Para Term Preterm AB TAB SAB Ectopic Multiple Living   0         0     Review of Systems  Musculoskeletal:       Right rib pain Left hip pain  All other systems reviewed and are negative.     Allergies  Cymbalta; Dilaudid; Abilify; Meperidine; Naltrexone; and Prednisone  Home Medications   Prior to Admission medications   Medication Sig Start Date End Date Taking? Authorizing Provider  benztropine (COGENTIN) 0.5 MG tablet  Take 1 tablet (0.5 mg total) by mouth at bedtime. 09/12/15 09/11/16 Yes Cleotis Nipper, MD  ferrous sulfate 325 (65 FE) MG tablet Take 325 mg by mouth 2 (two) times daily.   Yes Historical Provider, MD  FLUoxetine (PROZAC) 40 MG capsule Take 1 capsule (40 mg total) by mouth at bedtime. 09/12/15  Yes Cleotis Nipper, MD  lamoTRIgine (LAMICTAL) 200 MG tablet Take 1 tablet (200 mg total) by mouth daily. 09/12/15  Yes Cleotis Nipper, MD  LORazepam (ATIVAN) 0.5 MG tablet Take 1 tablet up to twice daily as needed for anxiety/sleep. 08/30/15  Yes Historical Provider, MD  methocarbamol (ROBAXIN) 500 MG tablet Take 500 mg by mouth 4 (four) times daily as needed for muscle spasms.    Yes Historical Provider, MD  Multiple Vitamin  (MULTIVITAMIN WITH MINERALS) TABS tablet Take 1 tablet by mouth daily. 08/16/15  Yes Adonis Brook, NP  Multiple Vitamins-Minerals (HAIR/SKIN/NAILS PO) Take 1 tablet by mouth daily.   Yes Historical Provider, MD  omeprazole (PRILOSEC) 20 MG capsule Take 1 capsule (20 mg total) by mouth daily. For acid reflux 05/30/15  Yes Sanjuana Kava, NP  ranitidine (ZANTAC) 150 MG capsule Take 150 mg by mouth daily.   Yes Historical Provider, MD  risperiDONE (RISPERDAL) 1 MG tablet Take 1 tablet (1 mg total) by mouth at bedtime. 09/12/15 09/11/16 Yes Cleotis Nipper, MD  SUMAtriptan (IMITREX) 50 MG tablet Take 1 tablet (50 mg total) by mouth every 2 (two) hours as needed for migraine or headache. 05/30/15  Yes Sanjuana Kava, NP  tiZANidine (ZANAFLEX) 4 MG tablet TAKE ONE TABLET (4 MG TOTAL) BY MOUTH 3 (THREE) TIMES A DAY. 08/30/15  Yes Historical Provider, MD  oxyCODONE-acetaminophen (PERCOCET) 5-325 MG tablet Take 1-2 tablets by mouth every 4 (four) hours as needed. 10/02/15   Gilda Crease, MD   BP 109/63 mmHg  Pulse 82  Temp(Src) 97.9 F (36.6 C) (Oral)  Resp 18  Ht  (1.676 m)  Wt 254 lb (115.214 kg)  BMI 41.02 kg/m2  SpO2 97%  LMP 09/19/2015 (Exact Date) Physical Exam  Constitutional: She is oriented to person, place, and time. She appears well-developed and well-nourished. No distress.  HENT:  Head: Normocephalic and atraumatic.  Right Ear: Hearing normal.  Left Ear: Hearing normal.  Nose: Nose normal.  Mouth/Throat: Oropharynx is clear and moist and mucous membranes are normal.  Eyes: Conjunctivae and EOM are normal. Pupils are equal, round, and reactive to light.  Neck: Normal range of motion. Neck supple.  Cardiovascular: Regular rhythm, S1 normal and S2 normal.  Exam reveals no gallop and no friction rub.   No murmur heard. Pulmonary/Chest: Effort normal and breath sounds normal. No respiratory distress. She exhibits tenderness. She exhibits no crepitus.    Abdominal: Soft. Normal  appearance and bowel sounds are normal. There is no hepatosplenomegaly. There is no tenderness. There is no rebound, no guarding, no tenderness at McBurney's point and negative Murphy's sign. No hernia.  Musculoskeletal: Normal range of motion.       Left hip: She exhibits tenderness (Lateral and posterior hip). She exhibits normal range of motion and normal strength.  Neurological: She is alert and oriented to person, place, and time. She has normal strength. No cranial nerve deficit or sensory deficit. Coordination normal. GCS eye subscore is 4. GCS verbal subscore is 5. GCS motor subscore is 6.  Skin: Skin is warm, dry and intact. No rash noted. No cyanosis.  Multiple linear  abrasions on upper extremities  Psychiatric: She has a normal mood and affect. Her speech is normal and behavior is normal. Thought content normal.  Nursing note and vitals reviewed.   ED Course  Procedures (including critical care time) Labs Review Labs Reviewed - No data to display  Imaging Review Dg Ribs Unilateral W/chest Right  10/02/2015  CLINICAL DATA:  Right lateral rib and hip pain after falling last night. EXAM: RIGHT RIBS AND CHEST - 3+ VIEW COMPARISON:  Chest radiographs 07/18/2015 and 03/10/2015. FINDINGS: The heart size and mediastinal contours are normal. The lungs are clear. There is no pleural effusion or pneumothorax. No acute osseous findings are identified. No evidence of rib fracture or foreign body. IMPRESSION: No evidence of right-sided rib fracture, pleural effusion or pneumothorax. Electronically Signed   By: Carey Bullocks M.D.   On: 10/02/2015 07:39   Dg Hip Unilat With Pelvis 2-3 Views Left  10/02/2015  CLINICAL DATA:  Left hip pain.  Fell last night. EXAM: DG HIP (WITH OR WITHOUT PELVIS) 2-3V LEFT COMPARISON:  None. FINDINGS: There is no evidence of hip fracture or dislocation. There is no evidence of arthropathy or other focal bone abnormality. IMPRESSION: Negative. Electronically Signed    By: Ellery Plunk M.D.   On: 10/02/2015 04:52   I have personally reviewed and evaluated these images and lab results as part of my medical decision-making.   EKG Interpretation None      MDM   Final diagnoses:  Chest wall contusion, right, initial encounter  Contusion, hip, left, initial encounter    Presented for evaluation of injuries from a mechanical fall. Patient reports that she fell over when her leg gave out secondary to her MS. She fell and tore [place] suffering superficial abrasions. She landed on her right side, injuring her right ribs. She is also complaining of left hip pain. Pain in the hip is in the lateral portion of the hip and posterior aspect, likely soft tissues. She has normal range of motion. X-ray of hip was negative. Patient also underwent x-ray of ribs and chest, no acute [place] noted. Patient treated with analgesia.    Gilda Crease, MD 10/02/15 [phone]

## 2015-10-02 NOTE — ED Notes (Signed)
Patient ready for xray

## 2015-10-03 ENCOUNTER — Other Ambulatory Visit (HOSPITAL_COMMUNITY): Payer: Self-pay | Admitting: Psychiatry

## 2015-10-04 ENCOUNTER — Telehealth (HOSPITAL_COMMUNITY): Payer: Self-pay

## 2015-10-04 ENCOUNTER — Other Ambulatory Visit (HOSPITAL_COMMUNITY): Payer: Self-pay | Admitting: Psychiatry

## 2015-10-04 NOTE — Telephone Encounter (Signed)
Telephone message left for patient after she left one questioning why her medication was denied refills from her pharmacy but did not specify which medications.  Informed on message Dr. Lolly Mustache had e-scribed new Cogentin, Lamictal, Prozac and Risperdal to her [facility] on [address] on 09/12/15, all with one refill a piece.  Requested patient call her pharmacy to follow up and see if these were on file if this was the medication she was looking for needing to fill.  Requested patient call this nurse back if some other medication is needed?

## 2015-10-21 ENCOUNTER — Other Ambulatory Visit (HOSPITAL_COMMUNITY): Payer: Self-pay | Admitting: Psychiatry

## 2015-10-27 ENCOUNTER — Emergency Department (HOSPITAL_COMMUNITY)
Admission: EM | Admit: 2015-10-27 | Discharge: 2015-10-27 | Disposition: A | Discharge status: Home / Self Care | Payer: BLUE CROSS/BLUE SHIELD | Attending: Emergency Medicine | Admitting: Emergency Medicine

## 2015-10-27 ENCOUNTER — Emergency Department (HOSPITAL_COMMUNITY): Payer: BLUE CROSS/BLUE SHIELD

## 2015-10-27 ENCOUNTER — Encounter (HOSPITAL_COMMUNITY): Payer: Self-pay | Admitting: Emergency Medicine

## 2015-10-27 DIAGNOSIS — G43909 Migraine, unspecified, not intractable, without status migrainosus: Secondary | ICD-10-CM | POA: Insufficient documentation

## 2015-10-27 DIAGNOSIS — Y9389 Activity, other specified: Secondary | ICD-10-CM | POA: Diagnosis not present

## 2015-10-27 DIAGNOSIS — Z8669 Personal history of other diseases of the nervous system and sense organs: Secondary | ICD-10-CM | POA: Diagnosis not present

## 2015-10-27 DIAGNOSIS — K219 Gastro-esophageal reflux disease without esophagitis: Secondary | ICD-10-CM | POA: Insufficient documentation

## 2015-10-27 DIAGNOSIS — S92511A Displaced fracture of proximal phalanx of right lesser toe(s), initial encounter for closed fracture: Secondary | ICD-10-CM | POA: Diagnosis not present

## 2015-10-27 DIAGNOSIS — S92911A Unspecified fracture of right toe(s), initial encounter for closed fracture: Secondary | ICD-10-CM

## 2015-10-27 DIAGNOSIS — J45909 Unspecified asthma, uncomplicated: Secondary | ICD-10-CM | POA: Insufficient documentation

## 2015-10-27 DIAGNOSIS — Z79899 Other long term (current) drug therapy: Secondary | ICD-10-CM | POA: Insufficient documentation

## 2015-10-27 DIAGNOSIS — S93402A Sprain of unspecified ligament of left ankle, initial encounter: Secondary | ICD-10-CM | POA: Diagnosis not present

## 2015-10-27 DIAGNOSIS — W1839XA Other fall on same level, initial encounter: Secondary | ICD-10-CM | POA: Insufficient documentation

## 2015-10-27 DIAGNOSIS — F329 Major depressive disorder, single episode, unspecified: Secondary | ICD-10-CM | POA: Diagnosis not present

## 2015-10-27 DIAGNOSIS — F17210 Nicotine dependence, cigarettes, uncomplicated: Secondary | ICD-10-CM | POA: Insufficient documentation

## 2015-10-27 DIAGNOSIS — S29001A Unspecified injury of muscle and tendon of front wall of thorax, initial encounter: Secondary | ICD-10-CM | POA: Diagnosis not present

## 2015-10-27 DIAGNOSIS — F419 Anxiety disorder, unspecified: Secondary | ICD-10-CM | POA: Insufficient documentation

## 2015-10-27 DIAGNOSIS — R0789 Other chest pain: Secondary | ICD-10-CM

## 2015-10-27 DIAGNOSIS — G8929 Other chronic pain: Secondary | ICD-10-CM | POA: Insufficient documentation

## 2015-10-27 DIAGNOSIS — Y998 Other external cause status: Secondary | ICD-10-CM | POA: Diagnosis not present

## 2015-10-27 DIAGNOSIS — S99921A Unspecified injury of right foot, initial encounter: Secondary | ICD-10-CM | POA: Diagnosis present

## 2015-10-27 DIAGNOSIS — S0081XA Abrasion of other part of head, initial encounter: Secondary | ICD-10-CM | POA: Diagnosis not present

## 2015-10-27 DIAGNOSIS — Y9289 Other specified places as the place of occurrence of the external cause: Secondary | ICD-10-CM | POA: Insufficient documentation

## 2015-10-27 MED ORDER — NAPROXEN 500 MG PO TABS: 500.0000 mg | ORAL_TABLET | Freq: Two times a day (BID) | ORAL | Status: DC

## 2015-10-27 MED ORDER — HYDROCODONE-ACETAMINOPHEN 5-325 MG PO TABS: ORAL_TABLET | ORAL | Status: DC

## 2015-10-27 NOTE — ED Provider Notes (Signed)
CSN: [id number]     Arrival date & time 10/27/15  1528 History  By signing my name below, I, Evon Slack, attest that this documentation has been prepared under the direction and in the presence of Renne Crigler, PA-C. Electronically Signed: Evon Slack, ED Scribe. 10/27/2015. 3:50 PM.    Chief Complaint  Patient presents with  . Toe Pain   The history is provided by the patient. No language interpreter was used.   HPI Comments: Beth [name] is a 34 y.o. female with history of multiple sclerosis who presents to the Emergency Department complaining of fall onset 1 week prior. Pt states that she was getting out of a truck she lost her balance and fell face forward down a hill landing on concrete face first. Pt presents with small abrasions on the face. Pt is complaining of right 2nd toe pain, left ankle pain, and rib pain. She states that the left ankle pain is worse when ambulating. Pt states that lying down and rolling over makes her rib pain worse. Pt states that she has been taking Aleve with no relief. Pt denies any numbness or tingling. Pt did receive #30 Vicodin 5-325 from her PCP on 10/10/2015.    Past Medical History  Diagnosis Date  . Acid reflux   . Asthma   . History of blood transfusion   . Multiple sclerosis (HCC) 2005    diagnosed in [place], failed steroids, no other medication  . Anxiety   . Gastric ulcer   . Migraine   . Chronic nausea     self reported  . Chronic diarrhea     self reported  . Chronic pain     self reported chronic pain, back pain, fibromyalgia  . Insomnia   . Polysubstance abuse   . ETOH abuse   . Delirium tremens (HCC)   . Depression    Past Surgical History  Procedure Laterality Date  . Gastric bypass    . Diagnostic laparoscopy  2003    repair of perforation at [facility] anastamosis   . Lumbar laminectomy/decompression microdiscectomy Right 04/14/2014    Procedure: LUMBAR LAMINECTOMY/DECOMPRESSION MICRODISCECTOMY LEVEL L4-5;  Surgeon:  Maeola Harman, MD;  Location: [facility];  Service: Neurosurgery;  Laterality: Right;  LUMBAR LAMINECTOMY/DECOMPRESSION MICRODISCECTOMY LEVEL L4-5  . Laparotomy N/A 08/18/2014    Procedure: EXPLORATORY LAPAROTOMY, Patch graft of gastric perforation;  Surgeon: Abigail Miyamoto, MD;  Location: [facility];  Service: General;  Laterality: N/A;   Family History  Problem Relation Age of Onset  . Hypertension Paternal Grandfather   . Colon cancer Paternal Grandfather 68  . Asthma Paternal Grandmother   . Hypertension Paternal Grandmother   . Breast cancer Paternal Grandmother   . Cancer Paternal Grandmother     lung   . Diabetes Maternal Grandmother   . Diabetes Maternal Grandfather   . Hypertension Father   . Alcohol abuse Father   . Urinary tract infection Mother   . Stroke Mother 56    3 strokes    Social History  Substance Use Topics  . Smoking status: Current Some Day Smoker -- 0.20 packs/day    Types: Cigarettes  . Smokeless tobacco: Never Used  . Alcohol Use: No     Comment: hx drinking 1 pt of vodka and 2 24 oz beers   OB History    Gravida Para Term Preterm AB TAB SAB Ectopic Multiple Living   0         0  Review of Systems  Constitutional: Negative for fatigue.  HENT: Negative for tinnitus.   Eyes: Negative for photophobia, pain and visual disturbance.  Respiratory: Negative for shortness of breath.   Cardiovascular: Positive for chest pain (rib pain).  Gastrointestinal: Negative for nausea and vomiting.  Musculoskeletal: Positive for arthralgias. Negative for back pain, gait problem and neck pain.  Skin: Negative for wound.  Neurological: Negative for dizziness, weakness, light-headedness, numbness and headaches.  Psychiatric/Behavioral: Negative for confusion and decreased concentration.     Allergies  Cymbalta; Dilaudid; Abilify; Meperidine; Naltrexone; and Prednisone  Home Medications   Prior to Admission medications   Medication Sig Start Date End Date  Taking? Authorizing Provider  benztropine (COGENTIN) 0.5 MG tablet Take 1 tablet (0.5 mg total) by mouth at bedtime. 09/12/15 09/11/16  Cleotis Nipper, MD  ferrous sulfate 325 (65 FE) MG tablet Take 325 mg by mouth 2 (two) times daily.    Historical Provider, MD  FLUoxetine (PROZAC) 40 MG capsule Take 1 capsule (40 mg total) by mouth at bedtime. 09/12/15   Cleotis Nipper, MD  lamoTRIgine (LAMICTAL) 200 MG tablet Take 1 tablet (200 mg total) by mouth daily. 09/12/15   Cleotis Nipper, MD  LORazepam (ATIVAN) 0.5 MG tablet Take 1 tablet up to twice daily as needed for anxiety/sleep. 08/30/15   Historical Provider, MD  methocarbamol (ROBAXIN) 500 MG tablet Take 500 mg by mouth 4 (four) times daily as needed for muscle spasms.     Historical Provider, MD  Multiple Vitamin (MULTIVITAMIN WITH MINERALS) TABS tablet Take 1 tablet by mouth daily. 08/16/15   Adonis Brook, NP  Multiple Vitamins-Minerals (HAIR/SKIN/NAILS PO) Take 1 tablet by mouth daily.    Historical Provider, MD  omeprazole (PRILOSEC) 20 MG capsule Take 1 capsule (20 mg total) by mouth daily. For acid reflux 05/30/15   Sanjuana Kava, NP  oxyCODONE-acetaminophen (PERCOCET) 5-325 MG tablet Take 1-2 tablets by mouth every 4 (four) hours as needed. 10/02/15   Gilda Crease, MD  ranitidine (ZANTAC) 150 MG capsule Take 150 mg by mouth daily.    Historical Provider, MD  risperiDONE (RISPERDAL) 1 MG tablet Take 1 tablet (1 mg total) by mouth at bedtime. 09/12/15 09/11/16  Cleotis Nipper, MD  SUMAtriptan (IMITREX) 50 MG tablet Take 1 tablet (50 mg total) by mouth every 2 (two) hours as needed for migraine or headache. 05/30/15   Sanjuana Kava, NP  tiZANidine (ZANAFLEX) 4 MG tablet TAKE ONE TABLET (4 MG TOTAL) BY MOUTH 3 (THREE) TIMES A DAY. 08/30/15   Historical Provider, MD   BP 122/82 mmHg  Pulse 82  Temp(Src) 97.5 F (36.4 C) (Oral)  Resp 18  SpO2 100%  LMP 09/19/2015 (Exact Date)   Physical Exam  Constitutional: She is oriented to person,  place, and time. She appears well-developed and well-nourished. No distress.  HENT:  Head: Normocephalic and atraumatic. Head is without raccoon's eyes and without Battle's sign.  Right Ear: Tympanic membrane, external ear and ear canal normal. No hemotympanum.  Left Ear: Tympanic membrane, external ear and ear canal normal. No hemotympanum.  Nose: Nose normal. No nasal septal hematoma.  Mouth/Throat: Uvula is midline, oropharynx is clear and moist and mucous membranes are normal.  Eyes: Conjunctivae, EOM and lids are normal. Pupils are equal, round, and reactive to light. Right eye exhibits no nystagmus. Left eye exhibits no nystagmus.  No visible hyphema noted  Neck: Normal range of motion. Neck supple. No tracheal deviation present.  Cardiovascular: Normal  rate and regular rhythm.   Pulses:      Dorsalis pedis pulses are 2+ on the right side, and 2+ on the left side.       Posterior tibial pulses are 2+ on the right side, and 2+ on the left side.  Pulmonary/Chest: Effort normal and breath sounds normal. No respiratory distress. She exhibits tenderness (bilateral lateral ribs. No deformity or ecchymosis. ).  Abdominal: Soft. There is no tenderness.  Musculoskeletal: Normal range of motion. She exhibits edema and tenderness.       Cervical back: She exhibits normal range of motion, no tenderness and no bony tenderness.       Thoracic back: She exhibits no tenderness and no bony tenderness.       Lumbar back: She exhibits no tenderness and no bony tenderness.  Patient complains of pain with palpation of the lateral left ankle. She denies pain with palpation over the fibular head of the affected side. She denies pain in the hip of the affected side.  Patient with pain and swelling of right 2nd toe.   Neurological: She is alert and oriented to person, place, and time. She has normal strength and normal reflexes. No cranial nerve deficit or sensory deficit. Coordination normal. GCS eye subscore  is 4. GCS verbal subscore is 5. GCS motor subscore is 6.  Distal motor, sensation, and vascular intact.   Skin: Skin is warm and dry.  Psychiatric: She has a normal mood and affect. Her behavior is normal.  Nursing note and vitals reviewed.   ED Course  Procedures (including critical care time) DIAGNOSTIC STUDIES: Oxygen Saturation is 100% on RA, normal by my interpretation.    COORDINATION OF CARE: 3:51 PM-Discussed treatment plan with pt at bedside and pt agreed to plan.     Labs Review Labs Reviewed - No data to display  Imaging Review Dg Chest 2 View  10/27/2015  CLINICAL DATA:  Initial encounter for fall while getting out of a truck 1 week ago with persistent bilateral chest pain under both breasts. EXAM: CHEST  2 VIEW COMPARISON:  10/02/2015. FINDINGS: Low lung volumes. The lungs are clear wiithout focal pneumonia, edema, pneumothorax or pleural effusion. The cardiopericardial silhouette is within normal limits for size. Imaged bony structures of the thorax are intact. IMPRESSION: No active cardiopulmonary disease. Electronically Signed   By: Kennith Center M.D.   On: 10/27/2015 16:46   Dg Ankle Complete Left  10/27/2015  CLINICAL DATA:  Lateral left ankle pain following a fall getting out of a truck 1 week ago. EXAM: LEFT ANKLE COMPLETE - 3+ VIEW COMPARISON:  08/24/2015. FINDINGS: Diffuse lateral soft tissue swelling. Small effusion. No fracture or dislocation seen. Mild inferior calcaneal spur formation. IMPRESSION: Lateral soft tissue swelling and small effusion.  No fracture. Electronically Signed   By: Beckie Salts M.D.   On: 10/27/2015 16:44   Dg Toe 2nd Right  10/27/2015  CLINICAL DATA:  Pain following fall EXAM: RIGHT SECOND TOE--3V COMPARISON:  None. FINDINGS: Frontal, oblique, and lateral views were obtained. There is a fracture along the volar aspect of the proximal portion of the second middle phalanx. The avulsed fragment is mildly displaced. No other fracture. No  dislocation. Joint spaces appear intact. No erosive change. IMPRESSION: Avulsion type fracture along the volar aspect of the proximal portion of the second middle phalanx. No other fracture. No dislocation. No appreciable arthropathy. Electronically Signed   By: Bretta Bang III M.D.   On: 10/27/2015 16:45  EKG Interpretation None       Patient updated on results. Will treat with ASO for ankle sprain, buddy tape and postop shoe for toe fracture. Encouraged NSAIDs. Patient was counseled on RICE protocol and told to rest injury, use ice for no longer than 15 minutes every hour, compress the area, and elevate above the level of their heart as much as possible to reduce swelling. Questions answered. Patient verbalized understanding.   Patient counseled on use of narcotic pain medications. Counseled not to combine these medications with others containing tylenol. Urged not to drink alcohol, drive, or perform any other activities that requires focus while taking these medications. The patient verbalizes understanding and agrees with the plan.   MDM   Final diagnoses:  Ankle sprain, left, initial encounter  Toe fracture, right, closed, initial encounter  Chest wall pain   Patient with injuries after fall. She has a history of MS which makes her legs weak. Injuries as above. Extremities are neurovascularly intact. No respiratory distress or difficulty breathing. Conservative measures indicated for symptomatic relief. Encouraged PCP follow-up for recheck of her ankle in 1 week if not improved.  I personally performed the services described in this documentation, which was scribed in my presence. The recorded information has been reviewed and is accurate.    Renne Crigler, PA-C 10/27/15 1711  Derwood Kaplan, MD 10/27/15 2306

## 2015-10-27 NOTE — ED Notes (Signed)
Pt c/o right toe, left ankle, bilateral rib pain onset Thursday after fall onto concrete. No anticoagulants.

## 2015-10-27 NOTE — Discharge Instructions (Signed)
Please read and follow all provided instructions.  Your diagnoses today include:  1. Ankle sprain, left, initial encounter   2. Toe fracture, right, closed, initial encounter   3. Chest wall pain     Tests performed today include:  An x-ray of your ankle - does NOT show any broken bones  An x-ray of your toe - shows small fracture  An x-ray of your chest - normal  Vital signs. See below for your results today.   Medications prescribed:   Vicodin (hydrocodone/acetaminophen) - narcotic pain medication  DO NOT drive or perform any activities that require you to be awake and alert because this medicine can make you drowsy. BE VERY CAREFUL not to take multiple medicines containing Tylenol (also called acetaminophen). Doing so can lead to an overdose which can damage your liver and cause liver failure and possibly death.   Naproxen - anti-inflammatory pain medication  Do not exceed 500mg  naproxen every 12 hours, take with food  You have been prescribed an anti-inflammatory medication or NSAID. Take with food. Take smallest effective dose for the shortest duration needed for your pain. Stop taking if you experience stomach pain or vomiting.   Take any prescribed medications only as directed.  Home care instructions:   Follow any educational materials contained in this packet  Follow R.I.C.E. Protocol:  R - rest your injury   I  - use ice on injury without applying directly to skin  C - compress injury with bandage or splint  E - elevate the injury as much as possible  Follow-up instructions: Please follow-up with your primary care provider or the provided orthopedic (bone specialist) if you continue to have significant pain or trouble walking in 1 week. In this case you may have a severe sprain that requires further care.   Return instructions:   Please return if your toes are numb or tingling, appear gray or blue, or you have severe pain (also elevate leg and loosen  splint or wrap)  Please return to the Emergency Department if you experience worsening symptoms.   Please return if you have any other emergent concerns.  Additional Information:  Your vital signs today were: BP 122/82 mmHg   Pulse 82   Temp(Src) 97.5 F (36.4 C) (Oral)   Resp 18   SpO2 100%   LMP 09/19/2015 (Exact Date) If your blood pressure (BP) was elevated above 135/85 this visit, please have this repeated by your doctor within one month. -------------- Your caregiver has diagnosed you as suffering from an ankle sprain. Ankle sprain occurs when the ligaments that hold the ankle joint together are stretched or torn. It may take 4 to 6 weeks to heal.

## 2015-11-14 ENCOUNTER — Ambulatory Visit (HOSPITAL_COMMUNITY): Payer: Self-pay | Admitting: Psychiatry

## 2015-12-29 ENCOUNTER — Encounter (HOSPITAL_COMMUNITY): Payer: Self-pay | Admitting: Psychology

## 2015-12-29 NOTE — Progress Notes (Signed)
Beth [name] is a 35 y.o. female patient discharged from counseling as informed front office 09/16/15 that has found other providers in the community and not returning.  Outpatient Therapist Discharge Summary  Beth [name]    [birth year]   Admission Date: 07/11/15   Discharge Date:  12/29/15 Reason for Discharge:  Pt transferred care to other providers in the community.  Diagnosis:  MDD, PTSD  Comments:  Pt informed front office not returning for services with [facility] outpt.  Alfredo Batty, LPC

## 2016-03-28 ENCOUNTER — Other Ambulatory Visit: Payer: Self-pay | Admitting: Neurosurgery

## 2016-03-28 DIAGNOSIS — M5416 Radiculopathy, lumbar region: Secondary | ICD-10-CM

## 2016-04-05 ENCOUNTER — Ambulatory Visit
Admission: RE | Admit: 2016-04-05 | Discharge: 2016-04-05 | Disposition: A | Discharge status: Home / Self Care | Payer: BLUE CROSS/BLUE SHIELD | Source: Ambulatory Visit | Attending: Neurosurgery | Admitting: Neurosurgery

## 2016-04-05 DIAGNOSIS — M5416 Radiculopathy, lumbar region: Secondary | ICD-10-CM

## 2016-04-05 MED ORDER — GADOBENATE DIMEGLUMINE 529 MG/ML IV SOLN
20.0000 mL | Freq: Once | INTRAVENOUS | Status: AC | PRN
  Administered 2016-04-05: 20 mL via INTRAVENOUS

## 2016-09-03 ENCOUNTER — Other Ambulatory Visit: Payer: Self-pay | Admitting: Physician Assistant

## 2016-09-03 DIAGNOSIS — Z1231 Encounter for screening mammogram for malignant neoplasm of breast: Secondary | ICD-10-CM

## 2016-09-16 IMAGING — CT CT ABD-PELV W/ CM
2 of 4 series · 15 of 46 positions shown, 17 images · IV contrast (Omni 300)
Comparison: 08/18/2014

CLINICAL DATA: Left upper abdominal pain inadvertent NG tube
removal. NG tube recently replaced. History of perforated marginal
gastric ulcer, status post exploratory laparotomy and repair.

EXAM:
CT ABDOMEN AND PELVIS WITH CONTRAST
TECHNIQUE: Multidetector CT imaging of the abdomen and pelvis was performed
using the standard protocol following bolus administration of
intravenous contrast.
CONTRAST:  100mL OMNIPAQUE IOHEXOL 300 MG/ML  SOLN

[Series 2: abd/ pelvis 5.0 i30f 1 · axial · 0.98mm/px · z∈[+761,+1226]mm · 12 of 111 slices shown, 14 images]
[im 9/111  soft-tissue]
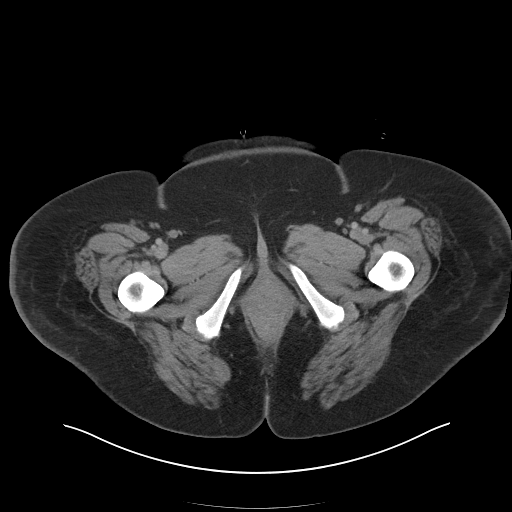
[im 9/111  bone]
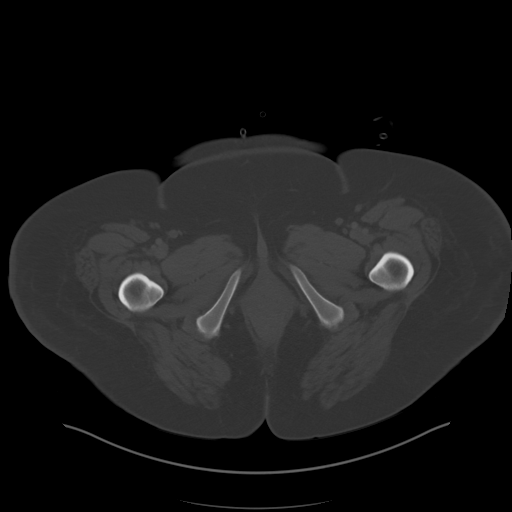
[im 17/111  soft-tissue]
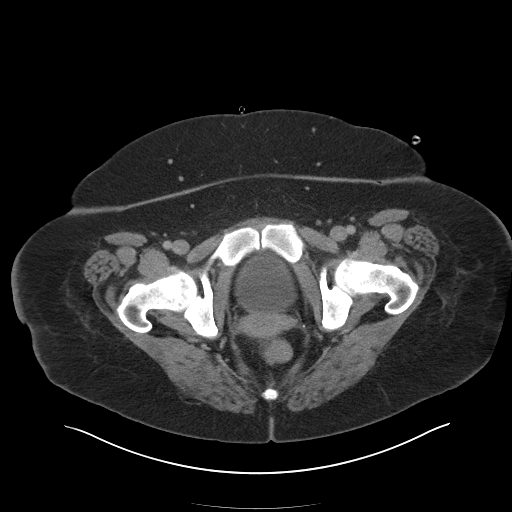
[im 25/111  soft-tissue]
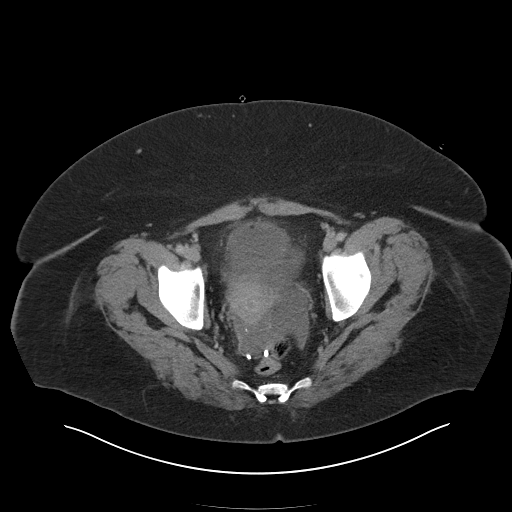
[im 33/111  soft-tissue]
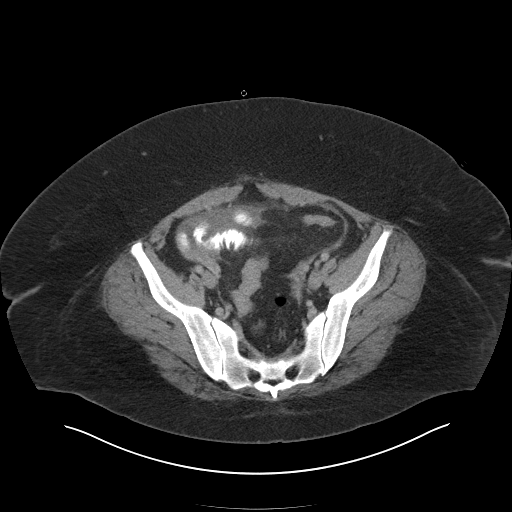
[im 41/111  soft-tissue]
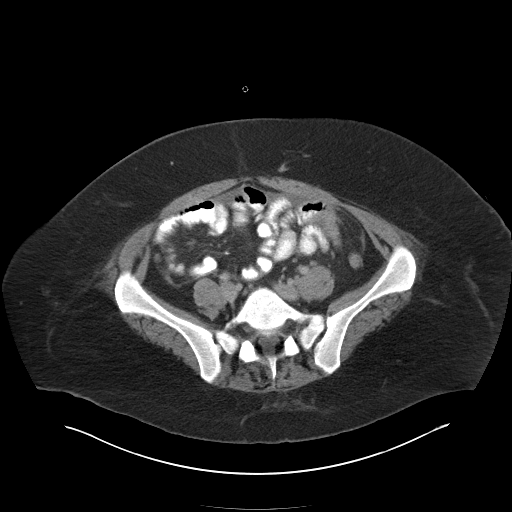
[im 49/111  soft-tissue]
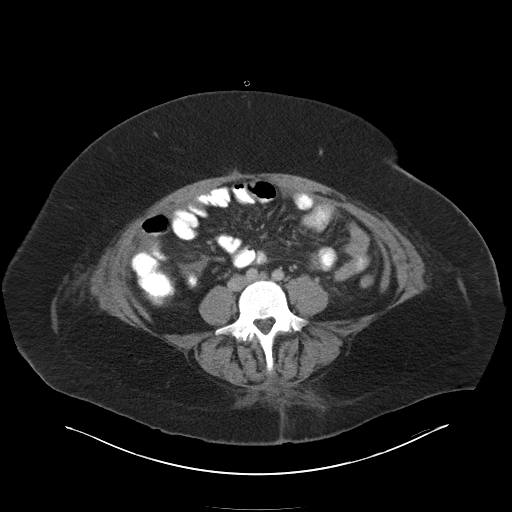
[im 62/111  soft-tissue]
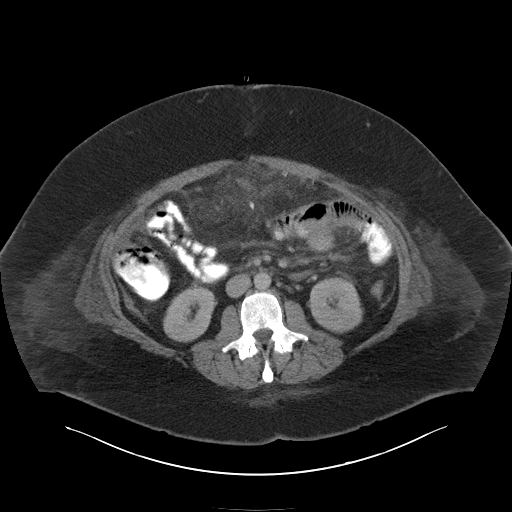
[im 70/111  soft-tissue]
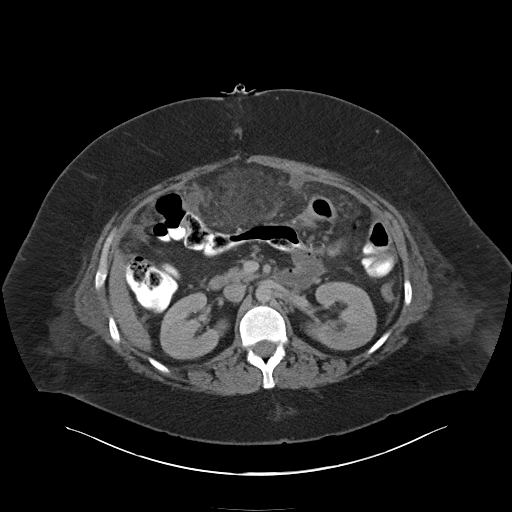
[im 78/111  soft-tissue]
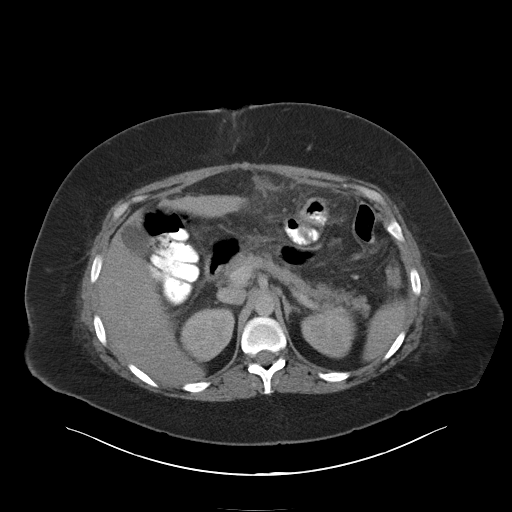
[im 78/111  bone]
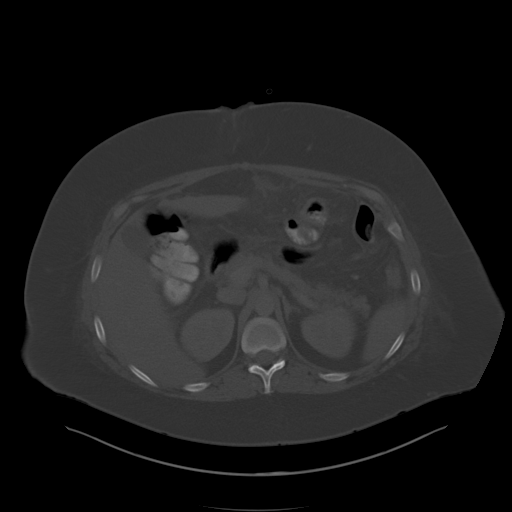
[im 86/111  soft-tissue]
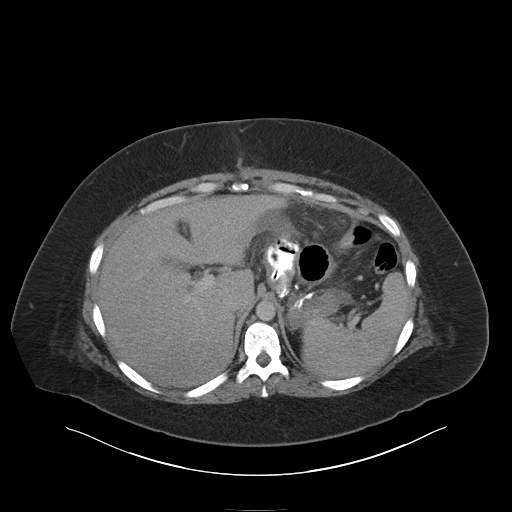
[im 94/111  soft-tissue]
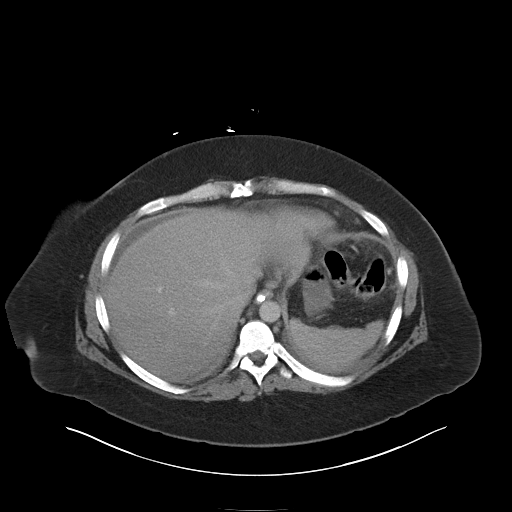
[im 102/111  soft-tissue]
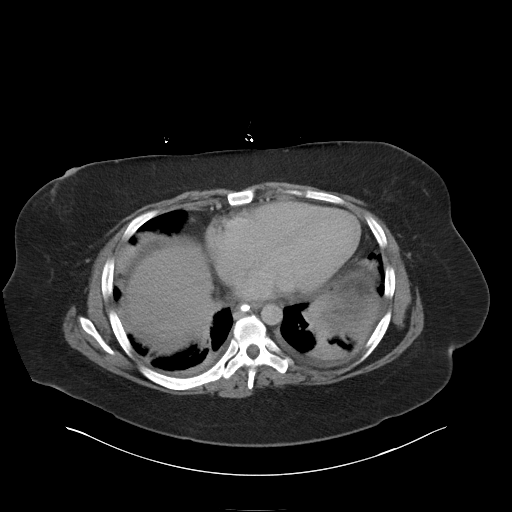

[Series 5: coronals · coronal · 0.79mm/px · 3 of 128 slices shown]
[im 43/128  soft-tissue]
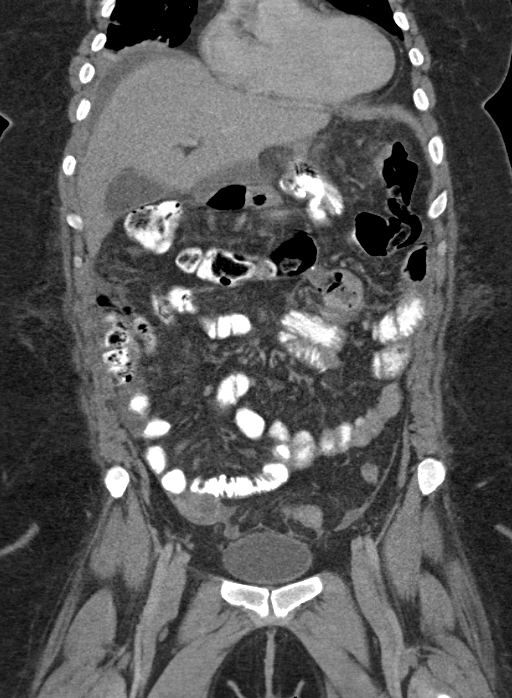
[im 57/128  soft-tissue]
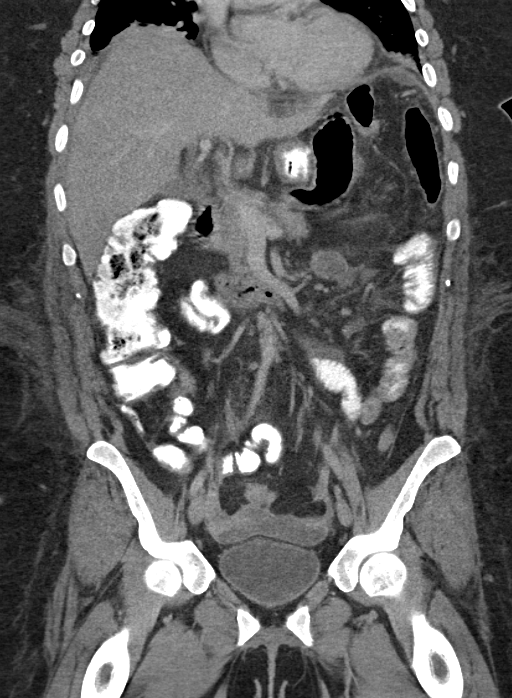
[im 71/128  soft-tissue]
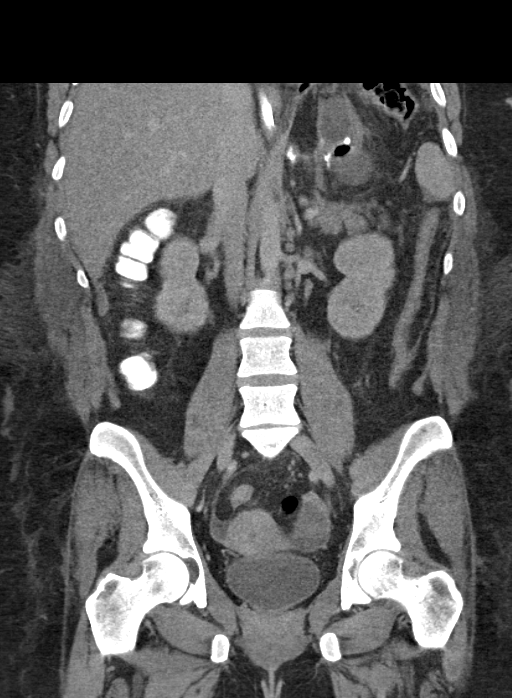

[15 of 46 positions shown; findings below may reference images not displayed]

FINDINGS: Lower chest: Increased areas of bibasilar atelectasis/
consolidation. Trace pleural effusions dependently. Normal heart
size. No pericardial effusion. No pneumothorax.

Abdomen: NG tube enters the small stomach. Patient is status post
previous gastric bypass surgery. Postop changes of the anterior
abdominal wall from laparotomy. Small amount of scattered
perihepatic ascites. Small amount of fluid noted along the left
hepatic lobe and adjacent duodenum. Strandy edema and fluid
throughout the anterior mesenteric and omentum. No significant
pneumoperitoneum when compared to the prior study. Small amount of
entrapped gas within the omentum, image 39. No contrast
extravasation. Negative for bowel obstruction. No evidence of
recurrent perforation.

Liver, gallbladder, biliary system, pancreas, spleen, adrenal
glands, kidneys are within normal limits for age and demonstrate no
acute process.

Pelvis: Small amount of scattered pelvic free fluid/ascites. This is
likely postoperative. Urinary bladder demonstrates a small amount of
air anteriorly, suspect related to instrumentation/Foley catheter
previously. Uterus and adnexa unremarkable. No adenopathy, definite
abscess, inguinal abnormality, or hernia.

No acute osseous finding.
IMPRESSION: Replacement of NG tube within the small residual stomach. Status
post gastric bypass surgery and recent repair of a perforated
marginal ulcer.

No evidence of recurrent perforation, significant free air, or
contrast extravasation.

Increased bibasilar areas of atelectasis/consolidation and trace
pleural effusions.

Scattered upper abdominal free fluid about the liver and within the
anterior mesentery/ omentum, suspect postoperative.

Small amount of lower abdominal and pelvic ascites as well.

Negative for bowel obstruction or definite abscess

## 2016-09-18 IMAGING — CR DG CHEST 1V PORT
1 series · 1 of 1 positions shown · non-contrast
Comparison: Portable exam 8834 hr without priors for comparison.

CLINICAL DATA: Leukocytosis, dyspnea, post abdominal surgery

EXAM:
PORTABLE CHEST - 1 VIEW

[AP]
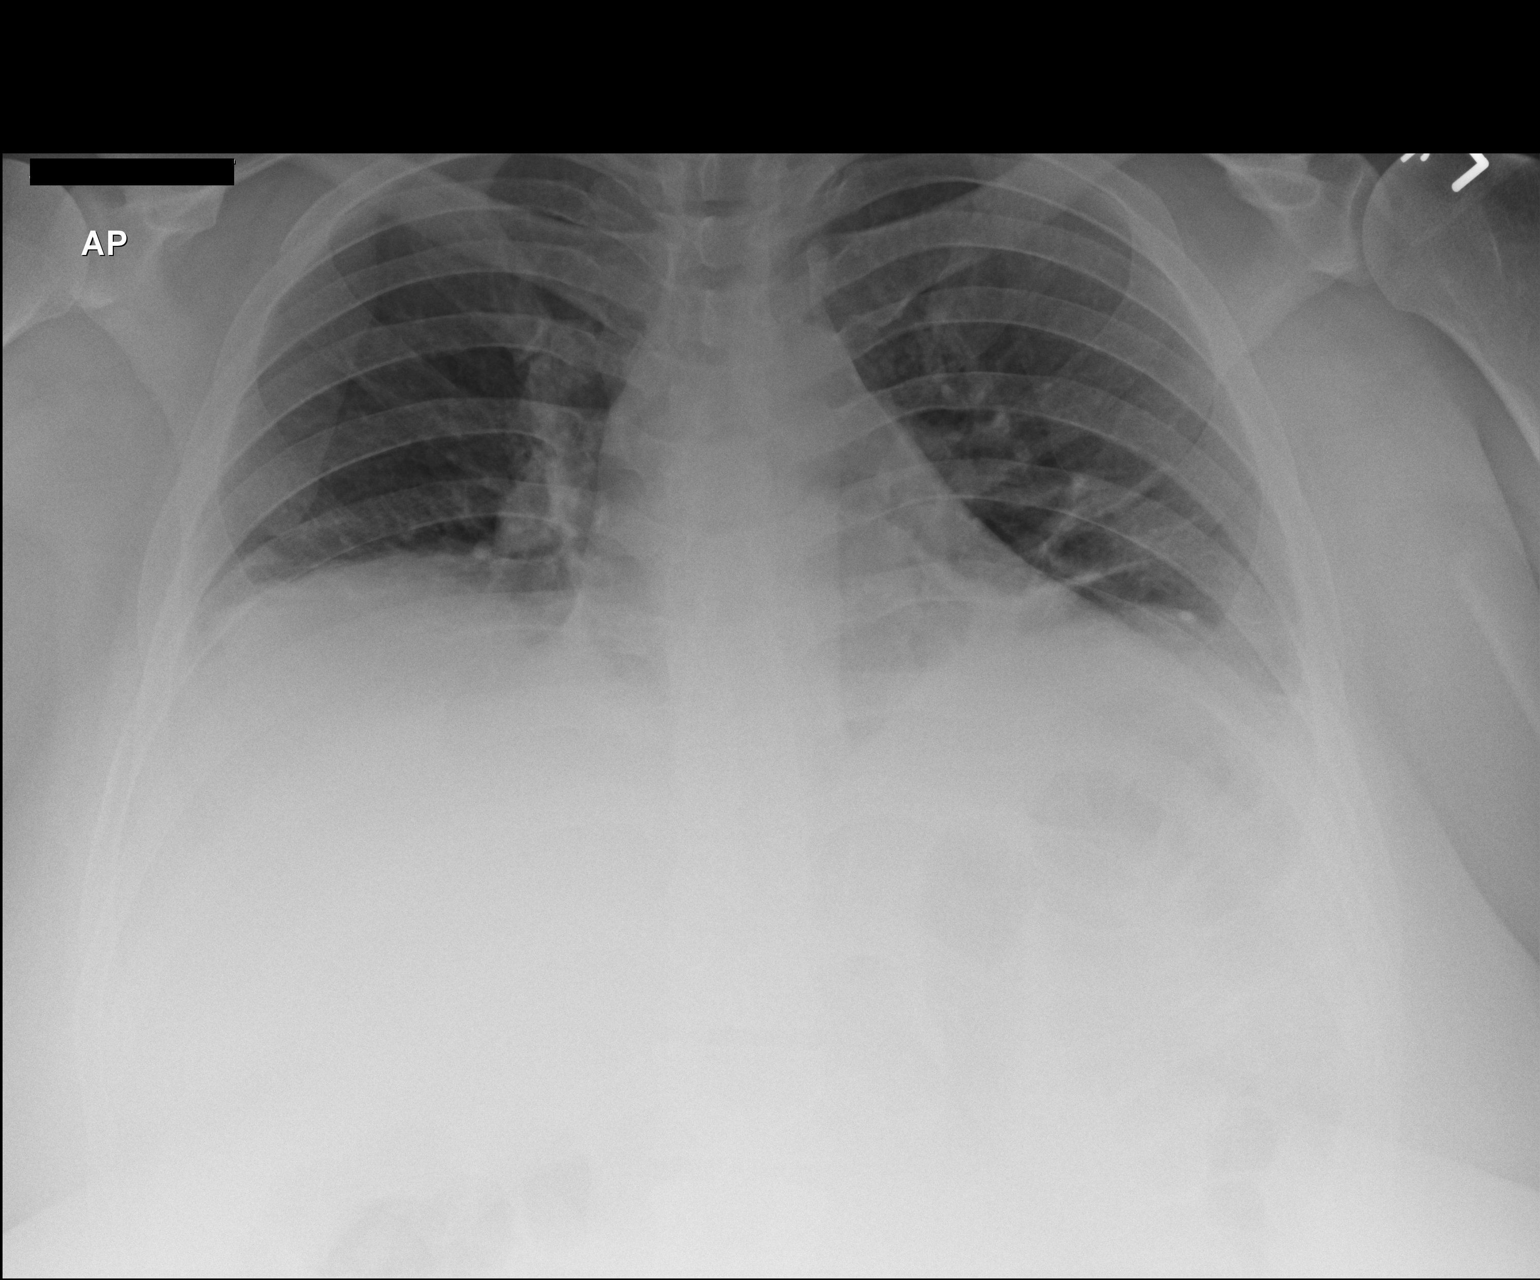

[1 of 1 positions shown; findings below may reference images not displayed]

FINDINGS: Mildly prominent size of cardiac silhouette accentuated by
hypoinflation.

Mediastinal contours and pulmonary vascularity normal.

Decreased lung volumes with bibasilar atelectasis.

Upper lungs clear.

No pleural effusion or pneumothorax.
IMPRESSION: Decreased lung volumes with bibasilar atelectasis.

## 2016-10-05 ENCOUNTER — Ambulatory Visit
Admission: RE | Admit: 2016-10-05 | Discharge: 2016-10-05 | Disposition: A | Discharge status: Home / Self Care | Payer: BLUE CROSS/BLUE SHIELD | Source: Ambulatory Visit | Attending: Physician Assistant | Admitting: Physician Assistant

## 2016-10-05 DIAGNOSIS — Z1231 Encounter for screening mammogram for malignant neoplasm of breast: Secondary | ICD-10-CM

## 2016-10-09 ENCOUNTER — Other Ambulatory Visit: Payer: Self-pay | Admitting: Physician Assistant

## 2016-10-09 DIAGNOSIS — R928 Other abnormal and inconclusive findings on diagnostic imaging of breast: Secondary | ICD-10-CM

## 2016-10-16 ENCOUNTER — Ambulatory Visit
Admission: RE | Admit: 2016-10-16 | Discharge: 2016-10-16 | Disposition: A | Discharge status: Home / Self Care | Payer: BLUE CROSS/BLUE SHIELD | Source: Ambulatory Visit | Attending: Physician Assistant | Admitting: Physician Assistant

## 2016-10-16 DIAGNOSIS — R928 Other abnormal and inconclusive findings on diagnostic imaging of breast: Secondary | ICD-10-CM

## 2017-08-15 ENCOUNTER — Emergency Department (HOSPITAL_COMMUNITY)
Admission: EM | Admit: 2017-08-15 | Discharge: 2017-08-15 | Disposition: A | Discharge status: Home / Self Care | Payer: BLUE CROSS/BLUE SHIELD | Attending: Emergency Medicine | Admitting: Emergency Medicine

## 2017-08-15 ENCOUNTER — Encounter (HOSPITAL_COMMUNITY): Payer: Self-pay

## 2017-08-15 DIAGNOSIS — J45909 Unspecified asthma, uncomplicated: Secondary | ICD-10-CM | POA: Diagnosis not present

## 2017-08-15 DIAGNOSIS — F1410 Cocaine abuse, uncomplicated: Secondary | ICD-10-CM

## 2017-08-15 DIAGNOSIS — F1110 Opioid abuse, uncomplicated: Secondary | ICD-10-CM | POA: Diagnosis not present

## 2017-08-15 DIAGNOSIS — F17210 Nicotine dependence, cigarettes, uncomplicated: Secondary | ICD-10-CM | POA: Diagnosis not present

## 2017-08-15 DIAGNOSIS — F1193 Opioid use, unspecified with withdrawal: Secondary | ICD-10-CM | POA: Diagnosis present

## 2017-08-15 DIAGNOSIS — I951 Orthostatic hypotension: Secondary | ICD-10-CM

## 2017-08-15 DIAGNOSIS — E860 Dehydration: Secondary | ICD-10-CM | POA: Diagnosis not present

## 2017-08-15 DIAGNOSIS — Z79899 Other long term (current) drug therapy: Secondary | ICD-10-CM | POA: Diagnosis not present

## 2017-08-15 DIAGNOSIS — F1510 Other stimulant abuse, uncomplicated: Secondary | ICD-10-CM

## 2017-08-15 HISTORY — DX: Other psychoactive substance abuse, uncomplicated: F19.10

## 2017-08-15 LAB — RAPID URINE DRUG SCREEN, HOSP PERFORMED
Amphetamines: POSITIVE — AB
BARBITURATES: NOT DETECTED
Benzodiazepines: NOT DETECTED
Cocaine: POSITIVE — AB
Opiates: POSITIVE — AB
Tetrahydrocannabinol: NOT DETECTED

## 2017-08-15 LAB — URINALYSIS, ROUTINE W REFLEX MICROSCOPIC
BILIRUBIN URINE: NEGATIVE
GLUCOSE, UA: NEGATIVE mg/dL
HGB URINE DIPSTICK: NEGATIVE
KETONES UR: NEGATIVE mg/dL
Leukocytes, UA: NEGATIVE
NITRITE: NEGATIVE
PH: 7 (ref 5.0–8.0)
Protein, ur: NEGATIVE mg/dL
Specific Gravity, Urine: 1.01 (ref 1.005–1.030)

## 2017-08-15 LAB — BASIC METABOLIC PANEL
ANION GAP: 8 (ref 5–15)
BUN: 7 mg/dL (ref 6–20)
CO2: 25 mmol/L (ref 22–32)
Calcium: 8.7 mg/dL — ABNORMAL LOW (ref 8.9–10.3)
Chloride: 108 mmol/L (ref 101–111)
Creatinine, Ser: 0.69 mg/dL (ref 0.44–1.00)
GFR calc Af Amer: >60 mL/min (ref >60)
Glucose, Bld: 89 mg/dL (ref 65–99)
POTASSIUM: 3.6 mmol/L (ref 3.5–5.1)
SODIUM: 141 mmol/L (ref 135–145)

## 2017-08-15 LAB — CBC WITH DIFFERENTIAL/PLATELET
BASOS PCT: 0 %
Basophils Absolute: 0 10*3/uL (ref 0.0–0.1)
Eosinophils Absolute: 0.1 10*3/uL (ref 0.0–0.7)
Eosinophils Relative: 1 %
HEMATOCRIT: 39.8 % (ref 36.0–46.0)
Hemoglobin: 13.2 g/dL (ref 12.0–15.0)
LYMPHS PCT: 21 %
Lymphs Abs: 1.4 10*3/uL (ref 0.7–4.0)
MCH: 27.5 pg (ref 26.0–34.0)
MCHC: 33.2 g/dL (ref 30.0–36.0)
MCV: 82.9 fL (ref 78.0–100.0)
Monocytes Absolute: 0.6 10*3/uL (ref 0.1–1.0)
Monocytes Relative: 9 %
Neutro Abs: 4.6 10*3/uL (ref 1.7–7.7)
Neutrophils Relative %: 69 %
Platelets: 347 10*3/uL (ref 150–400)
RBC: 4.8 MIL/uL (ref 3.87–5.11)
RDW: 13.3 % (ref 11.5–15.5)
WBC: 6.6 10*3/uL (ref 4.0–10.5)

## 2017-08-15 LAB — CBG MONITORING, ED: GLUCOSE-CAPILLARY: 100 mg/dL — AB (ref 65–99)

## 2017-08-15 LAB — PREGNANCY, URINE: PREG TEST UR: NEGATIVE

## 2017-08-15 MED ORDER — ONDANSETRON HCL 4 MG/2ML IJ SOLN
4.0000 mg | Freq: Once | INTRAMUSCULAR | Status: AC
  Administered 2017-08-15: 4 mg via INTRAVENOUS
  Filled 2017-08-15: qty 2

## 2017-08-15 MED ORDER — CYCLOBENZAPRINE HCL 10 MG PO TABS
10.0000 mg | ORAL_TABLET | Freq: Once | ORAL | Status: AC
  Administered 2017-08-15: 10 mg via ORAL
  Filled 2017-08-15: qty 1

## 2017-08-15 MED ORDER — FAMOTIDINE 20 MG PO TABS: 20.0000 mg | ORAL_TABLET | Freq: Two times a day (BID) | ORAL | 0 refills | Status: DC

## 2017-08-15 MED ORDER — FAMOTIDINE IN NACL 20-0.9 MG/50ML-% IV SOLN
20.0000 mg | Freq: Once | INTRAVENOUS | Status: AC
  Administered 2017-08-15: 20 mg via INTRAVENOUS
  Filled 2017-08-15: qty 50

## 2017-08-15 MED ORDER — SODIUM CHLORIDE 0.9 % IV BOLUS (SEPSIS)
1000.0000 mL | Freq: Once | INTRAVENOUS | Status: AC
  Administered 2017-08-15: 1000 mL via INTRAVENOUS

## 2017-08-15 MED ORDER — ONDANSETRON 4 MG PO TBDP: 4.0000 mg | ORAL_TABLET | ORAL | 0 refills | Status: DC | PRN

## 2017-08-15 NOTE — ED Triage Notes (Addendum)
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 Last used all substances yesterday. EMS glucometer read low . Patient was given oral glucose and CBG increased to 97.

## 2017-08-15 NOTE — ED Provider Notes (Signed)
[facility]-EMERGENCY DEPT Provider Note   CSN: [id number] Arrival date & time: 08/15/17  1543     History   Chief Complaint Chief Complaint  Patient presents with  . Withdrawal  . Hypoglycemia    HPI Beth [name] is a 36 y.o. female.  HPI Patient has been a chronic heroin abuser. She reports last use her yesterday. She reports she is having withdrawal symptoms and feels like she is crawling out of her skin. Nausea no active vomiting. Patient also uses methamphetamine intermittently. Last use about 2 days ago. Denies any benzodiazepine use. Has not used alcohol in greater than a year. Rare cocaine use (some lady gave her some the other day).  Patient reports that she wants to get detox and get a program. Past Medical History:  Diagnosis Date  . Acid reflux   . Anxiety   . Asthma   . Chronic diarrhea    self reported  . Chronic nausea    self reported  . Chronic pain    self reported chronic pain, back pain, fibromyalgia  . Delirium tremens (HCC)   . Depression   . ETOH abuse   . Gastric ulcer   . History of blood transfusion   . Insomnia   . Migraine   . Multiple sclerosis (HCC) 2005   [place] in [place], failed steroids, no other medication  . Polysubstance abuse (HCC)   . Substance abuse [facility](HCC)     Patient Active Problem List   Diagnosis Date Noted  . PTSD (post-traumatic stress disorder) 07/11/2015  . Panic disorder without agoraphobia with severe panic attacks 05/26/2015  . Major depressive disorder, recurrent episode (HCC) 05/23/2015  . Suicidal ideations   . Major depressive disorder, recurrent, severe without psychotic features (HCC)   . Polysubstance dependence (HCC)   . Substance induced mood disorder (HCC)   . Substance abuse (HCC) 03/11/2015  . 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 Alcohol use disorder, moderate, dependence (HCC) 01/13/2015  .  Free intraperitoneal air 08/18/2014  . Perforated ulcer (HCC) 08/18/2014  . Herniated lumbar disc without myelopathy 04/13/2014  . Acid reflux   . History of blood transfusion   . Multiple sclerosis (HCC) 03/13/2012  . H/O gastric bypass 03/13/2012    Past Surgical History:  Procedure Laterality Date  . DIAGNOSTIC LAPAROSCOPY  2003   repair of perforation at [facility] anastamosis   . GASTRIC BYPASS    . LAPAROTOMY N/A 08/18/2014   Procedure: EXPLORATORY LAPAROTOMY, Patch graft of gastric perforation;  Surgeon: Abigail Miyamotoouglas Blackman, MD;  Location: [facility] OR;  Service: General;  Laterality: N/A;  . LUMBAR LAMINECTOMY/DECOMPRESSION MICRODISCECTOMY Right 04/14/2014   Procedure: LUMBAR LAMINECTOMY/DECOMPRESSION MICRODISCECTOMY LEVEL L4-5;  Surgeon: Maeola HarmanJoseph Stern, MD;  Location: [facility] NEURO ORS;  Service: Neurosurgery;  Laterality: Right;  LUMBAR LAMINECTOMY/DECOMPRESSION MICRODISCECTOMY LEVEL L4-5    OB History    Gravida Para Term Preterm AB Living   0         0   SAB TAB Ectopic Multiple Live Births                   Home Medications    Prior to Admission medications   Medication Sig Start Date End Date Taking? Authorizing Provider  benztropine (COGENTIN) 0.5 MG tablet Take 1 tablet (0.5 mg total) by mouth at bedtime. 09/12/15 08/15/17 Yes Arfeen, Phillips GroutSyed T, MD  Cholecalciferol (VITAMIN D) 2000 units CAPS Take 2,000 Units by  mouth daily.   Yes [provider]  cyclobenzaprine (FLEXERIL) 10 MG tablet Take 10-20 mg by mouth 3 (three) times daily. As needed for muscle spasms 08/03/17  Yes [provider]  Dimethyl Fumarate 240 MG CPDR Take 240 mg by mouth. Take 1 capsule by mouth 2 times daily. Start this dose after completing 120 mg capsules 09/18/16 09/18/17 Yes [provider]  lamoTRIgine (LAMICTAL) 150 MG tablet Take 150 mg by mouth 2 (two) times daily. 150 mg in the morning and 150 mg in the evening 03/29/17  Yes [provider]  Multiple Vitamin (MULTIVITAMIN WITH  MINERALS) TABS tablet Take 1 tablet by mouth daily. 08/16/15  Yes Adonis Brook, NP  Multiple Vitamins-Minerals (HAIR/SKIN/NAILS PO) Take 1 tablet by mouth daily.   Yes [provider]  risperiDONE (RISPERDAL) 0.5 MG tablet Take 1 mg by mouth at bedtime. 05/20/17  Yes [provider]  SUMAtriptan (IMITREX) 50 MG tablet Take 1 tablet (50 mg total) by mouth every 2 (two) hours as needed for migraine or headache. 05/30/15  Yes Armandina Stammer I, NP  tiZANidine (ZANAFLEX) 4 MG tablet TAKE ONE TABLET (4 MG TOTAL) BY MOUTH 3 (THREE) TIMES A DAY. 08/30/15  Yes [provider]  topiramate (TOPAMAX) 100 MG tablet Take 100 mg by mouth 2 (two) times daily. 100 mg in the morning and 100 mg in the evening   Yes [provider]  valACYclovir (VALTREX) 500 MG tablet Take 500 mg by mouth daily. 06/28/17  Yes [provider]  famotidine (PEPCID) 20 MG tablet Take 1 tablet (20 mg total) by mouth 2 (two) times daily. 08/15/17   Arby Barrette, MD  FLUoxetine (PROZAC) 40 MG capsule Take 1 capsule (40 mg total) by mouth at bedtime. Patient not taking: Reported on 08/15/2017 09/12/15   Cleotis Nipper, MD  HYDROcodone-acetaminophen (NORCO/VICODIN) 5-325 MG tablet Take 1-2 tablets every 6 hours as needed for severe pain Patient not taking: Reported on 08/15/2017 10/27/15   Renne Crigler, PA-C  naproxen (NAPROSYN) 500 MG tablet Take 1 tablet (500 mg total) by mouth 2 (two) times daily. Patient not taking: Reported on 08/15/2017 10/27/15   Renne Crigler, PA-C  omeprazole (PRILOSEC) 20 MG capsule Take 1 capsule (20 mg total) by mouth daily. For acid reflux Patient not taking: Reported on 08/15/2017 05/30/15   Armandina Stammer I, NP  ondansetron (ZOFRAN ODT) 4 MG disintegrating tablet Take 1 tablet (4 mg total) by mouth every 4 (four) hours as needed for nausea or vomiting. 08/15/17   Arby Barrette, MD  risperiDONE (RISPERDAL) 1 MG tablet Take 1 tablet (1 mg total) by mouth at bedtime.  09/12/15 09/11/16  Cleotis Nipper, MD    Family History Family History  Problem Relation Age of Onset  . Hypertension Paternal Grandfather   . Colon cancer Paternal Grandfather 58  . Asthma Paternal Grandmother   . Hypertension Paternal Grandmother   . Breast cancer Paternal Grandmother   . Cancer Paternal Grandmother        lung   . Diabetes Maternal Grandmother   . Diabetes Maternal Grandfather   . Hypertension Father   . Alcohol abuse Father   . Urinary tract infection Mother   . Stroke Mother 19       3 strokes     Social History Social History  Substance Use Topics  . Smoking status: Current Some Day Smoker    Packs/day: 0.20    Types: Cigarettes  . Smokeless tobacco: Never Used  .  Alcohol use No     Comment: patient states she does not drink any more.     Allergies   Cymbalta [duloxetine hcl]; Dilaudid [hydromorphone hcl]; Naltrexone; Nsaids; Zolpidem; Abilify [aripiprazole]; Meperidine; and Prednisone   Review of Systems Review of Systems 10 Systems reviewed and are negative for acute change except as noted in the HPI.   Physical Exam Updated Vital Signs BP (!) 142/90 (BP Location: Right Arm)   Pulse 62   Temp 98.6 F (37 C) (Oral)   Resp 20   Ht 5\' 6"  (1.676 m)   Wt 64.4 kg (142 lb)   LMP 07/16/2017 (Approximate)   SpO2 100%   BMI 22.92 kg/m   Physical Exam  Constitutional: She appears well-developed and well-nourished. No distress.  Patient still appears to be on drugs. She is oriented and speech is cognitively normal. She reports discomfort and agitation feeling but does not have tremor, seems slightly more to the sedated side.  HENT:  Head: Normocephalic and atraumatic.  Nose: Nose normal.  Mouth/Throat: Oropharynx is clear and moist.  Eyes: Pupils are equal, round, and reactive to light. Conjunctivae and EOM are normal.  Neck: Neck supple.  Cardiovascular: Normal rate and regular rhythm.   No murmur heard. Pulmonary/Chest: Effort normal  and breath sounds normal. No respiratory distress.  Abdominal: Soft. There is no tenderness.  Musculoskeletal: She exhibits no edema, tenderness or deformity.  Multiple track marks upper extremities particularly left upper extremity. No areas of soft cellulitis or abscess. Lower extremities no edema no wounds  Neurological: She is alert.  Skin: Skin is warm and dry.  Psychiatric: She has a normal mood and affect.  Nursing note and vitals reviewed.    ED Treatments / Results  Labs (all labs ordered are listed, but only abnormal results are displayed) Labs Reviewed  BASIC METABOLIC PANEL - Abnormal; Notable for the following:       Result Value   Calcium 8.7 (*)    All other components within normal limits  URINALYSIS, ROUTINE W REFLEX MICROSCOPIC - Abnormal; Notable for the following:    APPearance HAZY (*)    All other components within normal limits  RAPID URINE DRUG SCREEN, HOSP PERFORMED - Abnormal; Notable for the following:    Opiates POSITIVE (*)    Cocaine POSITIVE (*)    Amphetamines POSITIVE (*)    All other components within normal limits  CBG MONITORING, ED - Abnormal; Notable for the following:    Glucose-Capillary 100 (*)    All other components within normal limits  CBC WITH DIFFERENTIAL/PLATELET  PREGNANCY, URINE    EKG  EKG Interpretation None       Radiology No results found.  Procedures Procedures (including critical care time)  Medications Ordered in ED Medications  cyclobenzaprine (FLEXERIL) tablet 10 mg (not administered)  ondansetron (ZOFRAN) injection 4 mg (4 mg Intravenous Given 08/15/17 1723)  sodium chloride 0.9 % bolus 1,000 mL (0 mLs Intravenous Stopped 08/15/17 1857)  sodium chloride 0.9 % bolus 1,000 mL (0 mLs Intravenous Stopped 08/15/17 1924)  famotidine (PEPCID) IVPB 20 mg premix (0 mg Intravenous Stopped 08/15/17 1931)     Initial Impression / Assessment and Plan / ED Course  I have reviewed the triage vital signs and the  nursing notes.  Pertinent labs & imaging results that were available during my care of the patient were reviewed by me and considered in my medical decision making (see chart for details).     Final Clinical Impressions(s) /  ED Diagnoses   Final diagnoses:  Dehydration  Orthostatic hypotension  Heroin abuse (HCC)  Methamphetamine abuse (HCC)  Cocaine abuse (HCC)  patient has polysubstance abuse disorder. Patient reports feeling like she has withdrawal from heroin. She reports feeling agitated and cramping. She has had nausea and not be able to eat or drink.Patient is alert and oriented. No delirium. No signs of withdrawal from alcohol or benzodiazepine. Patient's labs are within normal limits. She was orthostatic. sHe was rehydrated with 3 L with much improvement, she has failed to eat and drink. Patient advised on follow-up plan for outpatient detox. Resources provided. Consultation is been requested for peer mentoring.  New Prescriptions New Prescriptions   FAMOTIDINE (PEPCID) 20 MG TABLET    Take 1 tablet (20 mg total) by mouth 2 (two) times daily.   ONDANSETRON (ZOFRAN ODT) 4 MG DISINTEGRATING TABLET    Take 1 tablet (4 mg total) by mouth every 4 (four) hours as needed for nausea or vomiting.     Arby Barrette, MD 08/15/17 2141

## 2017-08-15 NOTE — ED Triage Notes (Signed)
Patient states she does not drink anymore, but did use meth, heroin, and cocaine yesterday. Patient states she has not taken her psych meds for approx 6 months. Patient states she wants help with detox.

## 2017-08-15 NOTE — ED Notes (Signed)
Patient given [place] sandwich, OJ, and cheese for CBG-97. Dr. Effie Shy notified.

## 2017-08-17 ENCOUNTER — Inpatient Hospital Stay (HOSPITAL_COMMUNITY)
Admission: EM | Admit: 2017-08-17 | Discharge: 2017-08-20 | DRG: 917 | Disposition: A | Discharge status: Home / Self Care | Payer: BLUE CROSS/BLUE SHIELD | Attending: Internal Medicine | Admitting: Internal Medicine

## 2017-08-17 ENCOUNTER — Emergency Department (HOSPITAL_COMMUNITY): Payer: BLUE CROSS/BLUE SHIELD

## 2017-08-17 DIAGNOSIS — B182 Chronic viral hepatitis C: Secondary | ICD-10-CM | POA: Diagnosis present

## 2017-08-17 DIAGNOSIS — Z9884 Bariatric surgery status: Secondary | ICD-10-CM

## 2017-08-17 DIAGNOSIS — G8929 Other chronic pain: Secondary | ICD-10-CM | POA: Diagnosis present

## 2017-08-17 DIAGNOSIS — Z79899 Other long term (current) drug therapy: Secondary | ICD-10-CM | POA: Diagnosis not present

## 2017-08-17 DIAGNOSIS — M545 Low back pain, unspecified: Secondary | ICD-10-CM

## 2017-08-17 DIAGNOSIS — I959 Hypotension, unspecified: Secondary | ICD-10-CM | POA: Diagnosis present

## 2017-08-17 DIAGNOSIS — Z888 Allergy status to other drugs, medicaments and biological substances status: Secondary | ICD-10-CM

## 2017-08-17 DIAGNOSIS — F339 Major depressive disorder, recurrent, unspecified: Secondary | ICD-10-CM | POA: Diagnosis present

## 2017-08-17 DIAGNOSIS — Z885 Allergy status to narcotic agent status: Secondary | ICD-10-CM

## 2017-08-17 DIAGNOSIS — Z886 Allergy status to analgesic agent status: Secondary | ICD-10-CM

## 2017-08-17 DIAGNOSIS — T40601A Poisoning by unspecified narcotics, accidental (unintentional), initial encounter: Secondary | ICD-10-CM | POA: Diagnosis present

## 2017-08-17 DIAGNOSIS — T40604A Poisoning by unspecified narcotics, undetermined, initial encounter: Secondary | ICD-10-CM

## 2017-08-17 DIAGNOSIS — F1920 Other psychoactive substance dependence, uncomplicated: Secondary | ICD-10-CM

## 2017-08-17 DIAGNOSIS — F1510 Other stimulant abuse, uncomplicated: Secondary | ICD-10-CM | POA: Diagnosis present

## 2017-08-17 DIAGNOSIS — G35 Multiple sclerosis: Secondary | ICD-10-CM | POA: Diagnosis present

## 2017-08-17 DIAGNOSIS — R402412 Glasgow coma scale score 13-15, at arrival to emergency department: Secondary | ICD-10-CM | POA: Diagnosis present

## 2017-08-17 DIAGNOSIS — E860 Dehydration: Secondary | ICD-10-CM | POA: Diagnosis present

## 2017-08-17 DIAGNOSIS — Z720 Tobacco use: Secondary | ICD-10-CM

## 2017-08-17 DIAGNOSIS — J45909 Unspecified asthma, uncomplicated: Secondary | ICD-10-CM | POA: Diagnosis present

## 2017-08-17 DIAGNOSIS — G43909 Migraine, unspecified, not intractable, without status migrainosus: Secondary | ICD-10-CM | POA: Diagnosis present

## 2017-08-17 DIAGNOSIS — G9341 Metabolic encephalopathy: Secondary | ICD-10-CM | POA: Diagnosis present

## 2017-08-17 DIAGNOSIS — F419 Anxiety disorder, unspecified: Secondary | ICD-10-CM | POA: Diagnosis present

## 2017-08-17 DIAGNOSIS — F1110 Opioid abuse, uncomplicated: Secondary | ICD-10-CM | POA: Diagnosis present

## 2017-08-17 DIAGNOSIS — R945 Abnormal results of liver function studies: Secondary | ICD-10-CM | POA: Diagnosis present

## 2017-08-17 DIAGNOSIS — Z8711 Personal history of peptic ulcer disease: Secondary | ICD-10-CM

## 2017-08-17 DIAGNOSIS — F17210 Nicotine dependence, cigarettes, uncomplicated: Secondary | ICD-10-CM | POA: Diagnosis present

## 2017-08-17 DIAGNOSIS — G9340 Encephalopathy, unspecified: Secondary | ICD-10-CM

## 2017-08-17 DIAGNOSIS — R0681 Apnea, not elsewhere classified: Secondary | ICD-10-CM | POA: Diagnosis present

## 2017-08-17 DIAGNOSIS — K219 Gastro-esophageal reflux disease without esophagitis: Secondary | ICD-10-CM | POA: Diagnosis present

## 2017-08-17 DIAGNOSIS — M797 Fibromyalgia: Secondary | ICD-10-CM | POA: Diagnosis present

## 2017-08-17 DIAGNOSIS — F1020 Alcohol dependence, uncomplicated: Secondary | ICD-10-CM | POA: Diagnosis present

## 2017-08-17 DIAGNOSIS — R001 Bradycardia, unspecified: Secondary | ICD-10-CM | POA: Diagnosis present

## 2017-08-17 DIAGNOSIS — F1410 Cocaine abuse, uncomplicated: Secondary | ICD-10-CM | POA: Diagnosis present

## 2017-08-17 DIAGNOSIS — R7989 Other specified abnormal findings of blood chemistry: Secondary | ICD-10-CM | POA: Diagnosis present

## 2017-08-17 DIAGNOSIS — Z981 Arthrodesis status: Secondary | ICD-10-CM

## 2017-08-17 LAB — COMPREHENSIVE METABOLIC PANEL
ALBUMIN: 2.5 g/dL — AB (ref 3.5–5.0)
ALT: 201 U/L — AB (ref 14–54)
AST: 199 U/L — ABNORMAL HIGH (ref 15–41)
Alkaline Phosphatase: 333 U/L — ABNORMAL HIGH (ref 38–126)
Anion gap: 7 (ref 5–15)
BUN: 8 mg/dL (ref 6–20)
CHLORIDE: 106 mmol/L (ref 101–111)
CO2: 26 mmol/L (ref 22–32)
CREATININE: 0.65 mg/dL (ref 0.44–1.00)
Calcium: 8.3 mg/dL — ABNORMAL LOW (ref 8.9–10.3)
GFR calc non Af Amer: >60 mL/min (ref >60)
GLUCOSE: 77 mg/dL (ref 65–99)
Potassium: 4.4 mmol/L (ref 3.5–5.1)
SODIUM: 139 mmol/L (ref 135–145)
Total Bilirubin: 0.5 mg/dL (ref 0.3–1.2)
Total Protein: 5.9 g/dL — ABNORMAL LOW (ref 6.5–8.1)

## 2017-08-17 LAB — URINALYSIS, ROUTINE W REFLEX MICROSCOPIC
Bilirubin Urine: NEGATIVE
GLUCOSE, UA: NEGATIVE mg/dL
HGB URINE DIPSTICK: NEGATIVE
Ketones, ur: NEGATIVE mg/dL
Leukocytes, UA: NEGATIVE
Nitrite: NEGATIVE
PH: 7 (ref 5.0–8.0)
PROTEIN: NEGATIVE mg/dL
Specific Gravity, Urine: 1.016 (ref 1.005–1.030)

## 2017-08-17 LAB — RAPID URINE DRUG SCREEN, HOSP PERFORMED
AMPHETAMINES: POSITIVE — AB
BARBITURATES: NOT DETECTED
Benzodiazepines: NOT DETECTED
Cocaine: NOT DETECTED
Opiates: POSITIVE — AB
TETRAHYDROCANNABINOL: NOT DETECTED

## 2017-08-17 LAB — CBC WITH DIFFERENTIAL/PLATELET
BASOS ABS: 0 10*3/uL (ref 0.0–0.1)
BASOS PCT: 0 %
EOS PCT: 1 %
Eosinophils Absolute: 0.1 10*3/uL (ref 0.0–0.7)
HCT: 36.9 % (ref 36.0–46.0)
Hemoglobin: 12.1 g/dL (ref 12.0–15.0)
Lymphocytes Relative: 20 %
Lymphs Abs: 1.3 10*3/uL (ref 0.7–4.0)
MCH: 27.2 pg (ref 26.0–34.0)
MCHC: 32.8 g/dL (ref 30.0–36.0)
MCV: 82.9 fL (ref 78.0–100.0)
MONO ABS: 0.6 10*3/uL (ref 0.1–1.0)
Monocytes Relative: 9 %
NEUTROS ABS: 4.4 10*3/uL (ref 1.7–7.7)
NEUTROS PCT: 70 %
PLATELETS: 393 10*3/uL (ref 150–400)
RBC: 4.45 MIL/uL (ref 3.87–5.11)
RDW: 13.3 % (ref 11.5–15.5)
WBC: 6.4 10*3/uL (ref 4.0–10.5)

## 2017-08-17 LAB — PROTIME-INR
INR: 0.98
Prothrombin Time: 12.9 seconds (ref 11.4–15.2)

## 2017-08-17 LAB — ACETAMINOPHEN LEVEL: Acetaminophen (Tylenol), Serum: <10 ug/mL — ABNORMAL LOW (ref 10–30)

## 2017-08-17 LAB — ETHANOL

## 2017-08-17 LAB — CBG MONITORING, ED: GLUCOSE-CAPILLARY: 71 mg/dL (ref 65–99)

## 2017-08-17 LAB — I-STAT BETA HCG BLOOD, ED (MC, WL, AP ONLY): I-stat hCG, quantitative: <5 m[IU]/mL (ref <5)

## 2017-08-17 LAB — AMMONIA: AMMONIA: 27 umol/L (ref 9–35)

## 2017-08-17 LAB — SALICYLATE LEVEL: Salicylate Lvl: <7 mg/dL (ref 2.8–30.0)

## 2017-08-17 MED ORDER — ONDANSETRON HCL 4 MG/2ML IJ SOLN
4.0000 mg | Freq: Three times a day (TID) | INTRAMUSCULAR | Status: DC | PRN
  Administered 2017-08-18 – 2017-08-19 (×2): 4 mg via INTRAVENOUS
  Filled 2017-08-17 (×2): qty 2

## 2017-08-17 MED ORDER — NICOTINE 21 MG/24HR TD PT24
21.0000 mg | MEDICATED_PATCH | Freq: Every day | TRANSDERMAL | Status: DC
  Administered 2017-08-18 – 2017-08-20 (×3): 21 mg via TRANSDERMAL
  Filled 2017-08-17 (×3): qty 1

## 2017-08-17 MED ORDER — SODIUM CHLORIDE 0.9 % IV BOLUS (SEPSIS)
1000.0000 mL | Freq: Once | INTRAVENOUS | Status: AC
Start: 2017-08-17 — End: 2017-08-17
  Administered 2017-08-17: 1000 mL via INTRAVENOUS

## 2017-08-17 MED ORDER — THIAMINE HCL 100 MG/ML IJ SOLN
100.0000 mg | Freq: Every day | INTRAMUSCULAR | Status: DC
  Administered 2017-08-18: 100 mg via INTRAVENOUS
  Filled 2017-08-17: qty 2

## 2017-08-17 MED ORDER — NALOXONE HCL 0.4 MG/ML IJ SOLN
0.4000 mg | Freq: Once | INTRAMUSCULAR | Status: AC
  Administered 2017-08-17: 0.4 mg via INTRAVENOUS

## 2017-08-17 MED ORDER — VITAMIN B-1 100 MG PO TABS
100.0000 mg | ORAL_TABLET | Freq: Every day | ORAL | Status: DC
  Administered 2017-08-19 – 2017-08-20 (×2): 100 mg via ORAL
  Filled 2017-08-17 (×3): qty 1

## 2017-08-17 MED ORDER — ADULT MULTIVITAMIN W/MINERALS CH
1.0000 | ORAL_TABLET | Freq: Every day | ORAL | Status: DC
  Administered 2017-08-18 – 2017-08-20 (×3): 1 via ORAL
  Filled 2017-08-17 (×4): qty 1

## 2017-08-17 MED ORDER — NALOXONE HCL 0.4 MG/ML IJ SOLN
0.1000 mg | Freq: Once | INTRAMUSCULAR | Status: AC
  Administered 2017-08-17: 0.1 mg via INTRAVENOUS
  Filled 2017-08-17: qty 1

## 2017-08-17 MED ORDER — THIAMINE HCL 100 MG/ML IJ SOLN
100.0000 mg | Freq: Once | INTRAMUSCULAR | Status: AC
  Administered 2017-08-17: 100 mg via INTRAVENOUS
  Filled 2017-08-17: qty 2

## 2017-08-17 MED ORDER — LORAZEPAM 2 MG/ML IJ SOLN: 1.0000 mg | Freq: Four times a day (QID) | INTRAMUSCULAR | Status: DC | PRN

## 2017-08-17 MED ORDER — ENOXAPARIN SODIUM 40 MG/0.4ML ~~LOC~~ SOLN
40.0000 mg | Freq: Every day | SUBCUTANEOUS | Status: DC
  Administered 2017-08-18 – 2017-08-19 (×3): 40 mg via SUBCUTANEOUS
  Filled 2017-08-17 (×4): qty 0.4

## 2017-08-17 MED ORDER — LORAZEPAM 2 MG/ML IJ SOLN: 0.0000 mg | Freq: Two times a day (BID) | INTRAMUSCULAR | Status: DC

## 2017-08-17 MED ORDER — LORAZEPAM 2 MG/ML IJ SOLN: 0.0000 mg | Freq: Four times a day (QID) | INTRAMUSCULAR | Status: DC

## 2017-08-17 MED ORDER — LORAZEPAM 1 MG PO TABS: 1.0000 mg | ORAL_TABLET | Freq: Four times a day (QID) | ORAL | Status: DC | PRN

## 2017-08-17 MED ORDER — SODIUM CHLORIDE 0.9 % IV BOLUS (SEPSIS)
1000.0000 mL | Freq: Once | INTRAVENOUS | Status: AC
  Administered 2017-08-17: 1000 mL via INTRAVENOUS

## 2017-08-17 MED ORDER — SODIUM CHLORIDE 0.9 % IV SOLN
INTRAVENOUS | Status: DC
  Administered 2017-08-18 (×2): via INTRAVENOUS

## 2017-08-17 MED ORDER — FOLIC ACID 1 MG PO TABS
1.0000 mg | ORAL_TABLET | Freq: Every day | ORAL | Status: DC
  Administered 2017-08-18 – 2017-08-20 (×3): 1 mg via ORAL
  Filled 2017-08-17 (×4): qty 1

## 2017-08-17 MED ORDER — NALOXONE HCL 0.4 MG/ML IJ SOLN
0.1000 mg | INTRAMUSCULAR | Status: DC | PRN
  Administered 2017-08-17: 0.1 mg via INTRAVENOUS
  Filled 2017-08-17: qty 5
  Filled 2017-08-17 (×2): qty 1

## 2017-08-17 MED ORDER — ACETAMINOPHEN 650 MG RE SUPP: 650.0000 mg | Freq: Four times a day (QID) | RECTAL | Status: DC | PRN

## 2017-08-17 MED ORDER — NALOXONE HCL 0.4 MG/ML IJ SOLN: 0.1000 mg | INTRAMUSCULAR | Status: DC | PRN

## 2017-08-17 MED ORDER — FAMOTIDINE IN NACL 20-0.9 MG/50ML-% IV SOLN
20.0000 mg | Freq: Two times a day (BID) | INTRAVENOUS | Status: DC
  Administered 2017-08-18 (×2): 20 mg via INTRAVENOUS
  Filled 2017-08-17 (×2): qty 50

## 2017-08-17 NOTE — ED Triage Notes (Signed)
Per EMS, Pt recovering from heroine and alcohol use and weaning herself off alone. No methadone use. Used half of her normal amount of heroine this afternoon, but mother called EMS when she seemed to be "more high than usual". Apneic en route so 0.4 of narcan given. BP low at 88/50 en route so bolus of saline given. BP is 89/55 here on arrival. Hx of hep C, gastric bypass surgery, orthostatic hypotension, and depression. Patient responds to voice and sternal rub and currently sleeping.

## 2017-08-17 NOTE — ED Provider Notes (Signed)
Level V caveat altered mental status. History is taken from patient's mother. Patient's mother reports that she injected herself with heroin into her left arm approximately one hour prior to coming here. She was found less responsive by her mother. She is treated with Narcan by EMS with improvement of level of conscious. On exam patient is somnolent arousable to tactile stimulus. At 5:40 PM after after treatment with Narcan 0.1 mg IV patient is more alert. Opens eyes to verbal stimulus. Follows all commands. Lungs clear to auscultation heart regular rate and rhythm. Skin there are fresh needle marks in the left antecubital fossa   Doug SouJacubowitz, Hend Mccarrell, MD 08/17/17 2333

## 2017-08-17 NOTE — H&P (Signed)
History and Physical    Beth [name] ZOX:[id number] DOB: [birth year] DOA: 08/17/2017  Referring MD/NP/PA:   PCP: Teena Irani, PA-C   Patient coming from:  The patient is coming from home.  At baseline, pt is independent for most of ADL.   Chief Complaint: AMS  HPI: Beth [name] is a 36 y.o. female with medical history significant of polysubstance abuse(heroin, cocaine, alcohol, tobacco, and amphetamine), asthma, GERD, depression, anxiety, and has, migraine headaches, gastric ulcer, HCV, who presents with altered mental status.  Per pt's mother, pt became confused at at about 2:00 PM at home. Mother saw a needle at room.  Per EMS, pt was noted to be confused and apneic en route and EMS. Pt was given 0.4 mg of Narcan along with 500 mL saline bolus given by EDM. Pt was given Narcan 0.1 mg IV at 5: 40 PM, patient becomes more alert, but still falls asleep quickly before fully answering questions. Pt admitted that she injected heroine. Patient states that she had chest pain because of anxiety early, but currently no chest pain. Mother states that the patient had nausea and vomiting yesterday, but not today. Currently patient does not have nausea, vomiting or diarrhea noted. She moves all extremities normally. Patient was initially hypotensive with blood pressure 89/55 which improved to 100/71 after 1 L normal saline bolus in ED.  ED Course: pt was found to have positive UDS for opiate and amphetamine, Tylenol level less than 10, salicylate level less than 7, alcohol level less than 10, negative pregnancy test, WBC 6.4, electrolytes renal function okay, abnormal liver functions with ALP 333, AST 199, ALT 201, total bilirubin 0.5, temperature normal, bradycardia, RR 7-10, oxygen saturation 98% on 2 L oxygen. CT head is negative for acute intracranial abnormalities. Patient is admitted to stepdown bed as inpatient.  Review of Systems: Could not be reviewed accurately due to altered mental  status.  Allergy:  Allergies  Allergen Reactions  . Cymbalta [Duloxetine Hcl] Other (See Comments)    Made body jerk and twitch, spasms  . Dilaudid [Hydromorphone Hcl] Other (See Comments)    Anger, aggression   . Naltrexone Swelling  . Nsaids Nausea And Vomiting  . Zolpidem Other (See Comments)    Sleep walking  . Abilify [Aripiprazole] Itching    Clenching of teeth, Grinds teeth  . Meperidine     Mental Status Changes, Agitation, swearing  . Prednisone Other (See Comments)    Steroids in general    Past Medical History:  Diagnosis Date  . Acid reflux   . Anxiety   . Asthma   . Chronic diarrhea    self reported  . Chronic nausea    self reported  . Chronic pain    self reported chronic pain, back pain, fibromyalgia  . Delirium tremens (HCC)   . Depression   . ETOH abuse   . Gastric ulcer   . History of blood transfusion   . Insomnia   . Migraine   . Multiple sclerosis (HCC) 2005   diagnosed in [place], failed steroids, no other medication  . Polysubstance abuse (HCC)   . Substance abuse [facility])     Past Surgical History:  Procedure Laterality Date  . DIAGNOSTIC LAPAROSCOPY  2003   repair of perforation at [facility] anastamosis   . GASTRIC BYPASS    . LAPAROTOMY N/A 08/18/2014   Procedure: EXPLORATORY LAPAROTOMY, Patch graft of gastric perforation;  Surgeon: Abigail Miyamoto, MD;  Location: [facility] OR;  Service: General;  Laterality: N/A;  . LUMBAR LAMINECTOMY/DECOMPRESSION MICRODISCECTOMY Right 04/14/2014   Procedure: LUMBAR LAMINECTOMY/DECOMPRESSION MICRODISCECTOMY LEVEL L4-5;  Surgeon: Maeola Harman, MD;  Location: [facility] NEURO ORS;  Service: Neurosurgery;  Laterality: Right;  LUMBAR LAMINECTOMY/DECOMPRESSION MICRODISCECTOMY LEVEL L4-5    Social History:  reports that she has been smoking Cigarettes.  She has been smoking about 0.20 packs per day. She has never used smokeless tobacco. She reports that she does not drink alcohol or use drugs.  Family History:  Family History   Problem Relation Age of Onset  . Hypertension Paternal Grandfather   . Colon cancer Paternal Grandfather 13  . Asthma Paternal Grandmother   . Hypertension Paternal Grandmother   . Breast cancer Paternal Grandmother   . Cancer Paternal Grandmother        lung   . Diabetes Maternal Grandmother   . Diabetes Maternal Grandfather   . Hypertension Father   . Alcohol abuse Father   . Urinary tract infection Mother   . Stroke Mother 46       3 strokes      Prior to Admission medications   Medication Sig Start Date End Date Taking? Authorizing Provider  Cholecalciferol (VITAMIN D) 2000 units CAPS Take 2,000 Units by mouth daily.   Yes [provider]  cyclobenzaprine (FLEXERIL) 10 MG tablet Take 10-20 mg by mouth 3 (three) times daily. As needed for muscle spasms 08/03/17  Yes [provider]  Dimethyl Fumarate 240 MG CPDR Take 240 mg by mouth 2 (two) times daily. Take 1 capsule by mouth 2 times daily. Start this dose after completing 120 mg capsules 09/18/16 09/18/17 Yes [provider]  famotidine (PEPCID) 20 MG tablet Take 1 tablet (20 mg total) by mouth 2 (two) times daily. 08/15/17  Yes Arby Barrette, MD  lamoTRIgine (LAMICTAL) 150 MG tablet Take 150 mg by mouth 2 (two) times daily.  03/29/17  Yes [provider]  Multiple Vitamin (MULTIVITAMIN WITH MINERALS) TABS tablet Take 1 tablet by mouth daily. 08/16/15  Yes Adonis Brook, NP  Multiple Vitamins-Minerals (HAIR/SKIN/NAILS PO) Take 1 tablet by mouth daily.   Yes [provider]  ondansetron (ZOFRAN ODT) 4 MG disintegrating tablet Take 1 tablet (4 mg total) by mouth every 4 (four) hours as needed for nausea or vomiting. 08/15/17  Yes Arby Barrette, MD  risperiDONE (RISPERDAL) 0.5 MG tablet Take 1 mg by mouth at bedtime. 05/20/17  Yes [provider]  SUMAtriptan (IMITREX) 50 MG tablet Take 1 tablet (50 mg total) by mouth every 2 (two) hours as needed for migraine or headache. 05/30/15   Yes Armandina Stammer I, NP  tiZANidine (ZANAFLEX) 4 MG tablet TAKE ONE TABLET (4 MG TOTAL) BY MOUTH 3 (THREE) TIMES A DAY. 08/30/15  Yes [provider]  topiramate (TOPAMAX) 100 MG tablet Take 100 mg by mouth 2 (two) times daily.    Yes [provider]  valACYclovir (VALTREX) 500 MG tablet Take 500 mg by mouth daily. 06/28/17  Yes [provider]  benztropine (COGENTIN) 0.5 MG tablet Take 1 tablet (0.5 mg total) by mouth at bedtime. Patient not taking: Reported on 08/17/2017 09/12/15 08/15/17  Arfeen, Phillips Grout, MD  FLUoxetine (PROZAC) 40 MG capsule Take 1 capsule (40 mg total) by mouth at bedtime. Patient not taking: Reported on 08/15/2017 09/12/15   Cleotis Nipper, MD  HYDROcodone-acetaminophen (NORCO/VICODIN) 5-325 MG tablet Take 1-2 tablets every 6 hours as needed for severe pain Patient not taking: Reported on 08/15/2017 10/27/15   Renne Crigler,  PA-C  naproxen (NAPROSYN) 500 MG tablet Take 1 tablet (500 mg total) by mouth 2 (two) times daily. Patient not taking: Reported on 08/15/2017 10/27/15   Renne Crigler, PA-C  omeprazole (PRILOSEC) 20 MG capsule Take 1 capsule (20 mg total) by mouth daily. For acid reflux Patient not taking: Reported on 08/15/2017 05/30/15   Armandina Stammer I, NP  risperiDONE (RISPERDAL) 1 MG tablet Take 1 tablet (1 mg total) by mouth at bedtime. Patient not taking: Reported on 08/17/2017 09/12/15 09/11/16  Cleotis Nipper, MD    Physical Exam: Vitals:   08/17/17 2000 08/17/17 2046 08/17/17 2130 08/17/17 2200  BP: (!) 92/56 100/71 (!) 100/53 104/66  Pulse: (!) 51 (!) 52 (!) 37 (!) 56  Resp: 10 13 (!) 23 10  Temp:      TempSrc:      SpO2: 98% 100%     General: Not in acute distress HEENT:       Eyes: PERRL, EOMI, no scleral icterus.       ENT: No discharge from the ears and nose, no pharynx injection, no tonsillar enlargement.        Neck: No JVD, no bruit, no mass felt. Heme: No neck lymph node enlargement. Cardiac: S1/S2, RRR, No  murmurs, No gallops or rubs. Respiratory:  No rales, wheezing, rhonchi or rubs. GI: Soft, nondistended, nontender, no rebound pain, no organomegaly, BS present. GU: No hematuria Ext: No pitting leg edema bilaterally. 2+DP/PT pulse bilaterally. Musculoskeletal: No joint deformities, No joint redness or warmth, no limitation of ROM in spin. Skin: No rashes.  Neuro: confused, very sleepy and drowsy, but arousable, oriented X3, following command partially. Cranial nerves II-XII grossly intact, moves all extremities normally. Psych: Patient is not psychotic  Labs on Admission: I have personally reviewed following labs and imaging studies  CBC:  Recent Labs Lab 08/15/17 1710 08/17/17 1633  WBC 6.6 6.4  NEUTROABS 4.6 4.4  HGB 13.2 12.1  HCT 39.8 36.9  MCV 82.9 82.9  PLT 347 393   Basic Metabolic Panel:  Recent Labs Lab 08/15/17 1710 08/17/17 1633  NA 141 139  K 3.6 4.4  CL 108 106  CO2 25 26  GLUCOSE 89 77  BUN 7 8  CREATININE 0.69 0.65  CALCIUM 8.7* 8.3*   GFR: Estimated Creatinine Clearance: 91 mL/min (by C-G formula based on SCr of 0.65 mg/dL). Liver Function Tests:  Recent Labs Lab 08/17/17 1633  AST 199*  ALT 201*  ALKPHOS 333*  BILITOT 0.5  PROT 5.9*  ALBUMIN 2.5*   No results for input(s): LIPASE, AMYLASE in the last 168 hours.  Recent Labs Lab 08/17/17 2136  AMMONIA 27   Coagulation Profile: No results for input(s): INR, PROTIME in the last 168 hours. Cardiac Enzymes: No results for input(s): CKTOTAL, CKMB, CKMBINDEX, TROPONINI in the last 168 hours. BNP (last 3 results) No results for input(s): PROBNP in the last 8760 hours. HbA1C: No results for input(s): HGBA1C in the last 72 hours. CBG:  Recent Labs Lab 08/15/17 1601 08/17/17 1644  GLUCAP 100* 71   Lipid Profile: No results for input(s): CHOL, HDL, LDLCALC, TRIG, CHOLHDL, LDLDIRECT in the last 72 hours. Thyroid Function Tests: No results for input(s): TSH, T4TOTAL, FREET4, T3FREE,  THYROIDAB in the last 72 hours. Anemia Panel: No results for input(s): VITAMINB12, FOLATE, FERRITIN, TIBC, IRON, RETICCTPCT in the last 72 hours. Urine analysis:    Component Value Date/Time   COLORURINE YELLOW 08/17/2017 1945   APPEARANCEUR HAZY (A) 08/17/2017 1945  LABSPEC 1.016 08/17/2017 1945   PHURINE 7.0 08/17/2017 1945   GLUCOSEU NEGATIVE 08/17/2017 1945   HGBUR NEGATIVE 08/17/2017 1945   BILIRUBINUR NEGATIVE 08/17/2017 1945   BILIRUBINUR NEG 03/20/2012 1701   KETONESUR NEGATIVE 08/17/2017 1945   PROTEINUR NEGATIVE 08/17/2017 1945   UROBILINOGEN 0.2 07/18/2015 2337   NITRITE NEGATIVE 08/17/2017 1945   LEUKOCYTESUR NEGATIVE 08/17/2017 1945   Sepsis Labs: @LABRCNTIP (procalcitonin:4,lacticidven:4) )No results found for this or any previous visit (from the past 240 hour(s)).   Radiological Exams on Admission: Ct Head Wo Contrast  Result Date: 08/17/2017 CLINICAL DATA:  Altered level of consciousness. EXAM: CT HEAD WITHOUT CONTRAST TECHNIQUE: Contiguous axial images were obtained from the base of the skull through the vertex without intravenous contrast. COMPARISON:  CT head 01/12/2015 FINDINGS: Brain: No evidence of acute infarction, hemorrhage, hydrocephalus, extra-axial collection or mass lesion/mass effect. Vascular: Negative for hyperdense vessel Skull: Negative Sinuses/Orbits: Negative Other: None IMPRESSION: Negative CT head Electronically Signed   By: Marlan Palauharles  Clark M.D.   On: 08/17/2017 21:44     EKG: Independently reviewed.  Sinus rhythm, QTC 493, no ischemic change.  Assessment/Plan Principal Problem:   Acute metabolic encephalopathy Active Problems:   Multiple sclerosis (HCC)   Acid reflux   Alcohol use disorder, moderate, dependence (HCC)   Polysubstance dependence (HCC)   Major depressive disorder, recurrent episode (HCC)   Heroin abuse (HCC)   Tobacco abuse   Hypotension   Abnormal LFTs  Acute metabolic encephalopathy: most likely due to heroine and  amphetamine abuse given positive UDS for pulse. Patient was initially hypotensive and apnea. She responded to when necessary Narcan and IV fluid. Currently blood pressure is SBP>100. RR is 10-15. Her mental status is Improving. CT head is negative for acute intracranial abnormalities.  -will admit to SDU as ipt -frequent neuro check -Continue when necessary Narcann 0.1 to 0.4 mg  -IVF: 3L NS bolus and then 125 cc/h -NPO -Hold all oral meds until pt fully wakes up  Hypotension: likely due to combination of heroine abuse and dehydration. Blood pressure responded to IV fluid. Currently hemodynamically stable. No signs of infection. -IV fluids as above  Multiple sclerosis (HCC): -hold oral dimethyl Funarate -check End-tidal CO2  Abnormal LFTs and HCV. Pt is known to have HCV. Mother states that the patient was not treated yet. They're making appointment with GI. -avoid tylenol -check Ammonia level -check HIV Ab  GERD: -Pepcid IV  Major depressive disorder, recurrent episode (HCC): -hold oral meds now  Polysubstance dependence: including heroin, cocaine, alcohol, tobacco, and amphetamine. -Need counseling when patient mental status improves -nicotine patch -CiWA protocol -consult to SW  DVT ppx: SQ Lovenox Code Status: Full code Family Communication:  Yes, patient's parents  at bed side Disposition Plan:  Anticipate discharge back to previous home environment Consults called:  none Admission status:  SDU/inpation       Date of Service 08/17/2017    Lorretta HarpIU, Argil Mahl [facility] Pager [phone]  If 7PM-7AM, please contact night-coverage [email or web address] Password [email or web address] 08/17/2017, 10:29 PM
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
## 2017-08-18 DIAGNOSIS — G9340 Encephalopathy, unspecified: Secondary | ICD-10-CM

## 2017-08-18 DIAGNOSIS — F339 Major depressive disorder, recurrent, unspecified: Secondary | ICD-10-CM

## 2017-08-18 LAB — COMPREHENSIVE METABOLIC PANEL
ALBUMIN: 2.3 g/dL — AB (ref 3.5–5.0)
ALK PHOS: 263 U/L — AB (ref 38–126)
ALT: 142 U/L — ABNORMAL HIGH (ref 14–54)
AST: 104 U/L — AB (ref 15–41)
Anion gap: 5 (ref 5–15)
BILIRUBIN TOTAL: 0.6 mg/dL (ref 0.3–1.2)
BUN: 9 mg/dL (ref 6–20)
CALCIUM: 8.3 mg/dL — AB (ref 8.9–10.3)
CO2: 27 mmol/L (ref 22–32)
CREATININE: 0.6 mg/dL (ref 0.44–1.00)
Chloride: 109 mmol/L (ref 101–111)
GFR calc Af Amer: >60 mL/min (ref >60)
GLUCOSE: 72 mg/dL (ref 65–99)
Potassium: 4.2 mmol/L (ref 3.5–5.1)
Sodium: 141 mmol/L (ref 135–145)
TOTAL PROTEIN: 5.3 g/dL — AB (ref 6.5–8.1)

## 2017-08-18 LAB — MRSA PCR SCREENING: MRSA BY PCR: NEGATIVE

## 2017-08-18 LAB — CBC
HCT: 36 % (ref 36.0–46.0)
Hemoglobin: 11.5 g/dL — ABNORMAL LOW (ref 12.0–15.0)
MCH: 27.1 pg (ref 26.0–34.0)
MCHC: 31.9 g/dL (ref 30.0–36.0)
MCV: 84.7 fL (ref 78.0–100.0)
PLATELETS: 323 10*3/uL (ref 150–400)
RBC: 4.25 MIL/uL (ref 3.87–5.11)
RDW: 13.7 % (ref 11.5–15.5)
WBC: 6 10*3/uL (ref 4.0–10.5)

## 2017-08-18 LAB — HIV ANTIBODY (ROUTINE TESTING W REFLEX): HIV Screen 4th Generation wRfx: NONREACTIVE

## 2017-08-18 MED ORDER — PROCHLORPERAZINE EDISYLATE 5 MG/ML IJ SOLN
10.0000 mg | Freq: Four times a day (QID) | INTRAMUSCULAR | Status: DC | PRN
  Administered 2017-08-18 – 2017-08-19 (×2): 10 mg via INTRAVENOUS
  Filled 2017-08-18 (×2): qty 2

## 2017-08-18 MED ORDER — RISPERIDONE 0.25 MG PO TABS
0.5000 mg | ORAL_TABLET | Freq: Every day | ORAL | Status: DC
  Administered 2017-08-18 – 2017-08-19 (×2): 0.5 mg via ORAL
  Filled 2017-08-18 (×3): qty 2

## 2017-08-18 MED ORDER — LORAZEPAM 0.5 MG PO TABS
0.5000 mg | ORAL_TABLET | Freq: Three times a day (TID) | ORAL | Status: DC | PRN
  Administered 2017-08-18 – 2017-08-19 (×2): 0.5 mg via ORAL
  Filled 2017-08-18 (×2): qty 1

## 2017-08-18 MED ORDER — PANTOPRAZOLE SODIUM 40 MG PO TBEC
40.0000 mg | DELAYED_RELEASE_TABLET | Freq: Every day | ORAL | Status: DC
  Administered 2017-08-18 – 2017-08-20 (×3): 40 mg via ORAL
  Filled 2017-08-18 (×3): qty 1

## 2017-08-18 MED ORDER — LAMOTRIGINE 25 MG PO TABS
150.0000 mg | ORAL_TABLET | Freq: Two times a day (BID) | ORAL | Status: DC
  Administered 2017-08-18 – 2017-08-20 (×5): 150 mg via ORAL
  Filled 2017-08-18 (×5): qty 1

## 2017-08-18 NOTE — Progress Notes (Signed)
RN notified by family that patient had a tampon in place. Exact length of time in place is unknown; Mother estimates tampon has been in since 0830 on 08/17/17. Tampon removed by patient's mother; pt given sanitary pads for use during hospital admission. Will continue to monitor closely.
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
## 2017-08-18 NOTE — Progress Notes (Signed)
Family requesting pt be discharged to [facility]. Social Worker made aware.

## 2017-08-19 ENCOUNTER — Encounter (HOSPITAL_COMMUNITY): Payer: Self-pay

## 2017-08-19 ENCOUNTER — Inpatient Hospital Stay (HOSPITAL_COMMUNITY): Payer: BLUE CROSS/BLUE SHIELD

## 2017-08-19 DIAGNOSIS — T40604A Poisoning by unspecified narcotics, undetermined, initial encounter: Secondary | ICD-10-CM

## 2017-08-19 DIAGNOSIS — M545 Low back pain: Secondary | ICD-10-CM

## 2017-08-19 LAB — CBC
HEMATOCRIT: 40.3 % (ref 36.0–46.0)
HEMATOCRIT: 42.6 % (ref 36.0–46.0)
HEMOGLOBIN: 13.2 g/dL (ref 12.0–15.0)
HEMOGLOBIN: 14.4 g/dL (ref 12.0–15.0)
MCH: 26.7 pg (ref 26.0–34.0)
MCH: 27.4 pg (ref 26.0–34.0)
MCHC: 32.8 g/dL (ref 30.0–36.0)
MCHC: 33.8 g/dL (ref 30.0–36.0)
MCV: 81.6 fL (ref 78.0–100.0)
MCV: 81.1 fL (ref 78.0–100.0)
Platelets: 373 10*3/uL (ref 150–400)
Platelets: 411 10*3/uL — ABNORMAL HIGH (ref 150–400)
RBC: 4.94 MIL/uL (ref 3.87–5.11)
RBC: 5.25 MIL/uL — ABNORMAL HIGH (ref 3.87–5.11)
RDW: 13 % (ref 11.5–15.5)
RDW: 12.9 % (ref 11.5–15.5)
WBC: 6.6 10*3/uL (ref 4.0–10.5)
WBC: 6.5 10*3/uL (ref 4.0–10.5)

## 2017-08-19 LAB — BASIC METABOLIC PANEL
Anion gap: 9 (ref 5–15)
BUN: 6 mg/dL (ref 6–20)
CHLORIDE: 106 mmol/L (ref 101–111)
CO2: 26 mmol/L (ref 22–32)
CREATININE: 0.73 mg/dL (ref 0.44–1.00)
Calcium: 8.5 mg/dL — ABNORMAL LOW (ref 8.9–10.3)
GFR calc Af Amer: >60 mL/min (ref >60)
GFR calc non Af Amer: >60 mL/min (ref >60)
Glucose, Bld: 112 mg/dL — ABNORMAL HIGH (ref 65–99)
POTASSIUM: 3.8 mmol/L (ref 3.5–5.1)
SODIUM: 141 mmol/L (ref 135–145)

## 2017-08-19 LAB — COMPREHENSIVE METABOLIC PANEL
ALK PHOS: 228 U/L — AB (ref 38–126)
ALT: 99 U/L — ABNORMAL HIGH (ref 14–54)
ANION GAP: 6 (ref 5–15)
AST: 45 U/L — ABNORMAL HIGH (ref 15–41)
Albumin: 2.3 g/dL — ABNORMAL LOW (ref 3.5–5.0)
BILIRUBIN TOTAL: 0.5 mg/dL (ref 0.3–1.2)
BUN: 6 mg/dL (ref 6–20)
CHLORIDE: 105 mmol/L (ref 101–111)
CO2: 27 mmol/L (ref 22–32)
Calcium: 8.3 mg/dL — ABNORMAL LOW (ref 8.9–10.3)
Creatinine, Ser: 0.65 mg/dL (ref 0.44–1.00)
GFR calc non Af Amer: >60 mL/min (ref >60)
GLUCOSE: 95 mg/dL (ref 65–99)
POTASSIUM: 3.9 mmol/L (ref 3.5–5.1)
SODIUM: 138 mmol/L (ref 135–145)
Total Protein: 5.6 g/dL — ABNORMAL LOW (ref 6.5–8.1)

## 2017-08-19 LAB — HCV RNA QUANT
HCV QUANT LOG: 4.057 {Log_IU}/mL (ref >1.70)
HCV QUANT: 11400 [IU]/mL (ref >50)

## 2017-08-19 LAB — MAGNESIUM: MAGNESIUM: 1.8 mg/dL (ref 1.7–2.4)

## 2017-08-19 MED ORDER — ONDANSETRON HCL 4 MG/2ML IJ SOLN
4.0000 mg | Freq: Four times a day (QID) | INTRAMUSCULAR | Status: DC | PRN
  Administered 2017-08-20: 4 mg via INTRAVENOUS
  Filled 2017-08-19: qty 2

## 2017-08-19 MED ORDER — DIMETHYL FUMARATE 240 MG PO CPDR: 240.0000 mg | DELAYED_RELEASE_CAPSULE | Freq: Two times a day (BID) | ORAL | Status: DC

## 2017-08-19 MED ORDER — PROCHLORPERAZINE EDISYLATE 5 MG/ML IJ SOLN
10.0000 mg | INTRAMUSCULAR | Status: DC | PRN
  Administered 2017-08-19: 10 mg via INTRAVENOUS
  Filled 2017-08-19: qty 2

## 2017-08-19 MED ORDER — SUMATRIPTAN SUCCINATE 50 MG PO TABS
100.0000 mg | ORAL_TABLET | Freq: Three times a day (TID) | ORAL | Status: DC | PRN
  Administered 2017-08-19 – 2017-08-20 (×2): 100 mg via ORAL
  Filled 2017-08-19 (×3): qty 2

## 2017-08-19 MED ORDER — LORAZEPAM 0.5 MG PO TABS
0.5000 mg | ORAL_TABLET | Freq: Four times a day (QID) | ORAL | Status: DC | PRN
  Administered 2017-08-19 – 2017-08-20 (×2): 0.5 mg via ORAL
  Filled 2017-08-19 (×2): qty 1

## 2017-08-19 NOTE — Clinical Social Work Note (Addendum)
Clinical Social Work Assessment  Patient Details  Name: Beth [name] MRN: [id number] Date of Birth: 1981-06-08  Date of referral:  08/19/17               Reason for consult:  Substance Use/ETOH Abuse                Permission sought to share information with:  Family Supports Permission granted to share information::  Yes, Verbal Permission Granted, Yes, Release of Information Signed  Name::        Agency::  Residential Treatment facility  Relationship::  Mother   Contact Information:  Orson Eva [phone]  Housing/Transportation Living arrangements for the past 2 months:  Single Family Home Source of Information:  Patient Patient Interpreter Needed:  None Criminal Activity/Legal Involvement Pertinent to Current Situation/Hospitalization:  No - Comment as needed Significant Relationships:  None Lives with:  Parents Do you feel safe going back to the place where you live?  Yes Need for family participation in patient care:  Yes (Comment)  Care giving concerns:  Polysubstance abuse,-cocaine, alcohol, tobacco, and amphetamine admitted for accidental overdose.  Patient is agreeable to residential treatment.  The pt. went to [facility] in back in  [place]. The pt. Mother was working to have patient readmitted  to [facility] [place] residential treatment.  CSW consulted to assist with process. [facility] completed consent for [facility] to recive clinical information.   CSW followed up with the facility intake coordinator. He reports the facility will not be able to accept the patient at this time due to patient acuity." CSW provided the patient and mother with other residential options, CDIOP, and outpatient options.   Patient reports she went to [facility] for six weeks but stop going because of the "drama".  Patient mother reports the patient has anxiety and depression. She is currently taking mood stabilizers. No outpatient provider at this time.    Social Worker  assessment / plan: CSW met with the patient at bedside, explain role and reason for visit-to assist with treatment options for substance use. Patient gave CSW verbal and written consent to talk with her mother and [facility].  CSW spoke with Gwyndolyn Saxon at [facility], he reports the patient does not meet criteria for placement there. Both patient and mother upset [facility] would not admit.  Patient agreeable to call [facility] and complete phone interview. CSW followed up with additional options.   Plan: Follow up with residential treatment.    Employment status:  Unemployed Forensic scientist:  Other (Comment Required) Nurse, mental health) PT Recommendations:  Not assessed at this time,  Information / Referral to community resources:  Residential Substance Abuse Treatment Options, Outpatient Psychiatric Care (Comment Required) Multimedia programmer, AA/NA groups)  Patient/Family's Response to care: Agreeable to care, patient alert but allowing her mother to assist with finding placement to residential facility.   Patient/Family's Understanding of and Emotional Response to Diagnosis, Current Treatment, and Prognosis:  Patient became emotional while explaining to her mother the importance of having clean syringes and someone willing to help provide support and help in the community. Multimedia programmer) Patient understands the affects drug use can have on her health.   Emotional Assessment Appearance:  Developmentally appropriate Attitude/Demeanor/Rapport:    Affect (typically observed):  Accepting, Calm Orientation:  Oriented to Self, Oriented to Place, Oriented to  Time, Oriented to Situation Alcohol / Substance use:  Not Applicable Psych involvement (Current and /or in the community):  No (Comment)  Discharge  Needs  Concerns to be addressed:  Discharge Planning Concerns, Care Coordination Readmission within the last 30 days:  No Current discharge risk:  None, Substance  Abuse Barriers to Discharge:  Continued Medical Work up, Active Substance Use   Lia Hopping, LCSW 08/19/2017, 1:34 PM

## 2017-08-19 NOTE — Progress Notes (Signed)
[facility] PROGRESS NOTE  Hartford PoliKrista A [name] ZOX:[id number] DOB: August 08, 1981 DOA: 08/17/2017 PCP: Beth [name], Beth C, PA-[name]  Interim summary and HPI 36 y.o. female with medical history significant of polysubstance abuse(heroin, cocaine, alcohol, tobacco, and amphetamine), asthma, GERD, depression, anxiety,  migraine headaches, gastric ulcer and hx of HCV; presented with altered mental status due to accidental overdose.  Assessment/Plan: Acute metabolic encephalopathy: most likely due to heroine and amphetamine abuse given positive UDS. Patient was initially hypotensive and with apnea. -responded properly to supportive care and use of Narcan -now AAOX3 and with stable VS -tolerating diet  -continue IVF's, but adjust rate -continue resuming and adjusting home meds -social work consulted for further assistance with resources and discharge options; [facility]  Hypotension: likely due to combination of heroine abuse and dehydration.  -resolved with IVF's -BP is now stable   Multiple sclerosis (HCC): -will resume oral dimethyl Fumarate  -patient w/o neurologic deficit or signs of active MS crisis currently  Abnormal LFTs and hx of HCV. Pt is known to have HCV. Mother states that the patient has not been treated yet. They're making appointment with GI. -follow HIV Ab and HCV pcr -further treatment to be done as an outpatient   GERD -will continue using PPI  Major depressive disorder, recurrent episode (HCC): -will slowly start to resume home medication regimen  -no SI or hallucinations appreciated currently.  Polysubstance dependence: including heroin, cocaine, alcohol, tobacco, and amphetamine. -uds positive for opiates and amphetamines -on admission mother reported she found daughter semi-unconscious and with syringe at home. -after discussing with patient she reported using heroin; without intentions to hurt herself  -no alcohol seen in her  blood levels and she denies alcohol use -patient will like assistance for detox -currently not expressing active signs of withdrawal -CIWA score 1-2 -SW consulted -will continue PRN ativan by mouth  -cessation counseling provided -PRN Narcan ordered -continue nicotine patch  HA/migraine -will use PRN Imitrex   Code Status: Full Family Communication: mother at bedside  Disposition Plan: will transfer out of stepdown, check lumbar spine x-ray and adjust anxiety medications. Will also start PRN imitrex for HA (patient actively taking it at home). SW assisting with discharge and [facility]  Consultants:  None   Procedures:  See below for x-ray reports   Antibiotics:  None   HPI/Subjective: Afebrile; no CP, no SOB. AAOX3 and with good interaction. Patient reported lower back pain and anxiety.  Objective: Vitals:   08/19/17 0807 08/19/17 1100  BP: 135/71 (!) 165/84  Pulse: 87 68  Resp:    Temp:    SpO2: 99% 99%    Intake/Output Summary (Last 24 hours) at 08/19/17 1218 Last data filed at 08/19/17 1100  Gross per 24 hour  Intake          2540.83 ml  Output              200 ml  Net          2340.83 ml   Filed Weights   08/17/17 2355 08/18/17 0400 08/19/17 0308  Weight: 66.1 kg (145 lb 11.6 oz) 66.1 kg (145 lb 11.6 oz) 66.4 kg (146 lb 6.2 oz)    Exam:   General: no fever, no CP, no SOB. Hemodynamically stable. No nausea, no vomiting. Reporting low back pain and HA's. Patient also expressed feeling anxious and scared.   Cardiovascular: S1 and S2, no rubs, no gallops  Respiratory: good air movement,  no wheezing, no crackles, normal resp effort.   Abdomen: soft, NT, ND, positive BS  Musculoskeletal: no edema, no cyanosis    Data Reviewed: Basic Metabolic Panel:  Recent Labs Lab 08/15/17 1710 08/17/17 1633 08/18/17 0339 08/19/17 0351  NA 141 139 141 138  K 3.6 4.4 4.2 3.9  CL 108 106 109 105  CO2 25 26 27  27   GLUCOSE 89 77 72 95  BUN 7 8 9 6   CREATININE 0.69 0.65 0.60 0.65  CALCIUM 8.7* 8.3* 8.3* 8.3*   Liver Function Tests:  Recent Labs Lab 08/17/17 1633 08/18/17 0339 08/19/17 0351  AST 199* 104* 45*  ALT 201* 142* 99*  ALKPHOS 333* 263* 228*  BILITOT 0.5 0.6 0.5  PROT 5.9* 5.3* 5.6*  ALBUMIN 2.5* 2.3* 2.3*    Recent Labs Lab 08/17/17 2136  AMMONIA 27   CBC:  Recent Labs Lab 08/15/17 1710 08/17/17 1633 08/18/17 0339 08/19/17 0351  WBC 6.6 6.4 6.0 6.6  NEUTROABS 4.6 4.4  --   --   HGB 13.2 12.1 11.5* 13.2  HCT 39.8 36.9 36.0 40.3  MCV 82.9 82.9 84.7 81.6  PLT 347 393 323 373   CBG:  Recent Labs Lab 08/15/17 1601 08/17/17 1644  GLUCAP 100* 71    Recent Results (from the past 240 hour(s))  MRSA PCR Screening     Status: None   Collection Time: 08/18/17 12:00 AM  Result Value Ref Range Status   MRSA by PCR NEGATIVE NEGATIVE Final    Comment:        The GeneXpert MRSA Assay (FDA approved for NASAL specimens only), is one component of a comprehensive MRSA colonization surveillance program. It is not intended to diagnose MRSA infection nor to guide or monitor treatment for MRSA infections.      Studies: Ct Head Wo Contrast  Result Date: 08/17/2017 CLINICAL DATA:  Altered level of consciousness. EXAM: CT HEAD WITHOUT CONTRAST TECHNIQUE: Contiguous axial images were obtained from the base of the skull through the vertex without intravenous contrast. COMPARISON:  CT head 01/12/2015 FINDINGS: Brain: No evidence of acute infarction, hemorrhage, hydrocephalus, extra-axial collection or mass lesion/mass effect. Vascular: Negative for hyperdense vessel Skull: Negative Sinuses/Orbits: Negative Other: None IMPRESSION: Negative CT head Electronically Signed   By: Marlan Palau M.D.   On: 08/17/2017 21:44    Scheduled Meds: . enoxaparin (LOVENOX) injection  40 mg Subcutaneous QHS  . folic acid  1 mg Oral Daily  . lamoTRIgine  150 mg Oral BID  .  multivitamin with minerals  1 tablet Oral Daily  . nicotine  21 mg Transdermal Daily  . pantoprazole  40 mg Oral Q1200  . risperiDONE  0.5 mg Oral QHS  . thiamine  100 mg Oral Daily   Or  . thiamine  100 mg Intravenous Daily   Continuous Infusions: . sodium chloride 75 mL/hr at 08/19/17 1100    Time spent: 35 minutes   Vassie Loll  [facility] Pager [phone]. If 7PM-7AM, please contact night-coverage at [email or web address], password [facility] 08/19/2017, 12:18 PM  LOS: 2 days

## 2017-08-19 NOTE — Progress Notes (Signed)
Patient gave CSW verbal and written permission to contact [facility].  Patient was told the admission process can take 3-5 days, patient and mother will follow up from home.   Vivi BarrackNicole Densil Ottey, Theresia MajorsLCSWA, MSW Clinical Social Worker  [phone] 08/19/2017  4:21 PM

## 2017-08-20 DIAGNOSIS — T40601A Poisoning by unspecified narcotics, accidental (unintentional), initial encounter: Secondary | ICD-10-CM

## 2017-08-20 MED ORDER — NICOTINE 21 MG/24HR TD PT24: 21.0000 mg | MEDICATED_PATCH | Freq: Every day | TRANSDERMAL | 0 refills | Status: DC

## 2017-08-20 MED ORDER — OMEPRAZOLE 20 MG PO CPDR: 20.0000 mg | DELAYED_RELEASE_CAPSULE | Freq: Every day | ORAL | 1 refills | Status: DC

## 2017-08-20 MED ORDER — PROCHLORPERAZINE MALEATE 10 MG PO TABS: 10.0000 mg | ORAL_TABLET | Freq: Four times a day (QID) | ORAL | 0 refills | Status: DC | PRN

## 2017-08-20 MED ORDER — ONDANSETRON 4 MG PO TBDP: 4.0000 mg | ORAL_TABLET | ORAL | 0 refills | Status: DC | PRN

## 2017-08-20 MED ORDER — MULTI-VITAMIN/MINERALS PO TABS: 1.0000 | ORAL_TABLET | Freq: Every day | ORAL | 2 refills | Status: AC

## 2017-08-20 NOTE — Care Management Note (Signed)
Case Management Note  Patient Details  Name: Beth [name] MRN: [id number] Date of Birth: [birth year]  Subjective/Objective:  36 y/o f admitted w/Acute metabolic encephalopathy. From home. CSW following for [facility]. No CM needs or orders.                  Action/Plan:d/c home.   Expected Discharge Date:                  Expected Discharge Plan:  Home/Self Care  In-House Referral:  Clinical Social Work  Discharge planning Services  CM Consult  Post Acute Care Choice:    Choice offered to:     DME Arranged:    DME Agency:     HH Arranged:    HH Agency:     Status of Service:  Completed, signed off  If discussed at [employer], dates discussed:    Additional Comments:  Lanier Clam, RN 08/20/2017, 11:25 AM

## 2017-08-20 NOTE — Discharge Summary (Signed)
Physician Discharge Summary  Beth [name] ZOX:[id number] DOB: 22-Jan-1981 DOA: 08/17/2017  PCP: Teena Irani, PA-C  Admit date: 08/17/2017 Discharge date: 08/20/2017  Time spent: 35 minutes  Recommendations for Outpatient Follow-up:  Repeat BMET to follow electrolytes and renal function  Make sure patient has follow up with psychiatry and with pain clinic   Discharge Diagnoses:  Principal Problem:   Acute metabolic encephalopathy Active Problems:   Multiple sclerosis (HCC)   Acid reflux   Alcohol use disorder, moderate, dependence (HCC)   Polysubstance dependence (HCC)   Major depressive disorder, recurrent episode (HCC)   Heroin abuse (HCC)   Tobacco abuse   Hypotension   Abnormal LFTs   Encephalopathy acute   Opiate overdose (HCC)   Discharge Condition: stable and improved. Discharge home with subsequent plans to go to [facility] for detox process and to keep herself drugs clean. Instructed to follow up with PCP in 1 week.  Diet recommendation: regular diet   Filed Weights   08/17/17 2355 08/18/17 0400 08/19/17 0308  Weight: 66.1 kg (145 lb 11.6 oz) 66.1 kg (145 lb 11.6 oz) 66.4 kg (146 lb 6.2 oz)    History of present illness:  36 y.o.femalewith medical history significant of polysubstance abuse(heroin, cocaine, alcohol, tobacco, and amphetamine), asthma, GERD, depression, anxiety,  migraine headaches, gastric ulcer and hx of HCV; presented with altered mental status due to accidental overdose.   Hospital Course:  Acute metabolic encephalopathy: most likely due to heroine and amphetamine abuse given positive UDS. Patient was initially hypotensive and with apnea. -responded properly to supportive care and use of Narcan -now AAOX3 and with stable VS -tolerating diet  -advise to keep herself well hydrated -social work consulted for further assistance with resources and discharge options; [facility] acceptance; patient discharge with pending admission at  [facility] for further detox/abstinence purposes.   Hypotension:likely due to combination of heroine abuse and dehydration.  -resolved with IVF's -BP is now stable   Multiple sclerosis (HCC): -will resume oral dimethyl Fumarate  -patient w/o neurologic deficit or signs of active MS crisis currently -continue outpatient follow up  Chronic back pain -x-ray done and no acute abnormalities appreciated -chronic changes from prior surgery seen -continue PRN pain meds -patient actively follow at pain clinic   Abnormal LFTs and hx of HCV. Pt is known to have HCV. Mother states that the patient has not been treated yet. They're making appointment with GI. -follow HIV Ab and HCV pcr -further treatment to be done as an outpatient   GERD -will continue using PPI  Major depressive disorder, recurrent episode (HCC): -no SI or hallucinations appreciated currently. -resume home meds after review them with mom. -patient to follow up with outpatient psychiatry   Polysubstance dependence: including heroin, cocaine, alcohol, tobacco, and amphetamine. -uds positive for opiates and amphetamines -on admission mother reported she found daughter semi-unconscious and with syringe at home. -after discussing with patient she reported using heroin; without intentions to hurt herself. No SI or hallucinations at discharge. -no alcohol seen in her blood levels and she denies alcohol use -patient will like assistance for detox, planning to go to [facility] facility after discharge -currently not expressing any active signs of withdrawal -CIWA score 1-2 at discharge -SW consulted and plan is for patient to go to [facility] after discharge -will continue PRN ativan by mouth (patient has medication at home; mom to assist with drugs administration) -cessation counseling provided once again -continue nicotine patch (prescription given at discharge)  HA/migraine -will  use PRN Imitrex  Procedures:  See below for x-ray  reports   Consultations:  None   Discharge Exam: Vitals:   08/19/17 2312 08/20/17 0555  BP: 124/75 128/86  Pulse: 62 66  Resp: 18 18  Temp: 97.8 F (36.6 C) 98 F (36.7 C)  SpO2: 100% 100%    General: no fever, no CP, no SOB. Hemodynamically stable. No nausea, no vomiting. Reporting low back pain and HA's; but much better control today.  Cardiovascular: S1 and S2, no rubs, no gallops  Respiratory: good air movement, no wheezing, no crackles, normal resp effort.   Abdomen: soft, NT, ND, positive BS  Musculoskeletal: no edema, no cyanosis     Discharge Instructions   Discharge Instructions    Discharge instructions    Complete by:  As directed    Take medications as prescribed  Follow up with PCP in 10 days Arrange follow up with psychiatry service Keep yourself well hydrated  Please follow up with [facility] detox facility for admission, further care and supportive intervention. Please stop the use recreational drugs (IV drugs, alcohol and tobacco)     Current Discharge Medication List    START taking these medications   Details  !! Multiple Vitamins-Minerals (MULTIVITAMIN WITH MINERALS) tablet Take 1 tablet by mouth daily. Qty: 30 tablet, Refills: 2    nicotine (NICODERM CQ - DOSED IN MG/24 HOURS) 21 mg/24hr patch Place 1 patch (21 mg total) onto the skin daily. Qty: 28 patch, Refills: 0    prochlorperazine (COMPAZINE) 10 MG tablet Take 1 tablet (10 mg total) by mouth every 6 (six) hours as needed for refractory nausea / vomiting. Qty: 30 tablet, Refills: 0     !! - Potential duplicate medications found. Please discuss with provider.    CONTINUE these medications which have CHANGED   Details  omeprazole (PRILOSEC) 20 MG capsule Take 1 capsule (20 mg total) by mouth daily. For acid reflux Qty: 30 capsule, Refills: 1    ondansetron (ZOFRAN ODT) 4 MG disintegrating tablet Take 1 tablet (4 mg total) by mouth every 4 (four) hours as needed for nausea or  vomiting. Qty: 30 tablet, Refills: 0      CONTINUE these medications which have NOT CHANGED   Details  Cholecalciferol (VITAMIN D) 2000 units CAPS Take 2,000 Units by mouth daily.    cyclobenzaprine (FLEXERIL) 10 MG tablet Take 10-20 mg by mouth 3 (three) times daily. As needed for muscle spasms Refills: 0    Dimethyl Fumarate 240 MG CPDR Take 240 mg by mouth 2 (two) times daily. Take 1 capsule by mouth 2 times daily. Start this dose after completing 120 mg capsules    famotidine (PEPCID) 20 MG tablet Take 1 tablet (20 mg total) by mouth 2 (two) times daily. Qty: 30 tablet, Refills: 0    lamoTRIgine (LAMICTAL) 150 MG tablet Take 150 mg by mouth 2 (two) times daily.     !! Multiple Vitamin (MULTIVITAMIN WITH MINERALS) TABS tablet Take 1 tablet by mouth daily.    !! Multiple Vitamins-Minerals (HAIR/SKIN/NAILS PO) Take 1 tablet by mouth daily.    risperiDONE (RISPERDAL) 0.5 MG tablet Take 1 mg by mouth at bedtime. Refills: 1    SUMAtriptan (IMITREX) 50 MG tablet Take 1 tablet (50 mg total) by mouth every 2 (two) hours as needed for migraine or headache. Qty: 10 tablet, Refills: 0    topiramate (TOPAMAX) 100 MG tablet Take 100 mg by mouth 2 (two) times daily.     valACYclovir (  VALTREX) 500 MG tablet Take 500 mg by mouth daily.     !! - Potential duplicate medications found. Please discuss with provider.    STOP taking these medications     tiZANidine (ZANAFLEX) 4 MG tablet      benztropine (COGENTIN) 0.5 MG tablet      FLUoxetine (PROZAC) 40 MG capsule      HYDROcodone-acetaminophen (NORCO/VICODIN) 5-325 MG tablet      naproxen (NAPROSYN) 500 MG tablet        Allergies  Allergen Reactions  . Cymbalta [Duloxetine Hcl] Other (See Comments)    Made body jerk and twitch, spasms  . Dilaudid [Hydromorphone Hcl] Other (See Comments)    Anger, aggression   . Naltrexone Swelling  . Nsaids Nausea And Vomiting  . Zolpidem Other (See Comments)    Sleep walking  . Abilify  [Aripiprazole] Itching    Clenching of teeth, Grinds teeth  . Meperidine     Mental Status Changes, Agitation, swearing  . Prednisone Other (See Comments)    Steroids in general   Follow-up Information    Teena Irani, PA-C. Schedule an appointment as soon as possible for a visit in 1 week(s).   Specialty:  Physician Assistant Contact information: [address] [place] [address] [phone]            The results of significant diagnostics from this hospitalization (including imaging, microbiology, ancillary and laboratory) are listed below for reference.    Significant Diagnostic Studies: Dg Lumbar Spine 2-3 Views  Result Date: 08/19/2017 CLINICAL DATA:  Lumbar spine pain starting 2 days ago. EXAM: LUMBAR SPINE - 2-3 VIEW COMPARISON:  03/21/2016 FINDINGS: No fracture. No subluxation. Loss of disc height is seen at L4-5, similar to prior. No worrisome lytic or sclerotic osseous abnormality. SI joints are unremarkable. IMPRESSION: Stable.  Loss of disc height at L4-5. Electronically Signed   By: Kennith Center M.D.   On: 08/19/2017 20:01   Ct Head Wo Contrast  Result Date: 08/17/2017 CLINICAL DATA:  Altered level of consciousness. EXAM: CT HEAD WITHOUT CONTRAST TECHNIQUE: Contiguous axial images were obtained from the base of the skull through the vertex without intravenous contrast. COMPARISON:  CT head 01/12/2015 FINDINGS: Brain: No evidence of acute infarction, hemorrhage, hydrocephalus, extra-axial collection or mass lesion/mass effect. Vascular: Negative for hyperdense vessel Skull: Negative Sinuses/Orbits: Negative Other: None IMPRESSION: Negative CT head Electronically Signed   By: Marlan Palau M.D.   On: 08/17/2017 21:44    Microbiology: Recent Results (from the past 240 hour(s))  MRSA PCR Screening     Status: None   Collection Time: 08/18/17 12:00 AM  Result Value Ref Range Status   MRSA by PCR NEGATIVE NEGATIVE Final    Comment:        The GeneXpert  MRSA Assay (FDA approved for NASAL specimens only), is one component of a comprehensive MRSA colonization surveillance program. It is not intended to diagnose MRSA infection nor to guide or monitor treatment for MRSA infections.      Labs: Basic Metabolic Panel:  Recent Labs Lab 08/15/17 1710 08/17/17 1633 08/18/17 0339 08/19/17 0351 08/19/17 1219  NA 141 139 141 138 141  K 3.6 4.4 4.2 3.9 3.8  CL 108 106 109 105 106  CO2 25 26 27 27 26   GLUCOSE 89 77 72 95 112*  BUN 7 8 9 6 6   CREATININE 0.69 0.65 0.60 0.65 0.73  CALCIUM 8.7* 8.3* 8.3* 8.3* 8.5*  MG  --   --   --   --  1.8   Liver Function Tests:  Recent Labs Lab 08/17/17 1633 08/18/17 0339 08/19/17 0351  AST 199* 104* 45*  ALT 201* 142* 99*  ALKPHOS 333* 263* 228*  BILITOT 0.5 0.6 0.5  PROT 5.9* 5.3* 5.6*  ALBUMIN 2.5* 2.3* 2.3*   No results for input(s): LIPASE, AMYLASE in the last 168 hours.  Recent Labs Lab 08/17/17 2136  AMMONIA 27   CBC:  Recent Labs Lab 08/15/17 1710 08/17/17 1633 08/18/17 0339 08/19/17 0351 08/19/17 1219  WBC 6.6 6.4 6.0 6.6 6.5  NEUTROABS 4.6 4.4  --   --   --   HGB 13.2 12.1 11.5* 13.2 14.4  HCT 39.8 36.9 36.0 40.3 42.6  MCV 82.9 82.9 84.7 81.6 81.1  PLT 347 393 323 373 411*   CBG:  Recent Labs Lab 08/15/17 1601 08/17/17 1644  GLUCAP 100* 71    Signed:  Vassie LollMadera, Kalany Diekmann MD.  [facility] 08/20/2017, 12:19 PM

## 2017-09-18 IMAGING — DX DG LUMBAR SPINE COMPLETE 4+V
5 series · 5 of 5 positions shown · non-contrast
Comparison: 03/11/2015

CLINICAL DATA: Fall, acute low back pain, previous L3 fracture

EXAM:
LUMBAR SPINE - COMPLETE 4+ VIEW

[l-spine ap]
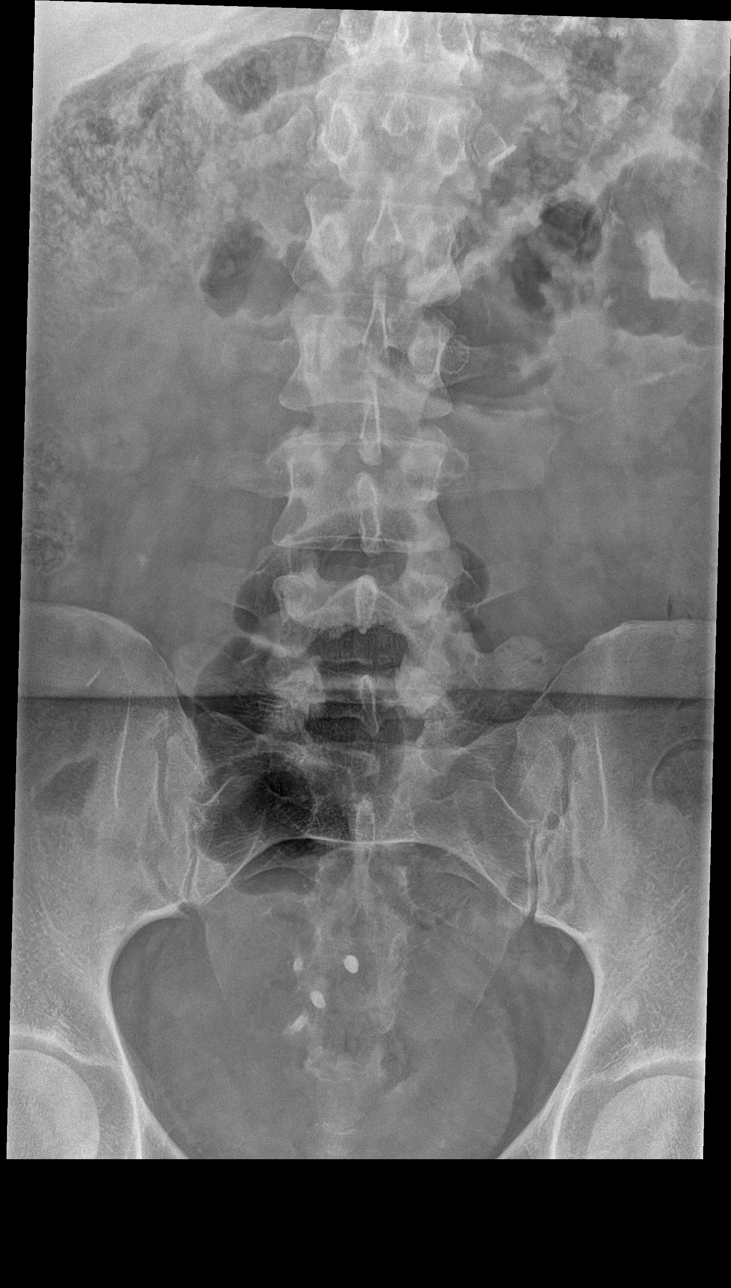

[l-spine obl (1 of 2)]
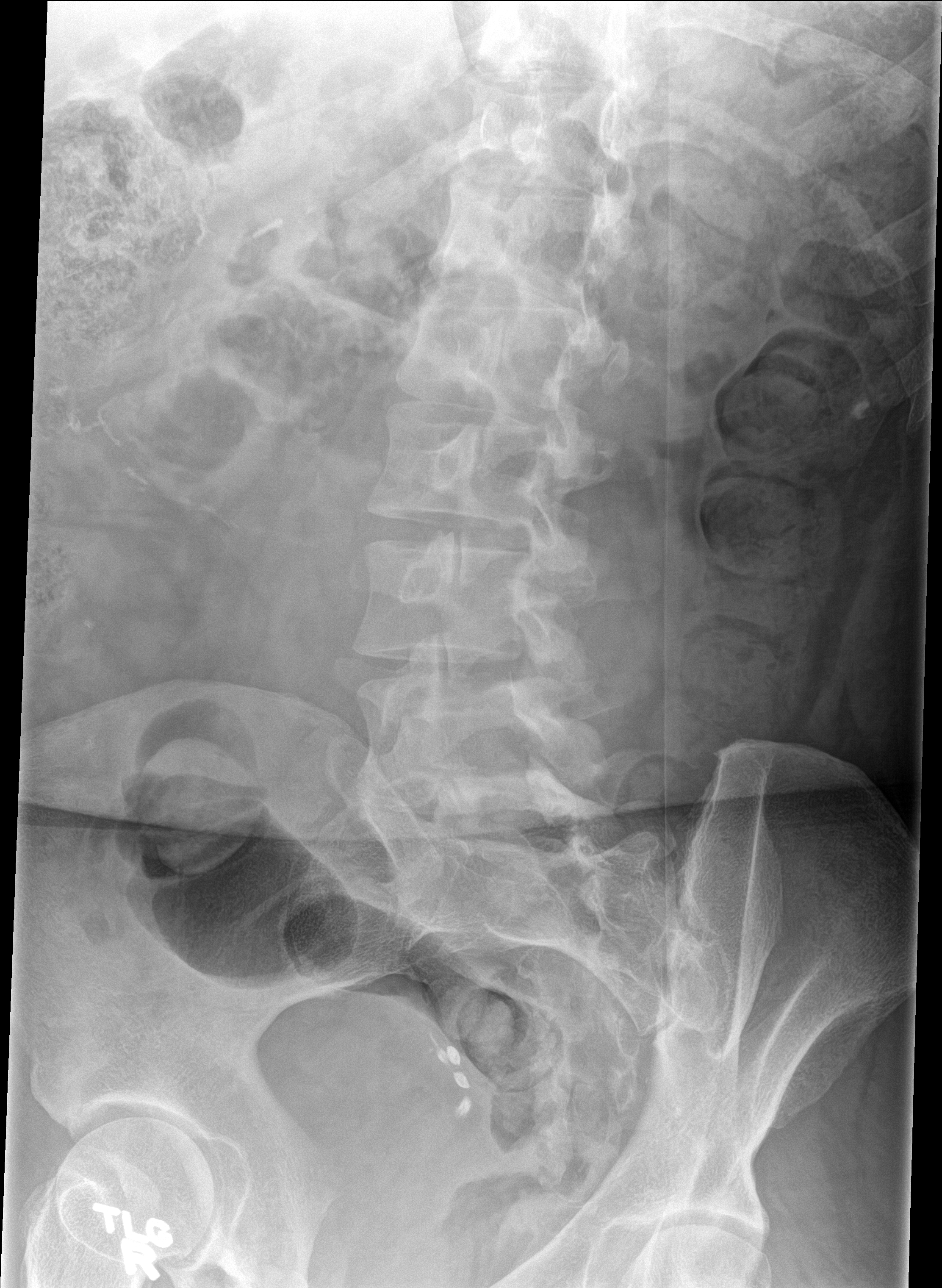

[l-spine obl (2 of 2)]
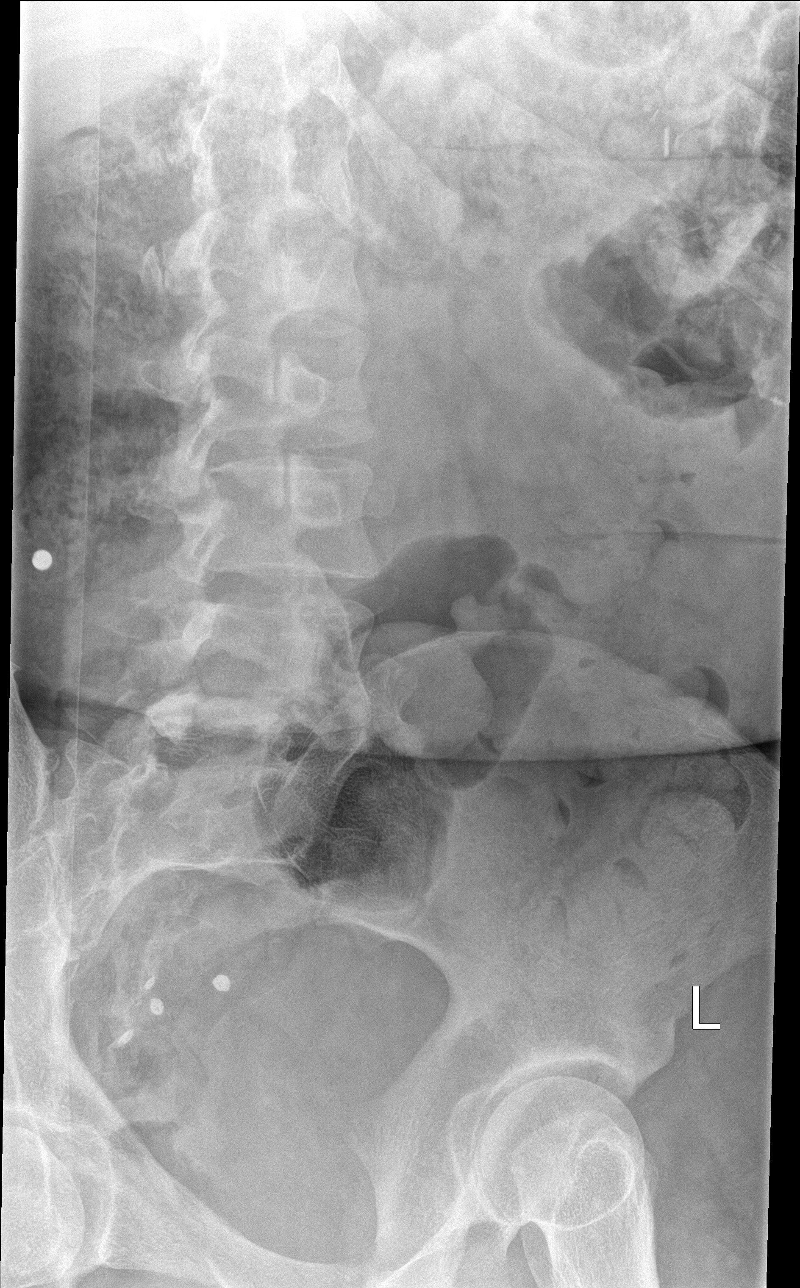

[l-spine lat]
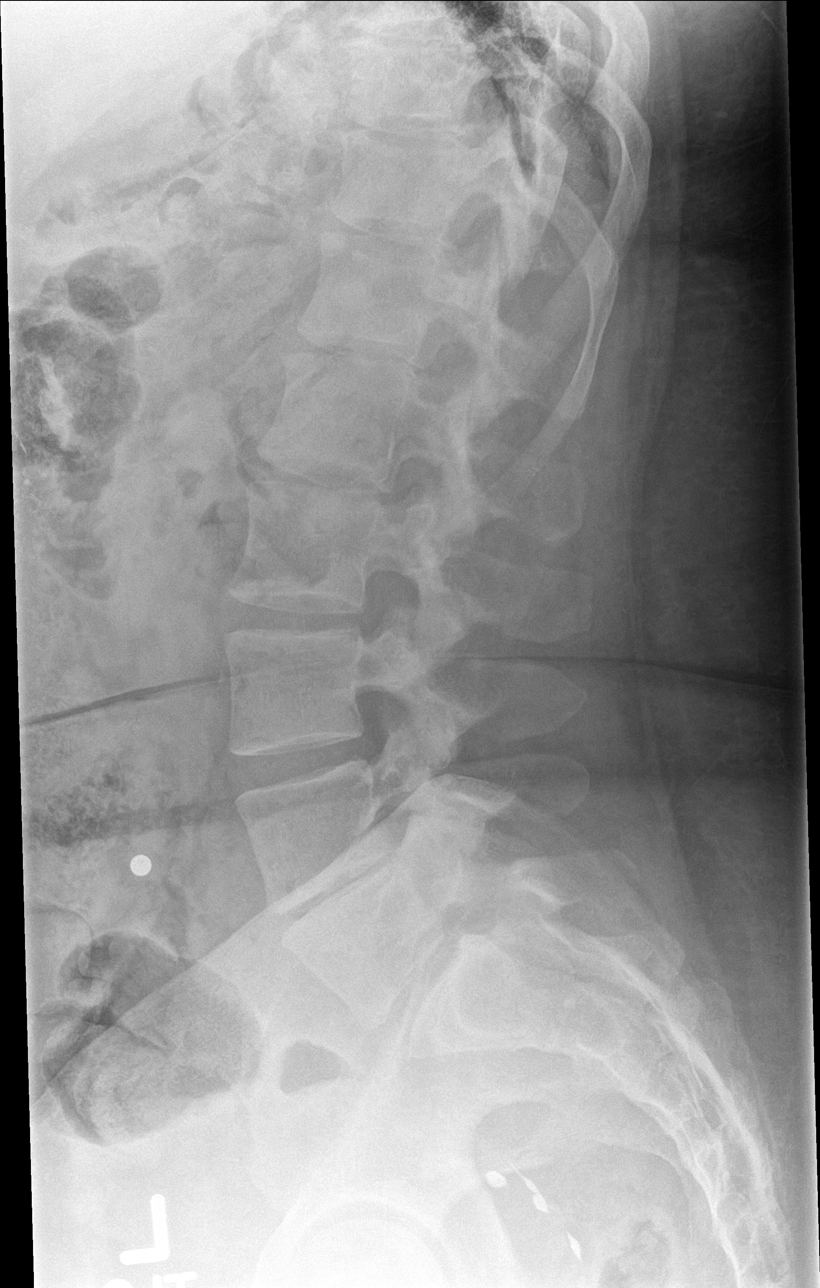

[l-spine spot]
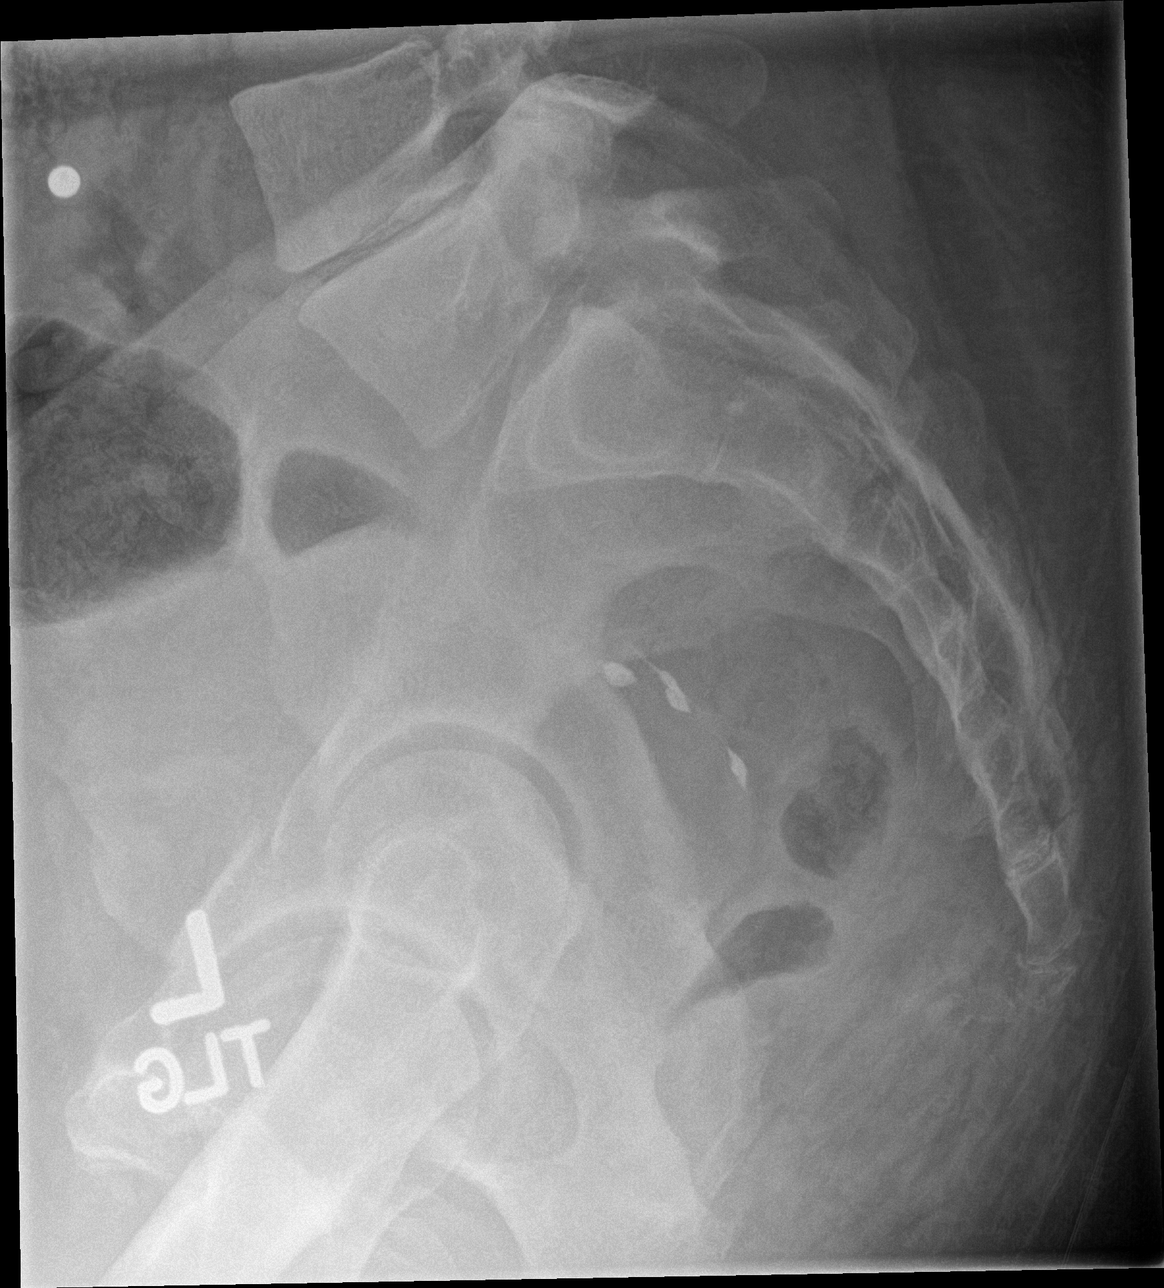

[5 of 5 positions shown; findings below may reference images not displayed]

FINDINGS: Stable lumbar spine alignment. Degenerative disc disease at L4-5 and
L5-S1 with disc space narrowing. Facets aligned. Preserved body
heights. No focal kyphosis or compression fracture deformity. No
definite pars defects. Normal SI joints. Nonobstructive bowel gas
pattern.
IMPRESSION: Stable mild lower lumbar degenerative changes. No acute finding by
plain radiography or significant interval change.

## 2017-09-18 IMAGING — DX DG TIBIA/FIBULA 2V*L*
2 series · 2 of 2 positions shown · non-contrast
Comparison: Left ankle 08/24/2015

CLINICAL DATA: Recent falls with bruising in the left tib/fib.

EXAM:
LEFT TIBIA AND FIBULA - 2 VIEW

[tibia ap]
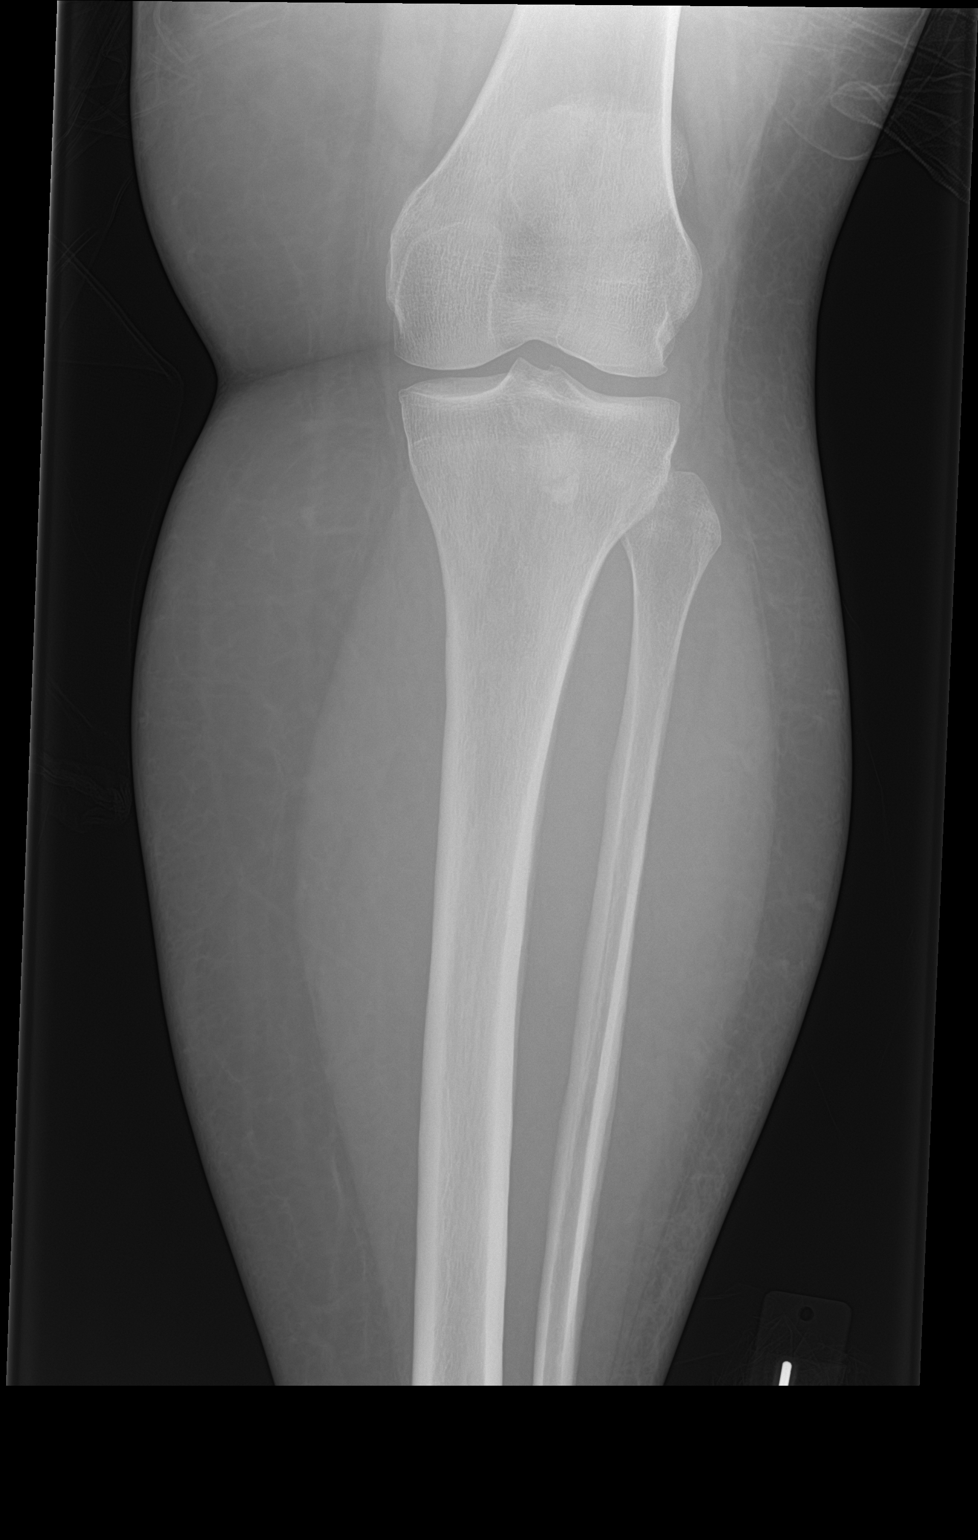

[tibia lat]
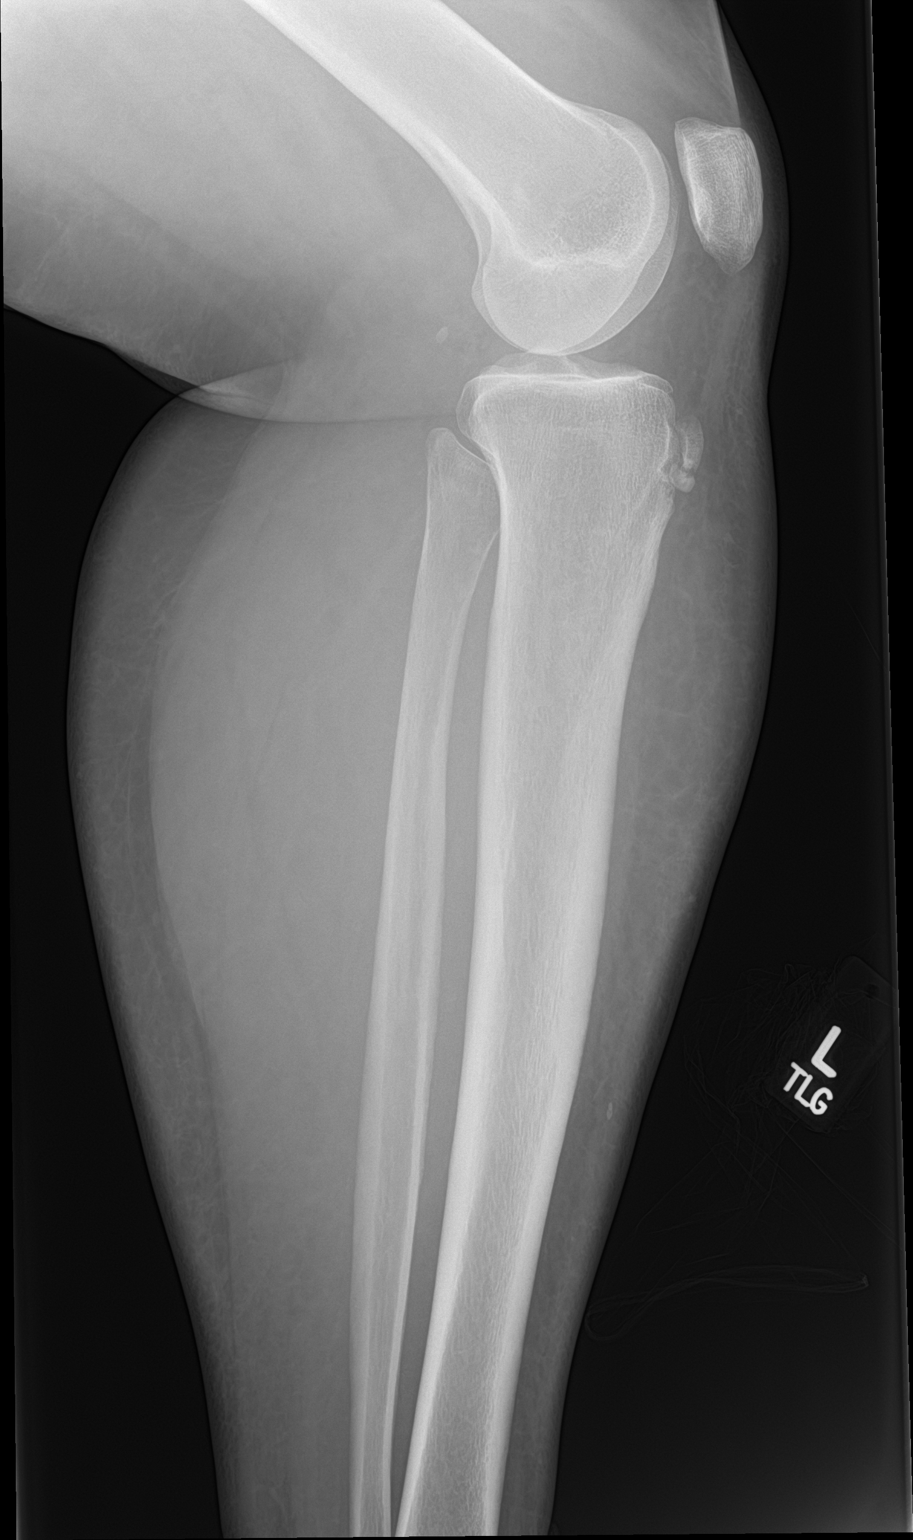

[2 of 2 positions shown; findings below may reference images not displayed]

FINDINGS: Negative for an acute fracture involving the left tibia or fibula.
Chronic irregularity involving the tibial tuberosity. The left knee
is located without a large joint effusion.
IMPRESSION: No acute abnormality.

## 2020-03-17 ENCOUNTER — Telehealth: Payer: Self-pay | Admitting: *Deleted

## 2020-03-17 ENCOUNTER — Other Ambulatory Visit: Payer: Self-pay

## 2020-03-17 ENCOUNTER — Telehealth: Payer: Self-pay | Admitting: Neurology

## 2020-03-17 ENCOUNTER — Ambulatory Visit: Payer: Medicare HMO | Admitting: Neurology

## 2020-03-17 ENCOUNTER — Encounter: Payer: Self-pay | Admitting: Neurology

## 2020-03-17 VITALS — BP 92/59 | HR 68 | Ht 66.0 in | Wt 133.6 lb

## 2020-03-17 DIAGNOSIS — R200 Anesthesia of skin: Secondary | ICD-10-CM

## 2020-03-17 DIAGNOSIS — E559 Vitamin D deficiency, unspecified: Secondary | ICD-10-CM

## 2020-03-17 DIAGNOSIS — G4710 Hypersomnia, unspecified: Secondary | ICD-10-CM

## 2020-03-17 DIAGNOSIS — E538 Deficiency of other specified B group vitamins: Secondary | ICD-10-CM

## 2020-03-17 DIAGNOSIS — Z79899 Other long term (current) drug therapy: Secondary | ICD-10-CM

## 2020-03-17 DIAGNOSIS — Z9884 Bariatric surgery status: Secondary | ICD-10-CM | POA: Diagnosis not present

## 2020-03-17 DIAGNOSIS — H532 Diplopia: Secondary | ICD-10-CM

## 2020-03-17 DIAGNOSIS — R269 Unspecified abnormalities of gait and mobility: Secondary | ICD-10-CM

## 2020-03-17 DIAGNOSIS — G35 Multiple sclerosis: Secondary | ICD-10-CM

## 2020-03-17 MED ORDER — MODAFINIL 200 MG PO TABS: 200.0000 mg | ORAL_TABLET | Freq: Every day | ORAL | 5 refills | Status: DC

## 2020-03-17 NOTE — Progress Notes (Signed)
[facility]  PATIENT: Beth [name] DOB: Aug 22, 1981  REFERRING DOCTOR OR PCP:  Georgette Shell, PA-C SOURCE: Patient, notes from primary care, notes from [facility] neurology, imaging and lab reports, MRI images personally reviewed.  _________________________________   HISTORICAL  CHIEF COMPLAINT:  Chief Complaint  Patient presents with  . Follow-up    RM 21 with mother, Fulton Mole. Paper referral from Ladora Daniel, PA-C at [facility] for MS. Dx in early 20's. Saw Erling Conte, MD for MS here. Located at [address]. [place], [place] [address]. Phone: [phone].   . Multiple Sclerosis    Pt reports shaking/jerking. 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 Having difficulty swallowing/speech issues. Balance issues. Having memory issues.     HISTORY OF PRESENT ILLNESS:  I had the pleasure of seeing your patient, Beth [name], at the MS center at [facility] for neurologic consultation regarding her multiple sclerosis.  She is a 39 year old woman who was diagnosed with MS in 2001.   At that time, at age 65, she had numbness and reduced dexterity in the hands causing reduced typing. Her right hand sometimes jerked and she had slurred speech.   An MRI was consistent with MS and she had IV steroids.   She had an ulcer develop.  She was started on Rebif.   Due to side effects she stopped.   She had a few exacerbations over the years. She had a lot going on with her sister  Committing suicide and she becoming an alcoholic.  For many years she did not see any neurologist . Around 2014, she started to have more difficulty with balance and gait .  She had a lumbar fracture after a fall in 2016.   Repeat imaging was performed and she was referred to Dr. Gaynelle Adu at [facility].   She was placed on Tecfidera but stopped due to hair loss and flushing.   She has not been on any medication a couple years.      Over the last couple months, she has hypersomnia x  days followed by reduced sleep x days.  When asleep, she is hard to arouse.   She also is more forgetful.   None of her medications changed.   She has had a lot of medication changes.    She is on methadone 120 mg daily for pain control.  She needs Linzess due to constipation.   She sees Counselor Maeola Harman [facility] --- they also do pain management).    She had the onset of diplopia about 2 months ago.  She feels it is only slightly better..   She has no recent MRI.   She feels cognitively more in a fog as well.  She is confused when she wakes up.  She notes that her balance is mildly off.  She does not need to use a cane or walker.  She notes some weakness when more tired in the legs, more on the right.  She has a h/o gastric bypass in 2002.    She lost a lot of weight and gained back quite a bit.  Over the last 2 years, she has lost her weight (on Topamax at the time)  but is steady at 130 pounds  She has chronic Hepatitis C and has an appointment to see GI soon.  She is currently on methadone 120 mg daily for pain but also had an opiate drug addiction in the past.  She  does have degenerative changes in her lumbar spine causing back and leg pain.  She had right hemilaminectomy at L4-L5 due to a disc herniation.  I personally reviewed the MRI of the brain dated 09/16/2015.  It shows T2/FLAIR hyperintense foci in the cerebellar hemisphere, left medulla, and in the periventricular and juxtacortical white matter.  None of the foci enhanced.  MRI of the cervical spine showed T2 hyperintense foci to the right at C1-C2, posteriorly at C2, small focus to the left of C5 and anteriorly to the right at C5-C6.  REVIEW OF SYSTEMS: Constitutional: No fevers, chills, sweats, or change in appetite.  She has fatigue. Eyes: No visual changes, double vision, eye pain Ear, nose and throat: No hearing loss, ear pain, nasal congestion, sore throat Cardiovascular: No chest pain,  palpitations Respiratory: No shortness of breath at rest or with exertion.   No wheezes GastrointestinaI: No nausea, vomiting, diarrhea, abdominal pain, fecal incontinence.  She has asymptomatic chronic hepatitis C Genitourinary: No dysuria, urinary retention or frequency.  No nocturia. Musculoskeletal:She reports lower back and leg pain. Integumentary: No rash, pruritus, skin lesions Neurological: as above Psychiatric: No depression at this time.  No anxiety Endocrine: No palpitations, diaphoresis, change in appetite, change in weigh or increased thirst Hematologic/Lymphatic: No anemia, purpura, petechiae. Allergic/Immunologic: No itchy/runny eyes, nasal congestion, recent allergic reactions, rashes  ALLERGIES: Allergies  Allergen Reactions  . Cymbalta [Duloxetine Hcl] Other (See Comments)    Made body jerk and twitch, spasms  . Dilaudid [Hydromorphone Hcl] Other (See Comments)    Anger, aggression   . Naltrexone Swelling  . Nsaids Nausea And Vomiting  . Zolpidem Other (See Comments)    Sleep walking  . Abilify [Aripiprazole] Itching    Clenching of teeth, Grinds teeth  . Meperidine     Mental Status Changes, Agitation, swearing  . Prednisone Other (See Comments)    Steroids in general    HOME MEDICATIONS:  Current Outpatient Medications:  .  levonorgestrel-ethinyl estradiol (ALESSE) 0.1-20 MG-MCG tablet, Take 1 tablet by mouth daily., Disp: , Rfl:  .  lubiprostone (AMITIZA) 24 MCG capsule, Take 24 mcg by mouth 2 (two) times daily with a meal., Disp: , Rfl:  .  METHADONE HCL PO, Take 120 mg by mouth daily. , Disp: , Rfl:  .  sertraline (ZOLOFT) 50 MG tablet, Take 50 mg by mouth daily., Disp: , Rfl:  .  SUMAtriptan (IMITREX) 50 MG tablet, Take 50 mg by mouth every 2 (two) hours as needed for migraine. May repeat in 2 hours if headache persists or recurs., Disp: , Rfl:   PAST MEDICAL HISTORY: Past Medical History:  Diagnosis Date  . Acid reflux   . Anxiety   . Asthma    . Chronic diarrhea    self reported  . Chronic nausea    self reported  . Chronic pain    self reported chronic pain, back pain, fibromyalgia  . Delirium tremens (HCC)   . Depression   . ETOH abuse   . Gastric ulcer   . History of blood transfusion   . Insomnia   . Migraine   . Multiple sclerosis (HCC) 2005   diagnosed in [place], failed steroids, no other medication  . Polysubstance abuse (HCC)   . Substance abuse (HCC)     PAST SURGICAL HISTORY: Past Surgical History:  Procedure Laterality Date  . DIAGNOSTIC LAPAROSCOPY  2003   repair of perforation at [facility] anastamosis   . GASTRIC BYPASS    . LAPAROTOMY  N/A 08/18/2014   Procedure: EXPLORATORY LAPAROTOMY, Patch graft of gastric perforation;  Surgeon: Abigail Miyamoto, MD;  Location: [facility] OR;  Service: General;  Laterality: N/A;  . LUMBAR LAMINECTOMY/DECOMPRESSION MICRODISCECTOMY Right 04/14/2014   Procedure: LUMBAR LAMINECTOMY/DECOMPRESSION MICRODISCECTOMY LEVEL L4-5;  Surgeon: Maeola Harman, MD;  Location: [facility] ORS;  Service: Neurosurgery;  Laterality: Right;  LUMBAR LAMINECTOMY/DECOMPRESSION MICRODISCECTOMY LEVEL L4-5    FAMILY HISTORY: Family History  Problem Relation Age of Onset  . Hypertension Paternal Grandfather   . Colon cancer Paternal Grandfather 3  . Asthma Paternal Grandmother   . Hypertension Paternal Grandmother   . Breast cancer Paternal Grandmother   . Cancer Paternal Grandmother        lung   . Diabetes Maternal Grandmother   . Diabetes Maternal Grandfather   . Hypertension Father   . Alcohol abuse Father   . Urinary tract infection Mother   . Stroke Mother 12       3 strokes     SOCIAL HISTORY:  Social History   Socioeconomic History  . Marital status: Single    Spouse name: Alice  . Number of children: 0  . Years of education: 54  . Highest education level: Not on file  Occupational History  . Occupation: disability  Tobacco Use  . Smoking status: Current Some Day Smoker     Packs/day: 0.20    Types: Cigarettes  . Smokeless tobacco: Never Used  Substance and Sexual Activity  . Alcohol use: No    Alcohol/week: 0.0 standard drinks    Comment: patient states she does not drink any more.  . Drug use: No    Types: Marijuana, Cocaine, Methamphetamines, Oxycodone, Heroin    Comment: heroin  . Sexual activity: Yes    Birth control/protection: None, Condom  Other Topics Concern  . Not on file  Social History Narrative   Lives with mother, Fulton Mole   Caffeine use: soda (3-4 per day)   No coffee   Social Determinants of Health   Financial Resource Strain:   . Difficulty of Paying Living Expenses:   Food Insecurity:   . Worried About Programme researcher, broadcasting/film/video in the Last Year:   . Barista in the Last Year:   Transportation Needs:   . Freight forwarder (Medical):   Marland Kitchen Lack of Transportation (Non-Medical):   Physical Activity:   . Days of Exercise per Week:   . Minutes of Exercise per Session:   Stress:   . Feeling of Stress :   Social Connections:   . Frequency of Communication with Friends and Family:   . Frequency of Social Gatherings with Friends and Family:   . Attends Religious Services:   . Active Member of Clubs or Organizations:   . Attends Banker Meetings:   Marland Kitchen Marital Status:   Intimate Partner Violence:   . Fear of Current or Ex-Partner:   . Emotionally Abused:   Marland Kitchen Physically Abused:   . Sexually Abused:      PHYSICAL EXAM  Vitals:   03/17/20 0852  BP: (!) 92/59  Pulse: 68  Weight: 133 lb 9.6 oz (60.6 kg)  Height: 5\' 6"  (1.676 m)    Body mass index is 21.56 kg/m.   Hearing Screening   125Hz  250Hz  500Hz  1000Hz  2000Hz  3000Hz  4000Hz  6000Hz  8000Hz   Right ear:           Left ear:             Visual Acuity Screening  Right eye Left eye Both eyes  Without correction: 20/40 20/30 20/40   With correction:        General: The patient is well-developed and well-nourished and in no acute distress  HEENT:  Head  is Dana/AT.  Sclera are anicteric.  Funduscopic exam shows normal optic discs and retinal vessels.  Neck: No carotid bruits are noted.  The neck is nontender.  Cardiovascular: The heart has a regular rate and rhythm with a normal S1 and S2. There were no murmurs, gallops or rubs.    Skin: Extremities are without rash or  edema.  Musculoskeletal:  Back is nontender  Neurologic Exam  Mental status: The patient is alert and oriented x 3 at the time of the examination. The patient has apparent normal recent and remote memory, with an apparently normal attention span and concentration ability.   Speech is normal.  Cranial nerves: Extraocular movements are full.  However, she feels his vision is slightly fuzzy when she looks to the right color vision was reduced on the left.  Pupils are equal, round, and reactive to light and accomodation.  Visual fields are full.  Facial symmetry is present. There is good facial sensation to soft touch bilaterally.Facial strength is normal.  Trapezius and sternocleidomastoid strength is normal. No dysarthria is noted.  The tongue is midline, and the patient has symmetric elevation of the soft palate. No obvious hearing deficits are noted.  Motor:  Muscle bulk is normal.   Tone is normal. Strength is  5 / 5 in all 4 extremities.   Sensory: Sensory testing is intact to pinprick, soft touch and vibration sensation in all 4 extremities.  Coordination: Cerebellar testing reveals good finger-nose-finger and heel-to-shin bilaterally.  Gait and station: Station is normal.   Gait is mildly wide.  She has difficulty doing a tandem gait.. Romberg is negative.   Reflexes: Deep tendon reflexes are symmetric and normal bilaterally.   Plantar responses are flexor.    DIAGNOSTIC DATA (LABS, IMAGING, TESTING) - I reviewed patient records, labs, notes, testing and imaging myself where available.  Lab Results  Component Value Date   WBC 6.5 08/19/2017   HGB 14.4 08/19/2017    HCT 42.6 08/19/2017   MCV 81.1 08/19/2017   PLT 411 (H) 08/19/2017      Component Value Date/Time   NA 141 08/19/2017 1219   K 3.8 08/19/2017 1219   CL 106 08/19/2017 1219   CO2 26 08/19/2017 1219   GLUCOSE 112 (H) 08/19/2017 1219   BUN 6 08/19/2017 1219   CREATININE 0.73 08/19/2017 1219   CALCIUM 8.5 (L) 08/19/2017 1219   PROT 5.6 (L) 08/19/2017 0351   ALBUMIN 2.3 (L) 08/19/2017 0351   AST 45 (H) 08/19/2017 0351   ALT 99 (H) 08/19/2017 0351   ALKPHOS 228 (H) 08/19/2017 0351   BILITOT 0.5 08/19/2017 0351   GFRNONAA >60 08/19/2017 1219   GFRAA >60 08/19/2017 1219   Lab Results  Component Value Date   CHOL 203 (H) 05/24/2015   HDL 61 05/24/2015   LDLCALC 120 (H) 05/24/2015   TRIG 112 05/24/2015   CHOLHDL 3.3 05/24/2015   Lab Results  Component Value Date   HGBA1C 5.2 05/24/2015   No results found for: VITAMINB12 Lab Results  Component Value Date   TSH 1.649 01/13/2015       ASSESSMENT AND PLAN  Multiple sclerosis ([place]) - Plan: CBC with Differential/Platelet, Comprehensive metabolic panel, MR BRAIN W WO CONTRAST, MR CERVICAL SPINE W WO CONTRAST  Hypersomnia  History of gastric bypass - Plan: CBC with Differential/Platelet, Comprehensive metabolic panel, Vitamin B12, Copper, serum  Numbness - Plan: Vitamin B12, Copper, serum  Gait disturbance  High risk medication use - Plan: Copper, serum  Vitamin D deficiency - Plan: VITAMIN D 25 Hydroxy (Vit-D Deficiency, Fractures)  Diplopia  Vitamin B12 deficiency   In summary, Ms. Teng is a 39 year old woman with multiple sclerosis that was diagnosed 19 to 20 years ago.  She has already been on disease modifying therapies for a total of a couple years during the past 2 decades.  Additionally, she has a history of gastric bypass, lumbar disc herniation status post hemilaminectomy and substance abuse.  Currently, she is experiencing diplopia, numbness and gait disturbance.  We discussed the importance of getting back  on a disease modifying therapy and we discussed some options.  She felt she tolerated the Tecfidera well enough to go back on.  Because she has chronic hepatitis C, I would be reluctant to put her on one of the DMT's that has more immunosuppressive activity.  Additionally, because of her gastric bypass we will check vitamin B12 and copper to make sure that a deficiency is not contributing to her numbness.  We will check an MRI of the brain and cervical spine to determine if she is having more recent activity.  I will compare with her previous MRI.  She will return to see me in 3 to 4 months or sooner if there are new or worsening neurologic symptoms.  Thank you for asking me to see Ms. Buskirk.  Please let me know if I can be of further assistance with her or other patients in the future.   Blossom Crume A. Epimenio Foot, MD, [facility] 03/17/2020, 8:57 AM Certified in Neurology, Clinical Neurophysiology, Sleep Medicine and Neuroimaging  [facility] [address] [place], [place] [address] [phone]

## 2020-03-17 NOTE — Telephone Encounter (Signed)
Submitted PA modafinil on CMM. KeyHollace Hayward - PA Case ID: [id number] - Rx #: [id number]. Waiting on determination from [facility].

## 2020-03-17 NOTE — Telephone Encounter (Signed)
Faxed completed/signed Tecfidera start form to [employer] at [phone]. Received fax confirmation.  

## 2020-03-17 NOTE — Telephone Encounter (Signed)
[employer] medicare order sent to GI. They will obtain the auth and reach out to the patient to schedule.  °

## 2020-03-21 NOTE — Telephone Encounter (Signed)
Received fax from [employer] that they received Tecfidera start form and they are processing.

## 2020-03-21 NOTE — Telephone Encounter (Signed)
Rep with [facility]  CB#[phone] ext [phone] called to advise a PA is needed for the pts tecfidera provided phone number to contact for PA #[phone]

## 2020-03-21 NOTE — Telephone Encounter (Addendum)
Submitted PA Tecfidera starter pack on CMM. [id number]. Received instant approval via [employer] effective 10/30/2019 - 10/28/2020.  I called [facility] back and notified them of approval. Spoke with Charla. She confirmed they are receiving paid claim now.

## 2020-03-22 ENCOUNTER — Other Ambulatory Visit: Payer: Self-pay | Admitting: *Deleted

## 2020-03-22 LAB — COMPREHENSIVE METABOLIC PANEL
ALT: 9 IU/L (ref 0–32)
AST: 21 IU/L (ref 0–40)
Albumin/Globulin Ratio: 1.4 (ref 1.2–2.2)
Albumin: 4.3 g/dL (ref 3.8–4.8)
Alkaline Phosphatase: 99 IU/L (ref 48–121)
BUN/Creatinine Ratio: 10 (ref 9–23)
BUN: 8 mg/dL (ref 6–20)
Bilirubin Total: 0.5 mg/dL (ref 0.0–1.2)
CO2: 27 mmol/L (ref 20–29)
Calcium: 9.9 mg/dL (ref 8.7–10.2)
Chloride: 102 mmol/L (ref 96–106)
Creatinine, Ser: 0.78 mg/dL (ref 0.57–1.00)
GFR calc Af Amer: 112 mL/min/{1.73_m2} (ref >59)
GFR calc non Af Amer: 97 mL/min/{1.73_m2} (ref >59)
Globulin, Total: 3.1 g/dL (ref 1.5–4.5)
Glucose: 57 mg/dL — ABNORMAL LOW (ref 65–99)
Potassium: 5 mmol/L (ref 3.5–5.2)
Sodium: 145 mmol/L — ABNORMAL HIGH (ref 134–144)
Total Protein: 7.4 g/dL (ref 6.0–8.5)

## 2020-03-22 LAB — CBC WITH DIFFERENTIAL/PLATELET
Basophils Absolute: 0 10*3/uL (ref 0.0–0.2)
Basos: 1 %
EOS (ABSOLUTE): 0.2 10*3/uL (ref 0.0–0.4)
Eos: 3 %
Hematocrit: 37.2 % (ref 34.0–46.6)
Hemoglobin: 11.7 g/dL (ref 11.1–15.9)
Immature Grans (Abs): 0 10*3/uL (ref 0.0–0.1)
Immature Granulocytes: 0 %
Lymphocytes Absolute: 2.2 10*3/uL (ref 0.7–3.1)
Lymphs: 44 %
MCH: 24.7 pg — ABNORMAL LOW (ref 26.6–33.0)
MCHC: 31.5 g/dL (ref 31.5–35.7)
MCV: 79 fL (ref 79–97)
Monocytes Absolute: 0.8 10*3/uL (ref 0.1–0.9)
Monocytes: 16 %
Neutrophils Absolute: 1.8 10*3/uL (ref 1.4–7.0)
Neutrophils: 36 %
Platelets: 393 10*3/uL (ref 150–450)
RBC: 4.73 x10E6/uL (ref 3.77–5.28)
RDW: 13.8 % (ref 11.7–15.4)
WBC: 5 10*3/uL (ref 3.4–10.8)

## 2020-03-22 LAB — VITAMIN B12: Vitamin B-12: 349 pg/mL (ref 232–1245)

## 2020-03-22 LAB — VITAMIN D 25 HYDROXY (VIT D DEFICIENCY, FRACTURES): Vit D, 25-Hydroxy: 34.6 ng/mL (ref 30.0–100.0)

## 2020-03-22 LAB — COPPER, SERUM: Copper: 130 ug/dL (ref 80–158)

## 2020-03-22 MED ORDER — MODAFINIL 200 MG PO TABS: 200.0000 mg | ORAL_TABLET | Freq: Every day | ORAL | 5 refills | Status: DC

## 2020-03-22 NOTE — Telephone Encounter (Signed)
Called mother back. Relayed below info. [facility] will fill rx [facility] and will use [facility]. I sent request to MD. She states her daughter has slept all weak d/t fatigue. They asked about DMT for her MS. Thought she was to go on Vumerity. Advised per Dr. Epimenio Foot that we are getting her on Tecfidera and if she runs into any insurance/cost issues we can try to get her on Vumerity at that point. If she has any GI upset on Tecfidera we can also try and switch her to Vumerity at that point. She will reach back out to [employer] about getting phone number to specialty pharmacy that will be filling Tecfidera. She will reach back out if she runs into any issues.

## 2020-03-22 NOTE — Telephone Encounter (Signed)
Pt's mother has returned the call to [facility] please call.

## 2020-03-22 NOTE — Telephone Encounter (Addendum)
Received denial letter from [place]. Called, LVM for pt to call office back.   We can send modafinil to another pharmacy like [facility] and she can use goodrx coupon to fill prescription. These pharmacies are cheaper than [facility] when using goodrx coupons and that is why we recommend them. Wanting to know where should would like [place] to send the prescription

## 2020-04-12 ENCOUNTER — Telehealth: Payer: Self-pay | Admitting: Neurology

## 2020-04-12 MED ORDER — ALPRAZOLAM 1 MG PO TABS: ORAL_TABLET | ORAL | 0 refills | Status: DC

## 2020-04-12 NOTE — Addendum Note (Signed)
Addended by: Ann Maki on: 04/12/2020 04:20 PM   Modules accepted: Orders

## 2020-04-12 NOTE — Addendum Note (Signed)
Addended by: Asa Lente on: 04/12/2020 05:32 PM   Modules accepted: Orders

## 2020-04-12 NOTE — Telephone Encounter (Signed)
Pt is asking for medication to be called in for pt to relax her for her upcoming MRI.  [facility] [phone]

## 2020-04-26 ENCOUNTER — Ambulatory Visit: Admission: RE | Admit: 2020-04-26 | Payer: Medicare HMO | Source: Ambulatory Visit

## 2020-04-26 ENCOUNTER — Ambulatory Visit
Admission: RE | Admit: 2020-04-26 | Discharge: 2020-04-26 | Disposition: A | Discharge status: Home / Self Care | Payer: Medicare HMO | Source: Ambulatory Visit | Attending: Neurology | Admitting: Neurology

## 2020-04-26 DIAGNOSIS — G35 Multiple sclerosis: Secondary | ICD-10-CM

## 2020-04-27 NOTE — Telephone Encounter (Signed)
Received fax from [employer] that [employer] [facility] has confirmed that Tecfidera has been shipped to the pt. Phone: [phone]. Fax: [phone].

## 2020-06-02 ENCOUNTER — Ambulatory Visit
Admission: RE | Admit: 2020-06-02 | Discharge: 2020-06-02 | Disposition: A | Discharge status: Home / Self Care | Payer: Medicare HMO | Source: Ambulatory Visit | Attending: Neurology | Admitting: Neurology

## 2020-06-02 ENCOUNTER — Ambulatory Visit
Admission: RE | Admit: 2020-06-02 | Discharge: 2020-06-02 | Disposition: A | Discharge status: Home / Self Care | Payer: Medicaid Other | Source: Ambulatory Visit | Attending: Neurology | Admitting: Neurology

## 2020-06-02 ENCOUNTER — Other Ambulatory Visit: Payer: Self-pay

## 2020-06-02 DIAGNOSIS — G35 Multiple sclerosis: Secondary | ICD-10-CM | POA: Diagnosis not present

## 2020-06-02 MED ORDER — GADOBENATE DIMEGLUMINE 529 MG/ML IV SOLN
12.0000 mL | Freq: Once | INTRAVENOUS | Status: AC | PRN
  Administered 2020-06-02: 12 mL via INTRAVENOUS

## 2020-07-18 ENCOUNTER — Encounter: Payer: Self-pay | Admitting: Neurology

## 2020-07-18 ENCOUNTER — Ambulatory Visit (INDEPENDENT_AMBULATORY_CARE_PROVIDER_SITE_OTHER): Payer: Medicare HMO | Admitting: Neurology

## 2020-07-18 VITALS — BP 91/56 | HR 60 | Ht 66.0 in | Wt 132.0 lb

## 2020-07-18 DIAGNOSIS — F1129 Opioid dependence with unspecified opioid-induced disorder: Secondary | ICD-10-CM

## 2020-07-18 DIAGNOSIS — Z79899 Other long term (current) drug therapy: Secondary | ICD-10-CM

## 2020-07-18 DIAGNOSIS — G35 Multiple sclerosis: Secondary | ICD-10-CM | POA: Diagnosis not present

## 2020-07-18 DIAGNOSIS — R4586 Emotional lability: Secondary | ICD-10-CM | POA: Insufficient documentation

## 2020-07-18 DIAGNOSIS — Z9884 Bariatric surgery status: Secondary | ICD-10-CM

## 2020-07-18 DIAGNOSIS — R269 Unspecified abnormalities of gait and mobility: Secondary | ICD-10-CM

## 2020-07-18 MED ORDER — BUSPIRONE HCL 15 MG PO TABS: 15.0000 mg | ORAL_TABLET | Freq: Two times a day (BID) | ORAL | 5 refills | Status: DC

## 2020-07-18 NOTE — Progress Notes (Signed)
[facility]  PATIENT: CHENAE [name] DOB: 1980/11/18  REFERRING DOCTOR OR PCP:  Roe Coombs, PA-C SOURCE: Patient, notes from primary care, notes from [facility] neurology, imaging and lab reports, MRI images personally reviewed.  _________________________________   HISTORICAL  CHIEF COMPLAINT:  Chief Complaint  Patient presents with  . Follow-up    RM 12 with mother. Last seen 03/17/2020. Had a skin infection last month (given antibiotics). Cleared up now   . Multiple Sclerosis    Never started Tecfidera. Worried about covid-19/medication affecting her immune system. She has starter kit on hand. She also changed insurance from [place] to [employer].   . Fatigue    Takes modafinil    HISTORY OF PRESENT ILLNESS:   Beth [name] is a 39 y.o. woman with relapsing remitting multiple sclerosis.  Update 07/18/20: At the last visit, we tried to get her back on Tecfidera but she never started.   She is concerned about immunosuppression and insurance issues.  She reports no new symptoms.  She continues to have gait and balance but fairly good strength and sensation.  She notes occasional diplopia but it is better than it was at the last visit.  She notes reduced focus and attention and sometimes feels in a fog.  She has been diagnosed with bipolar disease and personality disorder and anxiety.  Most recently, Seroquel was poorly tolerated with sleepwalking.    Ambien caused sleepwalking.  Risperdal and Abilify caused her to have sleepwalking.  Lamotrigine was tried several times and she had tolerability issues.   She was once on buspirone.    Zoloft made her more manic.  Headache  She had a brain and cervical spine MRI 06/02/20.   MRI brain showed Multiple T2/FLAIR hyperintense foci in the hemispheres, cerebellum and medulla in a pattern and configuration consistent with chronic demyelinating plaque associated with multiple sclerosis.  None of the foci appear to be acute and they do  not enhance.  Compared to the MRI dated 09/16/2015, there are no new lesions.     MRI cervical spine 06/02/2020 showed several T2 hyperintense foci within the spinal cord as detailed above.  None of these enhance after contrast and they were all present on the previous MRI from 2016.  These are consistent with stable chronic demyelinating plaque associated with multiple sclerosis.  Mild disc degenerative changes at C4-C5 and C5-C6 that do not lead to spinal stenosis or nerve root compression.  The degenerative changes have progressed compared to the previous MR.  She had Covid in March/April, and had her Covid vaccination in April/May.     She reports that the modafinil did not help and made her methadone metabolize faster so she stopped.    She sleeps poorly at night and also has hypersomnia at times.   She feels she gets no sleep some nights and generally only a few hours the past week.   However, most weeks, she is sleeping excessively..   She feels her brain has too many thoughts many nights.    She was prescribed Seroquel recently which helps her fall asleep but not stay asleep and she felt half awake/ half asleep some nights.   She stopped the Seroquel.       She had a bacterial infection in her leg that is doing better.      MS History: She was diagnosed with MS in 2001.   At that time, at age 39, she had numbness and reduced dexterity in the hands  causing reduced typing. Her right hand sometimes jerked and she had slurred speech.   An MRI was consistent with MS and she had IV steroids.   She had an ulcer develop.  She was started on Rebif.   Due to side effects she stopped.   She had a few exacerbations over the years. She had a lot going on with her sister committing suicide and she becoming an alcoholic.  For many years she did not see any neurologist . Around 2014, she started to have more difficulty with balance and gait .  She had a lumbar fracture after a fall in 2016.   Repeat imaging was  performed and she was referred to Dr. Gaynelle Adu at [facility].   She was placed on Tecfidera but stopped due to hair loss and flushing.   She has not been on any medication a couple years.   She developed diplopia spring 2021    Other pertinent History: She has a h/o gastric bypass in 2002.    She lost a lot of weight and gained back quite a bit.  Over the last 2 years, she has lost her weight (on Topamax at the time)  but is steady at 130 pounds  She has chronic Hepatitis C and has an appointment to see GI soon.  She is currently on methadone 120 mg daily for pain but also had an opiate drug addiction in the past.  She does have degenerative changes in her lumbar spine causing back and leg pain.  She had right hemilaminectomy at L4-L5 due to a disc herniation.  IMAGING REVIEW MRI of the brain dated 09/16/2015.  It shows T2/FLAIR hyperintense foci in the cerebellar hemisphere, left medulla, and in the periventricular and juxtacortical white matter.  None of the foci enhanced.  MRI of the cervical spine showed T2 hyperintense foci to the right at C1-C2, posteriorly at C2, small focus to the left of C5 and anteriorly to the right at C5-C6.  MRI brain 06/02/2020 showed Multiple T2/FLAIR hyperintense foci in the hemispheres, cerebellum and medulla in a pattern and configuration consistent with chronic demyelinating plaque associated with multiple sclerosis.  None of the foci appear to be acute and they do not enhance.  Compared to the MRI dated 09/16/2015, there are no new lesions.       MRI cervical spine 06/02/2020 showed several T2 hyperintense foci within the spinal cord as detailed above.  None of these enhance after contrast and they were all present on the previous MRI from 2016.  These are consistent with stable chronic demyelinating plaque associated with multiple sclerosis.  Mild disc degenerative changes at C4-C5 and C5-C6 that do not lead to spinal stenosis or nerve root compression.  The  degenerative changes have progressed compared to the previous MR.  REVIEW OF SYSTEMS: Constitutional: No fevers, chills, sweats, or change in appetite.  She has fatigue. Eyes: No visual changes, double vision, eye pain Ear, nose and throat: No hearing loss, ear pain, nasal congestion, sore throat Cardiovascular: No chest pain, palpitations Respiratory: No shortness of breath at rest or with exertion.   No wheezes GastrointestinaI: No nausea, vomiting, diarrhea, abdominal pain, fecal incontinence.  She has asymptomatic chronic hepatitis C Genitourinary: No dysuria, urinary retention or frequency.  No nocturia. Musculoskeletal:She reports lower back and leg pain. Integumentary: No rash, pruritus, skin lesions Neurological: as above Psychiatric: No depression at this time.  No anxiety Endocrine: No palpitations, diaphoresis, change in appetite, change in weigh or  increased thirst Hematologic/Lymphatic: No anemia, purpura, petechiae. Allergic/Immunologic: No itchy/runny eyes, nasal congestion, recent allergic reactions, rashes  ALLERGIES: Allergies  Allergen Reactions  . Cymbalta [Duloxetine Hcl] Other (See Comments)    Made body jerk and twitch, spasms  . Dilaudid [Hydromorphone Hcl] Other (See Comments)    Anger, aggression   . Naltrexone Swelling  . Nsaids Nausea And Vomiting  . Zolpidem Other (See Comments)    Sleep walking  . Abilify [Aripiprazole] Itching    Clenching of teeth, Grinds teeth  . Meperidine     Mental Status Changes, Agitation, swearing  . Prednisone Other (See Comments)    Steroids in general    HOME MEDICATIONS:  Current Outpatient Medications:  .  ALPRAZolam (XANAX) 1 MG tablet, 1-2 tabs 30 mins before MRI and 1 extra at the time of study if needed., Disp: 3 tablet, Rfl: 0 .  cyclobenzaprine (FLEXERIL) 10 MG tablet, Take 10 mg by mouth 3 (three) times daily as needed for muscle spasms., Disp: , Rfl:  .  Dimethyl Fumarate (TECFIDERA) 120 & 240 MG MISC,  Take by mouth., Disp: , Rfl:  .  levonorgestrel-ethinyl estradiol (ALESSE) 0.1-20 MG-MCG tablet, Take 1 tablet by mouth daily., Disp: , Rfl:  .  linaclotide (LINZESS) 145 MCG CAPS capsule, Take 145 mcg by mouth daily before breakfast., Disp: , Rfl:  .  METHADONE HCL PO, Take 120 mg by mouth daily. , Disp: , Rfl:  .  modafinil (PROVIGIL) 200 MG tablet, Take 1 tablet (200 mg total) by mouth daily., Disp: 30 tablet, Rfl: 5 .  omeprazole (PRILOSEC) 20 MG capsule, Take 20 mg by mouth daily., Disp: , Rfl:  .  ondansetron (ZOFRAN) 4 MG tablet, Take 4 mg by mouth every 4 (four) hours as needed for nausea or vomiting., Disp: , Rfl:  .  SUMAtriptan (IMITREX) 50 MG tablet, Take 50 mg by mouth every 2 (two) hours as needed for migraine. May repeat in 2 hours if headache persists or recurs., Disp: , Rfl:  .  valACYclovir (VALTREX) 500 MG tablet, Take 500 mg by mouth daily., Disp: , Rfl:  .  busPIRone (BUSPAR) 15 MG tablet, Take 1 tablet (15 mg total) by mouth 2 (two) times daily., Disp: 60 tablet, Rfl: 5  PAST MEDICAL HISTORY: Past Medical History:  Diagnosis Date  . Acid reflux   . Anxiety   . Asthma   . Chronic diarrhea    self reported  . Chronic nausea    self reported  . Chronic pain    self reported chronic pain, back pain, fibromyalgia  . Delirium tremens (HCC)   . Depression   . ETOH abuse   . Gastric ulcer   . History of blood transfusion   . Insomnia   . Migraine   . Multiple sclerosis (HCC) 2005   diagnosed in [place], failed steroids, no other medication  . Polysubstance abuse (HCC)   . Substance abuse (HCC)     PAST SURGICAL HISTORY: Past Surgical History:  Procedure Laterality Date  . DIAGNOSTIC LAPAROSCOPY  2003   repair of perforation at GJ anastamosis   . GASTRIC BYPASS    . LAPAROTOMY N/A 08/18/2014   Procedure: EXPLORATORY LAPAROTOMY, Patch graft of gastric perforation;  Surgeon: Abigail Miyamoto, MD;  Location: [facility] OR;  Service: General;  Laterality: N/A;  . LUMBAR  LAMINECTOMY/DECOMPRESSION MICRODISCECTOMY Right 04/14/2014   Procedure: LUMBAR LAMINECTOMY/DECOMPRESSION MICRODISCECTOMY LEVEL L4-5;  Surgeon: Maeola Harman, MD;  Location: [facility] ORS;  Service: Neurosurgery;  Laterality: Right;  LUMBAR LAMINECTOMY/DECOMPRESSION MICRODISCECTOMY LEVEL L4-5    FAMILY HISTORY: Family History  Problem Relation Age of Onset  . Hypertension Paternal Grandfather   . Colon cancer Paternal Grandfather 21  . Asthma Paternal Grandmother   . Hypertension Paternal Grandmother   . Breast cancer Paternal Grandmother   . Cancer Paternal Grandmother        lung   . Diabetes Maternal Grandmother   . Diabetes Maternal Grandfather   . Hypertension Father   . Alcohol abuse Father   . Urinary tract infection Mother   . Stroke Mother 56       3 strokes     SOCIAL HISTORY:  Social History   Socioeconomic History  . Marital status: Single    Spouse name: Alice  . Number of children: 0  . Years of education: 16  . Highest education level: Not on file  Occupational History  . Occupation: disability  Tobacco Use  . Smoking status: Current Some Day Smoker    Packs/day: 0.20    Types: Cigarettes  . Smokeless tobacco: Never Used  Vaping Use  . Vaping Use: Never used  Substance and Sexual Activity  . Alcohol use: No    Alcohol/week: 0.0 standard drinks    Comment: patient states she does not drink any more.  . Drug use: No    Types: Marijuana, Cocaine, Methamphetamines, Oxycodone, Heroin    Comment: heroin  . Sexual activity: Yes    Birth control/protection: None, Condom  Other Topics Concern  . Not on file  Social History Narrative   Lives with mother, Fulton Mole   Caffeine use: soda (3-4 per day)   No coffee   Social Determinants of Health   Financial Resource Strain:   . Difficulty of Paying Living Expenses: Not on file  Food Insecurity:   . Worried About Programme researcher, broadcasting/film/video in the Last Year: Not on file  . Ran Out of Food in the Last Year: Not on file   Transportation Needs:   . Lack of Transportation (Medical): Not on file  . Lack of Transportation (Non-Medical): Not on file  Physical Activity:   . Days of Exercise per Week: Not on file  . Minutes of Exercise per Session: Not on file  Stress:   . Feeling of Stress : Not on file  Social Connections:   . Frequency of Communication with Friends and Family: Not on file  . Frequency of Social Gatherings with Friends and Family: Not on file  . Attends Religious Services: Not on file  . Active Member of Clubs or Organizations: Not on file  . Attends Banker Meetings: Not on file  . Marital Status: Not on file  Intimate Partner Violence:   . Fear of Current or Ex-Partner: Not on file  . Emotionally Abused: Not on file  . Physically Abused: Not on file  . Sexually Abused: Not on file     PHYSICAL EXAM  Vitals:   07/18/20 0803  BP: (!) 91/56  Pulse: 60  Weight: 132 lb (59.9 kg)  Height: 5\' 6"  (1.676 m)    Body mass index is 21.31 kg/m.  No exam data present   General: The patient is well-developed and well-nourished and in no acute distress  HEENT:  Head is [place]/AT.  Sclera are anicteric.    Skin: Extremities are without rash or edema.   Shin lesion on left arm   Neurologic Exam  Mental status: The patient is alert and oriented x 3  at the time of the examination. The patient has apparent normal recent and remote memory, with an apparently normal attention span and concentration ability.   Speech is normal.  Cranial nerves: Extraocular movements are full.  Color vision was reduced on the left.  Facial strength is normal. No obvious hearing deficits are noted.  Motor:  Muscle bulk is normal.   Tone is normal. Strength is  5 / 5 in all 4 extremities.   Sensory: Sensory testing is intact to pinprick, soft touch and vibration sensation in all 4 extremities.  Coordination: Cerebellar testing reveals good finger-nose-finger and heel-to-shin bilaterally.  Gait  and station: Station is normal.   Gait is mildly wide.  She has difficulty doing a tandem gait.. Romberg is negative.   Reflexes: Deep tendon reflexes are symmetric and normal bilaterally.   Plantar responses are flexor.    DIAGNOSTIC DATA (LABS, IMAGING, TESTING) - I reviewed patient records, labs, notes, testing and imaging myself where available.  Lab Results  Component Value Date   WBC 5.0 03/17/2020   HGB 11.7 03/17/2020   HCT 37.2 03/17/2020   MCV 79 03/17/2020   PLT 393 03/17/2020      Component Value Date/Time   NA 145 (H) 03/17/2020 1002   K 5.0 03/17/2020 1002   CL 102 03/17/2020 1002   CO2 27 03/17/2020 1002   GLUCOSE 57 (L) 03/17/2020 1002   GLUCOSE 112 (H) 08/19/2017 1219   BUN 8 03/17/2020 1002   CREATININE 0.78 03/17/2020 1002   CALCIUM 9.9 03/17/2020 1002   PROT 7.4 03/17/2020 1002   ALBUMIN 4.3 03/17/2020 1002   AST 21 03/17/2020 1002   ALT 9 03/17/2020 1002   ALKPHOS 99 03/17/2020 1002   BILITOT 0.5 03/17/2020 1002   GFRNONAA 97 03/17/2020 1002   GFRAA 112 03/17/2020 1002   Lab Results  Component Value Date   CHOL 203 (H) 05/24/2015   HDL 61 05/24/2015   LDLCALC 120 (H) 05/24/2015   TRIG 112 05/24/2015   CHOLHDL 3.3 05/24/2015   Lab Results  Component Value Date   HGBA1C 5.2 05/24/2015   Lab Results  Component Value Date   EHOZYYQM25 003 03/17/2020   Lab Results  Component Value Date   TSH 1.649 01/13/2015       ASSESSMENT AND PLAN  Multiple sclerosis ([place])  History of gastric bypass  Gait disturbance  High risk medication use  Opioid dependence with opioid-induced disorder (HCC)  Mood disturbance  1.   Advised to start Tecfidera fro  Her RRMS. 2.   I'll have her try Buspar for anxiety.   She will try to setup an appt with psychiatry. 3.   Stay active and exercise 4.   RTC 6 months, sooner if new or worsening issues.   Delorus Langwell A. Felecia Shelling, MD, [facility] 05/01/8888, 1:69 AM Certified in Neurology, Clinical Neurophysiology,  Sleep Medicine and Neuroimaging  [facility] [address] [place], [place] [address] [phone]

## 2021-01-18 ENCOUNTER — Encounter: Payer: Self-pay | Admitting: Neurology

## 2021-01-18 ENCOUNTER — Ambulatory Visit: Payer: Medicare HMO | Admitting: Neurology

## 2021-01-24 ENCOUNTER — Telehealth: Payer: Self-pay | Admitting: Neurology

## 2021-01-24 NOTE — Telephone Encounter (Signed)
Can you please call back to offer to reschedule appointment? I don't see where she has appt. Last seen 06/2020. Thank you

## 2021-01-24 NOTE — Telephone Encounter (Signed)
FYI: Pt's mother, Mellody Drown (on [place]) called, wanted to let you know I did press no to cancel the appt on 01/18/21 on the automated system. Next time I will call to cancel.

## 2021-08-30 DIAGNOSIS — F419 Anxiety disorder, unspecified: Secondary | ICD-10-CM | POA: Diagnosis present

## 2022-11-29 ENCOUNTER — Institutional Professional Consult (permissible substitution): Payer: Medicare HMO | Admitting: Primary Care

## 2022-11-30 ENCOUNTER — Institutional Professional Consult (permissible substitution): Payer: Medicare HMO | Admitting: Adult Health

## 2022-12-18 ENCOUNTER — Encounter: Payer: Self-pay | Admitting: Adult Health

## 2022-12-18 ENCOUNTER — Ambulatory Visit (INDEPENDENT_AMBULATORY_CARE_PROVIDER_SITE_OTHER): Payer: No Typology Code available for payment source | Admitting: Adult Health

## 2022-12-18 VITALS — BP 90/60 | HR 64 | Temp 99.3°F | Ht 66.0 in | Wt 124.0 lb

## 2022-12-18 DIAGNOSIS — G35 Multiple sclerosis: Secondary | ICD-10-CM | POA: Diagnosis not present

## 2022-12-18 DIAGNOSIS — G478 Other sleep disorders: Secondary | ICD-10-CM | POA: Diagnosis not present

## 2022-12-18 DIAGNOSIS — G4719 Other hypersomnia: Secondary | ICD-10-CM

## 2022-12-18 NOTE — Patient Instructions (Signed)
Set up for Sleep study and MSLT study.  Healthy sleep regimen  Do not drive if sleepy  Follow up with Dr. Ander Slade in 6 weeks and As needed

## 2022-12-18 NOTE — Progress Notes (Signed)
$'@Patient'c$  ID: Beth [name], female    DOB: September 30, 1981, 42 y.o.   MRN: [id number]  Chief Complaint  Patient presents with   Consult    Referring provider: Nicholes Rough, Hershal Coria  HPI: 42 year old female presents for a sleep consult December 18, 2022 for significant daytime sleepiness, gasping for air during sleep and sleep paralysis. Medical history significant for multiple sclerosis, history of polysubstance abuse with cocaine, methamphetamine, heroin and alcohol  TEST/EVENTS :   12/18/2022 Sleep consult  Patient presents for a sleep consult today.  Patient is accompanied by her mother today complaining of significant daytime sleepiness, excessive sleep, sleep paralysis, gasping for air during sleep for last year. Prior to this had chronic insomnia.  Says she can not stay away. Wants to sleep all the time, day or night . No medication changes in last year.  Caffeine intake is 1 soda daily . Has Trazodone to take for sleep if needed. Has not taken in over 1 year. Takes neurontin '600mg'$  Twice daily. This was added 1 year ago. Takes Baclofen weekly . Rarely takes Buspar. Takes Hydroxzine twice weekly. Started on [place] for Bipolar disorder., depression and anxiety . Has history of PTSD.  Sleep paralysis started 1.28yr. Has hard time waking up and then feels she is awake but can't move or open eyes. Severe night terrors frequently . Has had history of auditory and visual hallucinations. Says she falls asleep throughout daytime and if she gets upset she can lose muscle tone/feel weak suddenly.  Has history of MS Currently not following with Neurology. Not on maintenance. Has speech and writing , memory and gait issues .  Has history of severe Obesity with previous Gastric bypass 2002. Gastric perforations. Revision in 2005 and 2015. Max weight at 500lbs. Current weight is 124lbs with BMI at 20. Recently started on B12 supplement . Remains on Iron supplements. [facility]  [facility].  History of IVDU with previous osteomyelitis and bacteremia- with prolonged hospitalizations. Denies use currently, last used 6 months . No alcohol in 7 yrs. Disabled. Gets up 7-9 am , if she sits down she will go to sleep for 3-4 hrs. Can not stay awake. Will go back to sleep after lunch . Goes to bed 11pm -1 am , sleeps all night. Sleep is fragmented during night, wakes up and goes back to sleep.  No history of Stroke or CHF .  No removeable dental work. [place] is 24 out of 24.   SH : Married-Separated . No children. Lives at home with Mother. Quit smoking 3 months ago.  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  In [place] since 2011.     Allergies  Allergen Reactions   Cymbalta [Duloxetine Hcl] Other (See Comments)    Made body jerk and twitch, spasms   Dilaudid [Hydromorphone Hcl] Other (See Comments)    Anger, aggression    Naltrexone Swelling   Nsaids Nausea And Vomiting   Zolpidem Other (See Comments)    Sleep walking   Abilify [Aripiprazole] Itching    Clenching of teeth, Grinds teeth   Meperidine     Mental Status Changes, Agitation, swearing   Prednisone Other (See Comments)    Steroids in general    Immunization History  Administered Date(s) Administered   Influenza Inj Mdck Quad Pf 07/20/2021, 07/04/2022   Influenza,inj,Quad PF,6+ Mos 08/21/2014   PFIZER(Purple Top)SARS-COV-2 Vaccination 01/13/2020, 02/14/2020   PNEUMOCOCCAL CONJUGATE-20 07/20/2021   Pneumococcal Polysaccharide-23 04/14/2014  Tdap 06/07/2012    Past Medical History:  Diagnosis Date   Acid reflux    Anxiety    Asthma    Chronic diarrhea    self reported   Chronic nausea    self reported   Chronic pain    self reported chronic pain, back pain, fibromyalgia   Delirium tremens ([place])    Depression    ETOH abuse    Gastric ulcer    History of blood transfusion    Insomnia    Migraine    Multiple sclerosis ([place]) 2005   diagnosed in [place], failed  steroids, no other medication   Polysubstance abuse ([place])    Substance abuse ([place])    Past Surgical History:  Procedure Laterality Date   DIAGNOSTIC LAPAROSCOPY  2003   repair of perforation at [place] anastamosis    GASTRIC BYPASS     LAPAROTOMY N/A 08/18/2014   Procedure: EXPLORATORY LAPAROTOMY, Patch graft of gastric perforation;  Surgeon: Coralie Keens, MD;  Location: [facility];  Service: General;  Laterality: N/A;   LUMBAR LAMINECTOMY/DECOMPRESSION MICRODISCECTOMY Right 04/14/2014   Procedure: LUMBAR LAMINECTOMY/DECOMPRESSION MICRODISCECTOMY LEVEL L4-5;  Surgeon: Erline Levine, MD;  Location: [facility];  Service: Neurosurgery;  Laterality: Right;  LUMBAR LAMINECTOMY/DECOMPRESSION MICRODISCECTOMY LEVEL L4-5     Tobacco History: Social History   Tobacco Use  Smoking Status Former   Packs/day: 2.00   Years: 27.00   Total pack years: 54.00   Types: Cigarettes   Quit date: 09/28/2022   Years since quitting: 0.2  Smokeless Tobacco Never   Counseling given: Not Answered   Outpatient Medications Prior to Visit  Medication Sig Dispense Refill   baclofen (LIORESAL) 10 MG tablet Take 10 mg by mouth 3 (three) times daily as needed.     busPIRone (BUSPAR) 15 MG tablet Take 1 tablet (15 mg total) by mouth 2 (two) times daily. (Patient taking differently: Take 30 mg by mouth 2 (two) times daily as needed.) 60 tablet 5   clindamycin (CLEOCIN T) 1 % external solution Apply 1 Application topically 2 (two) times daily.     cyclobenzaprine (FLEXERIL) 10 MG tablet Take 10 mg by mouth 3 (three) times daily as needed for muscle spasms.     ferrous sulfate 325 (65 FE) MG tablet Take 325 mg by mouth every other day.     gabapentin (NEURONTIN) 600 MG tablet Take 600 mg by mouth 3 (three) times daily as needed.     hydrOXYzine (ATARAX) 50 MG tablet Take 50 mg by mouth in the morning, at noon, in the evening, and at bedtime.     linaclotide (LINZESS) 145 MCG CAPS capsule Take 72 mcg by mouth daily before  breakfast.     Lurasidone HCl (LATUDA) 60 MG TABS Take 60 mg by mouth daily at 6 (six) AM.     omeprazole (PRILOSEC) 20 MG capsule Take 20 mg by mouth daily.     ondansetron (ZOFRAN) 4 MG tablet Take 4 mg by mouth every 4 (four) hours as needed for nausea or vomiting.     rizatriptan (MAXALT-MLT) 10 MG disintegrating tablet Take 10 mg by mouth as needed.     traZODone (DESYREL) 100 MG tablet Take 100 mg by mouth at bedtime as needed. 30 minutes prior to HS     valACYclovir (VALTREX) 500 MG tablet Take 500 mg by mouth daily as needed.     ALPRAZolam (XANAX) 1 MG tablet 1-2 tabs 30 mins before MRI and 1 extra at the time  of study if needed. (Patient not taking: Reported on 12/18/2022) 3 tablet 0   Buprenorphine HCl-Naloxone HCl 8-2 MG FILM Place 8 mg of opioid under the tongue daily. (Patient not taking: Reported on 12/18/2022)     norethindrone (AYGESTIN) 5 MG tablet Take 5 mg by mouth daily. (Patient not taking: Reported on 12/18/2022)     Dimethyl Fumarate (TECFIDERA) 120 & 240 MG MISC Take by mouth. (Patient not taking: Reported on 12/18/2022)     levonorgestrel-ethinyl estradiol (ALESSE) 0.1-20 MG-MCG tablet Take 1 tablet by mouth daily. (Patient not taking: Reported on 12/18/2022)     METHADONE HCL PO Take 120 mg by mouth daily.      modafinil (PROVIGIL) 200 MG tablet Take 1 tablet (200 mg total) by mouth daily. (Patient not taking: Reported on 12/18/2022) 30 tablet 5   SUMAtriptan (IMITREX) 50 MG tablet Take 50 mg by mouth every 2 (two) hours as needed for migraine. May repeat in 2 hours if headache persists or recurs. (Patient not taking: Reported on 12/18/2022)     No facility-administered medications prior to visit.     Review of Systems:   Constitutional:   No  weight loss, night sweats,  Fevers, chills,  +fatigue, or  lassitude.  HEENT:   No headaches,  Difficulty swallowing,  Tooth/dental problems, or  Sore throat,                No sneezing, itching, ear ache, nasal congestion, post  nasal drip,   CV:  No chest pain,  Orthopnea, PND, swelling in lower extremities, anasarca, dizziness, palpitations, syncope.   GI  No heartburn, indigestion, abdominal pain, nausea, vomiting, diarrhea, change in bowel habits, loss of appetite, bloody stools.   Resp: No shortness of breath with exertion or at rest.  No excess mucus, no productive cough,  No non-productive cough,  No coughing up of blood.  No change in color of mucus.  No wheezing.  No chest wall deformity  Skin: no rash or lesions.  GU: no dysuria, change in color of urine, no urgency or frequency.  No flank pain, no hematuria   MS:  No joint pain or swelling.  No decreased range of motion.  No back pain.    Physical Exam  BP 90/60 (BP Location: Left Arm, Patient Position: Sitting, Cuff Size: Normal)   Pulse 64   Temp 99.3 F (37.4 C) (Oral)   Ht '5\' 6"'$  (1.676 m)   Wt 124 lb (56.2 kg)   SpO2 99%   BMI 20.01 kg/m   GEN: A/Ox3; pleasant , NAD, thin    HEENT:  [place]/AT,  NOSE-clear, THROAT-clear, no lesions, no postnasal drip or exudate noted. Class 2-3 MP airway  NECK:  Supple w/ fair ROM; no JVD; normal carotid impulses w/o bruits; no thyromegaly or nodules palpated; no lymphadenopathy.    RESP  Clear  P & A; w/o, wheezes/ rales/ or rhonchi. no accessory muscle use, no dullness to percussion  CARD:  RRR, no m/r/g, no peripheral edema, pulses intact, no cyanosis or clubbing.  GI:   Soft & nt; nml bowel sounds; no organomegaly or masses detected.   Musco: Warm bil, no deformities or joint swelling noted.   Neuro: alert, no focal deficits noted.    Skin: Warm, no lesions or rashes    Lab Results:      BNP No results found for: "BNP"  ProBNP No results found for: "PROBNP"  Imaging: No results found.  No data to display          No results found for: "NITRICOXIDE"      Assessment & Plan:   No problem-specific Assessment & Plan notes found for this encounter.     Rexene Edison, NP 12/18/2022

## 2022-12-21 DIAGNOSIS — G478 Other sleep disorders: Secondary | ICD-10-CM | POA: Insufficient documentation

## 2022-12-21 NOTE — Assessment & Plan Note (Addendum)
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 Complicated case with underlying Depression, anxiety, PTSD , hx of IVDU.  Will need to evaluate for underlying sleep apnea . Set up for in lab sleep study followed by MSLT . High suspicion for Narcolepsy with cataplexy.  Patient education given on healthy sleep regimen.  Use caution with sedating medications   Plan  Patient Instructions  Set up for Sleep study and MSLT study.  Healthy sleep regimen  Do not drive if sleepy  Follow up with Dr. Ander Slade in 6 weeks and As needed

## 2022-12-21 NOTE — Progress Notes (Signed)
Reviewed and agree with assessment/plan.   Chesley Mires, MD [facility] Pulmonary/Critical Care 12/21/2022, 2:18 PM Pager:  [phone]

## 2022-12-28 ENCOUNTER — Emergency Department (HOSPITAL_COMMUNITY): Payer: Medicare HMO

## 2022-12-28 ENCOUNTER — Emergency Department (HOSPITAL_COMMUNITY)
Admission: EM | Admit: 2022-12-28 | Discharge: 2022-12-29 | Disposition: A | Discharge status: Home / Self Care | Payer: Medicare HMO | Attending: Emergency Medicine | Admitting: Emergency Medicine

## 2022-12-28 DIAGNOSIS — R443 Hallucinations, unspecified: Secondary | ICD-10-CM | POA: Insufficient documentation

## 2022-12-28 DIAGNOSIS — F1910 Other psychoactive substance abuse, uncomplicated: Secondary | ICD-10-CM | POA: Diagnosis present

## 2022-12-28 DIAGNOSIS — F1990 Other psychoactive substance use, unspecified, uncomplicated: Secondary | ICD-10-CM

## 2022-12-28 LAB — I-STAT VENOUS BLOOD GAS, ED
Acid-Base Excess: 1 mmol/L (ref 0.0–2.0)
Bicarbonate: 25.1 mmol/L (ref 20.0–28.0)
Calcium, Ion: 1.23 mmol/L (ref 1.15–1.40)
HCT: 37 % (ref 36.0–46.0)
Hemoglobin: 12.6 g/dL (ref 12.0–15.0)
O2 Saturation: 77 %
Potassium: 3.8 mmol/L (ref 3.5–5.1)
Sodium: 144 mmol/L (ref 135–145)
TCO2: 26 mmol/L (ref 22–32)
pCO2, Ven: 38.3 mmHg — ABNORMAL LOW (ref 44–60)
pH, Ven: 7.425 (ref 7.25–7.43)
pO2, Ven: 41 mmHg (ref 32–45)

## 2022-12-28 LAB — RAPID URINE DRUG SCREEN, HOSP PERFORMED
Amphetamines: POSITIVE — AB
Barbiturates: NOT DETECTED
Benzodiazepines: POSITIVE — AB
Cocaine: POSITIVE — AB
Opiates: NOT DETECTED
Tetrahydrocannabinol: NOT DETECTED

## 2022-12-28 LAB — CBC WITH DIFFERENTIAL/PLATELET
Abs Immature Granulocytes: 0.01 10*3/uL (ref 0.00–0.07)
Basophils Absolute: 0 10*3/uL (ref 0.0–0.1)
Basophils Relative: 1 %
Eosinophils Absolute: 0 10*3/uL (ref 0.0–0.5)
Eosinophils Relative: 1 %
HCT: 36.7 % (ref 36.0–46.0)
Hemoglobin: 12 g/dL (ref 12.0–15.0)
Immature Granulocytes: 0 %
Lymphocytes Relative: 25 %
Lymphs Abs: 1.8 10*3/uL (ref 0.7–4.0)
MCH: 24.6 pg — ABNORMAL LOW (ref 26.0–34.0)
MCHC: 32.7 g/dL (ref 30.0–36.0)
MCV: 75.2 fL — ABNORMAL LOW (ref 80.0–100.0)
Monocytes Absolute: 0.6 10*3/uL (ref 0.1–1.0)
Monocytes Relative: 8 %
Neutro Abs: 4.5 10*3/uL (ref 1.7–7.7)
Neutrophils Relative %: 65 %
Platelets: 365 10*3/uL (ref 150–400)
RBC: 4.88 MIL/uL (ref 3.87–5.11)
RDW: 14.7 % (ref 11.5–15.5)
WBC: 6.9 10*3/uL (ref 4.0–10.5)
nRBC: 0 % (ref 0.0–0.2)

## 2022-12-28 LAB — COMPREHENSIVE METABOLIC PANEL
ALT: 9 U/L (ref 0–44)
AST: 18 U/L (ref 15–41)
Albumin: 3.7 g/dL (ref 3.5–5.0)
Alkaline Phosphatase: 62 U/L (ref 38–126)
Anion gap: 12 (ref 5–15)
BUN: 12 mg/dL (ref 6–20)
CO2: 22 mmol/L (ref 22–32)
Calcium: 9.6 mg/dL (ref 8.9–10.3)
Chloride: 107 mmol/L (ref 98–111)
Creatinine, Ser: 1.05 mg/dL — ABNORMAL HIGH (ref 0.44–1.00)
GFR, Estimated: >60 mL/min (ref >60)
Glucose, Bld: 102 mg/dL — ABNORMAL HIGH (ref 70–99)
Potassium: 3.7 mmol/L (ref 3.5–5.1)
Sodium: 141 mmol/L (ref 135–145)
Total Bilirubin: 0.6 mg/dL (ref 0.3–1.2)
Total Protein: 7.8 g/dL (ref 6.5–8.1)

## 2022-12-28 LAB — I-STAT BETA HCG BLOOD, ED (MC, WL, AP ONLY): I-stat hCG, quantitative: <5 m[IU]/mL (ref <5)

## 2022-12-28 LAB — CK: Total CK: 41 U/L (ref 38–234)

## 2022-12-28 LAB — AMMONIA: Ammonia: 25 umol/L (ref 9–35)

## 2022-12-28 LAB — LACTIC ACID, PLASMA
Lactic Acid, Venous: 1.3 mmol/L (ref 0.5–1.9)
Lactic Acid, Venous: 0.7 mmol/L (ref 0.5–1.9)

## 2022-12-28 MED ORDER — LACTATED RINGERS IV BOLUS
1000.0000 mL | Freq: Once | INTRAVENOUS | Status: AC
  Administered 2022-12-28: 1000 mL via INTRAVENOUS

## 2022-12-28 NOTE — ED Provider Notes (Incomplete)
[facility] Provider Note   CSN: [id number] Arrival date & time: 12/28/22  1812     History {Add pertinent medical, surgical, social history, OB history to HPI:1} Chief Complaint  Patient presents with   Hallucinations    Beth [name] is a 42 y.o. female.  HPI     Home Medications Prior to Admission medications   Medication Sig Start Date End Date Taking? Authorizing Provider  ALPRAZolam (XANAX) 1 MG tablet 1-2 tabs 30 mins before MRI and 1 extra at the time of study if needed. Patient not taking: Reported on 12/18/2022 04/12/20   Britt Bottom, MD  baclofen (LIORESAL) 10 MG tablet Take 10 mg by mouth 3 (three) times daily as needed. 11/02/22   [provider]  Buprenorphine HCl-Naloxone HCl 8-2 MG FILM Place 8 mg of opioid under the tongue daily. Patient not taking: Reported on 12/18/2022 12/17/22   [provider]  busPIRone (BUSPAR) 15 MG tablet Take 1 tablet (15 mg total) by mouth 2 (two) times daily. Patient taking differently: Take 30 mg by mouth 2 (two) times daily as needed. 07/18/20   Sater, Nanine Means, MD  clindamycin (CLEOCIN T) 1 % external solution Apply 1 Application topically 2 (two) times daily.    [provider]  cyclobenzaprine (FLEXERIL) 10 MG tablet Take 10 mg by mouth 3 (three) times daily as needed for muscle spasms.    [provider]  ferrous sulfate 325 (65 FE) MG tablet Take 325 mg by mouth every other day. 08/17/21   [provider]  gabapentin (NEURONTIN) 600 MG tablet Take 600 mg by mouth 3 (three) times daily as needed.    [provider]  hydrOXYzine (ATARAX) 50 MG tablet Take 50 mg by mouth in the morning, at noon, in the evening, and at bedtime.    [provider]  linaclotide (LINZESS) 145 MCG CAPS capsule Take 72 mcg by mouth daily before breakfast.    [provider]  Lurasidone HCl (LATUDA) 60 MG TABS Take 60 mg by mouth daily at 6  (six) AM.    [provider]  norethindrone (AYGESTIN) 5 MG tablet Take 5 mg by mouth daily. Patient not taking: Reported on 12/18/2022    [provider]  omeprazole (PRILOSEC) 20 MG capsule Take 20 mg by mouth daily.    [provider]  ondansetron (ZOFRAN) 4 MG tablet Take 4 mg by mouth every 4 (four) hours as needed for nausea or vomiting.    [provider]  rizatriptan (MAXALT-MLT) 10 MG disintegrating tablet Take 10 mg by mouth as needed. 11/02/22   [provider]  traZODone (DESYREL) 100 MG tablet Take 100 mg by mouth at bedtime as needed. 30 minutes prior to HS 07/04/22   [provider]  valACYclovir (VALTREX) 500 MG tablet Take 500 mg by mouth daily as needed.    [provider]      Allergies    Cymbalta [duloxetine hcl], Dilaudid [hydromorphone hcl], Naltrexone, Nsaids, Zolpidem, Abilify [aripiprazole], Meperidine, and Prednisone    Review of Systems   Review of Systems  Physical Exam Updated Vital Signs BP 121/80   Pulse 78   Temp (!) 97.5 F (36.4 C) (Oral)   Resp (!) 23   SpO2 100%  Physical Exam  ED Results / Procedures / Treatments   Labs (all labs ordered are listed, but only abnormal results are displayed) Labs Reviewed  CBC WITH DIFFERENTIAL/PLATELET -  Abnormal; Notable for the following components:      Result Value   MCV 75.2 (*)    MCH 24.6 (*)    All other components within normal limits  COMPREHENSIVE METABOLIC PANEL - Abnormal; Notable for the following components:   Glucose, Bld 102 (*)    Creatinine, Ser 1.05 (*)    All other components within normal limits  I-STAT VENOUS BLOOD GAS, ED - Abnormal; Notable for the following components:   pCO2, Ven 38.3 (*)    All other components within normal limits  LACTIC ACID, PLASMA  CK  LACTIC ACID, PLASMA  AMMONIA  RAPID URINE DRUG SCREEN, HOSP PERFORMED  I-STAT BETA HCG BLOOD, ED ([facility], [facility], [facility] ONLY)    EKG None  Radiology No results  found.  Procedures Procedures  {Document cardiac monitor, telemetry assessment procedure when appropriate:1}  Medications Ordered in ED Medications  lactated ringers bolus 1,000 mL (has no administration in time range)    ED Course/ Medical Decision Making/ A&P   {   Click here for ABCD2, HEART and other calculatorsREFRESH Note before signing :1}                          Medical Decision Making Amount and/or Complexity of Data Reviewed Labs: ordered. Radiology: ordered.   ***  {Document critical care time when appropriate:1} {Document review of labs and clinical decision tools ie heart score, Chads2Vasc2 etc:1}  {Document your independent review of radiology images, and any outside records:1} {Document your discussion with family members, caretakers, and with consultants:1} {Document social determinants of health affecting pt's care:1} {Document your decision making why or why not admission, treatments were needed:1} Final Clinical Impression(s) / ED Diagnoses Final diagnoses:  None    Rx / DC Orders ED Discharge Orders     None

## 2022-12-28 NOTE — ED Triage Notes (Signed)
Pt bib EMS coming from home. Pt reports visual and auditory hallucinations today. Denies SI/SI. Hx bipolar and was off medications for several days but did take her medications today. Hx cocaine use (used this morning) and heroin (used last week). Pt arrives to ED lethargic, mumbling, responsive to painful stimuli with pinpoint pupils.

## 2022-12-29 MED ORDER — NALOXONE HCL 0.4 MG/ML IJ SOLN
0.2000 mg | Freq: Once | INTRAMUSCULAR | Status: AC
  Administered 2022-12-29: 0.2 mg via INTRAVENOUS
  Filled 2022-12-29: qty 1

## 2022-12-29 MED ORDER — LACTATED RINGERS IV BOLUS
1000.0000 mL | Freq: Once | INTRAVENOUS | Status: AC
  Administered 2022-12-29: 1000 mL via INTRAVENOUS

## 2022-12-29 NOTE — ED Notes (Signed)
Patient provided saltine crackers and apple juice which she ate and drank.

## 2022-12-29 NOTE — Discharge Instructions (Signed)
If you like help with polysubstance use obtain the outpatient resources please review  Also provide you with the information for behavioral health they are open 24-hour stay please follow-up if needed.  Come back to the emergency department if you develop chest pain, shortness of breath, severe abdominal pain, uncontrolled nausea, vomiting, diarrhea.

## 2022-12-29 NOTE — ED Provider Notes (Signed)
Received at shift change from Sherrell Puller, PA-C please see note for full detail  Patient with medical history including MS, anxiety, biceps dependency presented with concerns of altered mental status, per previous provider patient was sent here by her mother for concerns of hallucinations, mother states that patient recently ran away 2 days ago and came back admits to polysubstance use.  Mother states that since then patient has been hallucinating.  On my evaluation patient is somnolent, and would only answer minimal questions, she states that she has no complaints, she states that she is just tired.  Denies headaches, change in vision, numbness or weakness in the upper or lower extremities chest pain shortness of breath stomach pains nausea vomiting.  Does not endorse any suicidal homicidal ideations.  Per previous provider patient had taken her home medication which includes gabapentin, Vistaril, Latuda prior to arrival and is likely altered from this she recommends, recommends metabolized to baseline. Physical Exam  BP (!) 115/92   Pulse 65   Temp 97.9 F (36.6 C)   Resp 18   SpO2 100%   Physical Exam Vitals and nursing note reviewed.  Constitutional:      General: She is not in acute distress.    Appearance: She is not ill-appearing.     Comments: No acute distress, disheveled, unkept.  HENT:     Head: Normocephalic and atraumatic.     Comments: No deformity head present no raccoon eyes or Battle sign noted.    Nose: No congestion.     Mouth/Throat:     Mouth: Mucous membranes are dry.     Pharynx: Oropharynx is clear.  Eyes:     Extraocular Movements: Extraocular movements intact.     Conjunctiva/sclera: Conjunctivae normal.     Pupils: Pupils are equal, round, and reactive to light.  Cardiovascular:     Rate and Rhythm: Normal rate and regular rhythm.     Pulses: Normal pulses.     Heart sounds: No murmur heard.    No friction rub. No gallop.  Pulmonary:     Effort: No  respiratory distress.     Breath sounds: No wheezing, rhonchi or rales.  Abdominal:     Palpations: Abdomen is soft.     Tenderness: There is no abdominal tenderness. There is no right CVA tenderness or left CVA tenderness.  Skin:    General: Skin is warm and dry.  Neurological:     Mental Status: She is alert.     Comments: No facial asymmetry, no slurring of words, following two-step commands, no unilateral weakness present.  Psychiatric:        Mood and Affect: Mood normal.     Comments: Patient is somnolent but arousable, she is protecting her oral airway, not endorsing any suicidal Holmes ideations does not appear to be respond to internal stimuli.     Procedures  Procedures  ED Course / MDM    Medical Decision Making Amount and/or Complexity of Data Reviewed Labs: ordered. Radiology: ordered.  Risk Prescription drug management.     Lab Tests:  I Ordered, and personally interpreted labs.  The pertinent results include: CBC is unremarkable, CMP shows glucose of 102, creatinine 1.05, ammonia 25, CK 41 right ear drug screen positive for cocaine, benzodiazepine amphetamines, i-STAT unremarkable lactic negative   Imaging Studies ordered:  I ordered imaging studies including CT head I independently visualized and interpreted imaging which showed negative acute fine lungs I agree with the radiologist interpretation   Cardiac  Monitoring:  The patient was maintained on a cardiac monitor.  I personally viewed and interpreted the cardiac monitored which showed an underlying rhythm of: EKG sinus without signs of ischemia   Medicines ordered and prescription drug management:  I ordered medication including fluids I have reviewed the patients home medicines and have made adjustments as needed  Critical Interventions:  N/A   Reevaluation:  Presents with concerns of hallucinations, on my evaluation patient was asleep, resting comfortably, she is arousable, vital  signs have remained stable, will attempt to ambulate the patient, p.o. challenge.  Likely patient will need more time to metabolize back to baseline.  Patient was reassessed, she is responding appropriately, will p.o. challenge and continue to monitor.  Patient reassessed, she is back to her baseline, she is agreement discharge at this time.  Consultations Obtained:  N/A    Test Considered:  N/A    Rule out low suspicion for internal head bleed and or mass as CT imaging is negative for acute findings.  Low suspicion for CVA she has no focal deficit present my exam. Low suspicion for meningitis as she has no meningeal sign present.  I doubt MS flareup she is not endorsing any unilateral weakness or paresthesias, presentation is atypical.  She did presents somnolent on my exam, this improved, likely secondary due to polypharmacy i.e. cocaine and benzodiazepine and amphetamine use as well as taking her medications including gabapentin, Latuda and Vistaril prior to arrival.  Suspicion for psychiatric emergency is low as she does not endorse any suicidal or homicidal ideations, she responding appropriately does not appear to be respond to internal stimuli.     Dispostion and problem list  After consideration of the diagnostic results and the patients response to treatment, I feel that the patent would benefit from discharge.  Polysubstance use-likely the cause of her somnolent state, will provide her with outpatient counseling for substance use.            Marcello Fennel, PA-C 12/29/22 0536    Fatima Blank, MD 12/29/22 [phone]

## 2023-01-25 ENCOUNTER — Ambulatory Visit: Payer: Medicare HMO | Admitting: Pulmonary Disease

## 2023-02-27 ENCOUNTER — Institutional Professional Consult (permissible substitution): Payer: Medicare HMO | Admitting: Neurology

## 2023-03-11 NOTE — Progress Notes (Unsigned)
[facility]  PATIENT: Beth [name] DOB: [birth year]  REFERRING DOCTOR OR PCP:  Georgette Shell, PA-C SOURCE: Patient, notes from primary care, notes from [facility] neurology, imaging and lab reports, MRI images personally reviewed.  _________________________________   HISTORICAL  CHIEF COMPLAINT:  No chief complaint on file.   HISTORY OF PRESENT ILLNESS:  I had the pleasure of seeing your patient, Beth [name], at the [facility] at [facility] for a neurologic consult regarding her relapsing remitting multiple sclerosis.  She is a 42 year old woman who was diagnosed with MS in 2001.  I had seen her in 2021 after she presented with an exacerbation causing diplopia she had not been on a disease modifying therapy for couple years and I have tried to get her on Tecfidera.  She reports that her gait is off balance.  Strength and sensation are normal.  She denies spasticity.    She denies difficulty with vision.  Diplopia resolved.  She has fatigue.  She was briefly on modafinil but it did not help.  She notes reduced focus and attention and sometimes feels in a fog.  Sleep is often poor.  She has been diagnosed with bipolar disease and personality disorder and anxiety.  Most recently, Seroquel was poorly tolerated with sleepwalking.    Ambien caused sleepwalking.  Risperdal and Abilify caused her to have sleepwalking.  Lamotrigine was tried several times and she had tolerability issues.   She was once on buspirone.    She reported that Zoloft made her more manic.    She developed discitis at T7-T8 in 2022 and was placed on IV antibiotics with resolution   MS History: She was diagnosed with MS in 2001.   At that time, at age 17, she had numbness and reduced dexterity in the hands causing reduced typing. Her right hand sometimes jerked and she had slurred speech.   An MRI was consistent with MS and she had IV steroids.   She had an ulcer develop.  She  was started on Rebif.   Due to side effects she stopped.   She had a few exacerbations over the years. She had a lot going on with her sister committing suicide and she becoming an alcoholic.  For many years she did not see any neurologist . Around 2014, she started to have more difficulty with balance and gait .  She had a lumbar fracture after a fall in 2016.   Repeat imaging was performed and she was referred to Dr. Gaynelle Adu at [facility].   She was placed on Tecfidera but stopped due to hair loss and flushing.  She stopped in 2018.  She developed diplopia spring 2021 I had seen her in early 2021 and we tried to get her back on Tecfidera.  Other pertinent History: She has a h/o gastric bypass in 2002.    She lost a lot of weight and gained back quite a bit.  Over the last 2 years, she has lost her weight (on Topamax at the time)  but is steady at 130 pounds  She has chronic Hepatitis C and has an appointment to see GI soon.  She is currently on methadone 120 mg daily for pain but also had an opiate drug addiction in the past.  She does have degenerative changes in her lumbar spine causing back and leg pain.  She had right hemilaminectomy at L4-L5 due to a disc herniation.  IMAGING REVIEW MRI of the brain dated  09/16/2015.  It shows T2/FLAIR hyperintense foci in the cerebellar hemisphere, left medulla, and in the periventricular and juxtacortical white matter.  None of the foci enhanced.  MRI of the cervical spine showed T2 hyperintense foci to the right at C1-C2, posteriorly at C2, small focus to the left of C5 and anteriorly to the right at C5-C6.  MRI brain 06/02/2020 showed Multiple T2/FLAIR hyperintense foci in the hemispheres, cerebellum and medulla in a pattern and configuration consistent with chronic demyelinating plaque associated with multiple sclerosis.  None of the foci appear to be acute and they do not enhance.  Compared to the MRI dated 09/16/2015, there are no new lesions.       MRI  cervical spine 06/02/2020 showed several T2 hyperintense foci within the spinal cord as detailed above.  None of these enhance after contrast and they were all present on the previous MRI from 2016.  These are consistent with stable chronic demyelinating plaque associated with multiple sclerosis.  Mild disc degenerative changes at C4-C5 and C5-C6 that do not lead to spinal stenosis or nerve root compression.  The degenerative changes have progressed compared to the previous MR.  Labs: Vitamin B12 and copper were normal 03/17/2020  REVIEW OF SYSTEMS: Constitutional: No fevers, chills, sweats, or change in appetite.  She has fatigue. Eyes: No visual changes, double vision, eye pain Ear, nose and throat: No hearing loss, ear pain, nasal congestion, sore throat Cardiovascular: No chest pain, palpitations Respiratory:  No shortness of breath at rest or with exertion.   No wheezes GastrointestinaI: No nausea, vomiting, diarrhea, abdominal pain, fecal incontinence.  She has asymptomatic chronic hepatitis C Genitourinary:  No dysuria, urinary retention or frequency.  No nocturia. Musculoskeletal: She reports lower back and leg pain. Integumentary: No rash, pruritus, skin lesions Neurological: as above Psychiatric: No depression at this time.  No anxiety Endocrine: No palpitations, diaphoresis, change in appetite, change in weigh or increased thirst Hematologic/Lymphatic:  No anemia, purpura, petechiae. Allergic/Immunologic: No itchy/runny eyes, nasal congestion, recent allergic reactions, rashes  ALLERGIES: Allergies  Allergen Reactions   Cymbalta [Duloxetine Hcl] Other (See Comments)    Made body jerk and twitch, spasms   Dilaudid [Hydromorphone Hcl] Other (See Comments)    Anger, aggression    Naltrexone Swelling   Nsaids Nausea And Vomiting   Zolpidem Other (See Comments)    Sleep walking   Abilify [Aripiprazole] Itching    Clenching of teeth, Grinds teeth   Meperidine     Mental Status  Changes, Agitation, swearing   Prednisone Other (See Comments)    Steroids in general    HOME MEDICATIONS:  Current Outpatient Medications:    ALPRAZolam (XANAX) 1 MG tablet, 1-2 tabs 30 mins before MRI and 1 extra at the time of study if needed. (Patient not taking: Reported on 12/18/2022), Disp: 3 tablet, Rfl: 0   baclofen (LIORESAL) 10 MG tablet, Take 10 mg by mouth 3 (three) times daily as needed., Disp: , Rfl:    Buprenorphine HCl-Naloxone HCl 8-2 MG FILM, Place 8 mg of opioid under the tongue daily. (Patient not taking: Reported on 12/18/2022), Disp: , Rfl:    busPIRone (BUSPAR) 15 MG tablet, Take 1 tablet (15 mg total) by mouth 2 (two) times daily. (Patient taking differently: Take 30 mg by mouth 2 (two) times daily as needed.), Disp: 60 tablet, Rfl: 5   clindamycin (CLEOCIN T) 1 % external solution, Apply 1 Application topically 2 (two) times daily., Disp: , Rfl:    cyclobenzaprine (FLEXERIL)  10 MG tablet, Take 10 mg by mouth 3 (three) times daily as needed for muscle spasms., Disp: , Rfl:    ferrous sulfate 325 (65 FE) MG tablet, Take 325 mg by mouth every other day., Disp: , Rfl:    gabapentin (NEURONTIN) 600 MG tablet, Take 600 mg by mouth 3 (three) times daily as needed., Disp: , Rfl:    hydrOXYzine (ATARAX) 50 MG tablet, Take 50 mg by mouth in the morning, at noon, in the evening, and at bedtime., Disp: , Rfl:    linaclotide (LINZESS) 145 MCG CAPS capsule, Take 72 mcg by mouth daily before breakfast., Disp: , Rfl:    Lurasidone HCl (LATUDA) 60 MG TABS, Take 60 mg by mouth daily at 6 (six) AM., Disp: , Rfl:    norethindrone (AYGESTIN) 5 MG tablet, Take 5 mg by mouth daily. (Patient not taking: Reported on 12/18/2022), Disp: , Rfl:    omeprazole (PRILOSEC) 20 MG capsule, Take 20 mg by mouth daily., Disp: , Rfl:    ondansetron (ZOFRAN) 4 MG tablet, Take 4 mg by mouth every 4 (four) hours as needed for nausea or vomiting., Disp: , Rfl:    rizatriptan (MAXALT-MLT) 10 MG disintegrating  tablet, Take 10 mg by mouth as needed., Disp: , Rfl:    traZODone (DESYREL) 100 MG tablet, Take 100 mg by mouth at bedtime as needed. 30 minutes prior to HS, Disp: , Rfl:    valACYclovir (VALTREX) 500 MG tablet, Take 500 mg by mouth daily as needed., Disp: , Rfl:   PAST MEDICAL HISTORY: Past Medical History:  Diagnosis Date   Acid reflux    Anxiety    Asthma    Chronic diarrhea    self reported   Chronic nausea    self reported   Chronic pain    self reported chronic pain, back pain, fibromyalgia   Delirium tremens (HCC)    Depression    ETOH abuse    Gastric ulcer    History of blood transfusion    Insomnia    Migraine    Multiple sclerosis (HCC) 2005   diagnosed in [place], failed steroids, no other medication   Polysubstance abuse (HCC)    Substance abuse (HCC)     PAST SURGICAL HISTORY: Past Surgical History:  Procedure Laterality Date   DIAGNOSTIC LAPAROSCOPY  2003   repair of perforation at [facility] anastamosis    GASTRIC BYPASS     LAPAROTOMY N/A 08/18/2014   Procedure: EXPLORATORY LAPAROTOMY, Patch graft of gastric perforation;  Surgeon: Abigail Miyamoto, MD;  Location: [facility] OR;  Service: General;  Laterality: N/A;   LUMBAR LAMINECTOMY/DECOMPRESSION MICRODISCECTOMY Right 04/14/2014   Procedure: LUMBAR LAMINECTOMY/DECOMPRESSION MICRODISCECTOMY LEVEL L4-5;  Surgeon: Maeola Harman, MD;  Location: [facility];  Service: Neurosurgery;  Laterality: Right;  LUMBAR LAMINECTOMY/DECOMPRESSION MICRODISCECTOMY LEVEL L4-5    FAMILY HISTORY: Family History  Problem Relation Age of Onset   Hypertension Paternal Grandfather    Colon cancer Paternal Grandfather 16   Asthma Paternal Grandmother    Hypertension Paternal Grandmother    Breast cancer Paternal Grandmother    Cancer Paternal Grandmother        lung    Diabetes Maternal Grandmother    Diabetes Maternal Grandfather    Hypertension Father    Alcohol abuse Father    Urinary tract infection Mother    Stroke Mother 58        3 strokes     SOCIAL HISTORY:  Social History   Socioeconomic History  Marital status: Single    Spouse name: Alice   Number of children: 0   Years of education: 12   Highest education level: Not on file  Occupational History   Occupation: disability  Tobacco Use   Smoking status: Former    Packs/day: 2.00    Years: 27.00    Additional pack years: 0.00    Total pack years: 54.00    Types: Cigarettes    Quit date: 09/28/2022    Years since quitting: 0.4   Smokeless tobacco: Never  Vaping Use   Vaping Use: Never used  Substance and Sexual Activity   Alcohol use: No    Alcohol/week: 0.0 standard drinks of alcohol    Comment: patient states she does not drink any more.   Drug use: No    Types: Marijuana, Cocaine, Methamphetamines, Oxycodone, Heroin    Comment: heroin   Sexual activity: Yes    Birth control/protection: None, Condom  Other Topics Concern   Not on file  Social History Narrative   Lives with mother, Fulton Mole   Caffeine use: soda (3-4 per day)   No coffee   Social Determinants of Corporate investment banker Strain: Not on file  Food Insecurity: Not on file  Transportation Needs: Not on file  Physical Activity: Not on file  Stress: Not on file  Social Connections: Not on file  Intimate Partner Violence: Not on file     PHYSICAL EXAM  There were no vitals filed for this visit.   There is no height or weight on file to calculate BMI.  No results found.   General: The patient is well-developed and well-nourished and in no acute distress  HEENT:  Head is [place]/AT.  Sclera are anicteric.    Skin: Extremities are without rash or edema.   Shin lesion on left arm   Neurologic Exam  Mental status: The patient is alert and oriented x 3 at the time of the examination. The patient has apparent normal recent and remote memory, with an apparently normal attention span and concentration ability.   Speech is normal.  Cranial nerves: Extraocular movements  are full.  Color vision was reduced on the left.  Facial strength is normal. No obvious hearing deficits are noted.  Motor:  Muscle bulk is normal.   Tone is normal. Strength is  5 / 5 in all 4 extremities.   Sensory: Sensory testing is intact to pinprick, soft touch and vibration sensation in all 4 extremities.  Coordination: Cerebellar testing reveals good finger-nose-finger and heel-to-shin bilaterally.  Gait and station: Station is normal.   Gait is mildly wide.  She has difficulty doing a tandem gait.. Romberg is negative.   Reflexes: Deep tendon reflexes are symmetric and normal bilaterally.   Plantar responses are flexor.    DIAGNOSTIC DATA (LABS, IMAGING, TESTING) - I reviewed patient records, labs, notes, testing and imaging myself where available.  Lab Results  Component Value Date   WBC 6.9 12/28/2022   HGB 12.6 12/28/2022   HCT 37.0 12/28/2022   MCV 75.2 (L) 12/28/2022   PLT 365 12/28/2022      Component Value Date/Time   NA 144 12/28/2022 1841   NA 145 (H) 03/17/2020 1002   K 3.8 12/28/2022 1841   CL 107 12/28/2022 1835   CO2 22 12/28/2022 1835   GLUCOSE 102 (H) 12/28/2022 1835   BUN 12 12/28/2022 1835   BUN 8 03/17/2020 1002   CREATININE 1.05 (H) 12/28/2022 1835   CALCIUM  9.6 12/28/2022 1835   PROT 7.8 12/28/2022 1835   PROT 7.4 03/17/2020 1002   ALBUMIN 3.7 12/28/2022 1835   ALBUMIN 4.3 03/17/2020 1002   AST 18 12/28/2022 1835   ALT 9 12/28/2022 1835   ALKPHOS 62 12/28/2022 1835   BILITOT 0.6 12/28/2022 1835   BILITOT 0.5 03/17/2020 1002   GFRNONAA >60 12/28/2022 1835   GFRAA 112 03/17/2020 1002   Lab Results  Component Value Date   CHOL 203 (H) 05/24/2015   HDL 61 05/24/2015   LDLCALC 120 (H) 05/24/2015   TRIG 112 05/24/2015   CHOLHDL 3.3 05/24/2015   Lab Results  Component Value Date   HGBA1C 5.2 05/24/2015   Lab Results  Component Value Date   VITAMINB12 349 03/17/2020   Lab Results  Component Value Date   TSH 1.649 01/13/2015        ASSESSMENT AND PLAN  No diagnosis found.  1.   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 2.   I'll have her try Buspar for anxiety.   She will try to setup an appt with psychiatry. 3.   Stay active and exercise 4.   RTC 6 months, sooner if new or worsening issues.   Jennalee Greaves A. Epimenio Foot, MD, [facility] 03/11/2023, 7:14 PM Certified in Neurology, Clinical Neurophysiology, Sleep Medicine and Neuroimaging  [facility] [address], [address] [place], [place] [address] [phone]

## 2023-03-12 ENCOUNTER — Encounter: Payer: Self-pay | Admitting: Neurology

## 2023-03-12 ENCOUNTER — Ambulatory Visit (INDEPENDENT_AMBULATORY_CARE_PROVIDER_SITE_OTHER): Payer: Medicare HMO | Admitting: Neurology

## 2023-03-12 ENCOUNTER — Telehealth: Payer: Self-pay | Admitting: Neurology

## 2023-03-12 VITALS — BP 129/70 | HR 88 | Ht 66.0 in | Wt 134.0 lb

## 2023-03-12 DIAGNOSIS — Z79899 Other long term (current) drug therapy: Secondary | ICD-10-CM

## 2023-03-12 DIAGNOSIS — G35 Multiple sclerosis: Secondary | ICD-10-CM | POA: Diagnosis not present

## 2023-03-12 DIAGNOSIS — R208 Other disturbances of skin sensation: Secondary | ICD-10-CM

## 2023-03-12 DIAGNOSIS — F319 Bipolar disorder, unspecified: Secondary | ICD-10-CM

## 2023-03-12 DIAGNOSIS — F1911 Other psychoactive substance abuse, in remission: Secondary | ICD-10-CM | POA: Diagnosis not present

## 2023-03-12 DIAGNOSIS — R269 Unspecified abnormalities of gait and mobility: Secondary | ICD-10-CM

## 2023-03-12 MED ORDER — ALPRAZOLAM 1 MG PO TABS: ORAL_TABLET | ORAL | 0 refills | Status: DC

## 2023-03-12 MED ORDER — LAMOTRIGINE 25 MG PO TABS: ORAL_TABLET | ORAL | 5 refills | Status: DC

## 2023-03-12 NOTE — Telephone Encounter (Signed)
Francine Graven Berkley Harvey: [id number] exp. 03/12/23-04/11/23 sent to GI [phone]

## 2023-03-15 ENCOUNTER — Encounter: Payer: Self-pay | Admitting: Neurology

## 2023-03-20 ENCOUNTER — Telehealth: Payer: Self-pay | Admitting: *Deleted

## 2023-03-20 NOTE — Telephone Encounter (Signed)
Called and spoke w/ mother (on [place]). States daughter going today to see PCP Ladora Daniel, [place]) to give list of labs that Dr. Epimenio Foot wanted. Plans to go to hospital using ultrasound to collect labs. She took our fax# [phone] to have her fax results for Dr. Epimenio Foot to review once back.   Mother states Dr.Sater wanted to schedule an appt 2 weeks after labs drawn to review/clear her for [facility] therapy. Advised I will double check on this and let her know. Typically, he reviews lab results and then we can clear from there w/o pt coming for appt.

## 2023-03-21 NOTE — Telephone Encounter (Signed)
Per Dr. Epimenio Foot, pt only needs 3 month f/u after being seen 03/12/2023. Called mother. LVM asking her to call back.   Phone room, if she calls, please offer 06/10/23 at 2:00pm. Also, please ask if she was able to get labs drawn yesterday or if they have a date she will be getting them drawn

## 2023-03-27 NOTE — Telephone Encounter (Signed)
Called and spoke w/ mother. States daughter has been sick for past three days/stomach issues. Has MRI coming up and scope on Monday. Wants her to complete these tests first prior to going to get labs drawn that Dr Epimenio Foot wanted. She will call us back once she has her labs drawn.   Scheduled 3 month f/u for 06/10/23 at 2pm with Dr. Epimenio Foot.

## 2023-03-31 ENCOUNTER — Inpatient Hospital Stay: Admission: RE | Admit: 2023-03-31 | Payer: Medicare HMO | Source: Ambulatory Visit

## 2023-03-31 ENCOUNTER — Other Ambulatory Visit: Payer: Medicare HMO

## 2023-04-08 ENCOUNTER — Encounter (HOSPITAL_BASED_OUTPATIENT_CLINIC_OR_DEPARTMENT_OTHER): Payer: Medicare HMO | Admitting: Pulmonary Disease

## 2023-04-09 ENCOUNTER — Encounter (HOSPITAL_BASED_OUTPATIENT_CLINIC_OR_DEPARTMENT_OTHER): Payer: Medicare HMO | Admitting: Pulmonary Disease

## 2023-04-14 ENCOUNTER — Other Ambulatory Visit: Payer: Medicare HMO

## 2023-05-05 ENCOUNTER — Ambulatory Visit
Admission: RE | Admit: 2023-05-05 | Discharge: 2023-05-05 | Disposition: A | Discharge status: Home / Self Care | Payer: Medicare HMO | Source: Ambulatory Visit | Attending: Neurology | Admitting: Neurology

## 2023-05-05 DIAGNOSIS — G35 Multiple sclerosis: Secondary | ICD-10-CM

## 2023-06-08 NOTE — Progress Notes (Unsigned)
[facility]  PATIENT: Beth [name] DOB: 09-25-1981  REFERRING DOCTOR OR PCP:  Georgette Shell, PA-C SOURCE: Patient, notes from primary care, notes from [facility] neurology, imaging and lab reports, MRI images personally reviewed.  _________________________________   HISTORICAL  CHIEF COMPLAINT:  No chief complaint on file.   HISTORY OF PRESENT ILLNESS:  Beth [name] is a 42 y.o. woman with elapsing remitting multiple sclerosis.   Update 06/10/2023:  She was diagnosed with MS in 2001 after presenting with clumsiness and numbness.  I had seen her in 2021 after she presented with an exacerbation causing diplopia she had not been on a disease modifying therapy for couple years and Tecfidera was started but she took it for just a couple months..  In early 2024, she had the onset of right foot numbness and foot drop.  We had discussed options and she wished to go on Mavenclad.  However, she never had the lab work done (she prefers the phlebotomist at her primary care practice as she has very poor access)  She reports that her gait is off balance.   She will use a walker at times to walk when more tired.   Sshe sometimes feels she will pass out when she stands up after sitting or laying a while.    She notes a left foot drop.  She notes numbness in the bottom of her feet and the left anterolateral thigh.  She notes muscle spasms in her limbs and takes baclofen and cyclobenzaprine as needed   Bladder issues worsened in 2022/2023 but not associated with her discitis event.    She denies new issues with visual acuity though she often uses a magnifying glass and  has intermittent diplopia.  She needs magnifying .  Diplopia resolved.  She has fatigue.  She was briefly on modafinil but it did not help.  She notes reduced focus and attention and sometimes feels in a fog.  Sleep is variable - better with gabapentin and trazodone at nigh..  She has been diagnosed with bipolar  disease and personality disorder and anxiety. Seroquel, olanzapine, Risperdal and Abilify were poorly tolerated.   She was once on lamotrigine.   Buspar helps anxiety.    She reported that Zoloft made her more manic.  She sees Dr Tomasa Hose for psychiatry ([facility]) she had a hospital admission  She developed discitis at T7-T8 in 2022 and was placed on IV antibiotics with resolution.   She has a substance abuse problem (heroin and cocaine).  She had quit using for multiple months in 2024 but then had an emergency room visit on 04/29/2023 for polysubstance abuse.  MS History: She was diagnosed with MS in 2001.   At that time, at age 71, she had numbness and reduced dexterity in the hands causing reduced typing. Her right hand sometimes jerked and she had slurred speech.   An MRI was consistent with MS and she had IV steroids.   She had an ulcer develop.  She was started on Rebif.   Due to side effects she stopped.   She had a few exacerbations over the years. She had a lot going on with her sister committing suicide and she becoming an alcoholic.  For many years she did not see any neurologist . Around 2014, she started to have more difficulty with balance and gait .  She had a lumbar fracture after a fall in 2016.   Repeat imaging was performed and she was referred to  Dr. Gaynelle Adu at [facility].   She was placed on Tecfidera but stopped due to hair loss and flushing.  She stopped in 2018.  She developed diplopia spring 2021 I had seen her in early 2021 and we tried to get her back on Tecfidera.  She had a relapse in early 2024 with right foot numbness and weakness.  We had discussed Mavenclad and she wished to proceed but wanted blood work to be done at her primary care office (has poor access and wanted a known phlebotomist).  She had dehydration a couple times have never got the lab work done.  Other pertinent History: She has a h/o gastric bypass in 2002.    She lost a lot of weight and gained  back quite a bit.  Over the last 2 years, she has lost her weight (on Topamax at the time)  but is steady at 130 pounds  She has chronic Hepatitis C and has an appointment to see GI soon.  She is currently on methadone 120 mg daily for pain but also had an opiate drug addiction in the past.  She does have degenerative changes in her lumbar spine causing back and leg pain.  She had right hemilaminectomy at L4-L5 due to a disc herniation.  IMAGING REVIEW MRI of the brain dated 09/16/2015.  It shows T2/FLAIR hyperintense foci in the cerebellar hemisphere, left medulla, and in the periventricular and juxtacortical white matter.  None of the foci enhanced.  MRI of the cervical spine showed T2 hyperintense foci to the right at C1-C2, posteriorly at C2, small focus to the left of C5 and anteriorly to the right at C5-C6.  MRI brain 06/02/2020 showed Multiple T2/FLAIR hyperintense foci in the hemispheres, cerebellum and medulla in a pattern and configuration consistent with chronic demyelinating plaque associated with multiple sclerosis.  None of the foci appear to be acute and they do not enhance.  Compared to the MRI dated 09/16/2015, there are no new lesions.       MRI cervical spine 06/02/2020 showed several T2 hyperintense foci within the spinal cord   There are multiple T2 hyperintense foci within the spinal cord located towards the right adjacent to C1-C2, posteriorly adjacent to C1-C2, anteriorly adjacent to C5 and laterally to the left adjacent to C5-C6. None of these foci enhance. They were all present on the MRI from 2016. consistent with stable chronic demyelinating plaque associated with multiple sclerosis.  Mild disc degenerative changes at C4-C5 and C5-C6 that do not lead to spinal stenosis or nerve root compression.  The degenerative changes have progressed compared to the previous MR.  MRI of the brain 05/05/2023 showed no new lesions compared to 06/02/2020.  MRI of the cervical spine 05/05/2023 showed T2  hyperintense foci within the spinal cord towards the right adjacent to C1-C2, posterolaterally to the left adjacent to C2, posteriorly adjacent to C3-C4, anteriorly adjacent to C5, and towards the left adjacent to C5-C6. These are consistent with chronic demyelinating plaque associated with multiple sclerosis. All of the foci were present on the MRI from 06/02/2020 and appear unchanged.   Labs: Vitamin B12 and copper were normal 03/17/2020  REVIEW OF SYSTEMS: Constitutional: No fevers, chills, sweats, or change in appetite.  She has fatigue. Eyes: No visual changes, double vision, eye pain Ear, nose and throat: No hearing loss, ear pain, nasal congestion, sore throat Cardiovascular: No chest pain, palpitations Respiratory:  No shortness of breath at rest or with exertion.   No wheezes  GastrointestinaI: No nausea, vomiting, diarrhea, abdominal pain, fecal incontinence.  She has asymptomatic chronic hepatitis C Genitourinary:  No dysuria, urinary retention or frequency.  No nocturia. Musculoskeletal: She reports lower back and leg pain. Integumentary: No rash, pruritus, skin lesions Neurological: as above Psychiatric: No depression at this time.  No anxiety Endocrine: No palpitations, diaphoresis, change in appetite, change in weigh or increased thirst Hematologic/Lymphatic:  No anemia, purpura, petechiae. Allergic/Immunologic: No itchy/runny eyes, nasal congestion, recent allergic reactions, rashes  ALLERGIES: Allergies  Allergen Reactions   Cymbalta [Duloxetine Hcl] Other (See Comments)    Made body jerk and twitch, spasms   Dilaudid [Hydromorphone Hcl] Other (See Comments)    Anger, aggression    Naltrexone Swelling   Nsaids Nausea And Vomiting   Zolpidem Other (See Comments)    Sleep walking   Abilify [Aripiprazole] Itching    Clenching of teeth, Grinds teeth   Meperidine     Mental Status Changes, Agitation, swearing   Prednisone Other (See Comments)    Steroids in general     HOME MEDICATIONS:  Current Outpatient Medications:    ALPRAZolam (XANAX) 1 MG tablet, 1-2 tabs 30 mins before MRI and 1 extra at the time of study if needed., Disp: 3 tablet, Rfl: 0   baclofen (LIORESAL) 10 MG tablet, Take 10 mg by mouth 3 (three) times daily as needed., Disp: , Rfl:    Buprenorphine HCl-Naloxone HCl 8-2 MG FILM, Place 8 mg of opioid under the tongue daily. (Patient not taking: Reported on 12/18/2022), Disp: , Rfl:    busPIRone (BUSPAR) 15 MG tablet, Take 1 tablet (15 mg total) by mouth 2 (two) times daily. (Patient taking differently: Take 30 mg by mouth 2 (two) times daily as needed.), Disp: 60 tablet, Rfl: 5   clindamycin (CLEOCIN T) 1 % external solution, Apply 1 Application topically 2 (two) times daily., Disp: , Rfl:    cyclobenzaprine (FLEXERIL) 10 MG tablet, Take 10 mg by mouth 3 (three) times daily as needed for muscle spasms., Disp: , Rfl:    ferrous sulfate 325 (65 FE) MG tablet, Take 325 mg by mouth every other day., Disp: , Rfl:    gabapentin (NEURONTIN) 600 MG tablet, Take 600 mg by mouth 3 (three) times daily as needed., Disp: , Rfl:    hydrOXYzine (ATARAX) 50 MG tablet, Take 50 mg by mouth in the morning, at noon, in the evening, and at bedtime., Disp: , Rfl:    lamoTRIgine (LAMICTAL) 25 MG tablet, For 5 days, take 1 pill po qd, for next 5 days take 1 pill po bid, for next 5 days, take 1 pill in am and 2 po qhs, then take 2 pills po bid, Disp: 120 tablet, Rfl: 5   linaclotide (LINZESS) 145 MCG CAPS capsule, Take 72 mcg by mouth daily before breakfast., Disp: , Rfl:    Lurasidone HCl (LATUDA) 60 MG TABS, Take 80 mg by mouth daily at 6 (six) AM., Disp: , Rfl:    norethindrone (AYGESTIN) 5 MG tablet, Take 5 mg by mouth daily. (Patient not taking: Reported on 12/18/2022), Disp: , Rfl:    omeprazole (PRILOSEC) 20 MG capsule, Take 20 mg by mouth daily. (Patient not taking: Reported on 03/12/2023), Disp: , Rfl:    ondansetron (ZOFRAN) 4 MG tablet, Take 4 mg by mouth  every 4 (four) hours as needed for nausea or vomiting. (Patient not taking: Reported on 03/12/2023), Disp: , Rfl:    rizatriptan (MAXALT-MLT) 10 MG disintegrating tablet, Take 10 mg by  mouth as needed., Disp: , Rfl:    traZODone (DESYREL) 100 MG tablet, Take 100 mg by mouth at bedtime as needed. 30 minutes prior to HS, Disp: , Rfl:    valACYclovir (VALTREX) 500 MG tablet, Take 500 mg by mouth daily as needed., Disp: , Rfl:   PAST MEDICAL HISTORY: Past Medical History:  Diagnosis Date   Acid reflux    Anxiety    Asthma    Chronic bipolar disorder (HCC)    2024   Chronic diarrhea    self reported   Chronic nausea    self reported   Chronic pain    self reported chronic pain, back pain, fibromyalgia   Delirium tremens (HCC)    Depression    ETOH abuse    Gastric ulcer    History of blood transfusion    Insomnia    Migraine    Multiple sclerosis (HCC) 2005   diagnosed in [place], failed steroids, no other medication   Polysubstance abuse (HCC)    Substance abuse (HCC)     PAST SURGICAL HISTORY: Past Surgical History:  Procedure Laterality Date   BACK SURGERY     2022   DG TEETH FULL Bilateral    2024   DIAGNOSTIC LAPAROSCOPY  10/29/2001   repair of perforation at [facility] anastamosis    GASTRIC BYPASS     LAPAROTOMY N/A 08/18/2014   Procedure: EXPLORATORY LAPAROTOMY, Patch graft of gastric perforation;  Surgeon: Abigail Miyamoto, MD;  Location: [facility] OR;  Service: General;  Laterality: N/A;   LUMBAR LAMINECTOMY/DECOMPRESSION MICRODISCECTOMY Right 04/14/2014   Procedure: LUMBAR LAMINECTOMY/DECOMPRESSION MICRODISCECTOMY LEVEL L4-5;  Surgeon: Maeola Harman, MD;  Location: [facility];  Service: Neurosurgery;  Laterality: Right;  LUMBAR LAMINECTOMY/DECOMPRESSION MICRODISCECTOMY LEVEL L4-5    FAMILY HISTORY: Family History  Problem Relation Age of Onset   Hypertension Paternal Grandfather    Colon cancer Paternal Grandfather 78   Asthma Paternal Grandmother    Hypertension Paternal  Grandmother    Breast cancer Paternal Grandmother    Cancer Paternal Grandmother        lung    Diabetes Maternal Grandmother    Diabetes Maternal Grandfather    Hypertension Father    Alcohol abuse Father    Urinary tract infection Mother    Stroke Mother 11       3 strokes     SOCIAL HISTORY:  Social History   Socioeconomic History   Marital status: Single    Spouse name: Alice   Number of children: 0   Years of education: 12   Highest education level: Not on file  Occupational History   Occupation: disability  Tobacco Use   Smoking status: Former    Current packs/day: 0.00    Average packs/day: 2.0 packs/day for 27.0 years (54.0 ttl pk-yrs)    Types: Cigarettes    Start date: 09/29/1995    Quit date: 09/28/2022    Years since quitting: 0.6   Smokeless tobacco: Never  Vaping Use   Vaping status: Every Day  Substance and Sexual Activity   Alcohol use: No    Alcohol/week: 0.0 standard drinks of alcohol    Comment: patient states she does not drink any more.   Drug use: Not Currently    Comment: pt has been drug free for 2 1/2 months   Sexual activity: Yes    Birth control/protection: None, Condom  Other Topics Concern   Not on file  Social History Narrative   Lives with mother, F[place]on Mole  Caffeine use: soda (3-4 per day)   No coffee      Right handed    Social Determinants of Health   Financial Resource Strain: Medium Risk (11/02/2022)   Received from [facility], [facility]   Overall Financial Resource Strain (CARDIA)    Difficulty of Paying Living Expenses: Somewhat hard  Food Insecurity: No Food Insecurity (11/02/2022)   Received from [facility], [facility]   Hunger Vital Sign    Worried About Running Out of Food in the Last Year: Never true    Ran Out of Food in the Last Year: Never true  Transportation Needs: No Transportation Needs (11/02/2022)   Received from [facility], [facility]   PRAPARE - Transportation    Lack of Transportation  (Medical): No    Lack of Transportation (Non-Medical): No  Physical Activity: Insufficiently Active (07/04/2021)   Received from [facility], [facility]   Exercise Vital Sign    Days of Exercise per Week: 3 days    Minutes of Exercise per Session: 30 min  Stress: Stress Concern Present (07/04/2021)   Received from [facility], [facility]  [facility] - Occupational Stress Questionnaire    Feeling of Stress : Rather much  Social Connections: Unknown (03/01/2022)   Received from [facility], [facility]   Social Network    Social Network: Not on file  Intimate Partner Violence: Not At Risk (04/29/2023)   Received from [facility], [facility]   HITS    Over the last 12 months how often did your partner physically hurt you?: 1    Over the last 12 months how often did your partner insult you or talk down to you?: 1    Over the last 12 months how often did your partner threaten you with physical harm?: 1    Over the last 12 months how often did your partner scream or curse at you?: 1     PHYSICAL EXAM  There were no vitals filed for this visit.  OD 20/50 OS 20/40  There is no height or weight on file to calculate BMI.  No results found.   General: The patient is well-developed and well-nourished and in no acute distress.   Carotid ateries without bruits, RRR normal S1, S2, no M, G, R.    HEENT:  Head is [place]/AT.  Sclera are anicteric.    Skin/Ext: Extremities are without rash or edema.      Neurologic Exam  Mental status: The patient is alert and oriented x 3 at the time of the examination. The patient has apparent normal recent and remote memory, with an apparently normal attention span and concentration ability.   Normal palatal elevatin, tongue protrusion.  No dysarthriaSpeech is normal.  Cranial nerves: Extraocular movements are full.  Color vision was reduced on the left.  Facial strength is normal. No obvious hearing deficits are  noted.  Motor:  Muscle bulk is normal.   Tone is normal. Strength is  5 / 5 in all 4 extremities.   Sensory: Sensory testing is intact to touch and vibration sensation in arms but allodynia in feet, left >>right and left anterolateral thigh (associated with some reduced touch)  Coordination: Cerebellar testing reveals very slight reduced good finger-nose-finger and mild reduced heel-to-shin bilaterally.  Gait and station: Station is normal.   Gait is mildly wide.  She has mildly wide tandem gait.. Romberg is negative.   Reflexes: Deep tendon reflexes are symmetric and  normal in arms, 3+ (spread) at knees and 3 at ankles (no clonus).   Plantar responses are flexor.    DIAGNOSTIC DATA (LABS, IMAGING, TESTING) - I reviewed patient records, labs, notes, testing and imaging myself where available.  Lab Results  Component Value Date   WBC 6.9 12/28/2022   HGB 12.6 12/28/2022   HCT 37.0 12/28/2022   MCV 75.2 (L) 12/28/2022   PLT 365 12/28/2022      Component Value Date/Time   NA 144 12/28/2022 1841   NA 145 (H) 03/17/2020 1002   K 3.8 12/28/2022 1841   CL 107 12/28/2022 1835   CO2 22 12/28/2022 1835   GLUCOSE 102 (H) 12/28/2022 1835   BUN 12 12/28/2022 1835   BUN 8 03/17/2020 1002   CREATININE 1.05 (H) 12/28/2022 1835   CALCIUM 9.6 12/28/2022 1835   PROT 7.8 12/28/2022 1835   PROT 7.4 03/17/2020 1002   ALBUMIN 3.7 12/28/2022 1835   ALBUMIN 4.3 03/17/2020 1002   AST 18 12/28/2022 1835   ALT 9 12/28/2022 1835   ALKPHOS 62 12/28/2022 1835   BILITOT 0.6 12/28/2022 1835   BILITOT 0.5 03/17/2020 1002   GFRNONAA >60 12/28/2022 1835   GFRAA 112 03/17/2020 1002   Lab Results  Component Value Date   CHOL 203 (H) 05/24/2015   HDL 61 05/24/2015   LDLCALC 120 (H) 05/24/2015   TRIG 112 05/24/2015   CHOLHDL 3.3 05/24/2015   Lab Results  Component Value Date   HGBA1C 5.2 05/24/2015   Lab Results  Component Value Date   VITAMINB12 349 03/17/2020   Lab Results  Component  Value Date   TSH 1.649 01/13/2015       ASSESSMENT AND PLAN  No diagnosis found.   In summary, Ms. Sergeant is a 42 year old woman with a long history of multiple sclerosis, bipolar disease and substance abuse.  She had a clear-cut MS exacerbation in 2021 and a probable MS exacerbation earlier this year.  1.   We will check an MRI of the brain and cervical spine to better assess the extent of the breakthrough activity.  She is agreeable to starting a disease modifying therapy.  Due to her history of substance abuse (heroin) we will avoid a self injectable medication.  She has poor venous access making IV medications difficult.  We discussed the oral options.  Mavenclad would give her a good combination of strength and tolerability.  She had tolerability issues with Tecfidera in the past.  We will check some lab work. 2.   To help with the dysesthesias, I will add lamotrigine.  This may also help as a mood stabilizer.   3.   Stay active and exercise 4.   RTC 3 months, sooner if new or worsening issues.  We discussed that she will need blood work about 2 months after she starts the [place] and again 4 months later.  60-minute office visit with the majority of the time spent face-to-face for history and physical, discussion/counseling and decision-making.  Additional time with record review and documentation.   Jeffery Bachmeier A. Epimenio Foot, MD, [facility] 06/08/2023, 7:48 PM Certified in Neurology, Clinical Neurophysiology, Sleep Medicine and Neuroimaging  [facility] [address], [address] [place], [place] [address] [phone]

## 2023-06-10 ENCOUNTER — Ambulatory Visit (INDEPENDENT_AMBULATORY_CARE_PROVIDER_SITE_OTHER): Payer: Self-pay | Admitting: Neurology

## 2023-06-10 ENCOUNTER — Encounter: Payer: Self-pay | Admitting: Neurology

## 2023-06-10 VITALS — Ht 66.0 in | Wt 138.0 lb

## 2023-06-10 DIAGNOSIS — G35 Multiple sclerosis: Secondary | ICD-10-CM

## 2023-07-16 ENCOUNTER — Encounter (HOSPITAL_COMMUNITY): Payer: Self-pay

## 2023-07-16 ENCOUNTER — Other Ambulatory Visit: Payer: Self-pay

## 2023-07-16 ENCOUNTER — Emergency Department (HOSPITAL_COMMUNITY): Payer: Medicare HMO

## 2023-07-16 ENCOUNTER — Inpatient Hospital Stay (HOSPITAL_COMMUNITY)
Admission: EM | Admit: 2023-07-16 | Discharge: 2023-07-18 | DRG: 917 | Discharge status: Against Medical Advice | Payer: Medicare HMO | Attending: Internal Medicine | Admitting: Internal Medicine

## 2023-07-16 DIAGNOSIS — M797 Fibromyalgia: Secondary | ICD-10-CM | POA: Diagnosis present

## 2023-07-16 DIAGNOSIS — D509 Iron deficiency anemia, unspecified: Secondary | ICD-10-CM | POA: Diagnosis present

## 2023-07-16 DIAGNOSIS — Z823 Family history of stroke: Secondary | ICD-10-CM

## 2023-07-16 DIAGNOSIS — E538 Deficiency of other specified B group vitamins: Secondary | ICD-10-CM | POA: Diagnosis present

## 2023-07-16 DIAGNOSIS — Z825 Family history of asthma and other chronic lower respiratory diseases: Secondary | ICD-10-CM

## 2023-07-16 DIAGNOSIS — F17290 Nicotine dependence, other tobacco product, uncomplicated: Secondary | ICD-10-CM | POA: Diagnosis present

## 2023-07-16 DIAGNOSIS — J9601 Acute respiratory failure with hypoxia: Secondary | ICD-10-CM | POA: Diagnosis present

## 2023-07-16 DIAGNOSIS — R0902 Hypoxemia: Secondary | ICD-10-CM

## 2023-07-16 DIAGNOSIS — T401X1A Poisoning by heroin, accidental (unintentional), initial encounter: Secondary | ICD-10-CM | POA: Diagnosis not present

## 2023-07-16 DIAGNOSIS — D75839 Thrombocytosis, unspecified: Secondary | ICD-10-CM | POA: Diagnosis present

## 2023-07-16 DIAGNOSIS — J690 Pneumonitis due to inhalation of food and vomit: Secondary | ICD-10-CM | POA: Diagnosis present

## 2023-07-16 DIAGNOSIS — Z9884 Bariatric surgery status: Secondary | ICD-10-CM

## 2023-07-16 DIAGNOSIS — G35 Multiple sclerosis: Secondary | ICD-10-CM | POA: Diagnosis present

## 2023-07-16 DIAGNOSIS — T40601A Poisoning by unspecified narcotics, accidental (unintentional), initial encounter: Secondary | ICD-10-CM | POA: Diagnosis present

## 2023-07-16 DIAGNOSIS — F339 Major depressive disorder, recurrent, unspecified: Secondary | ICD-10-CM | POA: Diagnosis present

## 2023-07-16 DIAGNOSIS — R8271 Bacteriuria: Secondary | ICD-10-CM | POA: Diagnosis present

## 2023-07-16 DIAGNOSIS — Z720 Tobacco use: Secondary | ICD-10-CM | POA: Diagnosis present

## 2023-07-16 DIAGNOSIS — Z8711 Personal history of peptic ulcer disease: Secondary | ICD-10-CM

## 2023-07-16 DIAGNOSIS — F419 Anxiety disorder, unspecified: Secondary | ICD-10-CM | POA: Diagnosis present

## 2023-07-16 DIAGNOSIS — K219 Gastro-esophageal reflux disease without esophagitis: Secondary | ICD-10-CM | POA: Diagnosis present

## 2023-07-16 DIAGNOSIS — Z886 Allergy status to analgesic agent status: Secondary | ICD-10-CM

## 2023-07-16 DIAGNOSIS — G8929 Other chronic pain: Secondary | ICD-10-CM | POA: Diagnosis present

## 2023-07-16 DIAGNOSIS — Z6822 Body mass index (BMI) 22.0-22.9, adult: Secondary | ICD-10-CM

## 2023-07-16 DIAGNOSIS — Z833 Family history of diabetes mellitus: Secondary | ICD-10-CM

## 2023-07-16 DIAGNOSIS — Z888 Allergy status to other drugs, medicaments and biological substances status: Secondary | ICD-10-CM

## 2023-07-16 DIAGNOSIS — Z79899 Other long term (current) drug therapy: Secondary | ICD-10-CM

## 2023-07-16 DIAGNOSIS — F319 Bipolar disorder, unspecified: Secondary | ICD-10-CM | POA: Diagnosis present

## 2023-07-16 DIAGNOSIS — Z818 Family history of other mental and behavioral disorders: Secondary | ICD-10-CM

## 2023-07-16 DIAGNOSIS — Z8249 Family history of ischemic heart disease and other diseases of the circulatory system: Secondary | ICD-10-CM

## 2023-07-16 DIAGNOSIS — F1129 Opioid dependence with unspecified opioid-induced disorder: Secondary | ICD-10-CM | POA: Diagnosis present

## 2023-07-16 DIAGNOSIS — Z803 Family history of malignant neoplasm of breast: Secondary | ICD-10-CM

## 2023-07-16 DIAGNOSIS — R0781 Pleurodynia: Secondary | ICD-10-CM | POA: Diagnosis present

## 2023-07-16 DIAGNOSIS — Z800 Family history of malignant neoplasm of digestive organs: Secondary | ICD-10-CM

## 2023-07-16 DIAGNOSIS — D72829 Elevated white blood cell count, unspecified: Secondary | ICD-10-CM | POA: Diagnosis present

## 2023-07-16 DIAGNOSIS — Z5986 Financial insecurity: Secondary | ICD-10-CM

## 2023-07-16 DIAGNOSIS — Z885 Allergy status to narcotic agent status: Secondary | ICD-10-CM

## 2023-07-16 DIAGNOSIS — I959 Hypotension, unspecified: Secondary | ICD-10-CM | POA: Diagnosis present

## 2023-07-16 DIAGNOSIS — Z5329 Procedure and treatment not carried out because of patient's decision for other reasons: Secondary | ICD-10-CM | POA: Diagnosis present

## 2023-07-16 DIAGNOSIS — Z801 Family history of malignant neoplasm of trachea, bronchus and lung: Secondary | ICD-10-CM

## 2023-07-16 DIAGNOSIS — E441 Mild protein-calorie malnutrition: Secondary | ICD-10-CM | POA: Diagnosis present

## 2023-07-16 DIAGNOSIS — E871 Hypo-osmolality and hyponatremia: Secondary | ICD-10-CM | POA: Diagnosis present

## 2023-07-16 DIAGNOSIS — Z811 Family history of alcohol abuse and dependence: Secondary | ICD-10-CM

## 2023-07-16 DIAGNOSIS — Z1152 Encounter for screening for COVID-19: Secondary | ICD-10-CM

## 2023-07-16 DIAGNOSIS — Z635 Disruption of family by separation and divorce: Secondary | ICD-10-CM

## 2023-07-16 LAB — CBC WITH DIFFERENTIAL/PLATELET
Abs Immature Granulocytes: 0.07 10*3/uL (ref 0.00–0.07)
Basophils Absolute: 0 10*3/uL (ref 0.0–0.1)
Basophils Relative: 0 %
Eosinophils Absolute: 0 10*3/uL (ref 0.0–0.5)
Eosinophils Relative: 0 %
HCT: 29.5 % — ABNORMAL LOW (ref 36.0–46.0)
Hemoglobin: 8.7 g/dL — ABNORMAL LOW (ref 12.0–15.0)
Immature Granulocytes: 1 %
Lymphocytes Relative: 5 %
Lymphs Abs: 0.6 10*3/uL — ABNORMAL LOW (ref 0.7–4.0)
MCH: 20.5 pg — ABNORMAL LOW (ref 26.0–34.0)
MCHC: 29.5 g/dL — ABNORMAL LOW (ref 30.0–36.0)
MCV: 69.6 fL — ABNORMAL LOW (ref 80.0–100.0)
Monocytes Absolute: 0.8 10*3/uL (ref 0.1–1.0)
Monocytes Relative: 7 %
Neutro Abs: 10.1 10*3/uL — ABNORMAL HIGH (ref 1.7–7.7)
Neutrophils Relative %: 87 %
Platelets: 496 10*3/uL — ABNORMAL HIGH (ref 150–400)
RBC: 4.24 MIL/uL (ref 3.87–5.11)
RDW: 15.7 % — ABNORMAL HIGH (ref 11.5–15.5)
WBC: 11.6 10*3/uL — ABNORMAL HIGH (ref 4.0–10.5)
nRBC: 0 % (ref 0.0–0.2)

## 2023-07-16 LAB — COMPREHENSIVE METABOLIC PANEL
ALT: 15 U/L (ref 0–44)
AST: 20 U/L (ref 15–41)
Albumin: 3.4 g/dL — ABNORMAL LOW (ref 3.5–5.0)
Alkaline Phosphatase: 82 U/L (ref 38–126)
Anion gap: 10 (ref 5–15)
BUN: 13 mg/dL (ref 6–20)
CO2: 25 mmol/L (ref 22–32)
Calcium: 8.7 mg/dL — ABNORMAL LOW (ref 8.9–10.3)
Chloride: 99 mmol/L (ref 98–111)
Creatinine, Ser: 1.18 mg/dL — ABNORMAL HIGH (ref 0.44–1.00)
GFR, Estimated: 59 mL/min — ABNORMAL LOW (ref >60)
Glucose, Bld: 70 mg/dL (ref 70–99)
Potassium: 4.2 mmol/L (ref 3.5–5.1)
Sodium: 134 mmol/L — ABNORMAL LOW (ref 135–145)
Total Bilirubin: 0.1 mg/dL — ABNORMAL LOW (ref 0.3–1.2)
Total Protein: 7.5 g/dL (ref 6.5–8.1)

## 2023-07-16 LAB — ETHANOL: Alcohol, Ethyl (B): <10 mg/dL (ref <10)

## 2023-07-16 LAB — RESP PANEL BY RT-PCR (RSV, FLU A&B, COVID)  RVPGX2
Influenza A by PCR: NEGATIVE
Influenza B by PCR: NEGATIVE
Resp Syncytial Virus by PCR: NEGATIVE
SARS Coronavirus 2 by RT PCR: NEGATIVE

## 2023-07-16 MED ORDER — ACETAMINOPHEN 500 MG PO TABS
1000.0000 mg | ORAL_TABLET | Freq: Once | ORAL | Status: AC
  Administered 2023-07-16: 1000 mg via ORAL
  Filled 2023-07-16: qty 2

## 2023-07-16 MED ORDER — NALOXONE HCL 0.4 MG/ML IJ SOLN
0.4000 mg | INTRAMUSCULAR | Status: DC | PRN
  Administered 2023-07-18: 0.4 mg via INTRAVENOUS
  Filled 2023-07-16: qty 1

## 2023-07-16 MED ORDER — SODIUM CHLORIDE 0.9 % IV BOLUS
1000.0000 mL | Freq: Once | INTRAVENOUS | Status: AC
  Administered 2023-07-16: 1000 mL via INTRAVENOUS

## 2023-07-16 NOTE — ED Notes (Signed)
This RN attempted to draw patient's labs x1 with no success. Patient's refusing to allow this RN to try to obtain labs. Notified ED provider.

## 2023-07-16 NOTE — ED Provider Notes (Signed)
[place] EMERGENCY DEPARTMENT AT [facility] Provider Note   CSN: [id number] Arrival date & time: 07/16/23  1927     History Chief Complaint  Patient presents with   Drug Overdose    Beth [name] is a 42 y.o. female.  Patient presents to the emergency department following a drug overdose.  Patient was brought in by coworker and EMS after being picked up at a [employer] parking lot at approximately 6:40 PM this evening.  Patient given 0.8 mg Narcan IM by friend prior to EMS arrival.  Patient was also called out by EMS over this afternoon approximately 4 PM when found unconscious.  Given 1.5 mg of Narcan at that time but refused transport to the hospital.  Patient did reportedly have a fall and struck the back of her head and reports that she had a knot in the back of her head at that time.  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  Denies any other substance use or alcohol use.  Also feels that she is having chills and bodyaches possible viral process.   Drug Overdose       Home Medications Prior to Admission medications   Medication Sig Start Date End Date Taking? Authorizing Provider  ALPRAZolam (XANAX) 1 MG tablet 1-2 tabs 30 mins before MRI and 1 extra at the time of study if needed. 03/12/23   Sater, Pearletha Furl, MD  baclofen (LIORESAL) 10 MG tablet Take 10 mg by mouth 3 (three) times daily as needed. 11/02/22   [provider]  Buprenorphine HCl-Naloxone HCl 8-2 MG FILM Place 8 mg of opioid under the tongue daily. Patient not taking: Reported on 12/18/2022 12/17/22   [provider]  busPIRone (BUSPAR) 15 MG tablet Take 1 tablet (15 mg total) by mouth 2 (two) times daily. Patient taking differently: Take 30 mg by mouth 2 (two) times daily as needed. 07/18/20   Sater, Pearletha Furl, MD  clindamycin (CLEOCIN T) 1 % external solution Apply 1 Application topically 2 (two) times daily.    [provider]  cyclobenzaprine  (FLEXERIL) 10 MG tablet Take 10 mg by mouth 3 (three) times daily as needed for muscle spasms.    [provider]  ferrous sulfate 325 (65 FE) MG tablet Take 325 mg by mouth every other day. Patient not taking: Reported on 06/10/2023 08/17/21   [provider]  gabapentin (NEURONTIN) 600 MG tablet Take 600 mg by mouth 3 (three) times daily as needed.    [provider]  hydrOXYzine (ATARAX) 50 MG tablet Take 50 mg by mouth in the morning, at noon, in the evening, and at bedtime.    [provider]  lamoTRIgine (LAMICTAL) 25 MG tablet For 5 days, take 1 pill po qd, for next 5 days take 1 pill po bid, for next 5 days, take 1 pill in am and 2 po qhs, then take 2 pills po bid 03/12/23   Sater, Pearletha Furl, MD  linaclotide (LINZESS) 145 MCG CAPS capsule Take 72 mcg by mouth daily before breakfast.    [provider]  Lurasidone HCl (LATUDA) 60 MG TABS Take 80 mg by mouth daily at 6 (six) AM.    [provider]  norethindrone (AYGESTIN) 5 MG tablet Take 5 mg by mouth daily. Patient not taking: Reported on 12/18/2022    [provider]  omeprazole (PRILOSEC) 20 MG capsule Take 20 mg by mouth daily. Patient not taking: Reported on  03/12/2023    [provider]  ondansetron (ZOFRAN) 4 MG tablet Take 4 mg by mouth every 4 (four) hours as needed for nausea or vomiting. Patient not taking: Reported on 03/12/2023    [provider]  rizatriptan (MAXALT-MLT) 10 MG disintegrating tablet Take 10 mg by mouth as needed. 11/02/22   [provider]  traZODone (DESYREL) 100 MG tablet Take 100 mg by mouth at bedtime as needed. 30 minutes prior to HS 07/04/22   [provider]  valACYclovir (VALTREX) 500 MG tablet Take 500 mg by mouth daily as needed.    [provider]      Allergies    Cymbalta [duloxetine hcl], Dilaudid [hydromorphone hcl], Naltrexone, Nsaids, Zolpidem, Abilify [aripiprazole], Meperidine, and Prednisone     Review of Systems   Review of Systems  Neurological:  Positive for syncope.  Psychiatric/Behavioral:  The patient is nervous/anxious.   All other systems reviewed and are negative.   Physical Exam Updated Vital Signs BP 109/87 (BP Location: Right Arm)   Pulse (!) 105   Temp 98.3 F (36.8 C) (Oral)   Resp 20   Ht 5\' 6"  (1.676 m)   Wt 63.5 kg   LMP 07/02/2023 (Exact Date)   SpO2 98%   BMI 22.60 kg/m  Physical Exam Vitals and nursing note reviewed.  Constitutional:      General: She is not in acute distress.    Appearance: She is well-developed.  HENT:     Head: Normocephalic and atraumatic.  Eyes:     General: No scleral icterus.    Extraocular Movements: Extraocular movements intact.     Conjunctiva/sclera: Conjunctivae normal.     Pupils: Pupils are equal, round, and reactive to light.  Cardiovascular:     Rate and Rhythm: Regular rhythm. Tachycardia present.     Heart sounds: No murmur heard. Pulmonary:     Effort: Pulmonary effort is normal. No respiratory distress.     Breath sounds: Rhonchi present.  Abdominal:     Palpations: Abdomen is soft.     Tenderness: There is no abdominal tenderness.  Musculoskeletal:        General: No swelling.     Cervical back: Neck supple.  Skin:    General: Skin is warm and dry.     Capillary Refill: Capillary refill takes less than 2 seconds.          Comments: Multiple injection sites noted to left thigh and bilateral arms. No obvious cellulitis or drainage present.  Neurological:     Mental Status: She is alert.  Psychiatric:        Mood and Affect: Mood normal.     ED Results / Procedures / Treatments   Labs (all labs ordered are listed, but only abnormal results are displayed) Labs Reviewed  RESP PANEL BY RT-PCR (RSV, FLU A&B, COVID)  [id number]  CBC WITH DIFFERENTIAL/PLATELET  COMPREHENSIVE METABOLIC PANEL  URINALYSIS, ROUTINE W REFLEX MICROSCOPIC  RAPID URINE DRUG SCREEN, HOSP PERFORMED  ETHANOL     EKG EKG Interpretation Date/Time:  Tuesday July 16 2023 20:17:01 EDT Ventricular Rate:  110 PR Interval:  154 QRS Duration:  88 QT Interval:  339 QTC Calculation: 459 R Axis:   91  Text Interpretation: Sinus tachycardia Biatrial enlargement Borderline right axis deviation RSR' in V1 or V2, probably normal variant when compared to prior, faster rate. No STEMI Confirmed by Theda Belfast ([id number]) on 07/16/2023 9:04:17 PM  Radiology CT Head Wo Contrast  Result Date: 07/16/2023  CLINICAL DATA:  Patient arrives with [facility] after being picked up from [employer] parking lot at 1840 this evening. Patient was given 0.8 mg Narcan IM by friend prior to EMS arrival. EMS was called out earlier this afternoon around 1600; patient was unconscious then, given 1.5 mg Narcan from Fire. Friend was performing CPR prior to Fire arrival. Per EMS, patient fell out of vehicle during the earlier call and "has knot on back of head. EXAM: CT HEAD WITHOUT CONTRAST TECHNIQUE: Contiguous axial images were obtained from the base of the skull through the vertex without intravenous contrast. RADIATION DOSE REDUCTION: This exam was performed according to the departmental dose-optimization program which includes automated exposure control, adjustment of the mA and/or kV according to patient size and/or use of iterative reconstruction technique. COMPARISON:  None Available. FINDINGS: Brain: No intracranial hemorrhage, mass effect, or evidence of acute infarct. No hydrocephalus. No extra-axial fluid collection. Vascular: No hyperdense vessel or unexpected calcification. Skull: No fracture or focal lesion.  Posterior scalp contusion. Sinuses/Orbits: No acute finding. Other: None. IMPRESSION: No acute intracranial abnormality. Posterior scalp contusion.  No calvarial fracture. Electronically Signed   By: Minerva Fester M.D.   On: 07/16/2023 21:50   DG Chest Portable 1 View  Result Date: 07/16/2023 CLINICAL DATA:  Recent  overdose EXAM: PORTABLE CHEST 1 VIEW COMPARISON:  10/27/2015 FINDINGS: Cardiac shadow is within normal limits. Lungs are well aerated bilaterally. Minimal basilar atelectasis is noted. No bony abnormality is seen. IMPRESSION: Mild basilar atelectasis. Electronically Signed   By: Alcide Clever M.D.   On: 07/16/2023 21:12    Procedures Procedures   Medications Ordered in ED Medications  naloxone (NARCAN) injection 0.4 mg (has no administration in time range)    ED Course/ Medical Decision Making/ A&P                               Medical Decision Making Amount and/or Complexity of Data Reviewed Labs: ordered. Radiology: ordered.   This patient presents to the ED for concern of drug overdose.  Differential diagnosis includes heroin overdose, cocaine use, substance use disorder, polysubstance abuse, pneumonia, COVID-19   Lab Tests:  I Ordered, and personally interpreted labs.  The pertinent results include: All labs pending at time of handoff   Imaging Studies ordered:  I ordered imaging studies including chest x-ray, CT head I independently visualized and interpreted imaging which showed no acute intracranial normality but some bilateral atelectasis noted, CT head imaging is negative for any intracranial injury or hematoma and posterior scalp contusion is seen I agree with the radiologist interpretation   Medicines ordered and prescription drug management:  I ordered medication including Narcan as needed for opioid reversal Reevaluation of the patient after these medicines showed that the patient resolved I have reviewed the patients home medicines and have made adjustments as needed   Problem List / ED Course:  Patient presents to the emergency department concerns of a drug overdose.  Patient reportedly used heroin earlier this afternoon requiring EMS to arrive on scene at approximately 4 PM with administration of Narcan.  Patient then once again was seen by EMS at 6:40 PM and  once again given Narcan.  Patient reportedly used heroin and cocaine.  Denies any other substance use.  During initial collar by EMS patient reportedly fell on the ground and hit her head and was complaining of a knot on the back of her head.  Patient was  initially seen to be hypoxic at 76% requiring O2 supplementation.  Patient's oxygen levels.  Elevated and is now currently not requiring continuous oxygen. Chest xray negative for pneumonia. CT head negative for bleed or other abnormality. Labs pending due to hard stick.  10:19 PM Care of Beth [name] transferred to [facility] and Dr. Cristy Folks at the end of my shift as the patient will require reassessment once labs/imaging have resulted. Patient presentation, ED course, and plan of care discussed with review of all pertinent labs and imaging. Please see his/her note for further details regarding further ED course and disposition. Plan at time of handoff is MTF assuming she has no abnormality arise in lab workup or becomes hypoxic again. Currently trying to discontinue non-rebreather. This may be altered or completely changed at the discretion of the oncoming team pending results of further workup.    Final Clinical Impression(s) / ED Diagnoses Final diagnoses:  None    Rx / DC Orders ED Discharge Orders     None         Fanya, Melin 07/16/23 2219    Tegeler, Canary Brim, MD 07/16/23 2314

## 2023-07-16 NOTE — ED Triage Notes (Addendum)
Patient arrives with [facility] after being picked up from [employer] parking lot at 1840 this evening. Patient was given 0.8 mg Narcan IM by friend prior to EMS arrival. EMS was called out earlier this afternoon around 1600; patient was unconscious then, given 1.5 mg Narcan from Fire. Friend was performing CPR prior to Fire arrival. Per EMS, patient fell out of vehicle during the earlier call and "has knot on back of head." Patient reports to EMS she has hx of heroin use but denies using today. Patient was 76% O2 on room air; placed on 15 L NRB with O2 sat 97%. Patient is a&o x4 during triage.   133/77 RR 18 172 CBG  110 HR

## 2023-07-16 NOTE — ED Notes (Signed)
Pt given sandwich and diet GA

## 2023-07-16 NOTE — ED Provider Notes (Incomplete)
  Physical Exam  BP 118/80   Pulse 94   Temp 98 F (36.7 C) (Oral)   Resp (!) 21   Ht 5\' 6"  (1.676 m)   Wt 63.5 kg   LMP 07/02/2023 (Exact Date)   SpO2 100%   BMI 22.60 kg/m   Physical Exam Vitals and nursing note reviewed.  Constitutional:      General: She is not in acute distress.    Appearance: She is not toxic-appearing.  HENT:     Head: Normocephalic and atraumatic.  Pulmonary:     Effort: No respiratory distress.  Skin:    Coloration: Skin is not jaundiced or pale.  Neurological:     Mental Status: She is alert and oriented to person, place, and time.  Psychiatric:        Behavior: Behavior normal.     Procedures  Procedures  ED Course / MDM    Medical Decision Making Amount and/or Complexity of Data Reviewed Labs: ordered. Radiology: ordered.  Risk OTC drugs. Prescription drug management.   42 year old female signed out to me at shift change pending observation.  Please see the previous Frater's note for further details of the event.  In short, this is a 42 year old female with a history of IV drug use.  The patient presents to the ED through EMS after being found in parking lot unconscious due to suspected heroin overdose.  Patient had EMS called earlier today where at which time she refused transport.  The patient apparently was found by her boyfriend in a parking lot and EMS was called again after the patient received Narcan. Patient has endorsed heroin use, cocaine use today.  Patient has been here in the department for 3 hours at this time, there was delay in obtaining her labs.  Patient lab work currently pending at this time.  She was signed out to me pending observation.  She is currently off of supplemental oxygen with an oxygen saturation of 95% on room air.  Alert and oriented x 4.  Requesting soda.

## 2023-07-16 NOTE — ED Notes (Signed)
Patient's backpack at nurse's station with this RN.

## 2023-07-16 NOTE — ED Provider Notes (Signed)
Physical Exam  BP (!) 93/55   Pulse 82   Temp 98.2 F (36.8 C) (Oral)   Resp 20   Ht 5\' 6"  (1.676 m)   Wt 63.5 kg   LMP 07/02/2023 (Exact Date) Comment: negative urine pregnancty test 07/16/23  SpO2 91%   BMI 22.60 kg/m   Physical Exam Vitals and nursing note reviewed.  Constitutional:      General: She is not in acute distress.    Appearance: She is not toxic-appearing.  HENT:     Head: Normocephalic and atraumatic.     Nose: Nose normal.     Mouth/Throat:     Mouth: Mucous membranes are moist.     Pharynx: Oropharynx is clear.  Eyes:     Extraocular Movements: Extraocular movements intact.     Conjunctiva/sclera: Conjunctivae normal.     Pupils: Pupils are equal, round, and reactive to light.  Cardiovascular:     Rate and Rhythm: Regular rhythm. Tachycardia present.  Pulmonary:     Effort: No respiratory distress.  Abdominal:     General: Abdomen is flat. Bowel sounds are normal.     Palpations: Abdomen is soft.     Tenderness: There is no abdominal tenderness.  Skin:    Coloration: Skin is not jaundiced or pale.  Neurological:     Mental Status: She is alert and oriented to person, place, and time.  Psychiatric:        Behavior: Behavior normal.     Procedures  .Critical Care  Performed by: Al Decant, PA-C Authorized by: Al Decant, PA-C   Critical care provider statement:    Critical care time was exclusive of:  Separately billable procedures and treating other patients and teaching time   Critical care was necessary to treat or prevent imminent or life-threatening deterioration of the following conditions:  Respiratory failure and toxidrome   Critical care was time spent personally by me on the following activities:  Ordering and performing treatments and interventions, ordering and review of laboratory studies, ordering and review of radiographic studies, pulse oximetry, re-evaluation of patient's condition, review of old charts,  obtaining history from patient or surrogate, interpretation of cardiac output measurements, gastric intubation, examination of patient, evaluation of patient's response to treatment, discussions with primary provider, discussions with consultants, development of treatment plan with patient or surrogate and blood draw for specimens   I assumed direction of critical care for this patient from another provider in my specialty: no     Care discussed with: admitting provider     ED Course / MDM   Clinical Course as of 07/17/23 0653  Wed Jul 17, 2023  0054 Recently stopped menstrual cycle. [CG]    Clinical Course User Index [CG] Al Decant, PA-C   Medical Decision Making Amount and/or Complexity of Data Reviewed Labs: ordered. Radiology: ordered.  Risk OTC drugs. Prescription drug management.   42 year old female signed out to me at shift change pending observation.  Please see the previous Frater's note for further details of the event.  In short, this is a 42 year old female with a history of IV drug use.  The patient presents to the ED through EMS after being found in parking lot unconscious due to suspected heroin overdose.  Patient had EMS called earlier today where at which time she refused transport.  The patient apparently was found by her boyfriend in a parking lot and EMS was called again after the patient received Narcan. Patient has endorsed heroin  use, cocaine use today.  Patient has been here in the department for 3 hours at this time, there was delay in obtaining her labs.  Patient lab work currently pending at this time.  She was signed out to me pending observation.  She is currently off of supplemental oxygen with an oxygen saturation of 95% on room air.  Alert and oriented x 4.  Requesting soda.   Patient lab work shows CBC with leukocytosis 11.6, hemoglobin 8.7.  Patient hemoglobin is markedly decreased however patient does report that she recently stopped her  menstrual cycle and it was particularly heavy.  Patient urinalysis shows protein, bacteria however patient denies dysuria.  Metabolic panel shows sodium 134, creatinine 1.18.  Patient given 1 L of fluid.  Patient ethanol undetectable.  Rapid urine drug screen positive for opiates, cocaine, THC.  Patient viral panel negative for all.  Chest x-ray unremarkable.  Patient requested Tylenol for headache, states that she did hit her head after being pulled out of car today.  This was provided.  Reassessment, patient blood pressure continues to be soft.  Will provide additional liter of fluid.  I had my attending Dr. Wilkie Aye, and assessed the patient.  Maina blood pressure confirms hypotension.  Will continue to assess.  Patient also appears to be hypoxic at this time.  No indication for Narcan, patient respiratory rate is satisfactory.  Patient pupils not pinpoint.  Will assess for underlying cause, CTA has been ordered.  There is concern for aspiration.  Will administer Unasyn.  Patient blood pressure continues to be soft so additional third liter of fluid administered.  Patient will require admission for hypoxia.  Signed out to oncoming provider Soto PA-C pending admission.  On reassessment, patient blood pressure is hypotensive.  Patient provided    Clent Ridges 07/17/23 1610    Tegeler, Canary Brim, MD 07/18/23 [phone]

## 2023-07-16 NOTE — ED Notes (Signed)
Patient's O2 via NRB decreased from 15 L to 10L with saturation of 100%. When decreased to 6L, O2 saturation was 74%.

## 2023-07-17 ENCOUNTER — Encounter (HOSPITAL_COMMUNITY): Payer: Self-pay

## 2023-07-17 ENCOUNTER — Emergency Department (HOSPITAL_COMMUNITY): Payer: Medicare HMO

## 2023-07-17 DIAGNOSIS — M797 Fibromyalgia: Secondary | ICD-10-CM | POA: Diagnosis present

## 2023-07-17 DIAGNOSIS — T402X4A Poisoning by other opioids, undetermined, initial encounter: Secondary | ICD-10-CM | POA: Diagnosis not present

## 2023-07-17 DIAGNOSIS — Z5329 Procedure and treatment not carried out because of patient's decision for other reasons: Secondary | ICD-10-CM | POA: Diagnosis present

## 2023-07-17 DIAGNOSIS — G35 Multiple sclerosis: Secondary | ICD-10-CM | POA: Diagnosis present

## 2023-07-17 DIAGNOSIS — T401X1A Poisoning by heroin, accidental (unintentional), initial encounter: Secondary | ICD-10-CM | POA: Diagnosis present

## 2023-07-17 DIAGNOSIS — J690 Pneumonitis due to inhalation of food and vomit: Secondary | ICD-10-CM | POA: Diagnosis present

## 2023-07-17 DIAGNOSIS — R8271 Bacteriuria: Secondary | ICD-10-CM | POA: Diagnosis present

## 2023-07-17 DIAGNOSIS — E538 Deficiency of other specified B group vitamins: Secondary | ICD-10-CM | POA: Diagnosis present

## 2023-07-17 DIAGNOSIS — E441 Mild protein-calorie malnutrition: Secondary | ICD-10-CM | POA: Diagnosis present

## 2023-07-17 DIAGNOSIS — Z825 Family history of asthma and other chronic lower respiratory diseases: Secondary | ICD-10-CM | POA: Diagnosis not present

## 2023-07-17 DIAGNOSIS — Z8249 Family history of ischemic heart disease and other diseases of the circulatory system: Secondary | ICD-10-CM | POA: Diagnosis not present

## 2023-07-17 DIAGNOSIS — D75839 Thrombocytosis, unspecified: Secondary | ICD-10-CM | POA: Diagnosis present

## 2023-07-17 DIAGNOSIS — D509 Iron deficiency anemia, unspecified: Secondary | ICD-10-CM | POA: Diagnosis present

## 2023-07-17 DIAGNOSIS — F17290 Nicotine dependence, other tobacco product, uncomplicated: Secondary | ICD-10-CM | POA: Diagnosis present

## 2023-07-17 DIAGNOSIS — F1129 Opioid dependence with unspecified opioid-induced disorder: Secondary | ICD-10-CM | POA: Diagnosis present

## 2023-07-17 DIAGNOSIS — Z8711 Personal history of peptic ulcer disease: Secondary | ICD-10-CM

## 2023-07-17 DIAGNOSIS — R0902 Hypoxemia: Secondary | ICD-10-CM | POA: Diagnosis present

## 2023-07-17 DIAGNOSIS — Z833 Family history of diabetes mellitus: Secondary | ICD-10-CM | POA: Diagnosis not present

## 2023-07-17 DIAGNOSIS — F419 Anxiety disorder, unspecified: Secondary | ICD-10-CM | POA: Diagnosis present

## 2023-07-17 DIAGNOSIS — I959 Hypotension, unspecified: Secondary | ICD-10-CM | POA: Diagnosis not present

## 2023-07-17 DIAGNOSIS — Z1152 Encounter for screening for COVID-19: Secondary | ICD-10-CM | POA: Diagnosis not present

## 2023-07-17 DIAGNOSIS — J9601 Acute respiratory failure with hypoxia: Secondary | ICD-10-CM | POA: Diagnosis present

## 2023-07-17 DIAGNOSIS — D72829 Elevated white blood cell count, unspecified: Secondary | ICD-10-CM | POA: Diagnosis present

## 2023-07-17 DIAGNOSIS — R0781 Pleurodynia: Secondary | ICD-10-CM | POA: Diagnosis present

## 2023-07-17 DIAGNOSIS — E871 Hypo-osmolality and hyponatremia: Secondary | ICD-10-CM | POA: Diagnosis present

## 2023-07-17 DIAGNOSIS — F319 Bipolar disorder, unspecified: Secondary | ICD-10-CM | POA: Diagnosis present

## 2023-07-17 DIAGNOSIS — Z79899 Other long term (current) drug therapy: Secondary | ICD-10-CM | POA: Diagnosis not present

## 2023-07-17 DIAGNOSIS — K219 Gastro-esophageal reflux disease without esophagitis: Secondary | ICD-10-CM | POA: Diagnosis present

## 2023-07-17 DIAGNOSIS — Z6822 Body mass index (BMI) 22.0-22.9, adult: Secondary | ICD-10-CM | POA: Diagnosis not present

## 2023-07-17 LAB — URINALYSIS, ROUTINE W REFLEX MICROSCOPIC
Bilirubin Urine: NEGATIVE
Glucose, UA: NEGATIVE mg/dL
Hgb urine dipstick: NEGATIVE
Ketones, ur: NEGATIVE mg/dL
Leukocytes,Ua: NEGATIVE
Nitrite: NEGATIVE
Protein, ur: 30 mg/dL — AB
Specific Gravity, Urine: 1.025 (ref 1.005–1.030)
pH: 5 (ref 5.0–8.0)

## 2023-07-17 LAB — RAPID URINE DRUG SCREEN, HOSP PERFORMED
Amphetamines: NOT DETECTED
Barbiturates: NOT DETECTED
Benzodiazepines: NOT DETECTED
Cocaine: POSITIVE — AB
Opiates: POSITIVE — AB
Tetrahydrocannabinol: POSITIVE — AB

## 2023-07-17 LAB — LACTIC ACID, PLASMA: Lactic Acid, Venous: 1.2 mmol/L (ref 0.5–1.9)

## 2023-07-17 LAB — MRSA NEXT GEN BY PCR, NASAL: MRSA by PCR Next Gen: NOT DETECTED

## 2023-07-17 LAB — PREGNANCY, URINE: Preg Test, Ur: NEGATIVE

## 2023-07-17 MED ORDER — SODIUM CHLORIDE 0.9 % IV SOLN: INTRAVENOUS | Status: DC

## 2023-07-17 MED ORDER — BUSPIRONE HCL 5 MG PO TABS
15.0000 mg | ORAL_TABLET | Freq: Three times a day (TID) | ORAL | Status: DC
  Administered 2023-07-17 – 2023-07-18 (×2): 15 mg via ORAL
  Filled 2023-07-17 (×2): qty 1

## 2023-07-17 MED ORDER — MIDODRINE HCL 5 MG PO TABS
5.0000 mg | ORAL_TABLET | Freq: Once | ORAL | Status: AC
  Administered 2023-07-17: 5 mg via ORAL
  Filled 2023-07-17: qty 1

## 2023-07-17 MED ORDER — ACETAMINOPHEN 325 MG PO TABS
650.0000 mg | ORAL_TABLET | Freq: Four times a day (QID) | ORAL | Status: DC | PRN
  Administered 2023-07-17 – 2023-07-18 (×3): 650 mg via ORAL
  Filled 2023-07-17 (×3): qty 2

## 2023-07-17 MED ORDER — SODIUM CHLORIDE 0.9 % IV BOLUS
1000.0000 mL | Freq: Once | INTRAVENOUS | Status: AC
  Administered 2023-07-17: 1000 mL via INTRAVENOUS

## 2023-07-17 MED ORDER — SODIUM CHLORIDE (PF) 0.9 % IJ SOLN
INTRAMUSCULAR | Status: AC
  Filled 2023-07-17: qty 50

## 2023-07-17 MED ORDER — PANTOPRAZOLE SODIUM 40 MG PO TBEC
40.0000 mg | DELAYED_RELEASE_TABLET | Freq: Every day | ORAL | Status: DC
  Administered 2023-07-17 – 2023-07-18 (×2): 40 mg via ORAL
  Filled 2023-07-17 (×2): qty 1

## 2023-07-17 MED ORDER — ONDANSETRON HCL 4 MG/2ML IJ SOLN
4.0000 mg | Freq: Four times a day (QID) | INTRAMUSCULAR | Status: DC | PRN
  Administered 2023-07-17 – 2023-07-18 (×2): 4 mg via INTRAVENOUS
  Filled 2023-07-17 (×2): qty 2

## 2023-07-17 MED ORDER — MIDODRINE HCL 5 MG PO TABS
10.0000 mg | ORAL_TABLET | Freq: Once | ORAL | Status: AC
  Administered 2023-07-17: 10 mg via ORAL
  Filled 2023-07-17: qty 2

## 2023-07-17 MED ORDER — TRAZODONE HCL 50 MG PO TABS
100.0000 mg | ORAL_TABLET | Freq: Every evening | ORAL | Status: DC | PRN
  Administered 2023-07-17: 100 mg via ORAL
  Filled 2023-07-17: qty 2

## 2023-07-17 MED ORDER — HYDROXYZINE HCL 25 MG PO TABS
50.0000 mg | ORAL_TABLET | Freq: Four times a day (QID) | ORAL | Status: DC | PRN
  Administered 2023-07-17 – 2023-07-18 (×3): 50 mg via ORAL
  Filled 2023-07-17 (×3): qty 2

## 2023-07-17 MED ORDER — KETOROLAC TROMETHAMINE 15 MG/ML IJ SOLN
15.0000 mg | Freq: Once | INTRAMUSCULAR | Status: AC
  Administered 2023-07-17: 15 mg via INTRAVENOUS
  Filled 2023-07-17: qty 1

## 2023-07-17 MED ORDER — ONDANSETRON HCL 4 MG PO TABS: 4.0000 mg | ORAL_TABLET | Freq: Four times a day (QID) | ORAL | Status: DC | PRN

## 2023-07-17 MED ORDER — IOHEXOL 350 MG/ML SOLN
100.0000 mL | Freq: Once | INTRAVENOUS | Status: AC | PRN
  Administered 2023-07-17: 75 mL via INTRAVENOUS

## 2023-07-17 MED ORDER — SODIUM CHLORIDE 0.9 % IV BOLUS
1000.0000 mL | Freq: Once | INTRAVENOUS | Status: AC | PRN
  Administered 2023-07-17: 1000 mL via INTRAVENOUS

## 2023-07-17 MED ORDER — CHLORHEXIDINE GLUCONATE CLOTH 2 % EX PADS
6.0000 | MEDICATED_PAD | Freq: Every day | CUTANEOUS | Status: DC
  Administered 2023-07-17: 6 via TOPICAL

## 2023-07-17 MED ORDER — SODIUM CHLORIDE 0.9 % IV SOLN
3.0000 g | Freq: Once | INTRAVENOUS | Status: AC
  Administered 2023-07-17: 3 g via INTRAVENOUS
  Filled 2023-07-17: qty 8

## 2023-07-17 MED ORDER — SODIUM CHLORIDE 0.9 % IV SOLN
3.0000 g | Freq: Four times a day (QID) | INTRAVENOUS | Status: DC
  Administered 2023-07-17 – 2023-07-18 (×5): 3 g via INTRAVENOUS
  Filled 2023-07-17 (×6): qty 8

## 2023-07-17 MED ORDER — ORAL CARE MOUTH RINSE: 15.0000 mL | OROMUCOSAL | Status: DC | PRN

## 2023-07-17 NOTE — Plan of Care (Signed)
Plan of care and goals discussed with patient, time given for questions, patient handbook/guide at bedside.  Problem: Education: Goal: Knowledge of disease or condition will improve Outcome: Progressing Goal: Knowledge of the prescribed therapeutic regimen will improve Outcome: Progressing Goal: Individualized Educational Video(s) Outcome: Progressing   Problem: Activity: Goal: Ability to tolerate increased activity will improve Outcome: Progressing Goal: Will verbalize the importance of balancing activity with adequate rest periods Outcome: Progressing   Problem: Respiratory: Goal: Ability to maintain a clear airway will improve Outcome: Progressing Goal: Levels of oxygenation will improve Outcome: Progressing Goal: Ability to maintain adequate ventilation will improve Outcome: Progressing   Problem: Activity: Goal: Ability to tolerate increased activity will improve Outcome: Progressing   Problem: Clinical Measurements: Goal: Ability to maintain a body temperature in the normal range will improve Outcome: Progressing   Problem: Respiratory: Goal: Ability to maintain adequate ventilation will improve Outcome: Progressing Goal: Ability to maintain a clear airway will improve Outcome: Progressing   Problem: Education: Goal: Knowledge of General Education information will improve Description: Including pain rating scale, medication(s)/side effects and non-pharmacologic comfort measures Outcome: Progressing   Problem: Health Behavior/Discharge Planning: Goal: Ability to manage health-related needs will improve Outcome: Progressing   Problem: Clinical Measurements: Goal: Ability to maintain clinical measurements within normal limits will improve Outcome: Progressing Goal: Will remain free from infection Outcome: Progressing Goal: Diagnostic test results will improve Outcome: Progressing Goal: Respiratory complications will improve Outcome: Progressing Goal:  Cardiovascular complication will be avoided Outcome: Progressing   Problem: Activity: Goal: Risk for activity intolerance will decrease Outcome: Progressing   Problem: Nutrition: Goal: Adequate nutrition will be maintained Outcome: Progressing   Problem: Coping: Goal: Level of anxiety will decrease Outcome: Progressing   Problem: Elimination: Goal: Will not experience complications related to bowel motility Outcome: Progressing Goal: Will not experience complications related to urinary retention Outcome: Progressing   Problem: Pain Managment: Goal: General experience of comfort will improve Outcome: Progressing   Problem: Safety: Goal: Ability to remain free from injury will improve Outcome: Progressing   Problem: Skin Integrity: Goal: Risk for impaired skin integrity will decrease Outcome: Progressing

## 2023-07-17 NOTE — ED Provider Notes (Addendum)
I provided a substantive portion of the care of this patient.  I personally made/approved the management plan for this patient and take responsibility for the patient management.  EKG Interpretation Date/Time:  Tuesday July 16 2023 20:17:01 EDT Ventricular Rate:  110 PR Interval:  154 QRS Duration:  88 QT Interval:  339 QTC Calculation: 459 R Axis:   91  Text Interpretation: Sinus tachycardia Biatrial enlargement Borderline right axis deviation RSR' in V1 or V2, probably normal variant when compared to prior, faster rate. No STEMI Confirmed by Theda Belfast ([id number]) on 07/16/2023 9:04:17 PM  I evaluated this patient with PA, Groce.  Patient with borderline blood pressures after several hours of monitoring.  Had 2 separate events yesterday requiring Narcan for presumed heroin overdose.  She was hypoxic for EMS prior to arrival.  Chest x-ray looked clear.  Initial blood pressures 100s over 80s.  This is at her baseline.  She began to trend down slightly.  She is awake, alert.  Pupils are threes and reactive.  She has a normal respiratory rate.  Do not feel she requires further Narcan.  She was given several liters of fluid with minimal improvement.  Maps did improve to above 65.  Have expanded workup and will cover for aspiration given high clinical suspicion.   Shon Baton, MD 07/17/23 [phone]  Angiocath insertion Performed by: Shon Baton  Consent: Verbal consent obtained. Risks and benefits: risks, benefits and alternatives were discussed Time out: Immediately prior to procedure a "time out" was called to verify the correct patient, procedure, equipment, support staff and site/side marked as required.  Preparation: Patient was prepped and draped in the usual sterile fashion.  Vein Location: right basilic  Ultrasound Guided  Gauge: 20  Normal blood return and flush without difficulty Patient tolerance: Patient tolerated the procedure well with no immediate  complications.     Shon Baton, MD 07/17/23 [phone]

## 2023-07-17 NOTE — ED Notes (Signed)
Patient transported to CT 

## 2023-07-17 NOTE — Plan of Care (Signed)
Problem: Education: Goal: Knowledge of disease or condition will improve Outcome: Progressing   Problem: Respiratory: Goal: Ability to maintain a clear airway will improve Outcome: Progressing   Problem: Respiratory: Goal: Levels of oxygenation will improve Outcome: Progressing

## 2023-07-17 NOTE — Progress Notes (Signed)
[facility] admitting physician addendum:  The nursing staff just reported that the patient's blood pressure is 68/43 mmHg 10 minutes after the second 1000 mL bolus finished.  We are adding another 1000 mL NS bolus and will start the patient on midodrine.  Sanda Klein, MD.

## 2023-07-17 NOTE — Progress Notes (Signed)
Lab came to beside to obtain patients labs. Patient refused to be stuck, after speaking with patient on the importance of [place] getting her labs, she stated she spoke with her mother who told her she could refuse being stuck, patient stated she was stuck over 6 times and is refusing to be stuck anymore. On call provider made aware of patient refusal.

## 2023-07-17 NOTE — Consult Note (Addendum)
[facility] Face-to-Face Psychiatry Consult   Reason for Consult:  Overdose Referring Physician:  Dr. Robb Matar Patient Identification: Beth [name] MRN:  [id number] Principal Diagnosis: Acute respiratory failure with hypoxia [facility]) Diagnosis:  Principal Problem:   Acute respiratory failure with hypoxia (HCC) Active Problems:   Multiple sclerosis (HCC)   Acid reflux   Major depressive disorder, recurrent episode (HCC)   Tobacco abuse   Hypotension   Opiate overdose (HCC)   Vitamin B12 deficiency   Opioid dependence with opioid-induced disorder (HCC)   Anxiety disorder   Microcytic anemia   Hyponatremia   Thrombocytosis   Leukocytosis   Mild protein malnutrition (HCC)   Aspiration pneumonia (HCC)   Asymptomatic bacteriuria   Pleuritic chest pain   History of peptic ulcer disease   Total Time spent with patient: 1 hour  Subjective:   Beth [name] is a 42 y.o. female patient admitted with unintentional overdose.  The patient was seen and assessed today following an overdose. Psychiatry was consulted to evaluate her safety and determine if the overdose was intentional or unintentional. She is alert and oriented to person, place, and time, and was awake and active upon entering the room. Her blood pressure was recorded at 75/34, but she was willing to participate in the assessment while nursing staff provided IV fluid boluses.  The patient reports being clean for over 9 months, but developed a throbbing headache and admitted, "I don't know why I even took the heroin, but I figured I would take it to see if it helped my headache. I knew I was in trouble." She has been in remission for 9 months after a 10-year struggle with Fentanyl. She denies any previous psychiatric history aside from substance use and has attended several inpatient rehab programs, as well as sought sponsorship for alcohol use. Additionally, she denies any recent or past suicidal ideations or attempts but acknowledges that she has  legal charges pending at this time. Her current charges include death by distrubution for her best friend in 2022. She also reports that she has charges pending for possession for methamphetamine and cocaine. SHe states she has been dealing with this and denied the plea deal of 24-156 months, and the courts are to determine if it goes to trial. She reports it hurts but she just misses her friend and wanted to help him. " His mother wouldn't help him, he wanted help. So I just wanted to help him the best way I knew how at the time. I didn't want him to die." She continues to endorse that her overdose was not intentional.   She denies any acute symptoms of mania at this time to include impulsivity, grandiosity, mood lability, hypersexuality.  She does not present with any of the above symptoms on this evaluation.  She further denies any depressive symptoms to include anhedonia, hopelessness, worthlessness, guilty, suicidal.  She denies any acute psychosis, paranoia.  She does not appear to be displaying any or responding to internal stimuli, external stimuli, or exhibiting delusional thought disorder.  Patient denies any access to weapons, denies any alcohol and or substance abuse.  She reports moderate sleep and fair appetite.  She also is receiving services through [facility] psychiatry, and her caregiver is available to assist with medication management in which she reports compliance with most of her appointments and medications (chart verified). She is currently taking Bupropion XL 150mg  po daily and Olanzapine 5mg  po at bedtime.  Patient denies any auditory and/or visual hallucinations, does  not appear to be responding to internal or external stimuli.  There is no evidence of delusional thought content and patient appears to answer all questions appropriately.  At this time patient appears to be psychiatrically stable to discharge home, with support system services in place.     HPI:   Beth [name] is a  42 y.o. female with medical history significant of GERD, PUD/gastric ulcer, history of blood transfusion, anxiety, depression, insomnia, asthma, chronic bipolar disorder, chronic diarrhea, chronic nausea, fibromyalgia, alcohol abuse, history of delirium tremens, migraine headaches, multiple sclerosis, polysubstance abuse who was brought to the emergency department via EMS due to a drug overdose after being found out of [employer] parking lot at around 1840 yesterday evening by a friend who started to perform CPR and gave the patient 0.8 mg of Narcan IM prior to the ambulance crew arriving.  She received another 1.5 mg of naloxone by the EMS personnel.  She also transiently received CPR.  The patient uses heroin daily and admitted using earlier in the day.  She had to be put on NRB oxygen for a while but is currently breathing in the mid 20s with an O2 sat in the high 90s.  Apparently, now she has told the nursing staff that you may have been with intentions of self-harm.  She denied fever, chills, rhinorrhea, sore throat, wheezing or hemoptysis.  Positive musculoskeletal CP from CPR, but no palpitations, diaphoresis, PND, orthopnea or pitting edema of the lower extremities.  No abdominal pain, nausea, emesis, diarrhea, constipation, melena or hematochezia.  No flank pain, dysuria, frequency or hematuria.  No polyuria, polydipsia, polyphagia or blurred vision.   Past Psychiatric History: Severe ETOH use disorder, has been clean for 15 years. Cocaine use disorder (intermittent for years, clean for 9 months. Opiate use disorder( 10 years fentanyl use, clean 9 months). Pt denies ever been hospitalized for mental health concerns in the past. Denies any previous history of suicidal thoughts, suicidal ideations, and or non suicidal self injurious behaviors. Pt denies history of aggression, agitation, violent behavior, and or history of homicidal ideations/thoughts.  Patient further denies any current, previous legal  charges.  Patient further denies access to guns, weapons, or any engagement with the legal system.  Patient denies history of illicit substances to include synthetic substances, any cannabidiol, supplemental herbs.    Risk to Self:   Denies Risk to Others:   Denies Prior Inpatient Therapy:   Denies Prior Outpatient Therapy:   Substance use rehab and inpatient rehab programming.   Past Medical History:  Past Medical History:  Diagnosis Date   Acid reflux    Anxiety    Asthma    Chronic bipolar disorder (HCC)    2024   Chronic diarrhea    self reported   Chronic nausea    self reported   Chronic pain    self reported chronic pain, back pain, fibromyalgia   Delirium tremens (HCC)    Depression    ETOH abuse    Gastric ulcer    History of blood transfusion    Insomnia    Migraine    Multiple sclerosis (HCC) 2005   diagnosed in [place], failed steroids, no other medication   Polysubstance abuse (HCC)    Substance abuse (HCC)     Past Surgical History:  Procedure Laterality Date   BACK SURGERY     2022   DG TEETH FULL Bilateral    2024   DIAGNOSTIC LAPAROSCOPY  10/29/2001  repair of perforation at [facility] anastamosis    GASTRIC BYPASS     LAPAROTOMY N/A 08/18/2014   Procedure: EXPLORATORY LAPAROTOMY, Patch graft of gastric perforation;  Surgeon: Abigail Miyamoto, MD;  Location: [facility] OR;  Service: General;  Laterality: N/A;   LUMBAR LAMINECTOMY/DECOMPRESSION MICRODISCECTOMY Right 04/14/2014   Procedure: LUMBAR LAMINECTOMY/DECOMPRESSION MICRODISCECTOMY LEVEL L4-5;  Surgeon: Maeola Harman, MD;  Location: [facility];  Service: Neurosurgery;  Laterality: Right;  LUMBAR LAMINECTOMY/DECOMPRESSION MICRODISCECTOMY LEVEL L4-5   Family History:  Family History  Problem Relation Age of Onset   Hypertension Paternal Grandfather    Colon cancer Paternal Grandfather 44   Asthma Paternal Grandmother    Hypertension Paternal Grandmother    Breast cancer Paternal Grandmother    Cancer  Paternal Grandmother        lung    Diabetes Maternal Grandmother    Diabetes Maternal Grandfather    Hypertension Father    Alcohol abuse Father    Urinary tract infection Mother    Stroke Mother 73       3 strokes    Family Psychiatric  History: Sister completed suicide by cutting (34 lacerations), ME suspected suspicious behavior. MGM- Bipolar, Mother Benzodiazapine addiction, recovery at this time.  Social History:  Social History   Substance and Sexual Activity  Alcohol Use No   Alcohol/week: 0.0 standard drinks of alcohol   Comment: patient states she does not drink any more.     Social History   Substance and Sexual Activity  Drug Use Yes   Types: Cocaine   Comment: heroin for "pain    Social History   Socioeconomic History   Marital status: Legally Separated    Spouse name: Alice   Number of children: 0   Years of education: 12   Highest education level: Not on file  Occupational History   Occupation: disability  Tobacco Use   Smoking status: Former    Current packs/day: 0.00    Average packs/day: 2.0 packs/day for 27.0 years (54.0 ttl pk-yrs)    Types: Cigarettes    Start date: 09/29/1995    Quit date: 09/28/2022    Years since quitting: 0.8   Smokeless tobacco: Never  Vaping Use   Vaping status: Every Day   Substances: Nicotine  Substance and Sexual Activity   Alcohol use: No    Alcohol/week: 0.0 standard drinks of alcohol    Comment: patient states she does not drink any more.   Drug use: Yes    Types: Cocaine    Comment: heroin for "pain   Sexual activity: Yes    Birth control/protection: None, Condom  Other Topics Concern   Not on file  Social History Narrative   Lives with mother, [place] Mole   Caffeine use: soda (3-4 per day)   No coffee      Right handed    Social Determinants of Health   Financial Resource Strain: Medium Risk (11/02/2022)   Received from [facility], [facility]   Overall Financial Resource Strain (CARDIA)     Difficulty of Paying Living Expenses: Somewhat hard  Food Insecurity: No Food Insecurity (07/17/2023)   Hunger Vital Sign    Worried About Running Out of Food in the Last Year: Never true    Ran Out of Food in the Last Year: Never true  Transportation Needs: No Transportation Needs (07/17/2023)   PRAPARE - Administrator, Civil Service (Medical): No    Lack of Transportation (Non-Medical): No  Physical Activity: Insufficiently Active (  07/04/2021)   Received from [facility], [facility]   Exercise Vital Sign    Days of Exercise per Week: 3 days    Minutes of Exercise per Session: 30 min  Stress: Stress Concern Present (07/04/2021)   Received from [facility], [facility] - Occupational Stress Questionnaire    Feeling of Stress : Rather much  Social Connections: Unknown (03/01/2022)   Received from [facility], [facility]   Social Network    Social Network: Not on file   Additional Social History:    Allergies:   Allergies  Allergen Reactions   Cymbalta [Duloxetine Hcl] Other (See Comments)    Made body jerk and twitch, spasms   Dilaudid [Hydromorphone Hcl] Other (See Comments)    Anger, aggression    Naltrexone Swelling   Nsaids Nausea And Vomiting   Zolpidem Other (See Comments)    Sleep walking   Abilify [Aripiprazole] Itching    Clenching of teeth, Grinds teeth   Meperidine     Mental Status Changes, Agitation, swearing   Prednisone Other (See Comments)    Steroids in general    Labs:  Results for orders placed or performed during the hospital encounter of 07/16/23 (from the past 48 hour(s))  CBC with Differential     Status: Abnormal   Collection Time: 07/16/23  8:48 PM  Result Value Ref Range   WBC 11.6 (H) 4.0 - 10.5 K/uL   RBC 4.24 3.87 - 5.11 MIL/uL   Hemoglobin 8.7 (L) 12.0 - 15.0 g/dL    Comment: Reticulocyte Hemoglobin testing may be clinically indicated, consider ordering this  additional test [id number]    HCT 29.5 (L) 36.0 - 46.0 %   MCV 69.6 (L) 80.0 - 100.0 fL   MCH 20.5 (L) 26.0 - 34.0 pg   MCHC 29.5 (L) 30.0 - 36.0 g/dL   RDW 82.9 (H) 56.2 - 13.0 %   Platelets 496 (H) 150 - 400 K/uL   nRBC 0.0 0.0 - 0.2 %   Neutrophils Relative % 87 %   Neutro Abs 10.1 (H) 1.7 - 7.7 K/uL   Lymphocytes Relative 5 %   Lymphs Abs 0.6 (L) 0.7 - 4.0 K/uL   Monocytes Relative 7 %   Monocytes Absolute 0.8 0.1 - 1.0 K/uL   Eosinophils Relative 0 %   Eosinophils Absolute 0.0 0.0 - 0.5 K/uL   Basophils Relative 0 %   Basophils Absolute 0.0 0.0 - 0.1 K/uL   Immature Granulocytes 1 %   Abs Immature Granulocytes 0.07 0.00 - 0.07 K/uL   Ovalocytes PRESENT     Comment: Performed at [facility], [address] [place], [place] [address]  Comprehensive metabolic panel     Status: Abnormal   Collection Time: 07/16/23  8:48 PM  Result Value Ref Range   Sodium 134 (L) 135 - 145 mmol/L   Potassium 4.2 3.5 - 5.1 mmol/L   Chloride 99 98 - 111 mmol/L   CO2 25 22 - 32 mmol/L   Glucose, Bld 70 70 - 99 mg/dL    Comment: Glucose reference range applies only to samples taken after fasting for at least 8 hours.   BUN 13 6 - 20 mg/dL   Creatinine, Ser 4.69 (H) 0.44 - 1.00 mg/dL   Calcium 8.7 (L) 8.9 - 10.3 mg/dL   Total Protein 7.5 6.5 - 8.1 g/dL   Albumin 3.4 (L) 3.5 - 5.0 g/dL   AST 20 15 -  41 U/L   ALT 15 0 - 44 U/L   Alkaline Phosphatase 82 38 - 126 U/L   Total Bilirubin 0.1 (L) 0.3 - 1.2 mg/dL   GFR, Estimated 59 (L) >60 mL/min    Comment: (NOTE) Calculated using the CKD-EPI Creatinine Equation (2021)    Anion gap 10 5 - 15    Comment: Performed at [facility], [address], [place], [place] [address]  Resp panel by RT-PCR (RSV, Flu A&B, Covid) Anterior Nasal Swab     Status: None   Collection Time: 07/16/23  8:48 PM   Specimen: Anterior Nasal Swab  Result Value Ref Range   SARS Coronavirus 2 by RT PCR NEGATIVE NEGATIVE    Comment:  (NOTE) SARS-CoV-2 target nucleic acids are NOT DETECTED.  The SARS-CoV-2 RNA is generally detectable in upper respiratory specimens during the acute phase of infection. The lowest concentration of SARS-CoV-2 viral copies this assay can detect is 138 copies/mL. A negative result does not preclude SARS-Cov-2 infection and should not be used as the sole basis for treatment or other patient management decisions. A negative result may occur with  improper specimen collection/handling, submission of specimen other than nasopharyngeal swab, presence of viral mutation(s) within the areas targeted by this assay, and inadequate number of viral copies(<138 copies/mL). A negative result must be combined with clinical observations, patient history, and epidemiological information. The expected result is Negative.  Fact Sheet for Patients:  BloggerCourse.com  Fact Sheet for Healthcare Providers:  SeriousBroker.it  This test is no t yet approved or cleared by the [place] FDA and  has been authorized for detection and/or diagnosis of SARS-CoV-2 by FDA under an Emergency Use Authorization (EUA). This EUA will remain  in effect (meaning this test can be used) for the duration of the COVID-19 declaration under Section 564(b)(1) of the Act, 21 U.S.C.section 360bbb-3(b)(1), unless the authorization is terminated  or revoked sooner.       Influenza A by PCR NEGATIVE NEGATIVE   Influenza B by PCR NEGATIVE NEGATIVE    Comment: (NOTE) The Xpert Xpress SARS-CoV-2/FLU/RSV plus assay is intended as an aid in the diagnosis of influenza from Nasopharyngeal swab specimens and should not be used as a sole basis for treatment. Nasal washings and aspirates are unacceptable for Xpert Xpress SARS-CoV-2/FLU/RSV testing.  Fact Sheet for Patients: BloggerCourse.com  Fact Sheet for Healthcare  Providers: SeriousBroker.it  This test is not yet approved or cleared by the [place] FDA and has been authorized for detection and/or diagnosis of SARS-CoV-2 by FDA under an Emergency Use Authorization (EUA). This EUA will remain in effect (meaning this test can be used) for the duration of the COVID-19 declaration under Section 564(b)(1) of the Act, 21 U.S.C. section 360bbb-3(b)(1), unless the authorization is terminated or revoked.     Resp Syncytial Virus by PCR NEGATIVE NEGATIVE    Comment: (NOTE) Fact Sheet for Patients: BloggerCourse.com  Fact Sheet for Healthcare Providers: SeriousBroker.it  This test is not yet approved or cleared by the [place] FDA and has been authorized for detection and/or diagnosis of SARS-CoV-2 by FDA under an Emergency Use Authorization (EUA). This EUA will remain in effect (meaning this test can be used) for the duration of the COVID-19 declaration under Section 564(b)(1) of the Act, 21 U.S.C. section 360bbb-3(b)(1), unless the authorization is terminated or revoked.  Performed at [facility], [address] [place], [place] [address]   Ethanol     Status: None  Collection Time: 07/16/23  8:48 PM  Result Value Ref Range   Alcohol, Ethyl (B) <10 <10 mg/dL    Comment: (NOTE) Lowest detectable limit for serum alcohol is 10 mg/dL.  For medical purposes only. Performed at [facility], [address] [place], [place] [address]   Urinalysis, Routine w reflex microscopic -Urine, Clean Catch     Status: Abnormal   Collection Time: 07/17/23  1:42 AM  Result Value Ref Range   Color, Urine YELLOW YELLOW   APPearance HAZY (A) CLEAR   Specific Gravity, Urine 1.025 1.005 - 1.030   pH 5.0 5.0 - 8.0   Glucose, UA NEGATIVE NEGATIVE mg/dL   Hgb urine dipstick NEGATIVE NEGATIVE   Bilirubin Urine NEGATIVE NEGATIVE   Ketones,  ur NEGATIVE NEGATIVE mg/dL   Protein, ur 30 (A) NEGATIVE mg/dL   Nitrite NEGATIVE NEGATIVE   Leukocytes,Ua NEGATIVE NEGATIVE   RBC / HPF 6-10 0 - 5 RBC/hpf   WBC, UA 11-20 0 - 5 WBC/hpf   Bacteria, UA RARE (A) NONE SEEN   Squamous Epithelial / HPF 6-10 0 - 5 /HPF   Mucus PRESENT    Hyaline Casts, UA PRESENT    Granular Casts, UA PRESENT     Comment: Performed at [facility], [address] [place], [place] [address]  Rapid urine drug screen (hospital performed)     Status: Abnormal   Collection Time: 07/17/23  1:43 AM  Result Value Ref Range   Opiates POSITIVE (A) NONE DETECTED   Cocaine POSITIVE (A) NONE DETECTED   Benzodiazepines NONE DETECTED NONE DETECTED   Amphetamines NONE DETECTED NONE DETECTED   Tetrahydrocannabinol POSITIVE (A) NONE DETECTED   Barbiturates NONE DETECTED NONE DETECTED    Comment: (NOTE) DRUG SCREEN FOR MEDICAL PURPOSES ONLY.  IF CONFIRMATION IS NEEDED FOR ANY PURPOSE, NOTIFY LAB WITHIN 5 DAYS.  LOWEST DETECTABLE LIMITS FOR URINE DRUG SCREEN Drug Class                     Cutoff (ng/mL) Amphetamine and metabolites    1000 Barbiturate and metabolites    200 Benzodiazepine                 200 Opiates and metabolites        300 Cocaine and metabolites        300 THC                            50 Performed at [facility], [address] [place], [place] [address]   Pregnancy, urine     Status: None   Collection Time: 07/17/23  4:40 AM  Result Value Ref Range   Preg Test, Ur NEGATIVE NEGATIVE    Comment:        THE SENSITIVITY OF THIS METHODOLOGY IS >25 mIU/mL. Performed at [facility], [address], [place], [place] [address]   Blood culture (routine x 2)     Status: None (Preliminary result)   Collection Time: 07/17/23  5:27 AM   Specimen: BLOOD  Result Value Ref Range   Specimen Description      BLOOD BLOOD RIGHT ARM Performed at [facility], [address], [place], [place] [address]    Special Requests      BOTTLES DRAWN AEROBIC AND ANAEROBIC Blood Culture adequate volume Performed at [facility], [address] [place], [place]  [address]    Culture      NO GROWTH < 12 HOURS Performed at [facility], [address] [place], [place] [address]    Report Status PENDING   Lactic acid, plasma     Status: None   Collection Time: 07/17/23  5:34 AM  Result Value Ref Range   Lactic Acid, Venous 1.2 0.5 - 1.9 mmol/L    Comment: Performed at [facility], [address] [place], [place] [address]  MRSA Next Gen by PCR, Nasal     Status: None   Collection Time: 07/17/23  8:17 AM   Specimen: Nasal Mucosa; Nasal Swab  Result Value Ref Range   MRSA by PCR Next Gen NOT DETECTED NOT DETECTED    Comment: (NOTE) The GeneXpert MRSA Assay (FDA approved for NASAL specimens only), is one component of a comprehensive MRSA colonization surveillance program. It is not intended to diagnose MRSA infection nor to guide or monitor treatment for MRSA infections. Test performance is not FDA approved in patients less than 29 years old. Performed at [facility], [address] [place], [place] [address]     Current Facility-Administered Medications  Medication Dose Route Frequency Provider Last Rate Last Admin   0.9 %  sodium chloride infusion   Intravenous Continuous Bobette Mo, MD 125 mL/hr at 07/17/23 0930 Rate Change at 07/17/23 0930   acetaminophen (TYLENOL) tablet 650 mg  650 mg Oral Q6H PRN Al Decant, PA-C   650 mg at 07/17/23 0555   Ampicillin-Sulbactam (UNASYN) 3 g in sodium chloride 0.9 % 100 mL IVPB  3 g Intravenous Q6H Bobette Mo, MD 200 mL/hr at 07/17/23 1114 3 g at 07/17/23 1114   Chlorhexidine Gluconate Cloth 2 % PADS 6 each  6 each Topical Daily Bobette Mo, MD   6 each at 07/17/23 0859   naloxone [facility]) injection 0.4 mg  0.4 mg Intravenous  PRN Smitty Knudsen, PA-C       ondansetron (ZOFRAN) tablet 4 mg  4 mg Oral Q6H PRN Bobette Mo, MD       Or   ondansetron [facility]) injection 4 mg  4 mg Intravenous Q6H PRN Bobette Mo, MD       Oral care mouth rinse  15 mL Mouth Rinse PRN Bobette Mo, MD       pantoprazole (PROTONIX) EC tablet 40 mg  40 mg Oral Daily Bobette Mo, MD   40 mg at 07/17/23 1122   sodium chloride 0.9 % bolus 1,000 mL  1,000 mL Intravenous Once PRN Bobette Mo, MD        Musculoskeletal: Strength & Muscle Tone: within normal limits Gait & Station: normal Patient leans: N/A     Psychiatric Specialty Exam:  Presentation  General Appearance:  Appropriate for Environment; Casual  Eye Contact: Good  Speech: Clear and Coherent; Normal Rate  Speech Volume: Normal  Handedness: Right   Mood and Affect  Mood: Dysphoric  Affect: Appropriate; Congruent   Thought Process  Thought Processes: Coherent; Linear  Descriptions of Associations:Intact  Orientation:Full (Time, Place and Person)  Thought Content:WDL  History of Schizophrenia/Schizoaffective disorder:No data recorded Duration of Psychotic Symptoms:No data recorded Hallucinations:Hallucinations: None  Ideas of Reference:None  Suicidal Thoughts:Suicidal Thoughts: No  Homicidal Thoughts:Homicidal Thoughts: No   Sensorium  Memory: Immediate Fair; Recent Fair; Remote Fair  Judgment: Fair  Insight: Fair   Art therapist  Concentration: Fair  Attention Span: Good  Recall: Dudley Major of Knowledge: Good  Language: Fair   Psychomotor Activity  Psychomotor Activity: Psychomotor Activity: Normal   Assets  Assets: Desire for Improvement; Communication Skills; Resilience; Social Support   Sleep  Sleep: Sleep: Fair   Physical Exam: Physical Exam Vitals and nursing note reviewed.  Constitutional:      Appearance: Normal appearance. She is normal weight.   HENT:     Mouth/Throat:     Dentition: Abnormal dentition.     Comments: Teeth have been removed, no dentures Skin:    Capillary Refill: Capillary refill takes less than 2 seconds.  Neurological:     General: No focal deficit present.     Mental Status: She is alert and oriented to person, place, and time. Mental status is at baseline.  Psychiatric:        Mood and Affect: Mood normal.        Behavior: Behavior normal.        Thought Content: Thought content normal.        Judgment: Judgment normal.    Review of Systems  Psychiatric/Behavioral:  The patient is nervous/anxious and has insomnia (stable with trazodone).   All other systems reviewed and are negative.  Blood pressure (!) 91/58, pulse 80, temperature 98.1 F (36.7 C), temperature source Oral, resp. rate 17, height 5\' 6"  (1.676 m), weight 63.5 kg, last menstrual period 07/02/2023, SpO2 97%. Body mass index is 22.6 kg/m.  Treatment Plan Summary: Plan   Overdose, Unintentional  -Refer to NA and ongoing rehab through [facility].  -Counseling and Therapy: Regular sessions with a therapist or counselor can help her work through any lingering issues related to her past substance use and support her in maintaining her sobriety.  Medication Management: May benefit from medications to support cravings.   Aftercare Planning: Address potential triggers and stressors that may arise in her daily life.  Monitor for Relapse Signs: Continue to assess her for any signs of potential relapse and provide strategies to cope with cravings or difficult situations. Although I do not suspect she is going to develop any, 1st use in 9 months.   Psychiatry to sign off! Disposition: No evidence of imminent risk to self or others at present.   Patient does not meet criteria for psychiatric inpatient admission. Supportive therapy provided about ongoing stressors. Discussed crisis plan, support from social network, calling 911, coming to the  Emergency Department, and calling Suicide Hotline.  Maryagnes Amos, FNP 07/17/2023 11:30 AM

## 2023-07-17 NOTE — ED Provider Notes (Signed)
  Physical Exam  BP (!) 90/56   Pulse 80   Temp 98.2 F (36.8 C) (Oral)   Resp 18   Ht 5\' 6"  (1.676 m)   Wt 63.5 kg   LMP 07/02/2023 (Exact Date) Comment: negative urine pregnancty test 07/16/23  SpO2 97%   BMI 22.60 kg/m   Physical Exam Vitals and nursing note reviewed.  Constitutional:      Appearance: Normal appearance.  HENT:     Head: Normocephalic and atraumatic.     Mouth/Throat:     Mouth: Mucous membranes are dry.  Eyes:     Pupils: Pupils are equal, round, and reactive to light.  Cardiovascular:     Rate and Rhythm: Normal rate.  Pulmonary:     Effort: Pulmonary effort is normal.  Abdominal:     General: Abdomen is flat.  Musculoskeletal:     Cervical back: Normal range of motion and neck supple.  Skin:    General: Skin is warm and dry.  Neurological:     Mental Status: She is alert and oriented to person, place, and time.     Procedures  Procedures  ED Course / MDM   Clinical Course as of 07/17/23 0732  Wed Jul 17, 2023  1610 Recently stopped menstrual cycle. [CG]    Clinical Course User Index [CG] Al Decant, PA-C   Medical Decision Making Amount and/or Complexity of Data Reviewed Labs: ordered. Radiology: ordered.  Risk OTC drugs. Prescription drug management. Decision regarding hospitalization.   Patient care assumed from Gelene Mink. PA at shift change, please see his note for a full HPI.Briefly, patient here s/p OD required narcan x2, hypoxic but no PE. Concern for aspiration pneumonia started on Unasyn,and admitted to hospitalist service. Patient hemodynamically stable for admission.     Portions of this note were generated with Scientist, clinical (histocompatibility and immunogenetics). Dictation errors may occur despite best attempts at proofreading.  Portions of this note were generated with Scientist, clinical (histocompatibility and immunogenetics). Dictation errors may occur despite best attempts at proofreading.         Claude Manges, PA-C 07/17/23 [id number]    Shon Baton,  MD 07/18/23 [phone]

## 2023-07-17 NOTE — TOC Initial Note (Signed)
Transition of Care [facility]) - Initial/Assessment Note   Patient Details  Name: Beth [name] MRN: [id number] Date of Birth: [birth year]  Transition of Care [facility]) CM/SW Contact:    Ewing Schlein, LCSW Phone Number: 07/17/2023, 12:56 PM  Clinical Narrative: TOC following for possible discharge needs.               Expected Discharge Plan: Home/Self Care Barriers to Discharge: Continued Medical Work up  Patient Goals and CMS Choice Choice offered to / list presented to : NA  Expected Discharge Plan and Services In-house Referral: Clinical Social Work Post Acute Care Choice: NA Living arrangements for the past 2 months: Single Family Home             DME Arranged: N/A DME Agency: NA  Prior Living Arrangements/Services Living arrangements for the past 2 months: Single Family Home Lives with:: Parents Patient language and need for interpreter reviewed:: Yes Need for Family Participation in Patient Care: Yes (Comment) Care giver support system in place?: Yes (comment) Criminal Activity/Legal Involvement Pertinent to Current Situation/Hospitalization: No - Comment as needed  Activities of Daily Living Home Assistive Devices/Equipment: Environmental consultant (specify type), Wheelchair ADL Screening (condition at time of admission) Patient's cognitive ability adequate to safely complete daily activities?: No Is the patient deaf or have difficulty hearing?: No Does the patient have difficulty seeing, even when wearing glasses/contacts?: No Does the patient have difficulty concentrating, remembering, or making decisions?: Yes Patient able to express need for assistance with ADLs?: No Does the patient have difficulty dressing or bathing?: No Independently performs ADLs?: No Communication: Independent Dressing (OT): Independent Grooming: Independent Feeding: Independent Bathing: Independent Toileting: Independent In/Out Bed: Independent Walks in Home: Independent Weakness of Legs: Both Weakness of  Arms/Hands: Both  Emotional Assessment Orientation: : Oriented to Self, Oriented to Place, Oriented to  Time, Oriented to Situation Alcohol / Substance Use: Illicit Drugs Psych Involvement: No (comment)  Admission diagnosis:  Hypoxia [R09.02] Acute respiratory failure with hypoxia (HCC) [J96.01] Accidental overdose of heroin, initial encounter (HCC) [T40.1X1A] Patient Active Problem List   Diagnosis Date Noted   Acute respiratory failure with hypoxia (HCC) 07/17/2023   Microcytic anemia 07/17/2023   Hyponatremia 07/17/2023   Thrombocytosis 07/17/2023   Leukocytosis 07/17/2023   Mild protein malnutrition (HCC) 07/17/2023   Aspiration pneumonia (HCC) 07/17/2023   Asymptomatic bacteriuria 07/17/2023   Pleuritic chest pain 07/17/2023   History of peptic ulcer disease 07/17/2023   Dysesthesia 03/12/2023   History of substance abuse (HCC) 03/12/2023   Sleep paralysis 12/21/2022   Anxiety disorder 08/30/2021   Opioid dependence with opioid-induced disorder (HCC) 07/18/2020   Mood disturbance 07/18/2020   Hypersomnia 03/17/2020   Numbness 03/17/2020   Gait disturbance 03/17/2020   High risk medication use 03/17/2020   Vitamin D deficiency 03/17/2020   Vitamin B12 deficiency 03/17/2020   Diplopia 03/17/2020   Opiate overdose (HCC)    Encephalopathy acute    Acute metabolic encephalopathy 08/17/2017   Heroin abuse (HCC) 08/17/2017   Tobacco abuse 08/17/2017   Hypotension 08/17/2017   Abnormal LFTs 08/17/2017   PTSD (post-traumatic stress disorder) 07/11/2015   Panic disorder without agoraphobia with severe panic attacks 05/26/2015   Major depressive disorder, recurrent episode (HCC) 05/23/2015   Suicidal ideations    Major depressive disorder, recurrent, severe without psychotic features (HCC)    Polysubstance dependence (HCC)    Substance induced mood disorder (HCC)    Substance abuse (HCC) 03/11/2015   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 [place] [address] Phone: [phone] Fax: [phone]  [facility] [address]  - [address] [place] [address] Phone: [phone] Fax: [phone]  Social Determinants of Health (SDOH) Social History: SDOH Screenings   Food Insecurity: No Food Insecurity (07/17/2023)  Housing: Low Risk  (07/17/2023)  Transportation Needs: No Transportation Needs (07/17/2023)  Utilities: Not At Risk (07/17/2023)  Financial Resource Strain: Medium Risk (11/02/2022)   Received from [facility], [facility]  Physical Activity: Insufficiently Active (07/04/2021)   Received from [facility], [facility]  Social Connections: Unknown (03/01/2022)   Received from [facility], [facility]  Stress: Stress Concern Present (07/04/2021)   Received from [facility], [facility]  Tobacco Use: Medium Risk (07/16/2023)   SDOH Interventions:    Readmission Risk Interventions     No data to display

## 2023-07-17 NOTE — H&P (Signed)
History and Physical    Patient: Beth [name] ZOX:[id number] DOB: 01-19-1981 DOA: 07/16/2023 DOS: the patient was seen and examined on 07/17/2023 PCP: Ladora Daniel, PA-C  Patient coming from: Home  Chief Complaint:  Chief Complaint  Patient presents with   Drug Overdose   HPI: Beth [name] is a 42 y.o. female with medical history significant of GERD, PUD/gastric ulcer, history of blood transfusion, anxiety, depression, insomnia, asthma, chronic bipolar disorder, chronic diarrhea, chronic nausea, fibromyalgia, alcohol abuse, history of delirium tremens, migraine headaches, multiple sclerosis, polysubstance abuse who was brought to the emergency department via EMS due to a drug overdose after being found out of [employer] parking lot at around 1840 yesterday evening by a friend who started to perform CPR and gave the patient 0.8 mg of Narcan IM prior to the ambulance crew arriving.  She received another 1.5 mg of naloxone by the EMS personnel.  She also transiently received CPR.  The patient uses heroin daily and admitted using earlier in the day.  She had to be put on NRB oxygen for a while but is currently breathing in the mid 20s with an O2 sat in the high 90s.  Apparently, now she has told the nursing staff that you may have been with intentions of self-harm.  She denied fever, chills, rhinorrhea, sore throat, wheezing or hemoptysis.  Positive musculoskeletal CP from CPR, but no palpitations, diaphoresis, PND, orthopnea or pitting edema of the lower extremities.  No abdominal pain, nausea, emesis, diarrhea, constipation, melena or hematochezia.  No flank pain, dysuria, frequency or hematuria.  No polyuria, polydipsia, polyphagia or blurred vision.   Lab work: Urinalysis showed protein of 30 mg/deciliter and rare bacteria.  UDS positive for opiates, cocaine and THC.  Coronavirus, influenza and RSV PCR were negative.  CBC showed white count 11.6, hemoglobin 8.7 g/dL with an MCV of 81.1 fL and platelets  496.  Urine pregnancy test was negative.  Alcohol level was less than 10 mg/dL.  CMP showed a sodium 134 mmol/L, the rest of the electrolytes were normal after calcium correction.  Albumin was 3.4 g/dL, creatinine 9.14 and total bilirubin 0.1 mg/deciliter.  Glucose, BUN and the rest of the LFTs were normal.  Imaging: Portable 1 view chest radiograph with mild basilar atelectasis.  CT head without contrast no acute intracranial normality.  Posterior scalp contusion.  No calvarial fracture.  CTA chest negative for PE.  Positive for multilobar bilateral mixed subsolid and solid lung opacity.  Differential considerations include aspiration, multifocal pneumonia or less likely asymmetric pulmonary edema.  No pleural effusion.  Possible superior associated nondisplaced anterior rib fractures.   ED course: Initial vital signs were temperature 98.3 F, pulse 105, respiration 20, BP 109/87 mmHg O2 sat 98% on NRB at 15 mL/h.  The patient received 3000 mL of normal saline bolus, acetaminophen 1000 mg p.o. x 1 and was started on Unasyn.  Review of Systems: As mentioned in the history of present illness. All other systems reviewed and are negative. Past Medical History:  Diagnosis Date   Acid reflux    Anxiety    Asthma    Chronic bipolar disorder (HCC)    2024   Chronic diarrhea    self reported   Chronic nausea    self reported   Chronic pain    self reported chronic pain, back pain, fibromyalgia   Delirium tremens (HCC)    Depression    ETOH abuse    Gastric ulcer  History of blood transfusion    Insomnia    Migraine    Multiple sclerosis (HCC) 2005   diagnosed in [place], failed steroids, no other medication   Polysubstance abuse (HCC)    Substance abuse (HCC)    Past Surgical History:  Procedure Laterality Date   BACK SURGERY     2022   DG TEETH FULL Bilateral    2024   DIAGNOSTIC LAPAROSCOPY  10/29/2001   repair of perforation at [facility] anastamosis    GASTRIC BYPASS     LAPAROTOMY N/A  08/18/2014   Procedure: EXPLORATORY LAPAROTOMY, Patch graft of gastric perforation;  Surgeon: Abigail Miyamoto, MD;  Location: [facility] OR;  Service: General;  Laterality: N/A;   LUMBAR LAMINECTOMY/DECOMPRESSION MICRODISCECTOMY Right 04/14/2014   Procedure: LUMBAR LAMINECTOMY/DECOMPRESSION MICRODISCECTOMY LEVEL L4-5;  Surgeon: Maeola Harman, MD;  Location: [facility];  Service: Neurosurgery;  Laterality: Right;  LUMBAR LAMINECTOMY/DECOMPRESSION MICRODISCECTOMY LEVEL L4-5   Social History:  reports that she quit smoking about 9 months ago. Her smoking use included cigarettes. She started smoking about 27 years ago. She has a 54 pack-year smoking history. She has never used smokeless tobacco. She reports current drug use. Drug: Cocaine. She reports that she does not drink alcohol.  Allergies  Allergen Reactions   Cymbalta [Duloxetine Hcl] Other (See Comments)    Made body jerk and twitch, spasms   Dilaudid [Hydromorphone Hcl] Other (See Comments)    Anger, aggression    Naltrexone Swelling   Nsaids Nausea And Vomiting   Zolpidem Other (See Comments)    Sleep walking   Abilify [Aripiprazole] Itching    Clenching of teeth, Grinds teeth   Meperidine     Mental Status Changes, Agitation, swearing   Prednisone Other (See Comments)    Steroids in general    Family History  Problem Relation Age of Onset   Hypertension Paternal Grandfather    Colon cancer Paternal Grandfather 99   Asthma Paternal Grandmother    Hypertension Paternal Grandmother    Breast cancer Paternal Grandmother    Cancer Paternal Grandmother        lung    Diabetes Maternal Grandmother    Diabetes Maternal Grandfather    Hypertension Father    Alcohol abuse Father    Urinary tract infection Mother    Stroke Mother 79       3 strokes     Prior to Admission medications   Medication Sig Start Date End Date Taking? Authorizing Provider  ALPRAZolam (XANAX) 1 MG tablet 1-2 tabs 30 mins before MRI and 1 extra at the time  of study if needed. 03/12/23   Sater, Pearletha Furl, MD  baclofen (LIORESAL) 10 MG tablet Take 10 mg by mouth 3 (three) times daily as needed. 11/02/22   [provider]  Buprenorphine HCl-Naloxone HCl 8-2 MG FILM Place 8 mg of opioid under the tongue daily. Patient not taking: Reported on 12/18/2022 12/17/22   [provider]  busPIRone (BUSPAR) 15 MG tablet Take 1 tablet (15 mg total) by mouth 2 (two) times daily. Patient taking differently: Take 30 mg by mouth 2 (two) times daily as needed. 07/18/20   Sater, Pearletha Furl, MD  clindamycin (CLEOCIN T) 1 % external solution Apply 1 Application topically 2 (two) times daily.    [provider]  cyclobenzaprine (FLEXERIL) 10 MG tablet Take 10 mg by mouth 3 (three) times daily as needed for muscle spasms.    [provider]  ferrous sulfate 325 (65 FE) MG  tablet Take 325 mg by mouth every other day. Patient not taking: Reported on 06/10/2023 08/17/21   [provider]  gabapentin (NEURONTIN) 600 MG tablet Take 600 mg by mouth 3 (three) times daily as needed.    [provider]  hydrOXYzine (ATARAX) 50 MG tablet Take 50 mg by mouth in the morning, at noon, in the evening, and at bedtime.    [provider]  lamoTRIgine (LAMICTAL) 25 MG tablet For 5 days, take 1 pill po qd, for next 5 days take 1 pill po bid, for next 5 days, take 1 pill in am and 2 po qhs, then take 2 pills po bid 03/12/23   Sater, Pearletha Furl, MD  linaclotide (LINZESS) 145 MCG CAPS capsule Take 72 mcg by mouth daily before breakfast.    [provider]  Lurasidone HCl (LATUDA) 60 MG TABS Take 80 mg by mouth daily at 6 (six) AM.    [provider]  norethindrone (AYGESTIN) 5 MG tablet Take 5 mg by mouth daily. Patient not taking: Reported on 12/18/2022    [provider]  omeprazole (PRILOSEC) 20 MG capsule Take 20 mg by mouth daily. Patient not taking: Reported on 03/12/2023    [provider]   ondansetron (ZOFRAN) 4 MG tablet Take 4 mg by mouth every 4 (four) hours as needed for nausea or vomiting. Patient not taking: Reported on 03/12/2023    [provider]  rizatriptan (MAXALT-MLT) 10 MG disintegrating tablet Take 10 mg by mouth as needed. 11/02/22   [provider]  traZODone (DESYREL) 100 MG tablet Take 100 mg by mouth at bedtime as needed. 30 minutes prior to HS 07/04/22   [provider]  valACYclovir (VALTREX) 500 MG tablet Take 500 mg by mouth daily as needed.    [provider]    Physical Exam: Vitals:   07/17/23 0615 07/17/23 0630 07/17/23 0645 07/17/23 0700  BP: (!) 93/55 (!) 90/55 (!) 93/59 (!) 90/56  Pulse: 82 79 79 80  Resp: 20 20 (!) 22 18  Temp:      TempSrc:      SpO2: 91% 95% 91% 97%  Weight:      Height:       Physical Exam Vitals and nursing note reviewed.  Constitutional:      General: She is awake. She is not in acute distress.    Appearance: Normal appearance. She is ill-appearing. She is not toxic-appearing.  HENT:     Head: Normocephalic.     Nose: No rhinorrhea.     Mouth/Throat:     Mouth: Mucous membranes are moist.  Eyes:     General: No scleral icterus.    Pupils: Pupils are equal, round, and reactive to light.  Neck:     Vascular: No JVD.  Cardiovascular:     Rate and Rhythm: Normal rate and regular rhythm.     Heart sounds: S1 normal and S2 normal.  Pulmonary:     Effort: Pulmonary effort is normal.     Breath sounds: Normal breath sounds. No wheezing, rhonchi or rales.  Abdominal:     General: Bowel sounds are normal. There is no distension.     Palpations: Abdomen is soft.     Tenderness: There is no abdominal tenderness.  Musculoskeletal:     Cervical back: Neck supple.     Right lower leg: No edema.     Left lower leg: No edema.  Skin:    General: Skin is  warm and dry.  Neurological:     General: No focal deficit present.     Mental Status: She is alert and oriented to person, place,  and time.  Psychiatric:        Mood and Affect: Mood normal.        Behavior: Behavior is cooperative.     Data Reviewed:  Results are pending, will review when available.  EKG: Vent. rate 110 BPM PR interval 154 ms QRS duration 88 ms QT/QTcB 339/459 ms P-R-T axes 80 91 59 Sinus tachycardia Biatrial enlargement Borderline right axis deviation RSR' in V1 or V2, probably normal variant  Assessment and Plan: Principal Problem:   Acute respiratory failure with hypoxia (HCC) In the setting of:   Opiate overdose (HCC) With superimposed:   Aspiration pneumonia (HCC)  Admit to SDU/inpatient. Continue supplemental oxygen. Scheduled and as needed bronchodilators. Continue Unasyn 3 g IVPB every 6 hours. Check sputum Gram stain, culture and sensitivity. Follow-up CBC and chemistry in the morning.  Active Problems:   Hypotension Has been responding to IV fluid boluses. Has been mentating appropriately. Added normal saline at 125 mL/h for maintenance. Low-dose midodrine if no response. If above fails: -Parenteral vasopressors as needed.    Opioid dependence with opioid-induced disorder [facility]) May resume Suboxone once hypotension resolves.    Major depressive disorder, recurrent episode (HCC)   Anxiety disorder She was seen by psychiatry earlier today. Case briefly discussed with them. -Will resume home meds. -Will follow the recommendations.    Pleuritic chest pain  Avoid narcotics. Acetaminophen as needed for now. Single dose ketorolac 15 mg IVP.    Microcytic anemia Has history of B12 anemia as well. Check anemia panel.    Thrombocytosis In the setting of anemia. Monitor platelet count.    Leukocytosis Monitor WBC.    Hyponatremia Minimal decrease. Unknown clinical significance. Will follow sodium level closely.    Asymptomatic bacteriuria  Follow-up urine culture and sensitivity sent by ED..    Multiple sclerosis (HCC) No significant symptomatology  at the moment. Follow-up with neurology as an outpatient.    Acid reflux/history of peptic ulcer disease Begin pantoprazole 40 mg p.o. daily.    Tobacco abuse Tobacco cessation advised. May use nicotine replacement therapy as needed.    Vitamin B12 deficiency Check B12 level.    Mild protein malnutrition (HCC) In the setting of polysubstance abuse. Will add protein supplementation.     Advance Care Planning:   Code Status: Full Code   Consults: Psychiatry.  Family Communication:   Severity of Illness: The appropriate patient status for this patient is INPATIENT. Inpatient status is judged to be reasonable and necessary in order to provide the required intensity of service to ensure the patient's safety. The patient's presenting symptoms, physical exam findings, and initial radiographic and laboratory data in the context of their chronic comorbidities is felt to place them at high risk for further clinical deterioration. Furthermore, it is not anticipated that the patient will be medically stable for discharge from the hospital within 2 midnights of admission.   * I certify that at the point of admission it is my clinical judgment that the patient will require inpatient hospital care spanning beyond 2 midnights from the point of admission due to high intensity of service, high risk for further deterioration and high frequency of surveillance required.*  Author: Bobette Mo, MD 07/17/2023 7:22 AM  For on call review [email or web address].   This document was prepared using Conservation officer, historic buildings  and may contain some unintended transcription errors.

## 2023-07-18 DIAGNOSIS — J9601 Acute respiratory failure with hypoxia: Secondary | ICD-10-CM | POA: Diagnosis not present

## 2023-07-18 LAB — COMPREHENSIVE METABOLIC PANEL
ALT: 11 U/L (ref 0–44)
AST: 14 U/L — ABNORMAL LOW (ref 15–41)
Albumin: 2 g/dL — ABNORMAL LOW (ref 3.5–5.0)
Alkaline Phosphatase: 56 U/L (ref 38–126)
Anion gap: 4 — ABNORMAL LOW (ref 5–15)
BUN: 15 mg/dL (ref 6–20)
CO2: 21 mmol/L — ABNORMAL LOW (ref 22–32)
Calcium: 7.9 mg/dL — ABNORMAL LOW (ref 8.9–10.3)
Chloride: 112 mmol/L — ABNORMAL HIGH (ref 98–111)
Creatinine, Ser: 0.8 mg/dL (ref 0.44–1.00)
GFR, Estimated: >60 mL/min (ref >60)
Glucose, Bld: 72 mg/dL (ref 70–99)
Potassium: 4.7 mmol/L (ref 3.5–5.1)
Sodium: 137 mmol/L (ref 135–145)
Total Bilirubin: 0.3 mg/dL (ref 0.3–1.2)
Total Protein: 4.9 g/dL — ABNORMAL LOW (ref 6.5–8.1)

## 2023-07-18 LAB — URINE CULTURE

## 2023-07-18 LAB — MAGNESIUM: Magnesium: 1.6 mg/dL — ABNORMAL LOW (ref 1.7–2.4)

## 2023-07-18 LAB — PHOSPHORUS: Phosphorus: 3.9 mg/dL (ref 2.5–4.6)

## 2023-07-18 MED ORDER — GABAPENTIN 300 MG PO CAPS
600.0000 mg | ORAL_CAPSULE | Freq: Every evening | ORAL | Status: DC | PRN
  Filled 2023-07-18: qty 2

## 2023-07-18 MED ORDER — AMOXICILLIN-POT CLAVULANATE 875-125 MG PO TABS: 1.0000 | ORAL_TABLET | Freq: Two times a day (BID) | ORAL | 0 refills | Status: DC

## 2023-07-18 MED ORDER — MAGNESIUM SULFATE 2 GM/50ML IV SOLN
2.0000 g | Freq: Once | INTRAVENOUS | Status: AC
  Administered 2023-07-18: 2 g via INTRAVENOUS
  Filled 2023-07-18: qty 50

## 2023-07-18 MED ORDER — LORAZEPAM 1 MG PO TABS
1.0000 mg | ORAL_TABLET | Freq: Once | ORAL | Status: AC
  Administered 2023-07-18: 1 mg via ORAL
  Filled 2023-07-18: qty 1

## 2023-07-18 MED ORDER — METHOCARBAMOL 500 MG PO TABS
500.0000 mg | ORAL_TABLET | Freq: Three times a day (TID) | ORAL | Status: DC | PRN
  Administered 2023-07-18: 500 mg via ORAL
  Filled 2023-07-18: qty 1

## 2023-07-18 MED ORDER — BUSPIRONE HCL 5 MG PO TABS: 30.0000 mg | ORAL_TABLET | Freq: Two times a day (BID) | ORAL | Status: DC | PRN

## 2023-07-18 MED ORDER — BUPROPION HCL ER (XL) 150 MG PO TB24
150.0000 mg | ORAL_TABLET | Freq: Every day | ORAL | Status: DC
  Administered 2023-07-18: 150 mg via ORAL
  Filled 2023-07-18: qty 1

## 2023-07-18 MED ORDER — GABAPENTIN 300 MG PO CAPS
600.0000 mg | ORAL_CAPSULE | Freq: Two times a day (BID) | ORAL | Status: DC
  Administered 2023-07-18: 600 mg via ORAL

## 2023-07-18 NOTE — Progress Notes (Signed)
PROGRESS NOTE    Beth [name]  LKG:[id number] DOB: 05-16-1981 DOA: 07/16/2023 PCP: Ladora Daniel, PA-C    Brief Narrative:  Beth [name] is a 42 y.o. female with medical history significant of GERD, PUD/gastric ulcer, history of blood transfusion, anxiety, depression, insomnia, asthma, chronic bipolar disorder, chronic diarrhea, chronic nausea, fibromyalgia, alcohol abuse, history of delirium tremens, migraine headaches, multiple sclerosis, polysubstance abuse who was brought to the emergency department via EMS due to a drug overdose after being found out of [employer] parking lot at around 1840 yesterday evening by a friend who started to perform CPR and gave the patient 0.8 mg of Narcan IM prior to the ambulance crew arriving.     Assessment and Plan: Acute respiratory failure with hypoxia (HCC) In the setting of:   Opiate overdose (HCC) With superimposed:   Aspiration pneumonia (HCC)  Admit to SDU/inpatient. -weaned off O2 -Scheduled and as needed bronchodilators. -Continue Unasyn 3 g IVPB every 6 hours. -home O2 eval     Hypotension -resolved     Opioid dependence with opioid-induced disorder (HCC) -not taking suboxone -avoid narcotics     Major depressive disorder, recurrent episode (HCC)   Anxiety disorder She was seen by psychiatry -Will resume home meds.      Pleuritic chest pain  Avoid narcotics. Acetaminophen as needed for now.     Microcytic anemia Has history of B12 anemia as well. -refused labs     Thrombocytosis In the setting of anemia.     Leukocytosis -trending down     Hyponatremia -resolved     Asymptomatic bacteriuria  -culture with multiple species     Multiple sclerosis (HCC) No significant symptomatology at the moment. Follow-up with neurology as an outpatient.     Acid reflux/history of peptic ulcer disease - pantoprazole 40 mg p.o. daily.     Tobacco abuse Tobacco cessation advised. May use nicotine replacement therapy as  needed.     Vitamin B12 deficiency - B12 level pending     Mild protein malnutrition (HCC) In the setting of polysubstance abuse. Will add protein supplementation.  Hypomagnesemia -repleted  DVT prophylaxis: SCDs Start: 07/17/23 6644    Code Status: Full Code   Disposition Plan:  Level of care: Med-Surg Status is: Inpatient     Consultants:  none   Subjective: Had been clean for a period of time prior to this episode  Objective: Vitals:   07/18/23 0802 07/18/23 0900 07/18/23 1000 07/18/23 1100  BP:  112/63 103/67 109/68  Pulse:  75 96 92  Resp:  17 20 17   Temp: 97.8 F (36.6 C)     TempSrc: Oral     SpO2:  97% 99% 100%  Weight:      Height:        Intake/Output Summary (Last 24 hours) at 07/18/2023 1136 Last data filed at 07/18/2023 0758 Gross per 24 hour  Intake 4803.29 ml  Output --  Net 4803.29 ml   Filed Weights   07/16/23 1956  Weight: 63.5 kg    Examination:   General: Appearance:    Well developed, well nourished female in no acute distress     Lungs:     Clear to auscultation bilaterally, respirations unlabored  Heart:    Normal heart rate. Normal rhythm. No murmurs, rubs, or gallops.    MS:   All extremities are intact.    Neurologic:   Awake, alert, oriented x 3. No apparent focal neurological  defect.        Data Reviewed: I have personally reviewed following labs and imaging studies  CBC: Recent Labs  Lab 07/16/23 2048  WBC 11.6*  NEUTROABS 10.1*  HGB 8.7*  HCT 29.5*  MCV 69.6*  PLT 496*   Basic Metabolic Panel: Recent Labs  Lab 07/16/23 2048 07/18/23 0338  NA 134* 137  K 4.2 4.7  CL 99 112*  CO2 25 21*  GLUCOSE 70 72  BUN 13 15  CREATININE 1.18* 0.80  CALCIUM 8.7* 7.9*  MG  --  1.6*  PHOS  --  3.9   GFR: Estimated Creatinine Clearance: 85.8 mL/min (by C-G formula based on SCr of 0.8 mg/dL). Liver Function Tests: Recent Labs  Lab 07/16/23 2048 07/18/23 0338  AST 20 14*  ALT 15 11  ALKPHOS  82 56  BILITOT 0.1* 0.3  PROT 7.5 4.9*  ALBUMIN 3.4* 2.0*   No results for input(s): "LIPASE", "AMYLASE" in the last 168 hours. No results for input(s): "AMMONIA" in the last 168 hours. Coagulation Profile: No results for input(s): "INR", "PROTIME" in the last 168 hours. Cardiac Enzymes: No results for input(s): "CKTOTAL", "CKMB", "CKMBINDEX", "TROPONINI" in the last 168 hours. BNP (last 3 results) No results for input(s): "PROBNP" in the last 8760 hours. HbA1C: No results for input(s): "HGBA1C" in the last 72 hours. CBG: No results for input(s): "GLUCAP" in the last 168 hours. Lipid Profile: No results for input(s): "CHOL", "HDL", "LDLCALC", "TRIG", "CHOLHDL", "LDLDIRECT" in the last 72 hours. Thyroid Function Tests: No results for input(s): "TSH", "T4TOTAL", "FREET4", "T3FREE", "THYROIDAB" in the last 72 hours. Anemia Panel: No results for input(s): "VITAMINB12", "FOLATE", "FERRITIN", "TIBC", "IRON", "RETICCTPCT" in the last 72 hours. Sepsis Labs: Recent Labs  Lab 07/17/23 0534  LATICACIDVEN 1.2    Recent Results (from the past 240 hour(s))  Resp panel by RT-PCR (RSV, Flu A&B, Covid) Anterior Nasal Swab     Status: None   Collection Time: 07/16/23  8:48 PM   Specimen: Anterior Nasal Swab  Result Value Ref Range Status   SARS Coronavirus 2 by RT PCR NEGATIVE NEGATIVE Final    Comment: (NOTE) SARS-CoV-2 target nucleic acids are NOT DETECTED.  The SARS-CoV-2 RNA is generally detectable in upper respiratory specimens during the acute phase of infection. The lowest concentration of SARS-CoV-2 viral copies this assay can detect is 138 copies/mL. A negative result does not preclude SARS-Cov-2 infection and should not be used as the sole basis for treatment or other patient management decisions. A negative result may occur with  improper specimen collection/handling, submission of specimen other than nasopharyngeal swab, presence of viral mutation(s) within the areas targeted  by this assay, and inadequate number of viral copies(<138 copies/mL). A negative result must be combined with clinical observations, patient history, and epidemiological information. The expected result is Negative.  Fact Sheet for Patients:  BloggerCourse.com  Fact Sheet for Healthcare Providers:  SeriousBroker.it  This test is no t yet approved or cleared by the [place] FDA and  has been authorized for detection and/or diagnosis of SARS-CoV-2 by FDA under an Emergency Use Authorization (EUA). This EUA will remain  in effect (meaning this test can be used) for the duration of the COVID-19 declaration under Section 564(b)(1) of the Act, 21 U.S.C.section 360bbb-3(b)(1), unless the authorization is terminated  or revoked sooner.       Influenza A by PCR NEGATIVE NEGATIVE Final   Influenza B by PCR NEGATIVE NEGATIVE Final    Comment: (NOTE) The  Xpert Xpress SARS-CoV-2/FLU/RSV plus assay is intended as an aid in the diagnosis of influenza from Nasopharyngeal swab specimens and should not be used as a sole basis for treatment. Nasal washings and aspirates are unacceptable for Xpert Xpress SARS-CoV-2/FLU/RSV testing.  Fact Sheet for Patients: BloggerCourse.com  Fact Sheet for Healthcare Providers: SeriousBroker.it  This test is not yet approved or cleared by the [place] FDA and has been authorized for detection and/or diagnosis of SARS-CoV-2 by FDA under an Emergency Use Authorization (EUA). This EUA will remain in effect (meaning this test can be used) for the duration of the COVID-19 declaration under Section 564(b)(1) of the Act, 21 U.S.C. section 360bbb-3(b)(1), unless the authorization is terminated or revoked.     Resp Syncytial Virus by PCR NEGATIVE NEGATIVE Final    Comment: (NOTE) Fact Sheet for Patients: BloggerCourse.com  Fact  Sheet for Healthcare Providers: SeriousBroker.it  This test is not yet approved or cleared by the [place] FDA and has been authorized for detection and/or diagnosis of SARS-CoV-2 by FDA under an Emergency Use Authorization (EUA). This EUA will remain in effect (meaning this test can be used) for the duration of the COVID-19 declaration under Section 564(b)(1) of the Act, 21 U.S.C. section 360bbb-3(b)(1), unless the authorization is terminated or revoked.  Performed at [facility], [address], [place], [place] [address]   Urine Culture (for pregnant, neutropenic or urologic patients or patients with an indwelling urinary catheter)     Status: Abnormal   Collection Time: 07/17/23  4:40 AM   Specimen: Urine, Clean Catch  Result Value Ref Range Status   Specimen Description   Final    URINE, CLEAN CATCH Performed at [facility], [address] [place], [place] [address]    Special Requests   Final    NONE Performed at [facility], [address], [place], [place] [address]    Culture MULTIPLE SPECIES PRESENT, SUGGEST RECOLLECTION (A)  Final   Report Status 07/18/2023 FINAL  Final  Blood culture (routine x 2)     Status: None (Preliminary result)   Collection Time: 07/17/23  5:27 AM   Specimen: BLOOD RIGHT ARM  Result Value Ref Range Status   Specimen Description   Final    BLOOD RIGHT ARM Performed at [facility], [address], [place], [place] [address]    Special Requests   Final    BOTTLES DRAWN AEROBIC AND ANAEROBIC Blood Culture adequate volume Performed at [facility], [address], [place], [place] [address]    Culture   Final    NO GROWTH 1 DAY Performed at [facility], [address], [place], [place] [address]    Report Status PENDING  Incomplete  MRSA Next Gen by PCR, Nasal     Status: None   Collection Time: 07/17/23  8:17 AM    Specimen: Nasal Mucosa; Nasal Swab  Result Value Ref Range Status   MRSA by PCR Next Gen NOT DETECTED NOT DETECTED Final    Comment: (NOTE) The GeneXpert MRSA Assay (FDA approved for NASAL specimens only), is one component of a comprehensive MRSA colonization surveillance program. It is not intended to diagnose MRSA infection nor to guide or monitor treatment for MRSA infections. Test performance is not FDA approved in patients less than 71 years old. Performed at [facility], [address], [place], [place] [address]  Radiology Studies: CT Angio Chest PE W and/or Wo Contrast  Result Date: 07/17/2023 CLINICAL DATA:  42 year old female found down in parking lot. Possible overdose. CPR. Low oxygen saturation. EXAM: CT ANGIOGRAPHY CHEST WITH CONTRAST TECHNIQUE: Multidetector CT imaging of the chest was performed using the standard protocol during bolus administration of intravenous contrast. Multiplanar CT image reconstructions and MIPs were obtained to evaluate the vascular anatomy. RADIATION DOSE REDUCTION: This exam was performed according to the departmental dose-optimization program which includes automated exposure control, adjustment of the mA and/or kV according to patient size and/or use of iterative reconstruction technique. CONTRAST:  75mL OMNIPAQUE IOHEXOL 350 MG/ML SOLN COMPARISON:  Portable chest x-ray 07/16/2023. CT Abdomen and Pelvis 08/22/2014. FINDINGS: Cardiovascular: Good contrast bolus timing in the pulmonary arterial tree. No pulmonary artery filling defect. Heart size within normal limits. No pericardial effusion. Some calcified coronary artery atherosclerosis suspected on series 10, image 153. Mediastinum/Nodes: No mediastinal mass or lymphadenopathy. Lungs/Pleura: Major airways are patent. There is widespread bilateral lower lobe and posterior and inferior left upper lobe peribronchial opacity which is a mix of ground-glass and early  consolidation. Similar findings in the medial segment of the right middle lobe. No pneumothorax or pleural effusion. Right lower lobe posterior basal segment peribronchial consolidation. Upper Abdomen: Chronic upper abdominal postoperative changes, probable gastric jejunostomy. Negative visible liver, gallbladder, spleen, pancreas, adrenal glands and left kidney. No upper abdominal free air or free fluid identified. Musculoskeletal: Advanced but chronic appearing T7-T8 disc and endplate degeneration. Suspicion of nondisplaced left anterior 3rd and 4th rib fractures. No other acute osseous abnormality identified. Review of the MIP images confirms the above findings. IMPRESSION: 1. Negative for pulmonary embolus. 2. Positive for multilobar bilateral mixed sub-solid and solid lung opacity. Differential considerations include aspiration, multifocal pneumonia, or less likely asymmetric pulmonary edema. No pleural effusion. 3. Possible CPR associated nondisplaced anterior rib fractures. Electronically Signed   By: Odessa Fleming M.D.   On: 07/17/2023 06:29   CT Head Wo Contrast  Result Date: 07/16/2023 CLINICAL DATA:  Patient arrives with [facility] after being picked up from [employer] parking lot at 1840 this evening. Patient was given 0.8 mg Narcan IM by friend prior to EMS arrival. EMS was called out earlier this afternoon around 1600; patient was unconscious then, given 1.5 mg Narcan from Fire. Friend was performing CPR prior to Fire arrival. Per EMS, patient fell out of vehicle during the earlier call and "has knot on back of head. EXAM: CT HEAD WITHOUT CONTRAST TECHNIQUE: Contiguous axial images were obtained from the base of the skull through the vertex without intravenous contrast. RADIATION DOSE REDUCTION: This exam was performed according to the departmental dose-optimization program which includes automated exposure control, adjustment of the mA and/or kV according to patient size and/or use of iterative reconstruction  technique. COMPARISON:  None Available. FINDINGS: Brain: No intracranial hemorrhage, mass effect, or evidence of acute infarct. No hydrocephalus. No extra-axial fluid collection. Vascular: No hyperdense vessel or unexpected calcification. Skull: No fracture or focal lesion.  Posterior scalp contusion. Sinuses/Orbits: No acute finding. Other: None. IMPRESSION: No acute intracranial abnormality. Posterior scalp contusion.  No calvarial fracture. Electronically Signed   By: Minerva Fester M.D.   On: 07/16/2023 21:50   DG Chest Portable 1 View  Result Date: 07/16/2023 CLINICAL DATA:  Recent overdose EXAM: PORTABLE CHEST 1 VIEW COMPARISON:  10/27/2015 FINDINGS: Cardiac shadow is within normal limits. Lungs are well aerated bilaterally. Minimal basilar atelectasis is noted. No bony abnormality is seen. IMPRESSION: Mild  basilar atelectasis. Electronically Signed   By: Alcide Clever M.D.   On: 07/16/2023 21:12        Scheduled Meds:  busPIRone  15 mg Oral TID   Chlorhexidine Gluconate Cloth  6 each Topical Daily   LORazepam  1 mg Oral Once   pantoprazole  40 mg Oral Daily   Continuous Infusions:  sodium chloride Stopped (07/18/23 0758)   ampicillin-sulbactam (UNASYN) IV 3 g (07/18/23 1121)     LOS: 1 day    Time spent: 45 minutes spent on chart review, discussion with nursing staff, consultants, updating family and interview/physical exam; more than 50% of that time was spent in counseling and/or coordination of care.    Joseph Art, DO [facility] Available via Epic secure chat 7am-7pm After these hours, please refer to coverage provider listed on [email or web address] 07/18/2023, 11:36 AM

## 2023-07-18 NOTE — Progress Notes (Addendum)
Patient arrived to 1604, I went in to do assessment, vital signs and patient was completely alert and oriented. Patient went to the bathroom and was in there for about 10 mins. Patient was found to be super drowsy and eyes rolling to the back of her head. She was asked if she has taken anything considering her history and she denied. She did report that mom had brought her some things to the room when she was in the ICU but did not say what those items were. Security, Water quality scientist, and [facility] notified. Tele sitter ordered and security compensated a large amount of needles, and heroin. Patient advised that she will not be permitted any visitors for her safety & Narcan administered. Patient woke up and was irritated about the tele sitter and left AMA. MD Benjamine Mola notified, abx prescription printed and patient walked herself down.

## 2023-07-18 NOTE — Discharge Summary (Signed)
AMA DISCHARGE SUMMARY   Patient caught using heroin in room.  Given narcan, tele sitter order placed and visitors restricted.  Patient then chose to leave AMA.  Script printed for oral abx (augmentin) to finish course started in hospital for aspiration PNA.  Marlin Canary DO

## 2023-07-22 LAB — CULTURE, BLOOD (ROUTINE X 2)
Culture: NO GROWTH
Special Requests: ADEQUATE

## 2023-08-21 ENCOUNTER — Other Ambulatory Visit: Payer: Self-pay | Admitting: Neurology

## 2023-08-22 NOTE — Telephone Encounter (Signed)
Last seen on 03/12/23  No follow up scheduled

## 2023-09-14 ENCOUNTER — Other Ambulatory Visit: Payer: Self-pay | Admitting: Neurology

## 2023-09-16 NOTE — Telephone Encounter (Signed)
Last seen on 03/12/23 No 3 month follow up scheduled

## 2023-09-22 ENCOUNTER — Other Ambulatory Visit: Payer: Self-pay | Admitting: Neurology

## 2023-09-23 NOTE — Telephone Encounter (Signed)
Rx was filled on 09/16/23.

## 2023-10-16 ENCOUNTER — Other Ambulatory Visit: Payer: Self-pay | Admitting: Neurology

## 2023-10-16 NOTE — Telephone Encounter (Signed)
Last seen on 03/12/23 Follow up scheduled on 11/15/23

## 2023-11-15 ENCOUNTER — Ambulatory Visit (INDEPENDENT_AMBULATORY_CARE_PROVIDER_SITE_OTHER): Payer: Medicare HMO | Admitting: Neurology

## 2023-11-15 ENCOUNTER — Encounter: Payer: Self-pay | Admitting: Neurology

## 2023-11-15 VITALS — BP 100/53 | HR 91 | Ht 66.0 in | Wt 146.0 lb

## 2023-11-15 DIAGNOSIS — F1911 Other psychoactive substance abuse, in remission: Secondary | ICD-10-CM | POA: Diagnosis not present

## 2023-11-15 DIAGNOSIS — F319 Bipolar disorder, unspecified: Secondary | ICD-10-CM

## 2023-11-15 DIAGNOSIS — R208 Other disturbances of skin sensation: Secondary | ICD-10-CM

## 2023-11-15 DIAGNOSIS — R269 Unspecified abnormalities of gait and mobility: Secondary | ICD-10-CM

## 2023-11-15 DIAGNOSIS — Z79899 Other long term (current) drug therapy: Secondary | ICD-10-CM

## 2023-11-15 DIAGNOSIS — G35 Multiple sclerosis: Secondary | ICD-10-CM | POA: Diagnosis not present

## 2023-11-15 MED ORDER — LAMOTRIGINE 100 MG PO TABS: ORAL_TABLET | ORAL | 11 refills | Status: AC

## 2023-11-15 NOTE — Progress Notes (Signed)
[facility]  PATIENT: Beth [name] DOB: 1981-04-11  REFERRING DOCTOR OR PCP:  Georgette Shell, PA-C SOURCE: Patient, notes from primary care, notes from [facility] neurology, imaging and lab reports, MRI images personally reviewed.  _________________________________   HISTORICAL  CHIEF COMPLAINT:  Chief Complaint  Patient presents with   Follow-up    Pt with mom, rm 10. Would like to discuss MRI results from July. States no issues or concerns. She was told last week that she is anemic.     HISTORY OF PRESENT ILLNESS:  Beth [name] is a 43 y.o. woman with relapsing remitting multiple sclerosis.   Update 11/15/2023 Since the last visit, she has had MRI of the brain and cervical spine.  She has been feeling more tired and recent labs show iron deficient anemia   MRI of the brain 05/05/2023 showed Scattered T2/FLAIR hyperintense foci in the cerebral hemispheres and foci in the left cerebellar hemisphere, left medulla and spinal cord in a pattern consistent with chronic demyelinating plaque associated with multiple sclerosis. None of the foci appear to be acute. No new lesions compared to the MRI from 06/02/2020.   MRI of the cervical spine 05/05/2023 showed There are T2 hyperintense foci within the spinal cord towards the right adjacent to C1-C2, posterolaterally to the left adjacent to C2, posteriorly adjacent to C3-C4, anteriorly adjacent to C5, and towards the left adjacent to C5-C6.  These are consistent with chronic demyelinating plaque associated with multiple sclerosis.  All of the foci were present on the MRI from 06/02/2020 and appear unchanged.    Mild disc degenerative changes at C4-C5 and minimal degenerative changes at C3-C4 and C5-C6 do not lead to spinal stenosis or nerve root compression.  These appear stable compared to the previous MRI. Labs: Vitamin B12 and copper were normal 03/17/2020  Currently, she reports that her gait is off balance but she has no falls.   She notes tingling in the feet but its less bothersome with higher dose of lamotrigine.   She will use a walker at times to walk when more tired.   Left leg is weaker than right.   She notes a left foot drop.  She notes numbness/pain in the bottom of her feet and the left anterolateral thigh.  She notes muscle spasms in her limbs at times, especially the upper left arm.  d   Bladder issues worsened in 2022/2023 but not associated with her discitis event.    She denies new issues with visual acuity though she often uses a magnifying glass and  has intermittent diplopia.  She needs magnifying .  Diplopia resolved.  She has fatigue.  She was briefly on modafinil but it did not help.  She notes reduced focus and attention and sometimes feels in a fog.  Sleep is variable - better with gabapentin and trazodone at nigh..  She has been diagnosed with bipolar disease and personality disorder and anxiety. Seroquel, olanzapine, Risperdal and Abilify were poorly tolerated.   She was once on lamotrigine.   Buspar helps anxiety.    She reported that Zoloft made her more manic.  She sees Dr Tomasa Hose for psychiatry (Sees Dr. Tomasa Hose at [facility]).   He has increased Latuda recently.     MS history She was diagnosed with MS in 2001 after presenting with clumsiness and numbness.  I had seen her in 2021 after she presented with an exacerbation causing diplopia she had not been on a disease modifying therapy for  couple years and Tecfidera was started but she took just a couple months..    Earlier 2024, she had the onset of right foot numbness and foot drop.    She developed discitis at T7-T8 in 2022 and was placed on IV antibiotics with resolution.   She has a substance abuse problem (heroin).  Clean since earlier this year  MS History: She was diagnosed with MS in 2001.   At that time, at age 12, she had numbness and reduced dexterity in the hands causing reduced typing. Her right hand sometimes jerked and she  had slurred speech.   An MRI was consistent with MS and she had IV steroids.   She had an ulcer develop.  She was started on Rebif.   Due to side effects she stopped.   She had a few exacerbations over the years. She had a lot going on with her sister committing suicide and she becoming an alcoholic.  For many years she did not see any neurologist . Around 2014, she started to have more difficulty with balance and gait .  She had a lumbar fracture after a fall in 2016.   Repeat imaging was performed and she was referred to Dr. Gaynelle Adu at [facility].   She was placed on Tecfidera but stopped due to hair loss and flushing.  She stopped in 2018.  She developed diplopia spring 2021 I had seen her in early 2021 and we tried to get her back on Tecfidera.  Other pertinent History: She has a h/o gastric bypass in 2002.    She lost a lot of weight and gained back quite a bit.  Over the last 2 years, she has lost her weight (on Topamax at the time)  but is steady at 130 pounds  She has chronic Hepatitis C and has an appointment to see GI soon.  She is currently on methadone 120 mg daily for pain but also had an opiate drug addiction in the past.  She does have degenerative changes in her lumbar spine causing back and leg pain.  She had right hemilaminectomy at L4-L5 due to a disc herniation.  IMAGING REVIEW MRI of the brain dated 09/16/2015.  It shows T2/FLAIR hyperintense foci in the cerebellar hemisphere, left medulla, and in the periventricular and juxtacortical white matter.  None of the foci enhanced.  MRI of the cervical spine showed T2 hyperintense foci to the right at C1-C2, posteriorly at C2, small focus to the left of C5 and anteriorly to the right at C5-C6.  MRI brain 06/02/2020 showed Multiple T2/FLAIR hyperintense foci in the hemispheres, cerebellum and medulla in a pattern and configuration consistent with chronic demyelinating plaque associated with multiple sclerosis.  None of the foci appear to  be acute and they do not enhance.  Compared to the MRI dated 09/16/2015, there are no new lesions.       MRI cervical spine 06/02/2020 showed several T2 hyperintense foci within the spinal cord   There are multiple T2 hyperintense foci within the spinal cord located towards the right adjacent to C1-C2, posteriorly adjacent to C1-C2, anteriorly adjacent to C5 and laterally to the left adjacent to C5-C6. None of these foci enhance. They were all present on the MRI from 2016. consistent with stable chronic demyelinating plaque associated with multiple sclerosis.  Mild disc degenerative changes at C4-C5 and C5-C6 that do not lead to spinal stenosis or nerve root compression.  The degenerative changes have progressed compared to the previous  MR.  MRI of the brain 05/05/2023 showed Scattered T2/FLAIR hyperintense foci in the cerebral hemispheres and foci in the left cerebellar hemisphere, left medulla and spinal cord in a pattern consistent with chronic demyelinating plaque associated with multiple sclerosis. None of the foci appear to be acute. No new lesions compared to the MRI from 06/02/2020.   MRI of the cervical spine 05/05/2023 showed There are T2 hyperintense foci within the spinal cord towards the right adjacent to C1-C2, posterolaterally to the left adjacent to C2, posteriorly adjacent to C3-C4, anteriorly adjacent to C5, and towards the left adjacent to C5-C6.  These are consistent with chronic demyelinating plaque associated with multiple sclerosis.  All of the foci were present on the MRI from 06/02/2020 and appear unchanged.    Mild disc degenerative changes at C4-C5 and minimal degenerative changes at C3-C4 and C5-C6 do not lead to spinal stenosis or nerve root compression.  These appear stable compared to the previous MRI. Labs: Vitamin B12 and copper were normal 03/17/2020  REVIEW OF SYSTEMS: Constitutional: No fevers, chills, sweats, or change in appetite.  She has fatigue. Eyes: No visual changes,  double vision, eye pain Ear, nose and throat: No hearing loss, ear pain, nasal congestion, sore throat Cardiovascular: No chest pain, palpitations Respiratory:  No shortness of breath at rest or with exertion.   No wheezes GastrointestinaI: No nausea, vomiting, diarrhea, abdominal pain, fecal incontinence.  She has asymptomatic chronic hepatitis C Genitourinary:  No dysuria, urinary retention or frequency.  No nocturia. Musculoskeletal: She reports lower back and leg pain. Integumentary: No rash, pruritus, skin lesions Neurological: as above Psychiatric: No depression at this time.  No anxiety Endocrine: No palpitations, diaphoresis, change in appetite, change in weigh or increased thirst Hematologic/Lymphatic:  No anemia, purpura, petechiae. Allergic/Immunologic: No itchy/runny eyes, nasal congestion, recent allergic reactions, rashes  ALLERGIES: Allergies  Allergen Reactions   Cymbalta [Duloxetine Hcl] Other (See Comments)    Made body jerk and twitch, spasms   Dilaudid [Hydromorphone Hcl] Other (See Comments)    Anger, aggression    Naltrexone Other (See Comments)    "Everything looked to be in 3-D"   Nsaids Nausea And Vomiting and Other (See Comments)    GI Intolerance   Olanzapine Other (See Comments)    Agitation, Dizziness   Zolpidem Other (See Comments)    Sleep walking   Abilify [Aripiprazole] Itching and Other (See Comments)    Clenching of teeth, Grinds teeth too   Augmentin [Amoxicillin-Pot Clavulanate] Other (See Comments)    Caused confusion   Meperidine Other (See Comments)    Mental Status Changes, Agitation, swearing   Suboxone [Buprenorphine Hcl-Naloxone Hcl] Other (See Comments)    "Everything looked to be super-bright and in 3-D"   Prednisone Other (See Comments)    Steroids in general, too    HOME MEDICATIONS:  Current Outpatient Medications:    baclofen (LIORESAL) 10 MG tablet, Take 10 mg by mouth 3 (three) times daily as needed for muscle spasms.,  Disp: , Rfl:    buPROPion (WELLBUTRIN XL) 150 MG 24 hr tablet, Take 150 mg by mouth daily., Disp: , Rfl:    busPIRone (BUSPAR) 30 MG tablet, Take 30 mg by mouth 2 (two) times daily as needed (for unresolved anxiety)., Disp: , Rfl:    cyclobenzaprine (FLEXERIL) 10 MG tablet, Take 10 mg by mouth See admin instructions. Take 10 mg by mouth two to three times a day as needed for muscle spasms, Disp: , Rfl:  gabapentin (NEURONTIN) 300 MG capsule, Take 600 mg by mouth See admin instructions. Take 600 mg by mouth in the morning and afternoon- may take an additional 600 mg at bedtime as needed for  MS-related pain, Disp: , Rfl:    hydrOXYzine (ATARAX) 50 MG tablet, Take 50 mg by mouth every 6 (six) hours as needed for anxiety., Disp: , Rfl:    lamoTRIgine (LAMICTAL) 25 MG tablet, THEN TAKE 2 PILLS TWICE A DAY, Disp: 120 tablet, Rfl: 0   LURASIDONE HCL PO, Take 100 mg by mouth daily with breakfast., Disp: , Rfl:    ondansetron (ZOFRAN-ODT) 4 MG disintegrating tablet, Take 4 mg by mouth every 6 (six) hours as needed for nausea (dissolve orally)., Disp: , Rfl:    rizatriptan (MAXALT-MLT) 10 MG disintegrating tablet, Take 10 mg by mouth as needed for migraine (dissolve orally- may take an aditional 10 mg after two hours, if no relief (MAX of 20 MG/24 HOURS))., Disp: , Rfl:    traZODone (DESYREL) 100 MG tablet, Take 50 mg by mouth at bedtime as needed for sleep (30 minutes prior to bedtime)., Disp: , Rfl:    TYLENOL 500 MG tablet, Take 500-1,000 mg by mouth every 6 (six) hours as needed for mild pain or headache., Disp: , Rfl:   PAST MEDICAL HISTORY: Past Medical History:  Diagnosis Date   Acid reflux    Anxiety    Asthma    Chronic bipolar disorder (HCC)    2024   Chronic diarrhea    self reported   Chronic nausea    self reported   Chronic pain    self reported chronic pain, back pain, fibromyalgia   Delirium tremens (HCC)    Depression    ETOH abuse    Gastric ulcer    History of blood  transfusion    Insomnia    Migraine    Multiple sclerosis (HCC) 2005   diagnosed in [place], failed steroids, no other medication   Polysubstance abuse (HCC)    Substance abuse (HCC)     PAST SURGICAL HISTORY: Past Surgical History:  Procedure Laterality Date   BACK SURGERY     2022   DG TEETH FULL Bilateral    2024   DIAGNOSTIC LAPAROSCOPY  10/29/2001   repair of perforation at [facility] anastamosis    GASTRIC BYPASS     LAPAROTOMY N/A 08/18/2014   Procedure: EXPLORATORY LAPAROTOMY, Patch graft of gastric perforation;  Surgeon: Abigail Miyamoto, MD;  Location: [facility] OR;  Service: General;  Laterality: N/A;   LUMBAR LAMINECTOMY/DECOMPRESSION MICRODISCECTOMY Right 04/14/2014   Procedure: LUMBAR LAMINECTOMY/DECOMPRESSION MICRODISCECTOMY LEVEL L4-5;  Surgeon: Maeola Harman, MD;  Location: [facility];  Service: Neurosurgery;  Laterality: Right;  LUMBAR LAMINECTOMY/DECOMPRESSION MICRODISCECTOMY LEVEL L4-5    FAMILY HISTORY: Family History  Problem Relation Age of Onset   Hypertension Paternal Grandfather    Colon cancer Paternal Grandfather 48   Asthma Paternal Grandmother    Hypertension Paternal Grandmother    Breast cancer Paternal Grandmother    Cancer Paternal Grandmother        lung    Diabetes Maternal Grandmother    Diabetes Maternal Grandfather    Hypertension Father    Alcohol abuse Father    Urinary tract infection Mother    Stroke Mother 2       3 strokes     SOCIAL HISTORY:  Social History   Socioeconomic History   Marital status: Legally Separated    Spouse name: Fulton Mole   Number  of children: 0   Years of education: 12   Highest education level: Not on file  Occupational History   Occupation: disability  Tobacco Use   Smoking status: Former    Current packs/day: 0.00    Average packs/day: 2.0 packs/day for 27.0 years (54.0 ttl pk-yrs)    Types: Cigarettes    Start date: 09/29/1995    Quit date: 09/28/2022    Years since quitting: 1.1   Smokeless tobacco:  Never  Vaping Use   Vaping status: Every Day   Substances: Nicotine  Substance and Sexual Activity   Alcohol use: No    Alcohol/week: 0.0 standard drinks of alcohol    Comment: patient states she does not drink any more.   Drug use: Yes    Types: Cocaine    Comment: heroin for "pain   Sexual activity: Yes    Birth control/protection: None, Condom  Other Topics Concern   Not on file  Social History Narrative   Lives with mother, Fulton Mole   Caffeine use: soda (3-4 per day)   No coffee      Right handed    Social Drivers of Health   Financial Resource Strain: Medium Risk (11/02/2022)   [facility] from [facility], [facility]   Overall Financial Resource Strain (CARDIA)    Difficulty of Paying Living Expenses: Somewhat hard  Food Insecurity: No Food Insecurity (07/17/2023)   Hunger Vital Sign    Worried About Running Out of Food in the Last Year: Never true    Ran Out of Food in the Last Year: Never true  Transportation Needs: No Transportation Needs (07/17/2023)   PRAPARE - Administrator, Civil Service (Medical): No    Lack of Transportation (Non-Medical): No  Physical Activity: Insufficiently Active (07/04/2021)   Received from [facility], [facility]   Exercise Vital Sign    Days of Exercise per Week: 3 days    Minutes of Exercise per Session: 30 min  Stress: Stress Concern Present (07/04/2021)   Received from [facility], [facility] - Occupational Stress Questionnaire    Feeling of Stress : Rather much  Social Connections: Unknown (03/01/2022)   Received from [facility], [facility]   Social Network    Social Network: Not on file  Intimate Partner Violence: Not At Risk (07/17/2023)   Humiliation, Afraid, Rape, and Kick questionnaire    Fear of Current or Ex-Partner: No    Emotionally Abused: No    Physically Abused: No    Sexually Abused: No     PHYSICAL EXAM  Vitals:   11/15/23 0817  BP: (!)  100/53  Pulse: 91  Weight: 146 lb (66.2 kg)  Height: 5\' 6"  (1.676 m)   OD 20/50 OS 20/40  Body mass index is 23.57 kg/m.  No results found.   General: The patient is well-developed and well-nourished and in no acute distress.      HEENT:  Head is [place]/AT.  Sclera are anicteric.    Skin/Ext: Extremities are without rash or edema.  Shows changes from her previous IV drug abuse      Neurologic Exam  Mental status: The patient is alert and oriented x 3 at the time of the examination. The patient has apparent normal recent and remote memory, with an apparently normal attention span and concentration ability.   Normal palatal elevatin, tongue protrusion.  No dysarthriaSpeech is normal.  Cranial nerves: Extraocular movements are full.  Color  vision was reduced on the left.  Facial strength is normal. No obvious hearing deficits are noted.  Motor:  Muscle bulk is normal.   Tone is mildly increased in legs. Strength is  5 / 5 in all 4 extremities.   Sensory: Sensory testing is intact to touch and vibration sensation in arms but allodynia in feet, left >>right and left anterolateral thigh (associated with some reduced touch)  Coordination: Cerebellar testing reveals slight reduced good finger-nose-finger and mild reduced heel-to-shin bilaterally.  Gait and station: Station is normal.   She was able to be tested without a cane.  Gait is mildly wide.  Her turn was wide.  She has wide tandem gait.. Romberg is negative.   Reflexes: Deep tendon reflexes are symmetric and normal in arms, 3+ (spread) at knees and 3 at ankles (no clonus).   Plantar responses are flexor.    DIAGNOSTIC DATA (LABS, IMAGING, TESTING) - I reviewed patient records, labs, notes, testing and imaging myself where available.  Lab Results  Component Value Date   WBC 11.6 (H) 07/16/2023   HGB 8.7 (L) 07/16/2023   HCT 29.5 (L) 07/16/2023   MCV 69.6 (L) 07/16/2023   PLT 496 (H) 07/16/2023      Component Value  Date/Time   NA 137 07/18/2023 0338   NA 145 (H) 03/17/2020 1002   K 4.7 07/18/2023 0338   CL 112 (H) 07/18/2023 0338   CO2 21 (L) 07/18/2023 0338   GLUCOSE 72 07/18/2023 0338   BUN 15 07/18/2023 0338   BUN 8 03/17/2020 1002   CREATININE 0.80 07/18/2023 0338   CALCIUM 7.9 (L) 07/18/2023 0338   PROT 4.9 (L) 07/18/2023 0338   PROT 7.4 03/17/2020 1002   ALBUMIN 2.0 (L) 07/18/2023 0338   ALBUMIN 4.3 03/17/2020 1002   AST 14 (L) 07/18/2023 0338   ALT 11 07/18/2023 0338   ALKPHOS 56 07/18/2023 0338   BILITOT 0.3 07/18/2023 0338   BILITOT 0.5 03/17/2020 1002   GFRNONAA >60 07/18/2023 0338   GFRAA 112 03/17/2020 1002   Lab Results  Component Value Date   CHOL 203 (H) 05/24/2015   HDL 61 05/24/2015   LDLCALC 120 (H) 05/24/2015   TRIG 112 05/24/2015   CHOLHDL 3.3 05/24/2015   Lab Results  Component Value Date   HGBA1C 5.2 05/24/2015   Lab Results  Component Value Date   VITAMINB12 349 03/17/2020   Lab Results  Component Value Date   TSH 1.649 01/13/2015       ASSESSMENT AND PLAN  Multiple sclerosis (HCC)  History of substance abuse (HCC)  Bipolar disease, chronic (HCC)  High risk medication use  Gait disturbance  Dysesthesia   In summary, Ms. Cowin is a 44 year old woman with a long history of multiple sclerosis, bipolar disease and substance abuse.  She had a clear-cut MS exacerbation in 2021 and a probable MS exacerbation earlier this year.  1.   MRIs that show that her MS is fairly stable with no changes over 3 years.  However, I still think given her age that she needs to be on a disease modifying therapy.    Due to her history of substance abuse (heroin) we will avoid a self injectable medication.  She has poor venous access making IV medications difficult.  We discussed the oral options.  Mavenclad would give her a good combination of strength and tolerability.  She had tolerability issues with Tecfidera in the past.  She is going to give this more thought.  She was just diagnosed with anemia and she would like to get that under control before she starts an MS medication.  I have asked them to let me know when the anemia is under control so we can get the Mavenclad started. 2.   To help with the dysesthesias increase lamotrigine.  This may also help as a mood stabilizer.   3.   Stay active and exercise 4.   Due to a combination of physical and cognitive impairments from the multiple sclerosis and her psychiatric disease (bipolar disease), she is disabled and would not be able to find gainful employment..  RTC 6 months, sooner if new or worsening issues.     This visit is part of a comprehensive longitudinal care medical relationship regarding the patients primary diagnosis of MS and related concerns.    Amayia Ciano A. Epimenio Foot, MD, [facility] 11/15/2023, 8:26 AM Certified in Neurology, Clinical Neurophysiology, Sleep Medicine and Neuroimaging  [facility] [address], [address] [place], [place] [address] [phone]

## 2024-06-09 ENCOUNTER — Ambulatory Visit: Payer: Medicare HMO | Admitting: Neurology
